# Patient Record
Sex: Female | Born: 1955 | Race: White | Hispanic: No | Smoking: Former smoker
Health system: Southern US, Community
[De-identification: ages and names within clinical notes are randomized; demographics above are authoritative.]

## PROBLEM LIST (undated history)

## (undated) DIAGNOSIS — I714 Abdominal aortic aneurysm, without rupture, unspecified: Secondary | ICD-10-CM

## (undated) DIAGNOSIS — I200 Unstable angina: Secondary | ICD-10-CM

## (undated) DIAGNOSIS — I5189 Other ill-defined heart diseases: Secondary | ICD-10-CM

## (undated) DIAGNOSIS — R112 Nausea with vomiting, unspecified: Secondary | ICD-10-CM

## (undated) DIAGNOSIS — I2510 Atherosclerotic heart disease of native coronary artery without angina pectoris: Secondary | ICD-10-CM

## (undated) DIAGNOSIS — R590 Localized enlarged lymph nodes: Secondary | ICD-10-CM

## (undated) DIAGNOSIS — G43909 Migraine, unspecified, not intractable, without status migrainosus: Secondary | ICD-10-CM

## (undated) DIAGNOSIS — M51369 Other intervertebral disc degeneration, lumbar region without mention of lumbar back pain or lower extremity pain: Secondary | ICD-10-CM

## (undated) DIAGNOSIS — T8859XA Other complications of anesthesia, initial encounter: Secondary | ICD-10-CM

## (undated) DIAGNOSIS — J449 Chronic obstructive pulmonary disease, unspecified: Secondary | ICD-10-CM

## (undated) DIAGNOSIS — Z955 Presence of coronary angioplasty implant and graft: Secondary | ICD-10-CM

## (undated) DIAGNOSIS — Z7982 Long term (current) use of aspirin: Secondary | ICD-10-CM

## (undated) DIAGNOSIS — M5126 Other intervertebral disc displacement, lumbar region: Secondary | ICD-10-CM

## (undated) DIAGNOSIS — C3490 Malignant neoplasm of unspecified part of unspecified bronchus or lung: Secondary | ICD-10-CM

## (undated) DIAGNOSIS — I719 Aortic aneurysm of unspecified site, without rupture: Secondary | ICD-10-CM

## (undated) DIAGNOSIS — I6523 Occlusion and stenosis of bilateral carotid arteries: Secondary | ICD-10-CM

## (undated) DIAGNOSIS — K5792 Diverticulitis of intestine, part unspecified, without perforation or abscess without bleeding: Secondary | ICD-10-CM

## (undated) DIAGNOSIS — Z87442 Personal history of urinary calculi: Secondary | ICD-10-CM

## (undated) DIAGNOSIS — F988 Other specified behavioral and emotional disorders with onset usually occurring in childhood and adolescence: Secondary | ICD-10-CM

## (undated) DIAGNOSIS — D472 Monoclonal gammopathy: Secondary | ICD-10-CM

## (undated) DIAGNOSIS — I471 Supraventricular tachycardia, unspecified: Secondary | ICD-10-CM

## (undated) DIAGNOSIS — Z9889 Other specified postprocedural states: Secondary | ICD-10-CM

## (undated) DIAGNOSIS — I700 Atherosclerosis of aorta: Secondary | ICD-10-CM

## (undated) DIAGNOSIS — I10 Essential (primary) hypertension: Secondary | ICD-10-CM

## (undated) DIAGNOSIS — M810 Age-related osteoporosis without current pathological fracture: Secondary | ICD-10-CM

## (undated) DIAGNOSIS — R918 Other nonspecific abnormal finding of lung field: Secondary | ICD-10-CM

## (undated) DIAGNOSIS — F32A Depression, unspecified: Secondary | ICD-10-CM

## (undated) DIAGNOSIS — I519 Heart disease, unspecified: Secondary | ICD-10-CM

## (undated) DIAGNOSIS — E785 Hyperlipidemia, unspecified: Secondary | ICD-10-CM

## (undated) DIAGNOSIS — R55 Syncope and collapse: Secondary | ICD-10-CM

## (undated) DIAGNOSIS — F419 Anxiety disorder, unspecified: Secondary | ICD-10-CM

## (undated) DIAGNOSIS — C801 Malignant (primary) neoplasm, unspecified: Secondary | ICD-10-CM

## (undated) DIAGNOSIS — K739 Chronic hepatitis, unspecified: Secondary | ICD-10-CM

## (undated) DIAGNOSIS — Z7901 Long term (current) use of anticoagulants: Secondary | ICD-10-CM

## (undated) DIAGNOSIS — I739 Peripheral vascular disease, unspecified: Secondary | ICD-10-CM

## (undated) DIAGNOSIS — I219 Acute myocardial infarction, unspecified: Secondary | ICD-10-CM

## (undated) DIAGNOSIS — Z87891 Personal history of nicotine dependence: Secondary | ICD-10-CM

## (undated) DIAGNOSIS — I7401 Saddle embolus of abdominal aorta: Secondary | ICD-10-CM

## (undated) DIAGNOSIS — R0600 Dyspnea, unspecified: Secondary | ICD-10-CM

## (undated) DIAGNOSIS — M5136 Other intervertebral disc degeneration, lumbar region: Secondary | ICD-10-CM

## (undated) DIAGNOSIS — G894 Chronic pain syndrome: Secondary | ICD-10-CM

## (undated) DIAGNOSIS — G4700 Insomnia, unspecified: Secondary | ICD-10-CM

## (undated) DIAGNOSIS — Z972 Presence of dental prosthetic device (complete) (partial): Secondary | ICD-10-CM

## (undated) DIAGNOSIS — F319 Bipolar disorder, unspecified: Secondary | ICD-10-CM

## (undated) DIAGNOSIS — F329 Major depressive disorder, single episode, unspecified: Secondary | ICD-10-CM

## (undated) DIAGNOSIS — M47816 Spondylosis without myelopathy or radiculopathy, lumbar region: Secondary | ICD-10-CM

## (undated) HISTORY — DX: Major depressive disorder, single episode, unspecified: F32.9

## (undated) HISTORY — PX: COLONOSCOPY: SHX174

## (undated) HISTORY — DX: Anxiety disorder, unspecified: F41.9

## (undated) HISTORY — PX: OTHER SURGICAL HISTORY: SHX169

## (undated) HISTORY — PX: CORONARY ANGIOPLASTY WITH STENT PLACEMENT: SHX49

## (undated) HISTORY — DX: Diverticulitis of intestine, part unspecified, without perforation or abscess without bleeding: K57.92

## (undated) HISTORY — DX: Heart disease, unspecified: I51.9

## (undated) HISTORY — PX: ABDOMINAL HYSTERECTOMY: SHX81

## (undated) HISTORY — DX: Depression, unspecified: F32.A

---

## 2006-09-21 DIAGNOSIS — I2119 ST elevation (STEMI) myocardial infarction involving other coronary artery of inferior wall: Secondary | ICD-10-CM

## 2006-09-21 HISTORY — DX: ST elevation (STEMI) myocardial infarction involving other coronary artery of inferior wall: I21.19

## 2006-09-21 HISTORY — PX: CORONARY ANGIOPLASTY WITH STENT PLACEMENT: SHX49

## 2008-06-17 HISTORY — PX: LEFT HEART CATH AND CORONARY ANGIOGRAPHY: CATH118249

## 2011-08-09 DIAGNOSIS — E785 Hyperlipidemia, unspecified: Secondary | ICD-10-CM | POA: Insufficient documentation

## 2011-08-09 DIAGNOSIS — E782 Mixed hyperlipidemia: Secondary | ICD-10-CM | POA: Insufficient documentation

## 2011-08-11 DIAGNOSIS — Z598 Other problems related to housing and economic circumstances: Secondary | ICD-10-CM | POA: Insufficient documentation

## 2011-08-11 DIAGNOSIS — Z560 Unemployment, unspecified: Secondary | ICD-10-CM | POA: Insufficient documentation

## 2011-08-11 DIAGNOSIS — N200 Calculus of kidney: Secondary | ICD-10-CM | POA: Insufficient documentation

## 2011-08-11 DIAGNOSIS — Z5989 Other problems related to housing and economic circumstances: Secondary | ICD-10-CM | POA: Insufficient documentation

## 2011-09-14 ENCOUNTER — Ambulatory Visit: Payer: Self-pay

## 2011-11-15 DIAGNOSIS — I10 Essential (primary) hypertension: Secondary | ICD-10-CM | POA: Insufficient documentation

## 2012-01-03 DIAGNOSIS — R079 Chest pain, unspecified: Secondary | ICD-10-CM | POA: Insufficient documentation

## 2012-01-03 DIAGNOSIS — R42 Dizziness and giddiness: Secondary | ICD-10-CM | POA: Insufficient documentation

## 2012-01-16 ENCOUNTER — Ambulatory Visit: Payer: Self-pay | Admitting: Internal Medicine

## 2012-01-30 ENCOUNTER — Ambulatory Visit: Payer: Self-pay | Admitting: Urology

## 2012-03-04 DIAGNOSIS — K738 Other chronic hepatitis, not elsewhere classified: Secondary | ICD-10-CM | POA: Insufficient documentation

## 2012-05-26 ENCOUNTER — Ambulatory Visit: Payer: Self-pay | Admitting: Internal Medicine

## 2012-05-26 LAB — URINALYSIS, COMPLETE
Bacteria: NEGATIVE
Bilirubin,UR: NEGATIVE
Glucose,UR: NEGATIVE mg/dL (ref 0–75)
Nitrite: NEGATIVE
Ph: 6.5 (ref 4.5–8.0)
Specific Gravity: 1.02 (ref 1.003–1.030)

## 2012-05-26 LAB — WET PREP, GENITAL

## 2012-05-28 LAB — URINE CULTURE

## 2012-07-14 ENCOUNTER — Ambulatory Visit: Payer: Self-pay

## 2013-06-02 ENCOUNTER — Ambulatory Visit: Payer: Self-pay | Admitting: Family Medicine

## 2013-06-02 LAB — URINALYSIS, COMPLETE
Bilirubin,UR: NEGATIVE
Glucose,UR: NEGATIVE mg/dL (ref 0–75)
Ketone: NEGATIVE
Nitrite: NEGATIVE
Ph: 5 (ref 4.5–8.0)
Protein: NEGATIVE
Specific Gravity: 1.01 (ref 1.003–1.030)
WBC UR: >30 /HPF (ref 0–5)

## 2013-06-04 LAB — URINE CULTURE

## 2013-06-23 ENCOUNTER — Ambulatory Visit: Payer: Self-pay | Admitting: Internal Medicine

## 2013-10-03 ENCOUNTER — Ambulatory Visit: Payer: Self-pay | Admitting: Family Medicine

## 2014-07-21 ENCOUNTER — Ambulatory Visit: Payer: Self-pay | Admitting: Internal Medicine

## 2015-02-19 ENCOUNTER — Ambulatory Visit
Admit: 2015-02-19 | Disposition: A | Discharge status: Home / Self Care | Payer: Self-pay | Attending: Internal Medicine | Admitting: Internal Medicine

## 2015-03-24 DIAGNOSIS — R55 Syncope and collapse: Secondary | ICD-10-CM | POA: Insufficient documentation

## 2015-08-08 ENCOUNTER — Other Ambulatory Visit: Payer: Self-pay | Admitting: Internal Medicine

## 2015-08-08 DIAGNOSIS — Z1231 Encounter for screening mammogram for malignant neoplasm of breast: Secondary | ICD-10-CM

## 2015-08-11 ENCOUNTER — Ambulatory Visit: Payer: Self-pay

## 2015-08-11 ENCOUNTER — Other Ambulatory Visit: Payer: Self-pay | Admitting: Internal Medicine

## 2015-08-11 ENCOUNTER — Ambulatory Visit
Admission: RE | Admit: 2015-08-11 | Discharge: 2015-08-11 | Disposition: A | Discharge status: Home / Self Care | Payer: Medicare Other | Source: Ambulatory Visit | Attending: Internal Medicine | Admitting: Internal Medicine

## 2015-08-11 DIAGNOSIS — Z1239 Encounter for other screening for malignant neoplasm of breast: Secondary | ICD-10-CM

## 2015-08-11 DIAGNOSIS — Z1231 Encounter for screening mammogram for malignant neoplasm of breast: Secondary | ICD-10-CM | POA: Diagnosis not present

## 2015-08-12 ENCOUNTER — Other Ambulatory Visit: Payer: Self-pay | Admitting: Internal Medicine

## 2015-08-12 DIAGNOSIS — Z1231 Encounter for screening mammogram for malignant neoplasm of breast: Secondary | ICD-10-CM

## 2015-10-10 DIAGNOSIS — G4700 Insomnia, unspecified: Secondary | ICD-10-CM | POA: Insufficient documentation

## 2015-10-10 DIAGNOSIS — E7800 Pure hypercholesterolemia, unspecified: Secondary | ICD-10-CM | POA: Insufficient documentation

## 2015-10-10 DIAGNOSIS — Z955 Presence of coronary angioplasty implant and graft: Secondary | ICD-10-CM | POA: Insufficient documentation

## 2015-10-10 DIAGNOSIS — J432 Centrilobular emphysema: Secondary | ICD-10-CM | POA: Insufficient documentation

## 2015-10-10 DIAGNOSIS — F319 Bipolar disorder, unspecified: Secondary | ICD-10-CM | POA: Insufficient documentation

## 2015-10-10 DIAGNOSIS — F5101 Primary insomnia: Secondary | ICD-10-CM | POA: Insufficient documentation

## 2015-10-18 DIAGNOSIS — K130 Diseases of lips: Secondary | ICD-10-CM | POA: Insufficient documentation

## 2015-12-06 DIAGNOSIS — F411 Generalized anxiety disorder: Secondary | ICD-10-CM | POA: Insufficient documentation

## 2016-10-14 ENCOUNTER — Emergency Department: Payer: Medicare Other

## 2016-10-14 ENCOUNTER — Emergency Department
Admission: EM | Admit: 2016-10-14 | Discharge: 2016-10-14 | Disposition: A | Discharge status: Home / Self Care | Payer: Medicare Other | Attending: Student in an Organized Health Care Education/Training Program | Admitting: Student in an Organized Health Care Education/Training Program

## 2016-10-14 ENCOUNTER — Encounter: Payer: Self-pay | Admitting: Emergency Medicine

## 2016-10-14 DIAGNOSIS — Z7982 Long term (current) use of aspirin: Secondary | ICD-10-CM | POA: Diagnosis not present

## 2016-10-14 DIAGNOSIS — Z79899 Other long term (current) drug therapy: Secondary | ICD-10-CM | POA: Diagnosis not present

## 2016-10-14 DIAGNOSIS — Z955 Presence of coronary angioplasty implant and graft: Secondary | ICD-10-CM | POA: Diagnosis not present

## 2016-10-14 DIAGNOSIS — F17290 Nicotine dependence, other tobacco product, uncomplicated: Secondary | ICD-10-CM | POA: Insufficient documentation

## 2016-10-14 DIAGNOSIS — R079 Chest pain, unspecified: Secondary | ICD-10-CM | POA: Diagnosis not present

## 2016-10-14 DIAGNOSIS — R0789 Other chest pain: Secondary | ICD-10-CM | POA: Diagnosis present

## 2016-10-14 HISTORY — DX: Acute myocardial infarction, unspecified: I21.9

## 2016-10-14 HISTORY — DX: Hyperlipidemia, unspecified: E78.5

## 2016-10-14 HISTORY — DX: Bipolar disorder, unspecified: F31.9

## 2016-10-14 LAB — CBC WITH DIFFERENTIAL/PLATELET
Basophils Absolute: 0.1 10*3/uL (ref 0–0.1)
Basophils Relative: 1 %
Eosinophils Absolute: 0.2 10*3/uL (ref 0–0.7)
Eosinophils Relative: 2 %
HCT: 41.7 % (ref 35.0–47.0)
Hemoglobin: 14.4 g/dL (ref 12.0–16.0)
Lymphocytes Relative: 17 %
Lymphs Abs: 1.6 10*3/uL (ref 1.0–3.6)
MCH: 30.8 pg (ref 26.0–34.0)
MCHC: 34.6 g/dL (ref 32.0–36.0)
MCV: 89.2 fL (ref 80.0–100.0)
Monocytes Absolute: 0.7 10*3/uL (ref 0.2–0.9)
Monocytes Relative: 7 %
Neutro Abs: 6.9 10*3/uL — ABNORMAL HIGH (ref 1.4–6.5)
Neutrophils Relative %: 73 %
Platelets: 236 10*3/uL (ref 150–440)
RBC: 4.68 MIL/uL (ref 3.80–5.20)
RDW: 12.8 % (ref 11.5–14.5)
WBC: 9.5 10*3/uL (ref 3.6–11.0)

## 2016-10-14 LAB — COMPREHENSIVE METABOLIC PANEL
ALT: 14 U/L (ref 14–54)
AST: 20 U/L (ref 15–41)
Albumin: 3.8 g/dL (ref 3.5–5.0)
Alkaline Phosphatase: 123 U/L (ref 38–126)
Anion gap: 8 (ref 5–15)
BUN: 24 mg/dL — ABNORMAL HIGH (ref 6–20)
CO2: 27 mmol/L (ref 22–32)
Calcium: 8.9 mg/dL (ref 8.9–10.3)
Chloride: 104 mmol/L (ref 101–111)
Creatinine, Ser: 0.94 mg/dL (ref 0.44–1.00)
GFR calc Af Amer: >60 mL/min (ref >60)
GFR calc non Af Amer: >60 mL/min (ref >60)
Glucose, Bld: 98 mg/dL (ref 65–99)
Potassium: 4 mmol/L (ref 3.5–5.1)
Sodium: 139 mmol/L (ref 135–145)
Total Bilirubin: 0.4 mg/dL (ref 0.3–1.2)
Total Protein: 6.8 g/dL (ref 6.5–8.1)

## 2016-10-14 LAB — TROPONIN I
Troponin I: <0.03 ng/mL (ref <0.03)
Troponin I: <0.03 ng/mL (ref <0.03)

## 2016-10-14 LAB — FIBRIN DERIVATIVES D-DIMER (ARMC ONLY): Fibrin derivatives D-dimer (ARMC): 949 — ABNORMAL HIGH (ref 0–499)

## 2016-10-14 MED ORDER — ISOSORBIDE MONONITRATE ER 30 MG PO TB24: 30.0000 mg | ORAL_TABLET | Freq: Every day | ORAL | 0 refills | Status: DC

## 2016-10-14 MED ORDER — FENTANYL CITRATE (PF) 100 MCG/2ML IJ SOLN
50.0000 ug | INTRAMUSCULAR | Status: DC | PRN
  Administered 2016-10-14: 50 ug via INTRAVENOUS
  Filled 2016-10-14: qty 2

## 2016-10-14 MED ORDER — CLOPIDOGREL BISULFATE 75 MG PO TABS: 75.0000 mg | ORAL_TABLET | Freq: Every day | ORAL | 0 refills | Status: AC

## 2016-10-14 MED ORDER — CLOPIDOGREL BISULFATE 75 MG PO TABS
300.0000 mg | ORAL_TABLET | Freq: Every day | ORAL | Status: DC
  Administered 2016-10-14: 300 mg via ORAL
  Filled 2016-10-14: qty 4

## 2016-10-14 MED ORDER — IOPAMIDOL (ISOVUE-370) INJECTION 76%
60.0000 mL | Freq: Once | INTRAVENOUS | Status: AC | PRN
  Administered 2016-10-14: 60 mL via INTRAVENOUS

## 2016-10-14 MED ORDER — ISOSORBIDE DINITRATE 30 MG PO TABS
30.0000 mg | ORAL_TABLET | Freq: Once | ORAL | Status: AC
  Administered 2016-10-14: 30 mg via ORAL
  Filled 2016-10-14: qty 1

## 2016-10-14 NOTE — ED Triage Notes (Signed)
Patient to ER for left sided chest pain that radiates to neck and back. Patient reports history of 100% blockage to RCA with stent placement with same symptoms. 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

## 2016-10-14 NOTE — Discharge Instructions (Signed)
Return to ER for any increase in pain, if the pain changes or becomes worse with physical activity, you have shortness of breath, nausea or vomiting associated with the chest pain. ° °

## 2016-10-14 NOTE — ED Provider Notes (Signed)
[facility] Emergency Department Provider Note    First MD Initiated Contact with Patient 10/14/16 1123     (approximate)  I have reviewed the triage vital signs and the nursing notes.   HISTORY  Chief Complaint Chest Pain    HPI Melissa Montes is a 60 y.o. female with history of MI roughly 10 years ago as well as bipolar disorder presents with acute left-sided chest pain that radiates to her neck and shoulder. Patient awoke with this pain. States that it is aching and pressure in nature. She took one nitroglycerin with some improvement in her symptoms. States is identical to previous time that she was admitted for heart attack. Denies any diaphoresis. No nausea or vomiting. No lower STEMI swelling. States her chest pain at this moment is 2 out of 10 in severity. She took 4 days aspirin prior to arriving.   Past Medical History:  Diagnosis Date  . Bipolar disorder ([place])   . Hyperlipidemia   . MI (myocardial infarction)    Family History  Problem Relation Age of Onset  . Breast cancer Mother 32  . Breast cancer Maternal Aunt     mat aunt and great aunt   Past Surgical History:  Procedure Laterality Date  . CORONARY ANGIOPLASTY WITH STENT PLACEMENT     There are no active problems to display for this patient.     Prior to Admission medications   Medication Sig Start Date End Date Taking? Authorizing Provider  aspirin EC 81 MG tablet Take 81 mg by mouth daily.   Yes Historical Provider, MD  atorvastatin (LIPITOR) 80 MG tablet Take 80 mg by mouth daily.  08/30/16  Yes Historical Provider, MD  carbamazepine (TEGRETOL) 200 MG tablet Take 200 mg by mouth daily.  09/22/16  Yes Historical Provider, MD  ezetimibe (ZETIA) 10 MG tablet  09/24/16  Yes Historical Provider, MD  FLUoxetine (PROZAC) 40 MG capsule Take 40 mg by mouth daily.  11/29/15  Yes Historical Provider, MD  Multiple Vitamin (MULTIVITAMIN) tablet Take 1 tablet by mouth daily.   Yes  Historical Provider, MD  MYRBETRIQ 50 MG TB24 tablet Take 50 mg by mouth daily.  09/14/16  Yes Historical Provider, MD  traZODone (DESYREL) 100 MG tablet Take by mouth at bedtime.  08/28/16  Yes Historical Provider, MD  clopidogrel (PLAVIX) 75 MG tablet Take 1 tablet (75 mg total) by mouth daily. 10/14/16 10/14/17  Merlyn Lot, MD  isosorbide mononitrate (IMDUR) 30 MG 24 hr tablet Take 1 tablet (30 mg total) by mouth daily. 10/14/16 10/14/17  Merlyn Lot, MD  nitroGLYCERIN (NITROSTAT) 0.4 MG SL tablet  10/06/16   Historical Provider, MD    Allergies Keflex [cephalexin]    Social History Social History  Substance Use Topics  . Smoking status: Current Every Day Smoker  . Smokeless tobacco: Current User  . Alcohol use No    Review of Systems Patient denies headaches, rhinorrhea, blurry vision, numbness, shortness of breath, chest pain, edema, cough, abdominal pain, nausea, vomiting, diarrhea, dysuria, fevers, rashes or hallucinations unless otherwise stated above in HPI. ____________________________________________   PHYSICAL EXAM:  VITAL SIGNS: Vitals:   10/14/16 1430 10/14/16 1617  BP: 133/77 118/78  Pulse: (!) 55 82  Resp: (!) 23 18  Temp:      Constitutional: Alert and oriented. Well appearing and in no acute distress. Eyes: Conjunctivae are normal. PERRL. EOMI. Head: Atraumatic. Nose: No congestion/rhinnorhea. Mouth/Throat: Mucous membranes are moist.  Oropharynx non-erythematous. Neck: No stridor.  Painless ROM. No cervical spine tenderness to palpation Hematological/Lymphatic/Immunilogical: No cervical lymphadenopathy. Cardiovascular: Normal rate, regular rhythm. Grossly normal heart sounds.  Good peripheral circulation. Respiratory: Normal respiratory effort.  No retractions. Lungs CTAB. Gastrointestinal: Soft and nontender. No distention. No abdominal bruits. No CVA tenderness. Genitourinary:  Musculoskeletal: No lower extremity tenderness nor edema.  No  joint effusions. Neurologic:  Normal speech and language. No gross focal neurologic deficits are appreciated. No gait instability. Skin:  Skin is warm, dry and intact. No rash noted. Psychiatric: anxious appearing ____________________________________________   LABS (all labs ordered are listed, but only abnormal results are displayed)  Results for orders placed or performed during the hospital encounter of 10/14/16 (from the past 24 hour(s))  CBC with Differential/Platelet     Status: Abnormal   Collection Time: 10/14/16 11:36 AM  Result Value Ref Range   WBC 9.5 3.6 - 11.0 K/uL   RBC 4.68 3.80 - 5.20 MIL/uL   Hemoglobin 14.4 12.0 - 16.0 g/dL   HCT 41.7 35.0 - 47.0 %   MCV 89.2 80.0 - 100.0 fL   MCH 30.8 26.0 - 34.0 pg   MCHC 34.6 32.0 - 36.0 g/dL   RDW 12.8 11.5 - 14.5 %   Platelets 236 150 - 440 K/uL   Neutrophils Relative % 73 %   Neutro Abs 6.9 (H) 1.4 - 6.5 K/uL   Lymphocytes Relative 17 %   Lymphs Abs 1.6 1.0 - 3.6 K/uL   Monocytes Relative 7 %   Monocytes Absolute 0.7 0.2 - 0.9 K/uL   Eosinophils Relative 2 %   Eosinophils Absolute 0.2 0 - 0.7 K/uL   Basophils Relative 1 %   Basophils Absolute 0.1 0 - 0.1 K/uL  Comprehensive metabolic panel     Status: Abnormal   Collection Time: 10/14/16 11:36 AM  Result Value Ref Range   Sodium 139 135 - 145 mmol/L   Potassium 4.0 3.5 - 5.1 mmol/L   Chloride 104 101 - 111 mmol/L   CO2 27 22 - 32 mmol/L   Glucose, Bld 98 65 - 99 mg/dL   BUN 24 (H) 6 - 20 mg/dL   Creatinine, Ser 0.94 0.44 - 1.00 mg/dL   Calcium 8.9 8.9 - 10.3 mg/dL   Total Protein 6.8 6.5 - 8.1 g/dL   Albumin 3.8 3.5 - 5.0 g/dL   AST 20 15 - 41 U/L   ALT 14 14 - 54 U/L   Alkaline Phosphatase 123 38 - 126 U/L   Total Bilirubin 0.4 0.3 - 1.2 mg/dL   GFR calc non Af Amer >60 >60 mL/min   GFR calc Af Amer >60 >60 mL/min   Anion gap 8 5 - 15  Troponin I     Status: None   Collection Time: 10/14/16 11:36 AM  Result Value Ref Range   Troponin I <0.03 <0.03 ng/mL   Fibrin derivatives D-Dimer ([facility] only)     Status: Abnormal   Collection Time: 10/14/16  1:31 PM  Result Value Ref Range   Fibrin derivatives D-dimer ([facility]) 949 (H) 0 - 499  Troponin I     Status: None   Collection Time: 10/14/16  3:46 PM  Result Value Ref Range   Troponin I <0.03 <0.03 ng/mL   ____________________________________________  EKG My review and personal interpretation at Time: 11:22   Indication: chest pain  Rate: 80  Rhythm: sinus Axis: normal Other: non specific st changes, normal intervals ____________________________________________  RADIOLOGY  I personally reviewed all radiographic images ordered to  evaluate for the above acute complaints and reviewed radiology reports and findings.  These findings were personally discussed with the patient.  Please see medical record for radiology report.  ____________________________________________   PROCEDURES  Procedure(s) performed: none Procedures    Critical Care performed: no ____________________________________________   INITIAL IMPRESSION / ASSESSMENT AND PLAN / ED COURSE  Pertinent labs & imaging results that were available during my care of the patient were reviewed by me and considered in my medical decision making (see chart for details).  DDX: ACS, pericarditis, esophagitis, boerhaaves, pe, dissection, pna, bronchitis, costochondritis   Abelina Warnell is a 60 y.o. who presents to the ED with acute chest pain this Am.  Patient is AFVSS in ED. Exam as above. Given current presentation have considered the above differential.  Patient states chest pain similar to previous back pain is already improving upon arrival to the ER. EKG shows no evidence of acute ischemia. Initial troponin is negative. Blood work otherwise reassuring. Chest x-ray shows no evidence of acute abnormality. Patient is low-risk [place] therefore we'll further risk stratify would d-dimer. D-dimer was elevated therefore a CT imaging  ordered due to concern for PE.  Clinical Course as of Oct 14 1708  Nancy Fetter Oct 14, 2016  1534 CT angiographic without any evidence of pulmonary embolism. I discussed the results of nodules that will require follow-up. Patient remains chest pain-free. I spoke with Dr. Carren Rang regarding the patient's presentation. She is currently chest pain-free with negative troponin and EKG Dr. Chancy Milroy is recommended restarting the patient's Plavix, Isosorbide.  Currently awaiting repeat troponin.  [PR]  1556 Patient reassessed and remains chest pain-free. Hemodynamic stable. Patient received Plavix per Dr. Ruben Gottron recommendation. She is able to follow up with him in clinic tomorrow morning.  Have discussed with the patient and available family all diagnostics and treatments performed thus far and all questions were answered to the best of my ability. The patient demonstrates understanding and agreement with plan.   [PR]    Clinical Course User Index [PR] Merlyn Lot, MD   ----------------------------------------- 5:11 PM on 10/14/2016 -----------------------------------------  Repeat troponin negative. Patient stable for outpatient follow-up tomorrow morning.  ____________________________________________   FINAL CLINICAL IMPRESSION(S) / ED DIAGNOSES  Final diagnoses:  Chest pain, unspecified type      NEW MEDICATIONS STARTED DURING THIS VISIT:  New Prescriptions   CLOPIDOGREL (PLAVIX) 75 MG TABLET    Take 1 tablet (75 mg total) by mouth daily.   ISOSORBIDE MONONITRATE (IMDUR) 30 MG 24 HR TABLET    Take 1 tablet (30 mg total) by mouth daily.     Note:  This document was prepared using Dragon voice recognition software and may include unintentional dictation errors.    Merlyn Lot, MD 10/14/16 [phone]

## 2016-10-26 DIAGNOSIS — Z8709 Personal history of other diseases of the respiratory system: Secondary | ICD-10-CM | POA: Insufficient documentation

## 2017-03-19 ENCOUNTER — Other Ambulatory Visit: Payer: Self-pay | Admitting: Internal Medicine

## 2017-03-19 DIAGNOSIS — Z1231 Encounter for screening mammogram for malignant neoplasm of breast: Secondary | ICD-10-CM

## 2017-03-27 ENCOUNTER — Ambulatory Visit
Admission: RE | Admit: 2017-03-27 | Discharge: 2017-03-27 | Disposition: A | Discharge status: Home / Self Care | Payer: Medicare Other | Source: Ambulatory Visit | Attending: Internal Medicine | Admitting: Internal Medicine

## 2017-03-27 DIAGNOSIS — Z1231 Encounter for screening mammogram for malignant neoplasm of breast: Secondary | ICD-10-CM | POA: Insufficient documentation

## 2017-05-30 ENCOUNTER — Other Ambulatory Visit: Payer: Self-pay | Admitting: Internal Medicine

## 2017-05-30 DIAGNOSIS — K8020 Calculus of gallbladder without cholecystitis without obstruction: Secondary | ICD-10-CM

## 2017-05-30 DIAGNOSIS — R1011 Right upper quadrant pain: Secondary | ICD-10-CM

## 2017-06-05 ENCOUNTER — Other Ambulatory Visit: Payer: Self-pay

## 2017-06-07 ENCOUNTER — Ambulatory Visit: Payer: Self-pay

## 2017-06-13 ENCOUNTER — Encounter
Admission: RE | Admit: 2017-06-13 | Discharge: 2017-06-13 | Disposition: A | Discharge status: Home / Self Care | Payer: Medicare Other | Source: Ambulatory Visit | Attending: Internal Medicine | Admitting: Internal Medicine

## 2017-06-13 DIAGNOSIS — R1011 Right upper quadrant pain: Secondary | ICD-10-CM | POA: Insufficient documentation

## 2017-06-13 DIAGNOSIS — K8020 Calculus of gallbladder without cholecystitis without obstruction: Secondary | ICD-10-CM | POA: Diagnosis present

## 2017-06-13 MED ORDER — TECHNETIUM TC 99M MEBROFENIN IV KIT
5.0000 | PACK | Freq: Once | INTRAVENOUS | Status: AC | PRN
  Administered 2017-06-13: 5.12 via INTRAVENOUS

## 2017-06-18 ENCOUNTER — Ambulatory Visit (INDEPENDENT_AMBULATORY_CARE_PROVIDER_SITE_OTHER): Payer: Medicare Other | Admitting: Urology

## 2017-06-18 ENCOUNTER — Encounter: Payer: Self-pay | Admitting: Urology

## 2017-06-18 VITALS — BP 92/57 | HR 76 | Ht 62.0 in | Wt 101.0 lb

## 2017-06-18 DIAGNOSIS — M8580 Other specified disorders of bone density and structure, unspecified site: Secondary | ICD-10-CM | POA: Insufficient documentation

## 2017-06-18 DIAGNOSIS — I7409 Other arterial embolism and thrombosis of abdominal aorta: Secondary | ICD-10-CM | POA: Insufficient documentation

## 2017-06-18 DIAGNOSIS — I700 Atherosclerosis of aorta: Secondary | ICD-10-CM | POA: Insufficient documentation

## 2017-06-18 DIAGNOSIS — R3129 Other microscopic hematuria: Secondary | ICD-10-CM | POA: Diagnosis not present

## 2017-06-18 DIAGNOSIS — Z87891 Personal history of nicotine dependence: Secondary | ICD-10-CM | POA: Insufficient documentation

## 2017-06-18 DIAGNOSIS — N200 Calculus of kidney: Secondary | ICD-10-CM

## 2017-06-18 DIAGNOSIS — I471 Supraventricular tachycardia: Secondary | ICD-10-CM | POA: Insufficient documentation

## 2017-06-18 DIAGNOSIS — R350 Frequency of micturition: Secondary | ICD-10-CM | POA: Diagnosis not present

## 2017-06-18 DIAGNOSIS — F988 Other specified behavioral and emotional disorders with onset usually occurring in childhood and adolescence: Secondary | ICD-10-CM | POA: Insufficient documentation

## 2017-06-18 DIAGNOSIS — I739 Peripheral vascular disease, unspecified: Secondary | ICD-10-CM | POA: Insufficient documentation

## 2017-06-18 DIAGNOSIS — F17200 Nicotine dependence, unspecified, uncomplicated: Secondary | ICD-10-CM

## 2017-06-18 DIAGNOSIS — I2510 Atherosclerotic heart disease of native coronary artery without angina pectoris: Secondary | ICD-10-CM | POA: Insufficient documentation

## 2017-06-18 DIAGNOSIS — J449 Chronic obstructive pulmonary disease, unspecified: Secondary | ICD-10-CM | POA: Insufficient documentation

## 2017-06-18 DIAGNOSIS — B002 Herpesviral gingivostomatitis and pharyngotonsillitis: Secondary | ICD-10-CM | POA: Insufficient documentation

## 2017-06-18 LAB — URINALYSIS, COMPLETE
Bilirubin, UA: NEGATIVE
Glucose, UA: NEGATIVE
Ketones, UA: NEGATIVE
Leukocytes, UA: NEGATIVE
Nitrite, UA: NEGATIVE
Protein, UA: NEGATIVE
Specific Gravity, UA: 1.015 (ref 1.005–1.030)
Urobilinogen, Ur: 0.2 mg/dL (ref 0.2–1.0)
pH, UA: 6 (ref 5.0–7.5)

## 2017-06-18 LAB — MICROSCOPIC EXAMINATION

## 2017-06-18 NOTE — Progress Notes (Signed)
06/18/2017 2:47 PM   Melissa Montes March 05, 1956 [id number]  Referring provider: Perrin Maltese, MD [place], [place] [address]  Chief Complaint  Patient presents with  . Nephrolithiasis    New Patient    HPI: 61 year old female who presents today for further evaluation of an incidental 4 mm renal stone as well as possible microscopic hematuria.  She is been having issues with abdominal pain, diffuse along with nausea and vomiting over the past month. As part of her workup, she underwent a CT abdomen pelvis with contrast. This revealed an incidental 4 mm left lower pole nonobstructing calculus.  She denies any flank pain or gross hematuria.    She has a personal history of kidney stones, ~ 15 years ago which passed spontaneously.  No previous surgical intervention for stones.    Per her PCP note, she also had 2+ blood on her most recent data. It does not appear that her microscopic exam was done of her urine. She was started on Macrobid 100 mg twice daily for 7 days for unclear reasons that she was asymptomatic without evidence of a UTI. She just completed this course.  +smoker, 30 years between 6 cigs (currently) to 1 ppd  She does have urinary frequency.  She denies any significant urgency. She does drink at least 4 cups of coffee in the morning which exacerbates her symptoms.  Previously history of Mybetriq 50 mg which did not help much with her symptoms.  No incontinence.     PMH: Past Medical History:  Diagnosis Date  . Anxiety   . Bipolar disorder ([place])   . Depression   . Heart disease   . Hyperlipidemia   . MI (myocardial infarction) [facility])     Surgical History: Past Surgical History:  Procedure Laterality Date  . ABDOMINAL HYSTERECTOMY    . CORONARY ANGIOPLASTY WITH STENT PLACEMENT      Home Medications:  Allergies as of 06/18/2017      Reactions   Keflex [cephalexin] Hives      Medication List       Accurate as of 06/18/17  2:47 PM.  Always use your most recent med list.          aspirin EC 81 MG tablet Take 81 mg by mouth daily.   atorvastatin 80 MG tablet Commonly known as:  LIPITOR Take 80 mg by mouth daily.   busPIRone 7.5 MG tablet Commonly known as:  BUSPAR TAKE 1 TABLET BY MOUTH TWICE DAILY   carbamazepine 200 MG tablet Commonly known as:  TEGRETOL Take 200 mg by mouth daily.   clopidogrel 75 MG tablet Commonly known as:  PLAVIX Take 1 tablet (75 mg total) by mouth daily.   ezetimibe 10 MG tablet Commonly known as:  ZETIA   FLUoxetine 40 MG capsule Commonly known as:  PROZAC Take 40 mg by mouth daily.   metoprolol succinate 25 MG 24 hr tablet Commonly known as:  TOPROL-XL   multivitamin tablet Take 1 tablet by mouth daily.   MYRBETRIQ 50 MG Tb24 tablet Generic drug:  mirabegron ER Take 50 mg by mouth daily.   nitroGLYCERIN 0.4 MG SL tablet Commonly known as:  NITROSTAT   ondansetron 4 MG tablet Commonly known as:  ZOFRAN   pantoprazole 40 MG tablet Commonly known as:  PROTONIX   traZODone 100 MG tablet Commonly known as:  DESYREL       Allergies:  Allergies  Allergen Reactions  . Keflex [Cephalexin] Hives  Family History: Family History  Problem Relation Age of Onset  . Breast cancer Mother 36  . Breast cancer Maternal Aunt        mat aunt and great aunt    Social History:  reports that she has been smoking Cigarettes.  She has been smoking about 0.50 packs per day. She has never used smokeless tobacco. She reports that she does not drink alcohol or use drugs.  ROS: UROLOGY Frequent Urination?: Yes Hard to postpone urination?: No Burning/pain with urination?: No Get up at night to urinate?: No Leakage of urine?: No Urine stream starts and stops?: No Trouble starting stream?: No Do you have to strain to urinate?: No Blood in urine?: Yes Urinary tract infection?: No Sexually transmitted disease?: No Injury to kidneys or bladder?: No Painful intercourse?:  No Weak stream?: No Currently pregnant?: No Vaginal bleeding?: No Last menstrual period?: n  Gastrointestinal Nausea?: Yes Vomiting?: Yes Indigestion/heartburn?: No Diarrhea?: No Constipation?: Yes  Constitutional Fever: No Night sweats?: No Weight loss?: No Fatigue?: Yes  Skin Skin rash/lesions?: No Itching?: No  Eyes Blurred vision?: No Double vision?: No  Ears/Nose/Throat Sore throat?: No Sinus problems?: No  Hematologic/Lymphatic Swollen glands?: No Easy bruising?: No  Cardiovascular Leg swelling?: No Chest pain?: No  Respiratory Cough?: No Shortness of breath?: No  Endocrine Excessive thirst?: No  Musculoskeletal Back pain?: Yes Joint pain?: No  Neurological Headaches?: Yes Dizziness?: No  Psychologic Depression?: Yes Anxiety?: Yes  Physical Exam: BP (!) 92/57   Pulse 76   Ht 5\' 2"  (1.575 m)   Wt 101 lb (45.8 kg)   BMI 18.47 kg/m   Constitutional:  Alert and oriented, No acute distress. HEENT: [place] AT, moist mucus membranes.  Trachea midline, no masses. Cardiovascular: No clubbing, cyanosis, or edema. Respiratory: Normal respiratory effort, no increased work of breathing. GI: Abdomen is soft, nontender, nondistended, no abdominal masses GU: No CVA tenderness.  Skin: No rashes, bruises or suspicious lesions. Neurologic: Grossly intact, no focal deficits, moving all 4 extremities. Psychiatric: Normal mood and affect.  Laboratory Data: Lab Results  Component Value Date   WBC 9.5 10/14/2016   HGB 14.4 10/14/2016   HCT 41.7 10/14/2016   MCV 89.2 10/14/2016   PLT 236 10/14/2016    Lab Results  Component Value Date   CREATININE 0.94 10/14/2016    Urinalysis UA reviewed, this shows 2+ blood on the dip but no evidence of microscopic blood on microscopy. Otherwise negative. See Epic for details.  Pertinent Imaging: Disc brought with the patient today which was personally reviewed. This was a CT scan abdomen and pelvis with  contrast performed on 05/27/2017. This showed a left lower pole renal calculus which was nonobstructing. The kidneys were otherwise normal. No other GU findings. Other incidental findings include cholelithiasis, emphysema, and a AAA.  Assessment & Plan:    1. Nephrolithiasis Asymptomatic nonobstructing 4 mm left lower pole stone Would not recommend any intervention at this time Recommend follow-up with KUB in 6 months to assess for any growth We discussed general stone prevention techniques including drinking plenty water with goal of producing 2.5 L urine daily, increased citric acid intake, avoidance of high oxalate containing foods, and decreased salt intake.  Information about dietary recommendations given today.  - Urinalysis, Complete  2. Microscopic hematuria Previous moderate blood not performed with microscopy at PCP UA today with 2+ blood without any evidence of red blood cells based on the above, this point in time, no further workup is recommended Given  her smoking history, we'll repeat UA in 6 months to reassess   3. Urinary frequency Baseline urinary frequency, dietary modification discussed including cutting back on coffee Previously on Mybetriq 50 mode grams of minimal effect Not interested in any further pharmacotherapy at this time  4. Smoker Discussed risk factor for bladder cancer in association between smoking and malignancy Advised to cut back or stop   Return in about 6 months (around 12/19/2017) for KUB, UA.  Hollice Espy, MD  [facility] [address] [place], [place] [address] [phone]

## 2017-06-18 NOTE — Patient Instructions (Signed)
Dietary Guidelines to Help Prevent Kidney Stones Kidney stones are deposits of minerals and salts that form inside your kidneys. Your risk of developing kidney stones may be greater depending on your diet, your lifestyle, the medicines you take, and whether you have certain medical conditions. Most people can reduce their chances of developing kidney stones by following the instructions below. Depending on your overall health and the type of kidney stones you tend to develop, your dietitian may give you more specific instructions. What are tips for following this plan? Reading food labels  Choose foods with "no salt added" or "low-salt" labels. Limit your sodium intake to less than 1500 mg per day.  Choose foods with calcium for each meal and snack. Try to eat about 300 mg of calcium at each meal. Foods that contain 200-500 mg of calcium per serving include: ? 8 oz (237 ml) of milk, fortified nondairy milk, and fortified fruit juice. ? 8 oz (237 ml) of kefir, yogurt, and soy yogurt. ? 4 oz (118 ml) of tofu. ? 1 oz of cheese. ? 1 cup (300 g) of dried figs. ? 1 cup (91 g) of cooked broccoli. ? 1-3 oz can of sardines or mackerel.  Most people need 1000 to 1500 mg of calcium each day. Talk to your dietitian about how much calcium is recommended for you. Shopping  Buy plenty of fresh fruits and vegetables. Most people do not need to avoid fruits and vegetables, even if they contain nutrients that may contribute to kidney stones.  When shopping for convenience foods, choose: ? Whole pieces of fruit. ? Premade salads with dressing on the side. ? Low-fat fruit and yogurt smoothies.  Avoid buying frozen meals or prepared deli foods.  Look for foods with live cultures, such as yogurt and kefir. Cooking  Do not add salt to food when cooking. Place a salt shaker on the table and allow each person to add his or her own salt to taste.  Use vegetable protein, such as beans, textured vegetable  protein (TVP), or tofu instead of meat in pasta, casseroles, and soups. Meal planning  Eat less salt, if told by your dietitian. To do this: ? Avoid eating processed or premade food. ? Avoid eating fast food.  Eat less animal protein, including cheese, meat, poultry, or fish, if told by your dietitian. To do this: ? Limit the number of times you have meat, poultry, fish, or cheese each week. Eat a diet free of meat at least 2 days a week. ? Eat only one serving each day of meat, poultry, fish, or seafood. ? When you prepare animal protein, cut pieces into small portion sizes. For most meat and fish, one serving is about the size of one deck of cards.  Eat at least 5 servings of fresh fruits and vegetables each day. To do this: ? Keep fruits and vegetables on hand for snacks. ? Eat 1 piece of fruit or a handful of berries with breakfast. ? Have a salad and fruit at lunch. ? Have two kinds of vegetables at dinner.  Limit foods that are high in a substance called oxalate. These include: ? Spinach. ? Rhubarb. ? Beets. ? Potato chips and french fries. ? Nuts.  If you regularly take a diuretic medicine, make sure to eat at least 1-2 fruits or vegetables high in potassium each day. These include: ? Avocado. ? Banana. ? Orange, prune, carrot, or tomato juice. ? Baked potato. ? Cabbage. ? Beans and split peas.   Beans and split peas. General instructions   Drink enough fluid to keep your urine clear or pale yellow. This is the most important thing you can do.  Talk to your health care provider and dietitian about taking daily supplements. Depending on your health and the cause of your kidney stones, you may be advised:  Not to take supplements with vitamin C.  To take a calcium supplement.  To take a daily probiotic supplement.  To take other supplements such as magnesium, fish oil, or vitamin B6.  Take all medicines and supplements as told by your health care provider.  Limit alcohol intake to no  more than 1 drink a day for nonpregnant women and 2 drinks a day for men. One drink equals 12 oz of beer, 5 oz of wine, or 1 oz of hard liquor.  Lose weight if told by your health care provider. Work with your dietitian to find strategies and an eating plan that works best for you. What foods are not recommended? Limit your intake of the following foods, or as told by your dietitian. Talk to your dietitian about specific foods you should avoid based on the type of kidney stones and your overall health. Grains  Breads. Bagels. Rolls. Baked goods. Salted crackers. Cereal. Pasta. Vegetables  Spinach. Rhubarb. Beets. Canned vegetables. Pickles. Olives. Meats and other protein foods  Nuts. Nut butters. Large portions of meat, poultry, or fish. Salted or cured meats. Deli meats. Hot dogs. Sausages. Dairy  Cheese. Beverages  Regular soft drinks. Regular vegetable juice. Seasonings and other foods  Seasoning blends with salt. Salad dressings. Canned soups. Soy sauce. Ketchup. Barbecue sauce. Canned pasta sauce. Casseroles. Pizza. Lasagna. Frozen meals. Potato chips. French fries. Summary  You can reduce your risk of kidney stones by making changes to your diet.  The most important thing you can do is drink enough fluid. You should drink enough fluid to keep your urine clear or pale yellow.  Ask your health care provider or dietitian how much protein from animal sources you should eat each day, and also how much salt and calcium you should have each day. This information is not intended to replace advice given to you by your health care provider. Make sure you discuss any questions you have with your health care provider. Document Released: 03/02/2011 Document Revised: 10/16/2016 Document Reviewed: 10/16/2016 Elsevier Interactive Patient Education  2017 [employer]  

## 2017-11-19 DIAGNOSIS — Z951 Presence of aortocoronary bypass graft: Secondary | ICD-10-CM

## 2017-11-19 DIAGNOSIS — I219 Acute myocardial infarction, unspecified: Secondary | ICD-10-CM

## 2017-11-19 HISTORY — DX: Acute myocardial infarction, unspecified: I21.9

## 2017-11-19 HISTORY — DX: Presence of aortocoronary bypass graft: Z95.1

## 2017-11-19 HISTORY — PX: CORONARY ARTERY BYPASS GRAFT: SHX141

## 2017-12-10 ENCOUNTER — Ambulatory Visit: Payer: Medicare Other | Admitting: Urology

## 2017-12-11 ENCOUNTER — Ambulatory Visit: Payer: Medicare Other | Admitting: Urology

## 2017-12-20 ENCOUNTER — Other Ambulatory Visit: Payer: Self-pay | Admitting: Internal Medicine

## 2017-12-20 DIAGNOSIS — N200 Calculus of kidney: Secondary | ICD-10-CM

## 2017-12-24 ENCOUNTER — Ambulatory Visit (INDEPENDENT_AMBULATORY_CARE_PROVIDER_SITE_OTHER): Payer: Medicare Other | Admitting: Vascular Surgery

## 2017-12-24 ENCOUNTER — Encounter (INDEPENDENT_AMBULATORY_CARE_PROVIDER_SITE_OTHER): Payer: Self-pay | Admitting: Vascular Surgery

## 2017-12-24 VITALS — BP 131/80 | HR 77 | Resp 16 | Ht 62.0 in | Wt 102.0 lb

## 2017-12-24 DIAGNOSIS — I6523 Occlusion and stenosis of bilateral carotid arteries: Secondary | ICD-10-CM

## 2017-12-24 DIAGNOSIS — E7800 Pure hypercholesterolemia, unspecified: Secondary | ICD-10-CM | POA: Diagnosis not present

## 2017-12-24 DIAGNOSIS — Z720 Tobacco use: Secondary | ICD-10-CM

## 2017-12-24 NOTE — Progress Notes (Signed)
Subjective:    Patient ID: Melissa Montes, female    DOB: 04/06/1956, 63 y.o.   MRN: [id number] Chief Complaint  Patient presents with  . New Patient (Initial Visit)    Carotid artery stenosis   Presents as a new patient referred by Dr. Humphrey Rolls for carotid artery stenosis.  The patient endorses experiencing frequent headaches and dizziness which led to an ultrasound of her carotid arteries.  Ultrasound conducted on December 06, 2017 was notable for possible obstruction in the left bulb with increased velocity.  No significant disease in the right common carotid artery, ICA and ECA with antegrade flow in the bilateral vertebrals.  This led to a CTA of the neck which was notable for a focal stenosis at the origin of the left internal carotid artery likely greater than 75%. The patient denies experiencing Amaurosis Fugax, TIA like symptoms or focal motor deficits.  Patient denies fever, nausea vomiting.   Review of Systems  Constitutional: Negative.   HENT: Negative.   Eyes: Negative.   Respiratory: Negative.   Cardiovascular:       Carotid artery stenosis  Gastrointestinal: Negative.   Endocrine: Negative.   Genitourinary: Negative.   Musculoskeletal: Negative.   Skin: Negative.   Allergic/Immunologic: Negative.   Neurological: Negative.   Hematological: Negative.   Psychiatric/Behavioral: Negative.       Objective:   Physical Exam  Constitutional: She is oriented to person, place, and time. She appears well-developed and well-nourished. No distress.  HENT:  Head: Normocephalic and atraumatic.  Eyes: Conjunctivae are normal. Pupils are equal, round, and reactive to light.  Neck: Normal range of motion.  Left sided carotid bruit noted  Cardiovascular: Normal rate, regular rhythm, normal heart sounds and intact distal pulses.  Pulses:      Radial pulses are 2+ on the right side, and 2+ on the left side.  Pulmonary/Chest: Effort normal and breath sounds normal.    Musculoskeletal: Normal range of motion. She exhibits no edema.  Neurological: She is alert and oriented to person, place, and time.  Skin: Skin is warm and dry. She is not diaphoretic.  Psychiatric: She has a normal mood and affect. Her behavior is normal. Judgment and thought content normal.  Vitals reviewed.  BP 131/80 (BP Location: Right Arm, Patient Position: Sitting)   Pulse 77   Resp 16   Ht 5\' 2"  (1.575 m)   Wt 102 lb (46.3 kg)   BMI 18.66 kg/m   Past Medical History:  Diagnosis Date  . Anxiety   . Bipolar disorder ([place])   . Depression   . Heart disease   . Hyperlipidemia   . MI (myocardial infarction) [facility])    Social History   Socioeconomic History  . Marital status: Divorced    Spouse name: Not on file  . Number of children: Not on file  . Years of education: Not on file  . Highest education level: Not on file  Social Needs  . Financial resource strain: Not on file  . Food insecurity - worry: Not on file  . Food insecurity - inability: Not on file  . Transportation needs - medical: Not on file  . Transportation needs - non-medical: Not on file  Occupational History  . Not on file  Tobacco Use  . Smoking status: Current Every Day Smoker    Packs/day: 0.50    Types: Cigarettes  . Smokeless tobacco: Never Used  Substance and Sexual Activity  . Alcohol use: No  . Drug  use: No  . Sexual activity: Not on file  Other Topics Concern  . Not on file  Social History Narrative  . Not on file   Past Surgical History:  Procedure Laterality Date  . ABDOMINAL HYSTERECTOMY    . CORONARY ANGIOPLASTY WITH STENT PLACEMENT     Family History  Problem Relation Age of Onset  . Breast cancer Mother 13  . Breast cancer Maternal Aunt        mat aunt and great aunt   Allergies  Allergen Reactions  . Keflex [Cephalexin] Hives      Assessment & Plan:  Presents as a new patient referred by Dr. Humphrey Rolls for carotid artery stenosis.  The patient endorses experiencing  frequent headaches and dizziness which led to an ultrasound of her carotid arteries.  Ultrasound conducted on December 06, 2017 was notable for possible obstruction in the left bulb with increased velocity.  No significant disease in the right common carotid artery, ICA and ECA with antegrade flow in the bilateral vertebrals.  This led to a CTA of the neck which was notable for a focal stenosis at the origin of the left internal carotid artery likely greater than 75%. The patient denies experiencing Amaurosis Fugax, TIA like symptoms or focal motor deficits.  Patient denies fever, nausea vomiting.  1. Bilateral carotid artery stenosis - New Presents with results of her recent CTA of the neck however I do not have the actual images I will need the images in order to review with Dr. Lucky Cowboy to assess if the patient is a candidate for an open versus stenting procedure The patient understands this and is going to request a copy of her images The patient will bring them to the office ASAP  2. Tobacco abuse - Stable We had a discussion for approximately 10 minutes regarding the absolute need for smoking cessation due to the deleterious nature of tobacco on the vascular system. We discussed the tobacco use would diminish patency of any intervention, and likely significantly worsen progressio of disease. We discussed multiple agents for quitting including replacement therapy or medications to reduce cravings such as Chantix. The patient voices their understanding of the importance of smoking cessation.  3. Pure hypercholesterolemia - Stable Encouraged good control as its slows the progression of atherosclerotic disease  Current Outpatient Medications on File Prior to Visit  Medication Sig Dispense Refill  . aspirin EC 81 MG tablet Take 81 mg by mouth daily.    Marland Kitchen atorvastatin (LIPITOR) 80 MG tablet Take 80 mg by mouth daily.     . carbamazepine (TEGRETOL) 200 MG tablet Take 200 mg by mouth daily.     . diazepam  (VALIUM) 10 MG tablet TK 1 T PO QD PRN  5  . ezetimibe (ZETIA) 10 MG tablet     . FLUoxetine (PROZAC) 40 MG capsule Take 40 mg by mouth daily.     . metoprolol succinate (TOPROL-XL) 25 MG 24 hr tablet     . Multiple Vitamin (MULTIVITAMIN) tablet Take 1 tablet by mouth daily.    . nitroGLYCERIN (NITROSTAT) 0.4 MG SL tablet     . ondansetron (ZOFRAN) 4 MG tablet     . pantoprazole (PROTONIX) 40 MG tablet     . traZODone (DESYREL) 100 MG tablet     . busPIRone (BUSPAR) 7.5 MG tablet TAKE 1 TABLET BY MOUTH TWICE DAILY    . MYRBETRIQ 50 MG TB24 tablet Take 50 mg by mouth daily.      No  current facility-administered medications on file prior to visit.    There are no Patient Instructions on file for this visit. No Follow-up on file.  Caliah Kopke A Eidan Muellner, PA-C

## 2017-12-25 ENCOUNTER — Encounter (INDEPENDENT_AMBULATORY_CARE_PROVIDER_SITE_OTHER): Payer: Self-pay

## 2017-12-27 ENCOUNTER — Ambulatory Visit: Payer: Medicare Other

## 2017-12-30 ENCOUNTER — Encounter (INDEPENDENT_AMBULATORY_CARE_PROVIDER_SITE_OTHER): Payer: Self-pay | Admitting: Vascular Surgery

## 2017-12-31 ENCOUNTER — Telehealth (INDEPENDENT_AMBULATORY_CARE_PROVIDER_SITE_OTHER): Payer: Self-pay | Admitting: Vascular Surgery

## 2017-12-31 NOTE — Telephone Encounter (Signed)
New Message  Pt verbalized wanting to know if MD has read CD of CT results and awaiting a call back.

## 2017-12-31 NOTE — Telephone Encounter (Signed)
I spoke with JD and he recommend a angiogram and if the patient have more questions she can talk to [facility]

## 2018-01-06 ENCOUNTER — Telehealth (INDEPENDENT_AMBULATORY_CARE_PROVIDER_SITE_OTHER): Payer: Self-pay | Admitting: Vascular Surgery

## 2018-01-06 NOTE — Telephone Encounter (Signed)
Spoke with patient about the CTA of the neck image CD she dropped off at our office.  The images were read by Dr. Lucky Cowboy personally who feels that there is around 50% stenosis to the bilateral internal carotid arteries.  This is less than what the radiologist had initially read on the patient's December 12, 2017 CTA.  Dr. Lucky Cowboy recommends a carotid angiogram for further evaluation.  Procedure, risks and benefits were explained to the patient.  All of her questions were answered.  The patient wishes to proceed with the carotid angiogram for further evaluation.

## 2018-01-07 ENCOUNTER — Other Ambulatory Visit (INDEPENDENT_AMBULATORY_CARE_PROVIDER_SITE_OTHER): Payer: Self-pay | Admitting: Vascular Surgery

## 2018-01-07 ENCOUNTER — Encounter (INDEPENDENT_AMBULATORY_CARE_PROVIDER_SITE_OTHER): Payer: Self-pay

## 2018-01-12 MED ORDER — CLINDAMYCIN PHOSPHATE 300 MG/50ML IV SOLN
300.0000 mg | Freq: Once | INTRAVENOUS | Status: AC
  Administered 2018-01-13: 300 mg via INTRAVENOUS

## 2018-01-13 ENCOUNTER — Encounter
Admission: RE | Disposition: A | Discharge status: Home / Self Care | Payer: Self-pay | Source: Ambulatory Visit | Attending: Vascular Surgery

## 2018-01-13 ENCOUNTER — Ambulatory Visit
Admission: RE | Admit: 2018-01-13 | Discharge: 2018-01-13 | Disposition: A | Discharge status: Home / Self Care | Payer: Medicare Other | Source: Ambulatory Visit | Attending: Vascular Surgery | Admitting: Vascular Surgery

## 2018-01-13 ENCOUNTER — Encounter: Payer: Self-pay | Admitting: *Deleted

## 2018-01-13 DIAGNOSIS — Z955 Presence of coronary angioplasty implant and graft: Secondary | ICD-10-CM | POA: Diagnosis not present

## 2018-01-13 DIAGNOSIS — E7800 Pure hypercholesterolemia, unspecified: Secondary | ICD-10-CM | POA: Diagnosis not present

## 2018-01-13 DIAGNOSIS — Z79899 Other long term (current) drug therapy: Secondary | ICD-10-CM | POA: Diagnosis not present

## 2018-01-13 DIAGNOSIS — F419 Anxiety disorder, unspecified: Secondary | ICD-10-CM | POA: Insufficient documentation

## 2018-01-13 DIAGNOSIS — F319 Bipolar disorder, unspecified: Secondary | ICD-10-CM | POA: Insufficient documentation

## 2018-01-13 DIAGNOSIS — I6529 Occlusion and stenosis of unspecified carotid artery: Secondary | ICD-10-CM | POA: Diagnosis present

## 2018-01-13 DIAGNOSIS — Z853 Personal history of malignant neoplasm of breast: Secondary | ICD-10-CM | POA: Diagnosis not present

## 2018-01-13 DIAGNOSIS — I6522 Occlusion and stenosis of left carotid artery: Secondary | ICD-10-CM | POA: Diagnosis not present

## 2018-01-13 DIAGNOSIS — Z90710 Acquired absence of both cervix and uterus: Secondary | ICD-10-CM | POA: Diagnosis not present

## 2018-01-13 DIAGNOSIS — Z7982 Long term (current) use of aspirin: Secondary | ICD-10-CM | POA: Insufficient documentation

## 2018-01-13 DIAGNOSIS — F17210 Nicotine dependence, cigarettes, uncomplicated: Secondary | ICD-10-CM | POA: Diagnosis not present

## 2018-01-13 DIAGNOSIS — I519 Heart disease, unspecified: Secondary | ICD-10-CM | POA: Diagnosis not present

## 2018-01-13 DIAGNOSIS — I6523 Occlusion and stenosis of bilateral carotid arteries: Secondary | ICD-10-CM | POA: Insufficient documentation

## 2018-01-13 DIAGNOSIS — E785 Hyperlipidemia, unspecified: Secondary | ICD-10-CM | POA: Diagnosis not present

## 2018-01-13 DIAGNOSIS — Z881 Allergy status to other antibiotic agents status: Secondary | ICD-10-CM | POA: Insufficient documentation

## 2018-01-13 HISTORY — PX: CAROTID ANGIOGRAPHY: CATH118230

## 2018-01-13 LAB — CREATININE, SERUM
Creatinine, Ser: 0.51 mg/dL (ref 0.44–1.00)
GFR calc Af Amer: >60 mL/min (ref >60)
GFR calc non Af Amer: >60 mL/min (ref >60)

## 2018-01-13 LAB — BUN: BUN: 19 mg/dL (ref 6–20)

## 2018-01-13 SURGERY — CAROTID ANGIOGRAPHY
Anesthesia: Moderate Sedation

## 2018-01-13 MED ORDER — SODIUM CHLORIDE 0.9 % IV SOLN: INTRAVENOUS | Status: DC

## 2018-01-13 MED ORDER — SODIUM CHLORIDE 0.9 % IV SOLN
INTRAVENOUS | Status: DC
  Administered 2018-01-13: 13:00:00 via INTRAVENOUS

## 2018-01-13 MED ORDER — LIDOCAINE-EPINEPHRINE (PF) 1 %-1:200000 IJ SOLN
INTRAMUSCULAR | Status: AC
  Filled 2018-01-13: qty 30

## 2018-01-13 MED ORDER — SODIUM CHLORIDE 0.9% FLUSH: 3.0000 mL | INTRAVENOUS | Status: DC | PRN

## 2018-01-13 MED ORDER — FENTANYL CITRATE (PF) 100 MCG/2ML IJ SOLN
INTRAMUSCULAR | Status: DC | PRN
  Administered 2018-01-13: 25 ug via INTRAVENOUS
  Administered 2018-01-13: 50 ug via INTRAVENOUS
  Administered 2018-01-13: 25 ug via INTRAVENOUS

## 2018-01-13 MED ORDER — SODIUM CHLORIDE 0.9% FLUSH: 3.0000 mL | Freq: Two times a day (BID) | INTRAVENOUS | Status: DC

## 2018-01-13 MED ORDER — HYDRALAZINE HCL 20 MG/ML IJ SOLN: 5.0000 mg | INTRAMUSCULAR | Status: DC | PRN

## 2018-01-13 MED ORDER — FAMOTIDINE 20 MG PO TABS: 40.0000 mg | ORAL_TABLET | ORAL | Status: DC | PRN

## 2018-01-13 MED ORDER — HEPARIN (PORCINE) IN NACL 2-0.9 UNIT/ML-% IJ SOLN
INTRAMUSCULAR | Status: AC
  Filled 2018-01-13: qty 1000

## 2018-01-13 MED ORDER — LABETALOL HCL 5 MG/ML IV SOLN: 10.0000 mg | INTRAVENOUS | Status: DC | PRN

## 2018-01-13 MED ORDER — MIDAZOLAM HCL 5 MG/5ML IJ SOLN
INTRAMUSCULAR | Status: AC
  Filled 2018-01-13: qty 5

## 2018-01-13 MED ORDER — MIDAZOLAM HCL 2 MG/2ML IJ SOLN
INTRAMUSCULAR | Status: DC | PRN
  Administered 2018-01-13: 1 mg via INTRAVENOUS
  Administered 2018-01-13: 2 mg via INTRAVENOUS
  Administered 2018-01-13 (×2): 1 mg via INTRAVENOUS

## 2018-01-13 MED ORDER — CLINDAMYCIN PHOSPHATE 300 MG/50ML IV SOLN
INTRAVENOUS | Status: DC
Start: 2018-01-13 — End: 2018-01-13
  Filled 2018-01-13: qty 50

## 2018-01-13 MED ORDER — HEPARIN SODIUM (PORCINE) 1000 UNIT/ML IJ SOLN
INTRAMUSCULAR | Status: AC
  Filled 2018-01-13: qty 1

## 2018-01-13 MED ORDER — METHYLPREDNISOLONE SODIUM SUCC 125 MG IJ SOLR: 125.0000 mg | INTRAMUSCULAR | Status: DC | PRN

## 2018-01-13 MED ORDER — IOPAMIDOL (ISOVUE-300) INJECTION 61%
INTRAVENOUS | Status: DC | PRN
  Administered 2018-01-13: 65 mL via INTRAVENOUS

## 2018-01-13 MED ORDER — SODIUM CHLORIDE 0.9 % IV SOLN: 250.0000 mL | INTRAVENOUS | Status: DC | PRN

## 2018-01-13 MED ORDER — FENTANYL CITRATE (PF) 100 MCG/2ML IJ SOLN
INTRAMUSCULAR | Status: AC
  Filled 2018-01-13: qty 2

## 2018-01-13 MED ORDER — HEPARIN SODIUM (PORCINE) 1000 UNIT/ML IJ SOLN
INTRAMUSCULAR | Status: DC | PRN
  Administered 2018-01-13: 3000 [IU] via INTRAVENOUS

## 2018-01-13 SURGICAL SUPPLY — 10 items
CATH 5FR JB2 100CM (CATHETERS) ×2 IMPLANT
CATH ANGIO 5F 100CM .035 PIG (CATHETERS) ×2 IMPLANT
CATH G 5FX100 (CATHETERS) ×2 IMPLANT
DEVICE STARCLOSE SE CLOSURE (Vascular Products) ×2 IMPLANT
DEVICE TORQUE (MISCELLANEOUS) ×2 IMPLANT
GUIDEWIRE LT ZIPWIRE 035X260 (WIRE) ×4 IMPLANT
KIT CAROTID MANIFOLD (MISCELLANEOUS) ×4 IMPLANT
PACK ANGIOGRAPHY (CUSTOM PROCEDURE TRAY) ×2 IMPLANT
SHEATH BRITE TIP 5FRX11 (SHEATH) ×2 IMPLANT
TUBING CONTRAST HIGH PRESS 72 (TUBING) ×2 IMPLANT

## 2018-01-13 NOTE — Progress Notes (Signed)
Patient post carotid angiogram via right femoral access per Dr Lucky Cowboy. Awake alert, and oriented. Vitals stable. Denies complaints. Dr Lucky Cowboy out to speak with patient and friend with questions answered. Sinus rhythm per monitor.,

## 2018-01-13 NOTE — Discharge Instructions (Signed)
Angiogram  An angiogram is an X-ray test. It is used to look at your blood vessels. For this test, a dye is put into the blood vessel being checked. The dye shows up on X-rays. It helps your doctor see if there is a blockage or other problem in the blood vessel.  What happens before the procedure?   Follow your doctor's instructions about limiting what you eat or drink.   Ask your doctor if you may drink enough water to take any needed medicines the morning of the test.   Plan to have someone take you home after the test.   If you go home the same day as the test, plan to have someone stay with you for 24 hours.  What happens during the procedure?   An IV tube will be put into one of your veins.   You will be given a medicine that makes you relax (sedative).   Your skin will be washed where the thin tube (catheter) will be put in. Hair may be removed from this area. The tube may be put into:  ? Your upper leg area (groin).  ? The fold of your arm, near your elbow.  ? Your wrist.   You will be given a medicine that numbs the area where the tube will be inserted (local anesthetic).   The tube will be inserted into a blood vessel.   Using a type of X-ray (fluoroscopy) to see, your doctor will move the tube into the blood vessel to check it.   Dye will be put in through the tube. X-rays of your blood vessels will then be taken.  Different doctors and hospitals may do this procedure differently.  What happens after the procedure?   If the test is done through the leg, you will be kept in bed lying flat for several hours. You will be told to not bend or cross your legs.   The area where the tube was inserted will be checked often.   The pulse in your feet or wrist will be checked often.   More tests or X-rays may be done.  This information is not intended to replace advice given to you by your health care provider. Make sure you discuss any questions you have with your health care provider.  Document  Released: 02/01/2009 Document Revised: 04/12/2016 Document Reviewed: 04/08/2013  Elsevier Interactive Patient Education  2017 [employer]vier Inc[employer]

## 2018-01-13 NOTE — Op Note (Signed)
[facility]   OPERATIVE NOTE  DATE: 01/13/2018  PRE-OPERATIVE DIAGNOSIS: 1. Carotid artery stenosis 2.  Disparate findings between ultrasound and CT angiogram with a markedly different interpretation between the radiologist and myself on CT scan  POST-OPERATIVE DIAGNOSIS: Same as above  PROCEDURE: 1.   Ultrasound Guidance for vascular access right femoral artery 2.   Catheter placement into right common carotid artery and into left common carotid artery from right femoral approach 3.   Thoracic aortogram 4.   Cervical and cerebral bilateral carotid angiograms 5.   StarClose closure device right femoral artery  SURGEON: Leotis Pain, MD  ASSISTANT(S): None  ANESTHESIA: Moderate conscious sedation  ESTIMATED BLOOD LOSS: 5 cc  FLUORO TIME: 6.2 minutes  CONTRAST: 65 cc  MODERATE CONSCIOUS SEDATION TIME: Approximately 30 minutes using 5 of Versed and 100 Mcg of Fentanyl  FINDING(S): 1.  Minimal right internal carotid artery stenosis of less than 20%.  Normal intracranial filling on the right and the left carotid arteries.  Mild to moderate left internal carotid artery stenosis with a focal stenosis in the 45-50% range at the bifurcation.  SPECIMEN(S):  None  INDICATIONS:   Patient is a 62 y.o.female who presents with carotid stenosis and vague neurologic symptoms. The patient's ultrasound and CT scan had disparate findings with the radiologist interpretation of the CT scan being a high-grade stenosis and my interpretation being more of a moderate stenosis.  To help determine the best treatment option for the patient, catheter-based angiogram is performed for further evaluation. Risks and benefits are discussed and informed consent was obtained.  DESCRIPTION: After obtaining full informed written consent, the patient was brought back to the operating room and placed supine upon the vascular suite table.  After obtaining adequate anesthesia, the patient was  prepped and draped in the standard fashion.  Moderate conscious sedation was administered during a face to face encounter with the patient throughout the procedure with my supervision of the RN administering medicines and monitoring the patients vital signs and mental status throughout from the start of the procedure until the patient was taken to the recovery room. The right femoral artery was visualized with ultrasound and found to be patent. It was then accessed under direct ultrasound guidance without difficulty with a Seldinger needle. A J-wire and 5 French sheath were placed and a permanent image was recorded. The patient was given 3000 units of intravenous heparin. A pigtail catheter was placed into the ascending aorta and an LAO projection thoracic aortogram was performed. This showed a bovine arch with no proximal stenosis in the right innominate, left common carotid artery, or left subclavian artery.  There appeared to be brisk vertebral filling bilaterally.  I then selected a JB2 catheter and cannulated the innominate artery advancing into the mid right common carotid artery. Cervical and cerebral carotid angiography were then performed on the right side initially. The intracranial flow was found to be brisk and normal but without significant cross-filling right to left. The cervical carotid artery was minimally diseased and have a less than 20% stenosis. I then turned my attention back to the thoracic aorta and removed the catheter from the right side. I selectively cannulated the left common carotid artery without difficulty with a Bentson Hanafee catheter and advanced into the mid left common carotid artery. Selective imaging was then performed of the cervical and cerebral carotid artery on the left. Intracranial filling was brisk and normal with no intracranial filling defects but no left to right  cross filling. The cervical carotid artery was found to have a short segment focal stenosis in the 45-50%  range by NASCET criteria.  There was a complete 360 loop in the distal internal carotid artery prior to entering the skull. Multiple views were taken in the cervical carotid artery bilaterally. At this point, we had imaging to plan our treatment and we elected to terminate the procedure. The diagnostic catheter was removed. Oblique arteriogram was performed of the right femoral artery and StarClose closure device was deployed in usual fashion with excellent hemostatic result. The patient tolerated the procedure well and was taken to the recovery room in stable condition.  COMPLICATIONS: None  CONDITION: Stable   Leotis Pain 01/13/2018 3:10 PM   This note was created with Dragon Medical transcription system. Any errors in dictation are purely unintentional.

## 2018-01-13 NOTE — H&P (Signed)
[facility] History & Physical Update  The patient was interviewed and re-examined.  The patient's previous History and Physical has been reviewed and is unchanged.  The CT angio read is of high grade stenosis but my review looks like this is moderate.  Recommend Catheter based angiogram for further evaluation. We plan to proceed with the scheduled procedure.  Leotis Pain, MD  01/13/2018, 11:37 AM

## 2018-01-14 ENCOUNTER — Encounter: Payer: Self-pay | Admitting: Vascular Surgery

## 2018-01-21 ENCOUNTER — Ambulatory Visit: Payer: Medicare Other

## 2018-01-27 ENCOUNTER — Ambulatory Visit: Payer: Medicare Other

## 2018-01-31 ENCOUNTER — Ambulatory Visit (INDEPENDENT_AMBULATORY_CARE_PROVIDER_SITE_OTHER): Payer: Medicare Other | Admitting: Vascular Surgery

## 2018-01-31 ENCOUNTER — Encounter (INDEPENDENT_AMBULATORY_CARE_PROVIDER_SITE_OTHER): Payer: Self-pay | Admitting: Vascular Surgery

## 2018-01-31 VITALS — BP 109/76 | HR 91 | Resp 16 | Wt 104.0 lb

## 2018-01-31 DIAGNOSIS — I714 Abdominal aortic aneurysm, without rupture, unspecified: Secondary | ICD-10-CM | POA: Insufficient documentation

## 2018-01-31 DIAGNOSIS — I6523 Occlusion and stenosis of bilateral carotid arteries: Secondary | ICD-10-CM

## 2018-01-31 DIAGNOSIS — E785 Hyperlipidemia, unspecified: Secondary | ICD-10-CM

## 2018-01-31 NOTE — Assessment & Plan Note (Signed)
She has undergone a CT scan of the abdomen and pelvis which I have reviewed recently.  The official report was of a 3.2 cm infrarenal abdominal aortic aneurysm in the AP diameter.  By my interpretation, this appears to be a few mm larger, particularly in the transverse diameter, but still reasonably small.  There was moderate associated atherosclerotic disease particularly in the iliac vessels and some mural thrombus. We had a long discussion today about aneurysms and their natural history.  This represents a reasonably small aneurysm with minimal risk of rupture.  At this time, it is below the threshold for prophylactic repair.  We will recheck this when she comes back for her carotid duplex later in the year.  We will follow this with duplex unless it approaches a size for repair.

## 2018-01-31 NOTE — Patient Instructions (Signed)
Abdominal Aortic Aneurysm Blood pumps away from the heart through tubes (blood vessels) called arteries. Aneurysms are weak or damaged places in the wall of an artery. It bulges out like a balloon. An abdominal aortic aneurysm happens in the main artery of the body (aorta). It can burst or tear, causing bleeding inside the body. This is an emergency. It needs treatment right away. What are the causes? The exact cause is unknown. Things that could cause this problem include:  Fat and other substances building up in the lining of a tube.  Swelling of the walls of a blood vessel.  Certain tissue diseases.  Belly (abdominal) trauma.  An infection in the main artery of the body.  What increases the risk? There are things that make it more likely for you to have an aneurysm. These include:  Being over the age of 62 years old.  Having high blood pressure (hypertension).  Being a female.  Being white.  Being very overweight (obese).  Having a family history of aneurysm.  Using tobacco products.  What are the signs or symptoms? Symptoms depend on the size of the aneurysm and how fast it grows. There may not be symptoms. If symptoms occur, they can include:  Pain (belly, side, lower back, or groin).  Feeling full after eating a small amount of food.  Feeling sick to your stomach (nauseous), throwing up (vomiting), or both.  Feeling a lump in your belly that feels like it is beating (pulsating).  Feeling like you will pass out (faint).  How is this treated?  Medicine to control blood pressure and pain.  Imaging tests to see if the aneurysm gets bigger.  Surgery. How is this prevented? To lessen your chance of getting this condition:  Stop smoking. Stop chewing tobacco.  Limit or avoid alcohol.  Keep your blood pressure, blood sugar, and cholesterol within normal limits.  Eat less salt.  Eat foods low in saturated fats and cholesterol. These are found in animal and  whole dairy products.  Eat more fiber. Fiber is found in whole grains, vegetables, and fruits.  Keep a healthy weight.  Stay active and exercise often.  This information is not intended to replace advice given to you by your health care provider. Make sure you discuss any questions you have with your health care provider. Document Released: 03/02/2013 Document Revised: 04/12/2016 Document Reviewed: 12/05/2012 Elsevier Interactive Patient Education  2017 Elsevier Inc.  

## 2018-01-31 NOTE — Assessment & Plan Note (Signed)
Checked with angiogram recently and found to be subcritical.  To be checked in about 5-6 months.

## 2018-01-31 NOTE — Progress Notes (Signed)
MRN : [id number]  Melissa Montes is a 62 y.o. (Jun 09, 1956) female who presents with chief complaint of  Chief Complaint  Patient presents with  . Follow-up    ref Humphrey Rolls for AAA  .  History of Present Illness: Patient returns today on referral from her PCP for a new issue.  We evaluated her carotid disease earlier this year and it was subcritical and medical management was recommended.  She has undergone a CT scan of the abdomen and pelvis which I have reviewed recently.  The official report was of a 3.2 cm infrarenal abdominal aortic aneurysm in the AP diameter.  By my interpretation, this appears to be a few mm larger, particularly in the transverse diameter, but still reasonably small.  There was moderate associated atherosclerotic disease particularly in the iliac vessels and some mural thrombus.  Current Outpatient Medications  Medication Sig Dispense Refill  . acetaminophen (TYLENOL) 325 MG tablet Take 325-650 mg by mouth every 6 (six) hours as needed (for pain.).    Marland Kitchen aspirin EC 81 MG tablet Take 162 mg by mouth every evening.     Marland Kitchen atorvastatin (LIPITOR) 80 MG tablet Take 80 mg by mouth every evening.     . carbamazepine (TEGRETOL) 200 MG tablet Take 200 mg by mouth daily.     . diazepam (VALIUM) 10 MG tablet Take 10 mg by mouth daily as needed for anxiety.    Marland Kitchen ezetimibe (ZETIA) 10 MG tablet Take 10 mg by mouth daily.     Marland Kitchen FLUoxetine (PROZAC) 40 MG capsule Take 40 mg by mouth daily.     . metoprolol succinate (TOPROL-XL) 25 MG 24 hr tablet Take 25 mg by mouth daily.     . Multiple Vitamin (MULTIVITAMIN) tablet Take 1 tablet by mouth daily. Centrum Silver for Women    . nitroGLYCERIN (NITROSTAT) 0.4 MG SL tablet Place 0.4 mg under the tongue every 5 (five) minutes x 3 doses as needed for chest pain.     Marland Kitchen ondansetron (ZOFRAN) 4 MG tablet Take 4 mg by mouth every 8 (eight) hours as needed (for nausea/vomiting).     . pantoprazole (PROTONIX) 40 MG tablet Take 40 mg by mouth  daily.     . Probiotic Product (RESTORA PO) Take by mouth daily.    . traZODone (DESYREL) 100 MG tablet Take 100 mg by mouth at bedtime.      No current facility-administered medications for this visit.     Past Medical History:  Diagnosis Date  . Anxiety   . Bipolar disorder ([place])   . Depression   . Heart disease   . Hyperlipidemia   . MI (myocardial infarction) [facility])     Past Surgical History:  Procedure Laterality Date  . ABDOMINAL HYSTERECTOMY    . CAROTID ANGIOGRAPHY N/A 01/13/2018   Procedure: CAROTID ANGIOGRAPHY;  Surgeon: Algernon Huxley, MD;  Location: [facility];  Service: Cardiovascular;  Laterality: N/A;  . CORONARY ANGIOPLASTY WITH STENT PLACEMENT      Social History Social History   Tobacco Use  . Smoking status: Current Every Day Smoker    Packs/day: 0.50    Types: Cigarettes  . Smokeless tobacco: Never Used  Substance Use Topics  . Alcohol use: No  . Drug use: No     Family History Family History  Problem Relation Age of Onset  . Breast cancer Mother 91  . Breast cancer Maternal Aunt        mat aunt  and great aunt  no bleeding disorders or clotting disorders  Allergies  Allergen Reactions  . Keflex [Cephalexin] Hives     REVIEW OF SYSTEMS (Negative unless checked)  Constitutional: '[]' Weight loss  '[]' Fever  '[]' Chills Cardiac: '[]' Chest pain   '[]' Chest pressure   '[x]' Palpitations   '[]' Shortness of breath when laying flat   '[]' Shortness of breath at rest   '[x]' Shortness of breath with exertion. Vascular:  '[]' Pain in legs with walking   '[]' Pain in legs at rest   '[]' Pain in legs when laying flat   '[]' Claudication   '[]' Pain in feet when walking  '[]' Pain in feet at rest  '[]' Pain in feet when laying flat   '[]' History of DVT   '[]' Phlebitis   '[]' Swelling in legs   '[]' Varicose veins   '[]' Non-healing ulcers Pulmonary:   '[]' Uses home oxygen   '[]' Productive cough   '[]' Hemoptysis   '[]' Wheeze  '[]' COPD   '[]' Asthma Neurologic:  '[x]' Dizziness  '[]' Blackouts   '[]' Seizures   '[]' History of  stroke   '[]' History of TIA  '[]' Aphasia   '[]' Temporary blindness   '[]' Dysphagia   '[]' Weakness or numbness in arms   '[]' Weakness or numbness in legs Musculoskeletal:  '[]' Arthritis   '[]' Joint swelling   '[]' Joint pain   '[]' Low back pain Hematologic:  '[]' Easy bruising  '[]' Easy bleeding   '[]' Hypercoagulable state   '[]' Anemic   Gastrointestinal:  '[]' Blood in stool   '[]' Vomiting blood  '[x]' Gastroesophageal reflux/heartburn   '[]' Abdominal pain Genitourinary:  '[]' Chronic kidney disease   '[]' Difficult urination  '[]' Frequent urination  '[]' Burning with urination   '[]' Hematuria Skin:  '[]' Rashes   '[]' Ulcers   '[]' Wounds Psychological:  '[x]' History of anxiety   '[x]'  History of major depression.  Physical Examination  BP 109/76 (BP Location: Right Arm)   Pulse 91   Resp 16   Wt 47.2 kg (104 lb)   BMI 19.02 kg/m  Gen:  WD/WN, NAD Head: [place]/AT, No temporalis wasting. Ear/Nose/Throat: Hearing grossly intact, nares w/o erythema or drainage, trachea midline Eyes: Conjunctiva clear. Sclera non-icteric Neck: Supple.  No JVD.  Pulmonary:  Good air movement, no use of accessory muscles.  Cardiac: Somewhat irregular Vascular:  Vessel Right Left  Radial Palpable Palpable                                    Musculoskeletal: M/S 5/5 throughout.  No deformity or atrophy.  No edema. Neurologic: Sensation grossly intact in extremities.  Symmetrical.  Speech is fluent.  Psychiatric: Judgment intact, Mood & affect appropriate for pt's clinical situation. Dermatologic: Previous angiogram access site is well-healed      Labs Recent Results (from the past 2160 hour(s))  BUN     Status: None   Collection Time: 01/13/18 12:39 PM  Result Value Ref Range   BUN 19 6 - 20 mg/dL    Comment: Performed at [facility], [address] [place], [place] [address]  Creatinine, serum     Status: None   Collection Time: 01/13/18 12:39 PM  Result Value Ref Range   Creatinine, Ser 0.51 0.44 - 1.00 mg/dL   GFR calc non Af Amer >60 >60  mL/min   GFR calc Af Amer >60 >60 mL/min    Comment: (NOTE) The eGFR has been calculated using the CKD EPI equation. This calculation has not been validated in all clinical situations. eGFR's persistently <60 mL/min signify possible Chronic Kidney Disease. Performed at [facility], [address],  [place], [place] [address]     Radiology No results found.    Assessment/Plan  Bilateral carotid artery stenosis Checked with angiogram recently and found to be subcritical.  To be checked in about 5-6 months.  Hyperlipemia lipid control important in reducing the progression of atherosclerotic disease. Continue statin therapy   AAA (abdominal aortic aneurysm) without rupture ([place]) She has undergone a CT scan of the abdomen and pelvis which I have reviewed recently.  The official report was of a 3.2 cm infrarenal abdominal aortic aneurysm in the AP diameter.  By my interpretation, this appears to be a few mm larger, particularly in the transverse diameter, but still reasonably small.  There was moderate associated atherosclerotic disease particularly in the iliac vessels and some mural thrombus. We had a long discussion today about aneurysms and their natural history.  This represents a reasonably small aneurysm with minimal risk of rupture.  At this time, it is below the threshold for prophylactic repair.  We will recheck this when she comes back for her carotid duplex later in the year.  We will follow this with duplex unless it approaches a size for repair.    Leotis Pain, MD  01/31/2018 2:51 PM    This note was created with Dragon medical transcription system.  Any errors from dictation are purely unintentional

## 2018-01-31 NOTE — Assessment & Plan Note (Signed)
lipid control important in reducing the progression of atherosclerotic disease. Continue statin therapy  

## 2018-02-05 ENCOUNTER — Encounter (INDEPENDENT_AMBULATORY_CARE_PROVIDER_SITE_OTHER): Payer: Self-pay

## 2018-05-19 ENCOUNTER — Other Ambulatory Visit: Payer: Self-pay | Admitting: Internal Medicine

## 2018-05-19 DIAGNOSIS — Z1231 Encounter for screening mammogram for malignant neoplasm of breast: Secondary | ICD-10-CM

## 2018-06-24 ENCOUNTER — Ambulatory Visit (INDEPENDENT_AMBULATORY_CARE_PROVIDER_SITE_OTHER): Payer: Medicare Other | Admitting: Vascular Surgery

## 2018-06-24 ENCOUNTER — Encounter (INDEPENDENT_AMBULATORY_CARE_PROVIDER_SITE_OTHER): Payer: Medicare Other

## 2018-06-24 ENCOUNTER — Encounter (INDEPENDENT_AMBULATORY_CARE_PROVIDER_SITE_OTHER): Payer: Self-pay | Admitting: Vascular Surgery

## 2018-06-24 ENCOUNTER — Other Ambulatory Visit (INDEPENDENT_AMBULATORY_CARE_PROVIDER_SITE_OTHER): Payer: Medicare Other

## 2018-06-24 ENCOUNTER — Other Ambulatory Visit (INDEPENDENT_AMBULATORY_CARE_PROVIDER_SITE_OTHER): Payer: Self-pay | Admitting: Vascular Surgery

## 2018-06-24 VITALS — BP 117/73 | HR 66 | Resp 16 | Ht 62.0 in | Wt 100.8 lb

## 2018-06-24 DIAGNOSIS — E785 Hyperlipidemia, unspecified: Secondary | ICD-10-CM

## 2018-06-24 DIAGNOSIS — I679 Cerebrovascular disease, unspecified: Secondary | ICD-10-CM

## 2018-06-24 DIAGNOSIS — I6523 Occlusion and stenosis of bilateral carotid arteries: Secondary | ICD-10-CM | POA: Diagnosis not present

## 2018-06-24 DIAGNOSIS — I714 Abdominal aortic aneurysm, without rupture, unspecified: Secondary | ICD-10-CM

## 2018-06-24 NOTE — Patient Instructions (Signed)
Abdominal Aortic Aneurysm Blood pumps away from the heart through tubes (blood vessels) called arteries. Aneurysms are weak or damaged places in the wall of an artery. It bulges out like a balloon. An abdominal aortic aneurysm happens in the main artery of the body (aorta). It can burst or tear, causing bleeding inside the body. This is an emergency. It needs treatment right away. What are the causes? The exact cause is unknown. Things that could cause this problem include:  Fat and other substances building up in the lining of a tube.  Swelling of the walls of a blood vessel.  Certain tissue diseases.  Belly (abdominal) trauma.  An infection in the main artery of the body.  What increases the risk? There are things that make it more likely for you to have an aneurysm. These include:  Being over the age of 62 years old.  Having high blood pressure (hypertension).  Being a female.  Being white.  Being very overweight (obese).  Having a family history of aneurysm.  Using tobacco products.  What are the signs or symptoms? Symptoms depend on the size of the aneurysm and how fast it grows. There may not be symptoms. If symptoms occur, they can include:  Pain (belly, side, lower back, or groin).  Feeling full after eating a small amount of food.  Feeling sick to your stomach (nauseous), throwing up (vomiting), or both.  Feeling a lump in your belly that feels like it is beating (pulsating).  Feeling like you will pass out (faint).  How is this treated?  Medicine to control blood pressure and pain.  Imaging tests to see if the aneurysm gets bigger.  Surgery. How is this prevented? To lessen your chance of getting this condition:  Stop smoking. Stop chewing tobacco.  Limit or avoid alcohol.  Keep your blood pressure, blood sugar, and cholesterol within normal limits.  Eat less salt.  Eat foods low in saturated fats and cholesterol. These are found in animal and  whole dairy products.  Eat more fiber. Fiber is found in whole grains, vegetables, and fruits.  Keep a healthy weight.  Stay active and exercise often.  This information is not intended to replace advice given to you by your health care provider. Make sure you discuss any questions you have with your health care provider. Document Released: 03/02/2013 Document Revised: 04/12/2016 Document Reviewed: 12/05/2012 Elsevier Interactive Patient Education  2017 Elsevier Inc.  

## 2018-06-24 NOTE — Progress Notes (Signed)
MRN : [id number]  Melissa Montes is a 62 y.o. (29-Sep-1956) female who presents with chief complaint of  Chief Complaint  Patient presents with  . Carotid    53month follow up  .  History of Present Illness: Patient returns today in follow up of multiple vascular issues.  She has been having a fair bit of lightheadedness and low blood pressure.  She says she is also had some chest pain.  I have asked that she follow this up with her cardiologist to discuss further.  Her blood pressure today on rising was a little low, but on recheck I got 122/88.  She has not had any focal neurologic symptoms.  She has mild claudication symptoms but these are not lifestyle limiting.  Her noninvasive studies today show no significant right carotid artery stenosis and stenosis just into the 40 to 59% range in the left carotid artery.  Her aortic duplex shows a stable abdominal aortic aneurysm measuring 3.4 cm in maximal diameter.  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  Current Outpatient Medications  Medication Sig Dispense Refill  . acetaminophen (TYLENOL) 325 MG tablet Take 325-650 mg by mouth every 6 (six) hours as needed (for pain.).    Marland Kitchen apixaban (ELIQUIS) 2.5 MG TABS tablet 2 (two) times daily    . aspirin EC 81 MG tablet Take 162 mg by mouth every evening.     Marland Kitchen atorvastatin (LIPITOR) 80 MG tablet Take 80 mg by mouth every evening.     . Calcium Carbonate-Vitamin D (CALTRATE 600+D PO) Take by mouth daily.    . carbamazepine (TEGRETOL) 200 MG tablet Take 200 mg by mouth daily.     . diazepam (VALIUM) 10 MG tablet Take 10 mg by mouth daily as needed for anxiety.    Marland Kitchen ezetimibe (ZETIA) 10 MG tablet Take 10 mg by mouth daily.     Marland Kitchen FLUoxetine (PROZAC) 40 MG capsule Take 40 mg by mouth daily.     . metoprolol succinate (TOPROL-XL) 25 MG 24 hr tablet Take 25 mg by mouth daily.     . Multiple Vitamin (MULTIVITAMIN) tablet Take 1 tablet by  mouth daily. Centrum Silver for Women    . nitroGLYCERIN (NITROSTAT) 0.4 MG SL tablet Place 0.4 mg under the tongue every 5 (five) minutes x 3 doses as needed for chest pain.     Marland Kitchen ondansetron (ZOFRAN) 4 MG tablet Take 4 mg by mouth every 8 (eight) hours as needed (for nausea/vomiting).     . pantoprazole (PROTONIX) 40 MG tablet Take 40 mg by mouth daily.     . Probiotic Product (RESTORA PO) Take by mouth daily.    . traZODone (DESYREL) 100 MG tablet Take 100 mg by mouth at bedtime.      No current facility-administered medications for this visit.     Past Medical History:  Diagnosis Date  . Anxiety   . Bipolar disorder ([place])   . Depression   . Heart disease   . Hyperlipidemia   . MI (myocardial infarction) [facility])     Past Surgical History:  Procedure Laterality Date  . ABDOMINAL HYSTERECTOMY    . CAROTID ANGIOGRAPHY N/A 01/13/2018   Procedure: CAROTID ANGIOGRAPHY;  Surgeon: Algernon Huxley, MD;  Location: [facility];  Service: Cardiovascular;  Laterality: N/A;  . CORONARY ANGIOPLASTY WITH STENT PLACEMENT      Social History Social History   Tobacco Use  .  Smoking status: Current Every Day Smoker    Packs/day: 0.50    Types: Cigarettes  . Smokeless tobacco: Never Used  Substance Use Topics  . Alcohol use: No  . Drug use: No      Family History Family History  Problem Relation Age of Onset  . Breast cancer Mother 18  . Breast cancer Maternal Aunt        mat aunt and great aunt     Allergies  Allergen Reactions  . Keflex [Cephalexin] Hives   REVIEW OF SYSTEMS (Negative unless checked)  Constitutional: [] Weight loss  [] Fever  [] Chills Cardiac: [] Chest pain   [] Chest pressure   [x] Palpitations   [] Shortness of breath when laying flat   [] Shortness of breath at rest   [x] Shortness of breath with exertion. Vascular:  [] Pain in legs with walking   [] Pain in legs at rest   [] Pain in legs when laying flat   [x] Claudication   [] Pain in feet when walking  [] Pain  in feet at rest  [] Pain in feet when laying flat   [] History of DVT   [] Phlebitis   [] Swelling in legs   [] Varicose veins   [] Non-healing ulcers Pulmonary:   [] Uses home oxygen   [] Productive cough   [] Hemoptysis   [] Wheeze  [] COPD   [] Asthma Neurologic:  [x] Dizziness  [] Blackouts   [] Seizures   [] History of stroke   [] History of TIA  [] Aphasia   [] Temporary blindness   [] Dysphagia   [] Weakness or numbness in arms   [] Weakness or numbness in legs Musculoskeletal:  [] Arthritis   [] Joint swelling   [] Joint pain   [] Low back pain Hematologic:  [] Easy bruising  [] Easy bleeding   [] Hypercoagulable state   [] Anemic   Gastrointestinal:  [] Blood in stool   [] Vomiting blood  [x] Gastroesophageal reflux/heartburn   [] Abdominal pain Genitourinary:  [] Chronic kidney disease   [] Difficult urination  [] Frequent urination  [] Burning with urination   [] Hematuria Skin:  [] Rashes   [] Ulcers   [] Wounds Psychological:  [x] History of anxiety   [x]  History of major depression.    Physical Examination  BP 117/73 (BP Location: Left Arm)   Pulse 66   Resp 16   Ht 5\' 2"  (1.575 m)   Wt 100 lb 12.8 oz (45.7 kg)   BMI 18.44 kg/m  Gen:  WD/WN, NAD Head: [place]/AT, No temporalis wasting. Ear/Nose/Throat: Hearing grossly intact, nares w/o erythema or drainage Eyes: Conjunctiva clear. Sclera non-icteric Neck: Supple.  Trachea midline Pulmonary:  Good air movement, no use of accessory muscles.  Cardiac: irregular Vascular:  Vessel Right Left  Radial Palpable Palpable                          PT 1+ Palpable 1+ Palpable  DP 1+ Palpable 1+ Palpable   Gastrointestinal: soft, non-tender/non-distended. Musculoskeletal: M/S 5/5 throughout.  No deformity or atrophy. No edema. Neurologic: Sensation grossly intact in extremities.  Symmetrical.  Speech is fluent.  Psychiatric: Judgment intact, Mood & affect appropriate for pt's clinical situation. Dermatologic: No rashes or ulcers noted.  No cellulitis or open  wounds.       Labs No results found for this or any previous visit (from the past 2160 hour(s)).  Radiology No results found.  Assessment/Plan Bilateral carotid artery stenosis Checked with angiogram recently and found to be subcritical.    Follow-up duplex today shows no significant stenosis on the right and 40 to 59% stenosis on the left.  Recheck in 1  year.  Hyperlipemia lipid control important in reducing the progression of atherosclerotic disease. Continue statin therapy   AAA (abdominal aortic aneurysm) without rupture ([facility]) 3.4 cm in maximal diameter.  Some associated iliac artery stenosis but her claudication symptoms are mild.  We will continue to recheck on an annual basis.     Leotis Pain, MD  06/24/2018 10:43 AM    This note was created with Dragon medical transcription system.  Any errors from dictation are purely unintentional

## 2018-06-24 NOTE — Assessment & Plan Note (Signed)
3.4 cm in maximal diameter.  Some associated iliac artery stenosis but her claudication symptoms are mild.  We will continue to recheck on an annual basis.

## 2018-07-04 ENCOUNTER — Other Ambulatory Visit: Payer: Self-pay | Admitting: Cardiovascular Disease

## 2018-07-04 DIAGNOSIS — I200 Unstable angina: Secondary | ICD-10-CM | POA: Insufficient documentation

## 2018-07-07 ENCOUNTER — Encounter: Payer: Self-pay | Admitting: *Deleted

## 2018-07-07 ENCOUNTER — Ambulatory Visit
Admission: RE | Admit: 2018-07-07 | Discharge: 2018-07-07 | Disposition: A | Discharge status: Home / Self Care | Payer: Medicare Other | Source: Ambulatory Visit | Attending: Cardiovascular Disease | Admitting: Cardiovascular Disease

## 2018-07-07 ENCOUNTER — Encounter
Admission: RE | Disposition: A | Discharge status: Home / Self Care | Payer: Self-pay | Source: Ambulatory Visit | Attending: Cardiovascular Disease

## 2018-07-07 DIAGNOSIS — Z955 Presence of coronary angioplasty implant and graft: Secondary | ICD-10-CM | POA: Insufficient documentation

## 2018-07-07 DIAGNOSIS — F17210 Nicotine dependence, cigarettes, uncomplicated: Secondary | ICD-10-CM | POA: Insufficient documentation

## 2018-07-07 DIAGNOSIS — I200 Unstable angina: Secondary | ICD-10-CM

## 2018-07-07 DIAGNOSIS — Z90711 Acquired absence of uterus with remaining cervical stump: Secondary | ICD-10-CM | POA: Insufficient documentation

## 2018-07-07 DIAGNOSIS — Z7982 Long term (current) use of aspirin: Secondary | ICD-10-CM | POA: Diagnosis not present

## 2018-07-07 DIAGNOSIS — I25110 Atherosclerotic heart disease of native coronary artery with unstable angina pectoris: Secondary | ICD-10-CM | POA: Diagnosis not present

## 2018-07-07 DIAGNOSIS — I10 Essential (primary) hypertension: Secondary | ICD-10-CM | POA: Insufficient documentation

## 2018-07-07 DIAGNOSIS — I714 Abdominal aortic aneurysm, without rupture: Secondary | ICD-10-CM | POA: Diagnosis not present

## 2018-07-07 DIAGNOSIS — Z79899 Other long term (current) drug therapy: Secondary | ICD-10-CM | POA: Diagnosis not present

## 2018-07-07 DIAGNOSIS — R0602 Shortness of breath: Secondary | ICD-10-CM | POA: Diagnosis not present

## 2018-07-07 DIAGNOSIS — Z9889 Other specified postprocedural states: Secondary | ICD-10-CM | POA: Insufficient documentation

## 2018-07-07 DIAGNOSIS — J449 Chronic obstructive pulmonary disease, unspecified: Secondary | ICD-10-CM | POA: Insufficient documentation

## 2018-07-07 DIAGNOSIS — Z8249 Family history of ischemic heart disease and other diseases of the circulatory system: Secondary | ICD-10-CM | POA: Insufficient documentation

## 2018-07-07 DIAGNOSIS — Z881 Allergy status to other antibiotic agents status: Secondary | ICD-10-CM | POA: Insufficient documentation

## 2018-07-07 DIAGNOSIS — E785 Hyperlipidemia, unspecified: Secondary | ICD-10-CM | POA: Insufficient documentation

## 2018-07-07 DIAGNOSIS — I252 Old myocardial infarction: Secondary | ICD-10-CM | POA: Insufficient documentation

## 2018-07-07 HISTORY — PX: LEFT HEART CATH AND CORONARY ANGIOGRAPHY: CATH118249

## 2018-07-07 HISTORY — PX: CORONARY ARTERY BYPASS GRAFT: SHX141

## 2018-07-07 HISTORY — PX: LEFT HEART CATH: CATH118248

## 2018-07-07 LAB — BASIC METABOLIC PANEL
Anion gap: 8 (ref 5–15)
BUN: 15 mg/dL (ref 8–23)
CO2: 26 mmol/L (ref 22–32)
Calcium: 9 mg/dL (ref 8.9–10.3)
Chloride: 106 mmol/L (ref 98–111)
Creatinine, Ser: 0.74 mg/dL (ref 0.44–1.00)
GFR calc Af Amer: >60 mL/min (ref >60)
GFR calc non Af Amer: >60 mL/min (ref >60)
Glucose, Bld: 108 mg/dL — ABNORMAL HIGH (ref 70–99)
Potassium: 4.3 mmol/L (ref 3.5–5.1)
Sodium: 140 mmol/L (ref 135–145)

## 2018-07-07 LAB — CBC
HCT: 39.4 % (ref 35.0–47.0)
Hemoglobin: 14 g/dL (ref 12.0–16.0)
MCH: 31.8 pg (ref 26.0–34.0)
MCHC: 35.5 g/dL (ref 32.0–36.0)
MCV: 89.6 fL (ref 80.0–100.0)
Platelets: 261 10*3/uL (ref 150–440)
RBC: 4.39 MIL/uL (ref 3.80–5.20)
RDW: 12.7 % (ref 11.5–14.5)
WBC: 10.1 10*3/uL (ref 3.6–11.0)

## 2018-07-07 SURGERY — LEFT HEART CATH
Anesthesia: Moderate Sedation | Laterality: Right

## 2018-07-07 MED ORDER — SODIUM CHLORIDE 0.9 % WEIGHT BASED INFUSION: 1.0000 mL/kg/h | INTRAVENOUS | Status: DC

## 2018-07-07 MED ORDER — MIDAZOLAM HCL 2 MG/2ML IJ SOLN
INTRAMUSCULAR | Status: DC | PRN
  Administered 2018-07-07: 1 mg via INTRAVENOUS

## 2018-07-07 MED ORDER — SODIUM CHLORIDE 0.9% FLUSH: 3.0000 mL | Freq: Two times a day (BID) | INTRAVENOUS | Status: DC

## 2018-07-07 MED ORDER — SODIUM CHLORIDE 0.9% FLUSH: 3.0000 mL | INTRAVENOUS | Status: DC | PRN

## 2018-07-07 MED ORDER — ASPIRIN 81 MG PO CHEW
81.0000 mg | CHEWABLE_TABLET | ORAL | Status: AC
  Administered 2018-07-07: 81 mg via ORAL

## 2018-07-07 MED ORDER — ONDANSETRON HCL 4 MG/2ML IJ SOLN: 4.0000 mg | Freq: Four times a day (QID) | INTRAMUSCULAR | Status: DC | PRN

## 2018-07-07 MED ORDER — FENTANYL CITRATE (PF) 100 MCG/2ML IJ SOLN
INTRAMUSCULAR | Status: DC | PRN
  Administered 2018-07-07 (×2): 25 ug via INTRAVENOUS

## 2018-07-07 MED ORDER — FENTANYL CITRATE (PF) 100 MCG/2ML IJ SOLN
INTRAMUSCULAR | Status: AC
  Filled 2018-07-07: qty 2

## 2018-07-07 MED ORDER — MIDAZOLAM HCL 2 MG/2ML IJ SOLN
INTRAMUSCULAR | Status: AC
  Filled 2018-07-07: qty 2

## 2018-07-07 MED ORDER — ASPIRIN 81 MG PO CHEW
CHEWABLE_TABLET | ORAL | Status: AC
  Filled 2018-07-07: qty 1

## 2018-07-07 MED ORDER — SODIUM CHLORIDE 0.9 % IV SOLN: 250.0000 mL | INTRAVENOUS | Status: DC | PRN

## 2018-07-07 MED ORDER — SODIUM CHLORIDE 0.9 % WEIGHT BASED INFUSION
3.0000 mL/kg/h | INTRAVENOUS | Status: AC
  Administered 2018-07-07: 3 mL/kg/h via INTRAVENOUS

## 2018-07-07 MED ORDER — ACETAMINOPHEN 325 MG PO TABS: 650.0000 mg | ORAL_TABLET | ORAL | Status: DC | PRN

## 2018-07-07 MED ORDER — LIDOCAINE HCL (PF) 1 % IJ SOLN
INTRAMUSCULAR | Status: AC
Start: 2018-07-07 — End: ?
  Filled 2018-07-07: qty 30

## 2018-07-07 MED ORDER — LIDOCAINE HCL (PF) 1 % IJ SOLN
INTRAMUSCULAR | Status: DC | PRN
  Administered 2018-07-07: 15 mL

## 2018-07-07 SURGICAL SUPPLY — 8 items
CATH INFINITI 5FR ANG PIGTAIL (CATHETERS) ×2 IMPLANT
CATH INFINITI 5FR JL4 (CATHETERS) ×2 IMPLANT
CATH INFINITI JR4 5F (CATHETERS) ×2 IMPLANT
KIT MANI 3VAL PERCEP (MISCELLANEOUS) ×2 IMPLANT
NEEDLE PERC 18GX7CM (NEEDLE) ×2 IMPLANT
PACK CARDIAC CATH (CUSTOM PROCEDURE TRAY) ×2 IMPLANT
SHEATH AVANTI 5FR X 11CM (SHEATH) ×2 IMPLANT
WIRE GUIDERIGHT .035X150 (WIRE) ×2 IMPLANT

## 2018-07-07 NOTE — Progress Notes (Signed)
5 FR short sheath DC'd intact by B. White, Therapist, sports. Manual pressure held x 10 min. Hemostasis achieved at 13:28 PM. Pt. Tolerated well. Dr. Humphrey Rolls came by during manual hold and informed pt. To increase Isordil p.o. To BID and take one Tylenol ES po. QD. Pt. Verbalized undrstanding. Pt. To visit MD office tomorrow 8/20 at 1 PM to review need for CABG.

## 2018-07-08 ENCOUNTER — Encounter: Payer: Self-pay | Admitting: Cardiovascular Disease

## 2018-07-09 ENCOUNTER — Inpatient Hospital Stay: Payer: Medicare Other | Admitting: Internal Medicine

## 2018-07-16 DIAGNOSIS — Z951 Presence of aortocoronary bypass graft: Secondary | ICD-10-CM

## 2018-07-16 HISTORY — DX: Presence of aortocoronary bypass graft: Z95.1

## 2018-07-17 DIAGNOSIS — Z951 Presence of aortocoronary bypass graft: Secondary | ICD-10-CM | POA: Insufficient documentation

## 2018-07-25 MED ORDER — GENERIC EXTERNAL MEDICATION
37.50 | Status: DC
Start: 2018-07-24 — End: 2018-07-25

## 2018-07-25 MED ORDER — DIAZEPAM 2 MG PO TABS
2.00 | ORAL_TABLET | ORAL | Status: DC
Start: ? — End: 2018-07-25

## 2018-07-25 MED ORDER — GUAIFENESIN ER 600 MG PO TB12
600.00 | ORAL_TABLET | ORAL | Status: DC
Start: 2018-07-24 — End: 2018-07-25

## 2018-07-25 MED ORDER — MAGNESIUM OXIDE 400 MG PO TABS
800.00 | ORAL_TABLET | ORAL | Status: DC
Start: 2018-07-24 — End: 2018-07-25

## 2018-07-25 MED ORDER — GENERIC EXTERNAL MEDICATION
1.00 | Status: DC
Start: ? — End: 2018-07-25

## 2018-07-25 MED ORDER — NYSTATIN 100000 UNIT/ML MT SUSP
5.00 | OROMUCOSAL | Status: DC
Start: 2018-07-24 — End: 2018-07-25

## 2018-07-25 MED ORDER — ASPIRIN 81 MG PO CHEW
81.00 | CHEWABLE_TABLET | ORAL | Status: DC
Start: 2018-07-25 — End: 2018-07-25

## 2018-07-25 MED ORDER — MELATONIN 3 MG PO TABS
6.00 | ORAL_TABLET | ORAL | Status: DC
Start: 2018-07-24 — End: 2018-07-25

## 2018-07-25 MED ORDER — GABAPENTIN 300 MG PO CAPS
300.00 | ORAL_CAPSULE | ORAL | Status: DC
Start: 2018-07-24 — End: 2018-07-25

## 2018-07-25 MED ORDER — DEXTROSE 50 % IV SOLN
12.50 | INTRAVENOUS | Status: DC
Start: ? — End: 2018-07-25

## 2018-07-25 MED ORDER — ATORVASTATIN CALCIUM 80 MG PO TABS
80.00 | ORAL_TABLET | ORAL | Status: DC
Start: 2018-07-24 — End: 2018-07-25

## 2018-07-25 MED ORDER — LIDOCAINE 5 % EX PTCH
2.00 | MEDICATED_PATCH | CUTANEOUS | Status: DC
Start: 2018-07-24 — End: 2018-07-25

## 2018-07-25 MED ORDER — CARBAMAZEPINE 200 MG PO TABS
200.00 | ORAL_TABLET | ORAL | Status: DC
Start: 2018-07-28 — End: 2018-07-25

## 2018-07-25 MED ORDER — POLYETHYLENE GLYCOL 3350 17 G PO PACK
17.00 | PACK | ORAL | Status: DC
Start: 2018-07-24 — End: 2018-07-25

## 2018-07-25 MED ORDER — ALBUTEROL SULFATE (5 MG/ML) 0.5% IN NEBU
2.50 | INHALATION_SOLUTION | RESPIRATORY_TRACT | Status: DC
Start: ? — End: 2018-07-25

## 2018-07-25 MED ORDER — RIVAROXABAN 20 MG PO TABS
20.00 | ORAL_TABLET | ORAL | Status: DC
Start: 2018-07-24 — End: 2018-07-25

## 2018-07-25 MED ORDER — MULTI-VITAMINS PO TABS
1.00 | ORAL_TABLET | ORAL | Status: DC
Start: 2018-07-25 — End: 2018-07-25

## 2018-07-25 MED ORDER — IPRATROPIUM-ALBUTEROL 0.5-2.5 (3) MG/3ML IN SOLN
3.00 | RESPIRATORY_TRACT | Status: DC
Start: 2018-07-24 — End: 2018-07-25

## 2018-07-25 MED ORDER — EZETIMIBE 10 MG PO TABS
10.00 | ORAL_TABLET | ORAL | Status: DC
Start: 2018-07-25 — End: 2018-07-25

## 2018-07-25 MED ORDER — FUROSEMIDE 40 MG PO TABS
40.00 | ORAL_TABLET | ORAL | Status: DC
Start: 2018-07-24 — End: 2018-07-25

## 2018-07-25 MED ORDER — ACETAMINOPHEN 325 MG PO TABS
650.00 | ORAL_TABLET | ORAL | Status: DC
Start: 2018-07-24 — End: 2018-07-25

## 2018-07-25 MED ORDER — SENNOSIDES-DOCUSATE SODIUM 8.6-50 MG PO TABS
2.00 | ORAL_TABLET | ORAL | Status: DC
Start: 2018-07-24 — End: 2018-07-25

## 2018-07-25 MED ORDER — FLUOXETINE HCL 20 MG PO CAPS
20.00 | ORAL_CAPSULE | ORAL | Status: DC
Start: 2018-07-25 — End: 2018-07-25

## 2018-07-25 MED ORDER — POTASSIUM CHLORIDE ER 20 MEQ PO TBCR
20.00 | EXTENDED_RELEASE_TABLET | ORAL | Status: DC
Start: 2018-07-25 — End: 2018-07-25

## 2018-07-25 MED ORDER — TRAZODONE HCL 100 MG PO TABS
50.00 | ORAL_TABLET | ORAL | Status: DC
Start: 2018-07-24 — End: 2018-07-25

## 2018-07-25 MED ORDER — SALINE NASAL SPRAY 0.65 % NA SOLN
2.00 | NASAL | Status: DC
Start: ? — End: 2018-07-25

## 2018-07-25 MED ORDER — RANITIDINE HCL 150 MG PO TABS
150.00 | ORAL_TABLET | ORAL | Status: DC
Start: 2018-07-25 — End: 2018-07-25

## 2018-07-25 MED ORDER — AMIODARONE HCL 200 MG PO TABS
200.00 | ORAL_TABLET | ORAL | Status: DC
Start: ? — End: 2018-07-25

## 2018-09-11 ENCOUNTER — Ambulatory Visit: Payer: Medicare Other

## 2018-10-01 ENCOUNTER — Inpatient Hospital Stay: Payer: Medicare Other | Attending: Internal Medicine | Admitting: Internal Medicine

## 2018-10-01 ENCOUNTER — Other Ambulatory Visit: Payer: Self-pay

## 2018-10-01 ENCOUNTER — Ambulatory Visit
Admission: RE | Admit: 2018-10-01 | Discharge: 2018-10-01 | Disposition: A | Discharge status: Home / Self Care | Payer: Medicare Other | Source: Ambulatory Visit | Attending: Internal Medicine | Admitting: Internal Medicine

## 2018-10-01 ENCOUNTER — Encounter: Payer: Self-pay | Admitting: Internal Medicine

## 2018-10-01 DIAGNOSIS — Z87891 Personal history of nicotine dependence: Secondary | ICD-10-CM | POA: Insufficient documentation

## 2018-10-01 DIAGNOSIS — J8410 Pulmonary fibrosis, unspecified: Secondary | ICD-10-CM | POA: Insufficient documentation

## 2018-10-01 DIAGNOSIS — F329 Major depressive disorder, single episode, unspecified: Secondary | ICD-10-CM | POA: Insufficient documentation

## 2018-10-01 DIAGNOSIS — Z79899 Other long term (current) drug therapy: Secondary | ICD-10-CM | POA: Diagnosis not present

## 2018-10-01 DIAGNOSIS — R0602 Shortness of breath: Secondary | ICD-10-CM | POA: Diagnosis not present

## 2018-10-01 DIAGNOSIS — D472 Monoclonal gammopathy: Secondary | ICD-10-CM

## 2018-10-01 DIAGNOSIS — I700 Atherosclerosis of aorta: Secondary | ICD-10-CM | POA: Diagnosis not present

## 2018-10-01 DIAGNOSIS — M810 Age-related osteoporosis without current pathological fracture: Secondary | ICD-10-CM | POA: Diagnosis not present

## 2018-10-01 DIAGNOSIS — I739 Peripheral vascular disease, unspecified: Secondary | ICD-10-CM | POA: Insufficient documentation

## 2018-10-01 DIAGNOSIS — I252 Old myocardial infarction: Secondary | ICD-10-CM | POA: Diagnosis not present

## 2018-10-01 DIAGNOSIS — M2550 Pain in unspecified joint: Secondary | ICD-10-CM | POA: Diagnosis not present

## 2018-10-01 DIAGNOSIS — E785 Hyperlipidemia, unspecified: Secondary | ICD-10-CM | POA: Diagnosis not present

## 2018-10-01 DIAGNOSIS — F319 Bipolar disorder, unspecified: Secondary | ICD-10-CM | POA: Diagnosis not present

## 2018-10-01 DIAGNOSIS — R748 Abnormal levels of other serum enzymes: Secondary | ICD-10-CM | POA: Insufficient documentation

## 2018-10-01 DIAGNOSIS — Z7982 Long term (current) use of aspirin: Secondary | ICD-10-CM | POA: Diagnosis not present

## 2018-10-01 NOTE — Progress Notes (Signed)
[facility] NOTE  Patient Care Team: Perrin Maltese, MD as PCP - General (Internal Medicine)  CHIEF COMPLAINTS/PURPOSE OF CONSULTATION: MGUS  # July 2019 MGUS- IgGK [endocrinology; Ms.Blackman]  # Osteoporosis-  # Smoker quit- nov 2019.    # Hx of PVD [Dr.dew]   No history exists.     HISTORY OF PRESENTING ILLNESS:  Melissa Montes 62 y.o.  female long-standing history of smoking recently quit-referred to [place] for further evaluation of abnormal M protein.  Patient has chronic back pain/joint pain not any worse.  Otherwise denies any unusual fatigue or tingling or numbness.  Patient is chronic mild shortness of breath.  Patient never diagnosed with anemia or kidney problems.  Review of Systems  Constitutional: Negative for chills, diaphoresis, fever, malaise/fatigue and weight loss.  HENT: Negative for nosebleeds and sore throat.   Eyes: Negative for double vision.  Respiratory: Negative for cough, hemoptysis, sputum production, shortness of breath and wheezing.   Cardiovascular: Negative for chest pain, palpitations, orthopnea and leg swelling.  Gastrointestinal: Negative for abdominal pain, blood in stool, constipation, diarrhea, heartburn, melena, nausea and vomiting.  Genitourinary: Negative for dysuria, frequency and urgency.  Musculoskeletal: Negative for back pain and joint pain.  Skin: Negative.  Negative for itching and rash.  Neurological: Negative for dizziness, tingling, focal weakness, weakness and headaches.  Endo/Heme/Allergies: Does not bruise/bleed easily.  Psychiatric/Behavioral: Negative for depression. The patient is not nervous/anxious and does not have insomnia.      MEDICAL HISTORY:  Past Medical History:  Diagnosis Date  . Anxiety   . Bipolar disorder ([place])   . Depression   . Heart disease   . Hyperlipidemia   . MI (myocardial infarction) ([place])     SURGICAL HISTORY: Past Surgical History:  Procedure Laterality Date   . ABDOMINAL HYSTERECTOMY    . CAROTID ANGIOGRAPHY N/A 01/13/2018   Procedure: CAROTID ANGIOGRAPHY;  Surgeon: Algernon Huxley, MD;  Location: [facility];  Service: Cardiovascular;  Laterality: N/A;  . CORONARY ANGIOPLASTY WITH STENT PLACEMENT    . LEFT HEART CATH Right 07/07/2018   Procedure: Left Heart Cath and Coronary Angiography;  Surgeon: Dionisio David, MD;  Location: [facility];  Service: Cardiovascular;  Laterality: Right;    SOCIAL HISTORY: Social History   Socioeconomic History  . Marital status: Divorced    Spouse name: Not on file  . Number of children: Not on file  . Years of education: Not on file  . Highest education level: Not on file  Occupational History  . Not on file  Social Needs  . Financial resource strain: Not on file  . Food insecurity:    Worry: Not on file    Inability: Not on file  . Transportation needs:    Medical: Not on file    Non-medical: Not on file  Tobacco Use  . Smoking status: Current Every Day Smoker    Packs/day: 0.50    Types: Cigarettes  . Smokeless tobacco: Never Used  Substance and Sexual Activity  . Alcohol use: No  . Drug use: No  . Sexual activity: Not on file  Lifestyle  . Physical activity:    Days per week: Not on file    Minutes per session: Not on file  . Stress: Not on file  Relationships  . Social connections:    Talks on phone: Not on file    Gets together: Not on file    Attends religious service: Not  on file    Active member of club or organization: Not on file    Attends meetings of clubs or organizations: Not on file    Relationship status: Not on file  . Intimate partner violence:    Fear of current or ex partner: Not on file    Emotionally abused: Not on file    Physically abused: Not on file    Forced sexual activity: Not on file  Other Topics Concern  . Not on file  Social History Narrative  . Not on file    FAMILY HISTORY: Family History  Problem Relation Age of Onset  .  Breast cancer Mother 79  . Breast cancer Maternal Aunt        mat aunt and great aunt    ALLERGIES:  is allergic to keflex [cephalexin].  MEDICATIONS:  Current Outpatient Medications  Medication Sig Dispense Refill  . acetaminophen (TYLENOL) 325 MG tablet Take 325-650 mg by mouth every 6 (six) hours as needed (for pain.).    Marland Kitchen aspirin EC 81 MG tablet Take 162 mg by mouth every evening.     Marland Kitchen atorvastatin (LIPITOR) 80 MG tablet Take 80 mg by mouth every evening.     . baclofen (LIORESAL) 10 MG tablet Take 10 mg by mouth daily as needed for muscle spasms.    . Calcium Carbonate-Vitamin D (CALTRATE 600+D PO) Take 600 mg by mouth 2 (two) times daily.     . diazepam (VALIUM) 10 MG tablet Take 10 mg by mouth daily as needed for anxiety.    Marland Kitchen ezetimibe (ZETIA) 10 MG tablet Take 10 mg by mouth daily.     Marland Kitchen gabapentin (NEURONTIN) 100 MG capsule Take 1 capsule by mouth daily.  2  . metoprolol succinate (TOPROL-XL) 25 MG 24 hr tablet Take 25 mg by mouth daily.     . Multiple Vitamin (MULTIVITAMIN) tablet Take 1 tablet by mouth daily. Centrum Silver for Women    . nitroGLYCERIN (NITROSTAT) 0.4 MG SL tablet Place 0.4 mg under the tongue every 5 (five) minutes x 3 doses as needed for chest pain.     Marland Kitchen ondansetron (ZOFRAN) 4 MG tablet Take 1 tablet by mouth 4 (four) times daily.    . traZODone (DESYREL) 100 MG tablet Take 100 mg by mouth at bedtime.      No current facility-administered medications for this visit.       Marland Kitchen  PHYSICAL EXAMINATION: ECOG PERFORMANCE STATUS: 1 - Symptomatic but completely ambulatory  Vitals:   10/01/18 1504 10/01/18 1513  BP:  94/62  Pulse:  61  Resp: 12   Temp:  (!) 97.2 F (36.2 C)   Filed Weights   10/01/18 1504  Weight: 96 lb (43.5 kg)    Physical Exam  Constitutional: She is oriented to person, place, and time and well-developed, well-nourished, and in no distress.  HENT:  Head: Normocephalic and atraumatic.  Mouth/Throat: Oropharynx is clear and  moist. No oropharyngeal exudate.  Eyes: Pupils are equal, round, and reactive to light.  Neck: Normal range of motion. Neck supple.  Cardiovascular: Normal rate and regular rhythm.  Pulmonary/Chest: No respiratory distress. She has no wheezes.  Abdominal: Soft. Bowel sounds are normal. She exhibits no distension and no mass. There is no tenderness. There is no rebound and no guarding.  Musculoskeletal: Normal range of motion. She exhibits no edema or tenderness.  Neurological: She is alert and oriented to person, place, and time.  Skin: Skin is warm.  Psychiatric:  Affect normal.     LABORATORY DATA:  I have reviewed the data as listed Lab Results  Component Value Date   WBC 10.1 07/07/2018   HGB 14.0 07/07/2018   HCT 39.4 07/07/2018   MCV 89.6 07/07/2018   PLT 261 07/07/2018   Recent Labs    01/13/18 1239 07/07/18 1051  NA  --  140  K  --  4.3  CL  --  106  CO2  --  26  GLUCOSE  --  108*  BUN 19 15  CREATININE 0.51 0.74  CALCIUM  --  9.0  GFRNONAA >60 >60  GFRAA >60 >60    RADIOGRAPHIC STUDIES: I have personally reviewed the radiological images as listed and agreed with the findings in the report. Dg Bone Survey Met  Result Date: 10/02/2018 CLINICAL DATA:  Monoclonal gammopathy of unknown significance. EXAM: METASTATIC BONE SURVEY COMPARISON:  Chest radiography 10/14/2016 FINDINGS: One-view chest: Interval median sternotomy and CABG. Heart size is normal. Aortic atherosclerosis. Chronic emphysema. Pulmonary scarring in the lower lungs. Chronic granuloma in the right mid lung. No bone abnormality seen in the thorax. Lateral cervical spine: Degenerative spondylosis C6-7. No lytic lesions. Lateral skull one view: No lytic lesions AP cervical spine: No lytic lesion Left shoulder one view: Well-marginated lucent region in the greater tuberosity region is more likely benign than related to myeloma/MGUS. This cannot be stated with certainty however. Right shoulder: No lytic  lesion Left humerus: No lytic lesion distal to the shoulder region. Right humerus: No lytic lesion. Left forearm: No lytic lesion. Right forearm: No lytic lesion. Thoracic spine two views: No lytic lesion. No fracture. Ordinary mild degenerative changes. Lumbar spine two views: No lytic lesion. No fracture. Ordinary mild degenerative changes. AP pelvis: No lytic lesion Right femur: No lytic lesion. Left femur: No lytic lesion. Right tib-fib: No lytic lesion. Left tib-fib: No lytic lesion. IMPRESSION: I think the radiographs are negative for evidence of lytic bone lesions. There is a questionable lucency in the greater tuberosity region of the left humerus which could either be a pseudo lesion or benign lucency. As an isolated finding, it would seem unlikely to relate to marrow disease. Electronically Signed   By: Nelson Chimes M.D.   On: 10/02/2018 09:09    ASSESSMENT & PLAN:   MGUS (monoclonal gammopathy of unknown significance) Immunofixation shows IgG monoclonal protein with kappa light chain Specificity.  Not quantified.  # MGUS-long discussion with the patient regarding natural history of MGUS; small risk of progression to multiple myeloma. Patient is less likely at this time patient has any active myeloma-although given renal insufficiency/anemia [see discussion below]  # Smoker/lung cancer screening- quit 2 weeks; congratulated the patient on quitting smoking.  Discussed regarding lung cancer screening.  Patient interested.  We will make a referral.  Thank you for allowing me to participate in the care of your pleasant patient. Please do not hesitate to contact me with questions or concerns in the interim.  # DISPOSITION: # X-rays today- will call- [phone]/cell # Follow up in 6 months/MD; labs- cbc/cmp/MM panel; k/l light chains- 1 week prior  Addendum: Skeletal survey-negative for any concerns for myeloma however left humerus-increased lucency likely benign.  Recommend repeat x-rays in  6 months. I left VM for pt to call us back. Will send my chart message.    Cc; Dr.Neelam Khan/[place].   All questions were answered. The patient knows to call the clinic with any problems, questions or concerns.  Cammie Sickle, MD 10/08/2018 10:05 AM

## 2018-10-01 NOTE — Assessment & Plan Note (Addendum)
Immunofixation shows IgG monoclonal protein with kappa light chain Specificity.  Not quantified.  # MGUS-long discussion with the patient regarding natural history of MGUS; small risk of progression to multiple myeloma. Patient is less likely at this time patient has any active myeloma-although given renal insufficiency/anemia [see discussion below]  # Smoker/lung cancer screening- quit 2 weeks; congratulated the patient on quitting smoking.  Discussed regarding lung cancer screening.  Patient interested.  We will make a referral.  Thank you for allowing me to participate in the care of your pleasant patient. Please do not hesitate to contact me with questions or concerns in the interim.  # DISPOSITION: # X-rays today- will call- [phone]/cell # Follow up in 6 months/MD; labs- cbc/cmp/MM panel; k/l light chains- 1 week prior  Addendum: Skeletal survey-negative for any concerns for myeloma however left humerus-increased lucency likely benign.  Recommend repeat x-rays in 6 months. I left VM for pt to call us back. Will send my chart message.    Cc; Dr.Neelam Khan/[place].

## 2018-10-01 NOTE — Progress Notes (Signed)
Patient here for initial visit. °

## 2018-10-06 ENCOUNTER — Telehealth: Payer: Self-pay | Admitting: *Deleted

## 2018-10-06 NOTE — Telephone Encounter (Signed)
Patient called asking for results of xrays last week CLINICAL DATA:  Monoclonal gammopathy of unknown significance.  EXAM: METASTATIC BONE SURVEY  COMPARISON:  Chest radiography 10/14/2016  FINDINGS: One-view chest: Interval median sternotomy and CABG. Heart size is normal. Aortic atherosclerosis. Chronic emphysema. Pulmonary scarring in the lower lungs. Chronic granuloma in the right mid lung. No bone abnormality seen in the thorax.  Lateral cervical spine: Degenerative spondylosis C6-7. No lytic lesions.  Lateral skull one view: No lytic lesions  AP cervical spine: No lytic lesion  Left shoulder one view: Well-marginated lucent region in the greater tuberosity region is more likely benign than related to myeloma/MGUS. This cannot be stated with certainty however.  Right shoulder: No lytic lesion  Left humerus: No lytic lesion distal to the shoulder region.  Right humerus: No lytic lesion.  Left forearm: No lytic lesion.  Right forearm: No lytic lesion.  Thoracic spine two views: No lytic lesion. No fracture. Ordinary mild degenerative changes.  Lumbar spine two views: No lytic lesion. No fracture. Ordinary mild degenerative changes.  AP pelvis: No lytic lesion  Right femur: No lytic lesion.  Left femur: No lytic lesion.  Right tib-fib: No lytic lesion.  Left tib-fib: No lytic lesion.  IMPRESSION: I think the radiographs are negative for evidence of lytic bone lesions. There is a questionable lucency in the greater tuberosity region of the left humerus which could either be a pseudo lesion or benign lucency. As an isolated finding, it would seem unlikely to relate to marrow disease.   Electronically Signed   By: Nelson Chimes M.D.   On: 10/02/2018 09:09

## 2018-10-08 ENCOUNTER — Telehealth: Payer: Self-pay | Admitting: Internal Medicine

## 2018-10-08 DIAGNOSIS — D472 Monoclonal gammopathy: Secondary | ICD-10-CM

## 2018-10-08 NOTE — Telephone Encounter (Signed)
My chart message sent. Left VM.

## 2018-10-09 ENCOUNTER — Telehealth: Payer: Self-pay | Admitting: *Deleted

## 2018-10-09 NOTE — Telephone Encounter (Signed)
Received referral for low dose lung cancer screening CT scan. Message left at phone number listed in EMR for patient to call me back to facilitate scheduling scan.  

## 2018-10-14 ENCOUNTER — Telehealth: Payer: Self-pay | Admitting: *Deleted

## 2018-10-14 DIAGNOSIS — Z87891 Personal history of nicotine dependence: Secondary | ICD-10-CM

## 2018-10-14 DIAGNOSIS — Z122 Encounter for screening for malignant neoplasm of respiratory organs: Secondary | ICD-10-CM

## 2018-10-14 NOTE — Telephone Encounter (Signed)
Received referral for initial lung cancer screening scan. Contacted patient and obtained smoking history,(former, quit 09/19/18, 41 pack year) as well as answering questions related to screening process. Patient denies signs of lung cancer such as weight loss or hemoptysis. Patient denies comorbidity that would prevent curative treatment if lung cancer were found. Patient is scheduled for shared decision making visit and CT scan on 11/04/18 at 115pm.

## 2018-11-04 ENCOUNTER — Encounter: Payer: Self-pay | Admitting: Nurse Practitioner

## 2018-11-04 ENCOUNTER — Ambulatory Visit
Admission: RE | Admit: 2018-11-04 | Discharge: 2018-11-04 | Disposition: A | Discharge status: Home / Self Care | Payer: Medicare Other | Source: Ambulatory Visit | Attending: Oncology | Admitting: Oncology

## 2018-11-04 ENCOUNTER — Inpatient Hospital Stay: Payer: Medicare Other | Attending: Oncology | Admitting: Nurse Practitioner

## 2018-11-04 DIAGNOSIS — Z87891 Personal history of nicotine dependence: Secondary | ICD-10-CM

## 2018-11-04 DIAGNOSIS — Z122 Encounter for screening for malignant neoplasm of respiratory organs: Secondary | ICD-10-CM

## 2018-11-04 NOTE — Progress Notes (Signed)
In accordance with CMS guidelines, patient has met eligibility criteria including age, absence of signs or symptoms of lung cancer.  Social History   Tobacco Use  . Smoking status: Former Smoker    Packs/day: 1.00    Years: 41.00    Pack years: 41.00    Types: Cigarettes    Last attempt to quit: 09/19/2018    Years since quitting: 0.1  . Smokeless tobacco: Never Used  Substance Use Topics  . Alcohol use: No  . Drug use: No      A shared decision-making session was conducted prior to the performance of CT scan. This includes one or more decision aids, includes benefits and harms of screening, follow-up diagnostic testing, over-diagnosis, false positive rate, and total radiation exposure.   Counseling on the importance of adherence to annual lung cancer LDCT screening, impact of co-morbidities, and ability or willingness to undergo diagnosis and treatment is imperative for compliance of the program.   Counseling on the importance of continued smoking cessation for former smokers; the importance of smoking cessation for current smokers, and information about tobacco cessation interventions have been given to patient including [place] and 1800 quit [place] programs.   Written order for lung cancer screening with LDCT has been given to the patient and any and all questions have been answered to the best of my abilities.    Yearly follow up will be coordinated by Burgess Estelle, Thoracic Navigator.  Beckey Rutter, DNP, AGNP-C [place] [facility] [phone] (work cell) [phone] (office) 11/04/18 4:05 PM

## 2018-11-05 ENCOUNTER — Telehealth: Payer: Self-pay | Admitting: *Deleted

## 2018-11-05 DIAGNOSIS — M810 Age-related osteoporosis without current pathological fracture: Secondary | ICD-10-CM | POA: Insufficient documentation

## 2018-11-05 NOTE — Telephone Encounter (Signed)
Notified patient of LDCT lung cancer screening program results with recommendation for 3 month follow up imaging. Also notified of incidental findings noted below and is encouraged to discuss further with PCP. Patient verbalizes understanding.   IMPRESSION: Lung-RADS 4A, suspicious. Part solid peripheral right lower lobe pulmonary nodule is increased in size and density since 2017 chest CT, suspicious for primary bronchogenic adenocarcinoma. Thoracic surgical consultation suggested. Follow up low-dose chest CT without contrast in 3 months (please use the following order, "CT CHEST LCS NODULE FOLLOW-UP W/O CM") is recommended. Alternatively, PET may be considered when there is a solid component 8 mm or larger.  Aortic Atherosclerosis (ICD10-I70.0) and Emphysema (ICD10-J43.9).

## 2018-11-11 NOTE — Telephone Encounter (Signed)
Thanks, Raquel Sarna! GB

## 2018-11-20 ENCOUNTER — Encounter: Payer: Medicare Other | Attending: Cardiovascular Disease | Admitting: *Deleted

## 2018-11-20 VITALS — Ht 63.0 in | Wt 101.9 lb

## 2018-11-20 DIAGNOSIS — Z951 Presence of aortocoronary bypass graft: Secondary | ICD-10-CM | POA: Insufficient documentation

## 2018-11-20 NOTE — Patient Instructions (Signed)
Patient Instructions  Patient Details  Name: Melissa Montes MRN: [id number] Date of Birth: Feb 22, 1956 Referring Provider:  Dionisio David, MD  Below are your personal goals for exercise, nutrition, and risk factors. Our goal is to help you stay on track towards obtaining and maintaining these goals. We will be discussing your progress on these goals with you throughout the program.  Initial Exercise Prescription: Initial Exercise Prescription - 11/20/18 1400      Date of Initial Exercise RX and Referring Provider   Date  11/20/18    Referring Provider  Humphrey Rolls      Treadmill   MPH  2.4    Grade  2    Minutes  15    METs  3.5      Recumbant Bike   Level  1    RPM  60    Minutes  15    METs  3.5      NuStep   Level  2    SPM  80    Minutes  15    METs  3.5      Prescription Details   Frequency (times per week)  3    Duration  Progress to 45 minutes of aerobic exercise without signs/symptoms of physical distress      Intensity   THRR 40-80% of Max Heartrate  120-145    Ratings of Perceived Exertion  11-13    Perceived Dyspnea  0-4      Resistance Training   Training Prescription  Yes    Weight  2 lb    Reps  10-15       Exercise Goals: Frequency: Be able to perform aerobic exercise two to three times per week in program working toward 2-5 days per week of home exercise.  Intensity: Work with a perceived exertion of 11 (fairly light) - 15 (hard) while following your exercise prescription.  We will make changes to your prescription with you as you progress through the program.   Duration: Be able to do 30 to 45 minutes of continuous aerobic exercise in addition to a 5 minute warm-up and a 5 minute cool-down routine.   Nutrition Goals: Your personal nutrition goals will be established when you do your nutrition analysis with the dietician.  The following are general nutrition guidelines to follow: Cholesterol < 200mg /day Sodium < 1500mg /day Fiber: Women over  50 yrs - 21 grams per day  Personal Goals: Personal Goals and Risk Factors at Admission - 11/20/18 1505      Core Components/Risk Factors/Patient Goals on Admission    Weight Management  Yes;Weight Gain    Intervention  Weight Management: Develop a combined nutrition and exercise program designed to reach desired caloric intake, while maintaining appropriate intake of nutrient and fiber, sodium and fats, and appropriate energy expenditure required for the weight goal.;Weight Management: Provide education and appropriate resources to help participant work on and attain dietary goals.    Admit Weight  101 lb 14.4 oz (46.2 kg)    Goal Weight: Short Term  104 lb (47.2 kg)    Goal Weight: Long Term  115 lb (52.2 kg)    Expected Outcomes  Short Term: Continue to assess and modify interventions until short term weight is achieved;Long Term: Adherence to nutrition and physical activity/exercise program aimed toward attainment of established weight goal;Understanding recommendations for meals to include 15-35% energy as protein, 25-35% energy from fat, 35-60% energy from carbohydrates, less than 200mg  of dietary cholesterol, 20-35 gm  of total fiber daily;Understanding of distribution of calorie intake throughout the day with the consumption of 4-5 meals/snacks;Weight Gain: Understanding of general recommendations for a high calorie, high protein meal plan that promotes weight gain by distributing calorie intake throughout the day with the consumption for 4-5 meals, snacks, and/or supplements    Tobacco Cessation  Yes    Intervention  Assist the participant in steps to quit. Provide individualized education and counseling about committing to Tobacco Cessation, relapse prevention, and pharmacological support that can be provided by physician.;Advice worker, assist with locating and accessing local/national Quit Smoking programs, and support quit date choice.    Expected Outcomes  Short Term: Will  demonstrate readiness to quit, by selecting a quit date.;Short Term: Will quit all tobacco product use, adhering to prevention of relapse plan.;Long Term: Complete abstinence from all tobacco products for at least 12 months from quit date.    Lipids  Yes    Intervention  Provide education and support for participant on nutrition & aerobic/resistive exercise along with prescribed medications to achieve LDL 70mg , HDL >40mg .    Expected Outcomes  Short Term: Participant states understanding of desired cholesterol values and is compliant with medications prescribed. Participant is following exercise prescription and nutrition guidelines.;Long Term: Cholesterol controlled with medications as prescribed, with individualized exercise RX and with personalized nutrition plan. Value goals: LDL < 70mg , HDL > 40 mg.       Tobacco Use Initial Evaluation: Social History   Tobacco Use  Smoking Status Former Smoker  . Packs/day: 1.00  . Years: 41.00  . Pack years: 41.00  . Types: Cigarettes  . Last attempt to quit: 09/19/2018  . Years since quitting: 0.1  Smokeless Tobacco Never Used    Exercise Goals and Review: Exercise Goals    Row Name 11/20/18 1423             Exercise Goals   Increase Physical Activity  Yes       Intervention  Provide advice, education, support and counseling about physical activity/exercise needs.;Develop an individualized exercise prescription for aerobic and resistive training based on initial evaluation findings, risk stratification, comorbidities and participant's personal goals.       Expected Outcomes  Short Term: Attend rehab on a regular basis to increase amount of physical activity.;Long Term: Add in home exercise to make exercise part of routine and to increase amount of physical activity.;Long Term: Exercising regularly at least 3-5 days a week.       Increase Strength and Stamina  Yes       Intervention  Provide advice, education, support and counseling about  physical activity/exercise needs.;Develop an individualized exercise prescription for aerobic and resistive training based on initial evaluation findings, risk stratification, comorbidities and participant's personal goals.       Expected Outcomes  Short Term: Increase workloads from initial exercise prescription for resistance, speed, and METs.;Short Term: Perform resistance training exercises routinely during rehab and add in resistance training at home;Long Term: Improve cardiorespiratory fitness, muscular endurance and strength as measured by increased METs and functional capacity (6MWT)       Able to understand and use rate of perceived exertion (RPE) scale  Yes       Intervention  Provide education and explanation on how to use RPE scale       Expected Outcomes  Short Term: Able to use RPE daily in rehab to express subjective intensity level;Long Term:  Able to use RPE to guide intensity level when exercising  independently       Able to understand and use Dyspnea scale  Yes       Intervention  Provide education and explanation on how to use Dyspnea scale       Expected Outcomes  Short Term: Able to use Dyspnea scale daily in rehab to express subjective sense of shortness of breath during exertion;Long Term: Able to use Dyspnea scale to guide intensity level when exercising independently       Knowledge and understanding of Target Heart Rate Range (THRR)  Yes       Intervention  Provide education and explanation of THRR including how the numbers were predicted and where they are located for reference       Expected Outcomes  Short Term: Able to state/look up THRR;Short Term: Able to use daily as guideline for intensity in rehab;Long Term: Able to use THRR to govern intensity when exercising independently       Able to check pulse independently  Yes       Intervention  Provide education and demonstration on how to check pulse in carotid and radial arteries.;Review the importance of being able to  check your own pulse for safety during independent exercise       Expected Outcomes  Short Term: Able to explain why pulse checking is important during independent exercise;Long Term: Able to check pulse independently and accurately       Understanding of Exercise Prescription  Yes       Intervention  Provide education, explanation, and written materials on patient's individual exercise prescription       Expected Outcomes  Short Term: Able to explain program exercise prescription;Long Term: Able to explain home exercise prescription to exercise independently          Copy of goals given to participant.

## 2018-11-20 NOTE — Progress Notes (Signed)
Daily Session Note  Patient Details  Name: Melissa Montes MRN: [id number] Date of Birth: Feb 03, 1956 Referring Provider:     Cardiac Rehab from 11/20/2018 in [facility]  Referring Provider  Humphrey Rolls      Encounter Date: 11/20/2018  Check In: Session Check In - 11/20/18 1456      Check-In   Supervising physician immediately available to respond to emergencies  See telemetry face sheet for immediately available ER MD    Location  [facility]-Cardiac & Pulmonary Rehab    Staff Present  Nada Maclachlan, BA, ACSM CEP, Exercise Physiologist;Meredith Sherryll Burger, RN BSN    Tobacco Cessation  No Change   quit 09/20/18; slipped up first week of January- had 2 cigs   Warm-up and Cool-down  Not performed (comment)   med review   Resistance Training Performed  Yes    VAD Patient?  No    PAD/SET Patient?  No      Pain Assessment   Currently in Pain?  No/denies        Exercise Prescription Changes - 11/20/18 1400      Response to Exercise   Blood Pressure (Admit)  106/64    Blood Pressure (Exercise)  120/68    Blood Pressure (Exit)  108/66    Heart Rate (Admit)  110 bpm    Heart Rate (Exercise)  126 bpm    Heart Rate (Exit)  100 bpm    Oxygen Saturation (Admit)  93 %    Oxygen Saturation (Exit)  97 %    Rating of Perceived Exertion (Exercise)  11    Perceived Dyspnea (Exercise)  1       Social History   Tobacco Use  Smoking Status Former Smoker  . Packs/day: 1.00  . Years: 41.00  . Pack years: 41.00  . Types: Cigarettes  . Last attempt to quit: 09/19/2018  . Years since quitting: 0.1  Smokeless Tobacco Never Used    Goals Met:  Proper associated with RPD/PD & O2 Sat Exercise tolerated well Personal goals reviewed No report of cardiac concerns or symptoms Strength training completed today  Goals Unmet:  Not Applicable  Comments:Med Review completed   Dr. Emily Filbert is Medical Director for [facility].

## 2018-11-20 NOTE — Progress Notes (Signed)
Cardiac Individual Treatment Plan  Patient Details  Name: Melissa Montes MRN: [id number] Date of Birth: 1956-06-22 Referring Provider:     Cardiac Rehab from 11/20/2018 in [facility] Cardiac and Pulmonary Rehab  Referring Provider  Humphrey Rolls      Initial Encounter Date:    Cardiac Rehab from 11/20/2018 in [facility] Cardiac and Pulmonary Rehab  Date  11/20/18      Visit Diagnosis: S/P CABG x 2  Patient's Home Medications on Admission:  Current Outpatient Medications:  .  acetaminophen (TYLENOL) 325 MG tablet, Take 325-650 mg by mouth every 6 (six) hours as needed (for pain.)., Disp: , Rfl:  .  aspirin EC 81 MG tablet, Take 162 mg by mouth every evening. , Disp: , Rfl:  .  atorvastatin (LIPITOR) 80 MG tablet, Take 80 mg by mouth every evening. , Disp: , Rfl:  .  baclofen (LIORESAL) 10 MG tablet, Take 10 mg by mouth daily as needed for muscle spasms., Disp: , Rfl:  .  Calcium Carbonate-Vitamin D (CALTRATE 600+D PO), Take 600 mg by mouth 2 (two) times daily. , Disp: , Rfl:  .  diazepam (VALIUM) 10 MG tablet, Take 10 mg by mouth daily as needed for anxiety., Disp: , Rfl:  .  ezetimibe (ZETIA) 10 MG tablet, Take 10 mg by mouth daily. , Disp: , Rfl:  .  metoprolol succinate (TOPROL-XL) 25 MG 24 hr tablet, Take 25 mg by mouth daily. , Disp: , Rfl:  .  Multiple Vitamin (MULTIVITAMIN) tablet, Take 1 tablet by mouth daily. Centrum Silver for Women, Disp: , Rfl:  .  nitroGLYCERIN (NITROSTAT) 0.4 MG SL tablet, Place 0.4 mg under the tongue every 5 (five) minutes x 3 doses as needed for chest pain. , Disp: , Rfl:  .  ondansetron (ZOFRAN) 4 MG tablet, Take 1 tablet by mouth 4 (four) times daily., Disp: , Rfl:  .  traZODone (DESYREL) 100 MG tablet, Take 100 mg by mouth at bedtime. , Disp: , Rfl:  .  gabapentin (NEURONTIN) 100 MG capsule, Take 1 capsule by mouth daily., Disp: , Rfl: 2 .  gabapentin (NEURONTIN) 100 MG capsule, Take by mouth., Disp: , Rfl:   Past Medical History: Past Medical History:  Diagnosis  Date  . Anxiety   . Bipolar disorder ([place])   . Depression   . Heart disease   . Hyperlipidemia   . MI (myocardial infarction) ([place])     Tobacco Use: Social History   Tobacco Use  Smoking Status Former Smoker  . Packs/day: 1.00  . Years: 41.00  . Pack years: 41.00  . Types: Cigarettes  . Last attempt to quit: 09/19/2018  . Years since quitting: 0.1  Smokeless Tobacco Never Used    Labs: Recent Review Flowsheet Data    There is no flowsheet data to display.       Exercise Target Goals: Exercise Program Goal: Individual exercise prescription set using results from initial 6 min walk test and THRR while considering  patient's activity barriers and safety.   Exercise Prescription Goal: Initial exercise prescription builds to 30-45 minutes a day of aerobic activity, 2-3 days per week.  Home exercise guidelines will be given to patient during program as part of exercise prescription that the participant will acknowledge.  Activity Barriers & Risk Stratification: Activity Barriers & Cardiac Risk Stratification - 11/20/18 1512      Activity Barriers & Cardiac Risk Stratification   Activity Barriers  Back Problems;Shortness of Breath    Cardiac Risk Stratification  High       6 Minute Walk: 6 Minute Walk    Row Name 11/20/18 1424         6 Minute Walk   Phase  Initial     Distance  1250 feet     Walk Time  6 minutes     # of Rest Breaks  0     MPH  2.4     METS  3.8     RPE  11     Perceived Dyspnea   1     VO2 Peak  13.32     Symptoms  No     Resting HR  95 bpm     Resting BP  106/64     Resting Oxygen Saturation   93 %     Exercise Oxygen Saturation  during 6 min walk  97 %     Max Ex. HR  126 bpm     Max Ex. BP  120/68     2 Minute Post BP  108/66        Oxygen Initial Assessment:   Oxygen Re-Evaluation:   Oxygen Discharge (Final Oxygen Re-Evaluation):   Initial Exercise Prescription: Initial Exercise Prescription - 11/20/18 1400      Date  of Initial Exercise RX and Referring Provider   Date  11/20/18    Referring Provider  Humphrey Rolls      Treadmill   MPH  2.4    Grade  2    Minutes  15    METs  3.5      Recumbant Bike   Level  1    RPM  60    Minutes  15    METs  3.5      NuStep   Level  2    SPM  80    Minutes  15    METs  3.5      Prescription Details   Frequency (times per week)  3    Duration  Progress to 45 minutes of aerobic exercise without signs/symptoms of physical distress      Intensity   THRR 40-80% of Max Heartrate  120-145    Ratings of Perceived Exertion  11-13    Perceived Dyspnea  0-4      Resistance Training   Training Prescription  Yes    Weight  2 lb    Reps  10-15       Perform Capillary Blood Glucose checks as needed.  Exercise Prescription Changes: Exercise Prescription Changes    Row Name 11/20/18 1400             Response to Exercise   Blood Pressure (Admit)  106/64       Blood Pressure (Exercise)  120/68       Blood Pressure (Exit)  108/66       Heart Rate (Admit)  110 bpm       Heart Rate (Exercise)  126 bpm       Heart Rate (Exit)  100 bpm       Oxygen Saturation (Admit)  93 %       Oxygen Saturation (Exit)  97 %       Rating of Perceived Exertion (Exercise)  11       Perceived Dyspnea (Exercise)  1          Exercise Comments:   Exercise Goals and Review: Exercise Goals    Row Name 11/20/18 1423  Exercise Goals   Increase Physical Activity  Yes       Intervention  Provide advice, education, support and counseling about physical activity/exercise needs.;Develop an individualized exercise prescription for aerobic and resistive training based on initial evaluation findings, risk stratification, comorbidities and participant's personal goals.       Expected Outcomes  Short Term: Attend rehab on a regular basis to increase amount of physical activity.;Long Term: Add in home exercise to make exercise part of routine and to increase amount of  physical activity.;Long Term: Exercising regularly at least 3-5 days a week.       Increase Strength and Stamina  Yes       Intervention  Provide advice, education, support and counseling about physical activity/exercise needs.;Develop an individualized exercise prescription for aerobic and resistive training based on initial evaluation findings, risk stratification, comorbidities and participant's personal goals.       Expected Outcomes  Short Term: Increase workloads from initial exercise prescription for resistance, speed, and METs.;Short Term: Perform resistance training exercises routinely during rehab and add in resistance training at home;Long Term: Improve cardiorespiratory fitness, muscular endurance and strength as measured by increased METs and functional capacity (6MWT)       Able to understand and use rate of perceived exertion (RPE) scale  Yes       Intervention  Provide education and explanation on how to use RPE scale       Expected Outcomes  Short Term: Able to use RPE daily in rehab to express subjective intensity level;Long Term:  Able to use RPE to guide intensity level when exercising independently       Able to understand and use Dyspnea scale  Yes       Intervention  Provide education and explanation on how to use Dyspnea scale       Expected Outcomes  Short Term: Able to use Dyspnea scale daily in rehab to express subjective sense of shortness of breath during exertion;Long Term: Able to use Dyspnea scale to guide intensity level when exercising independently       Knowledge and understanding of Target Heart Rate Range (THRR)  Yes       Intervention  Provide education and explanation of THRR including how the numbers were predicted and where they are located for reference       Expected Outcomes  Short Term: Able to state/look up THRR;Short Term: Able to use daily as guideline for intensity in rehab;Long Term: Able to use THRR to govern intensity when exercising independently        Able to check pulse independently  Yes       Intervention  Provide education and demonstration on how to check pulse in carotid and radial arteries.;Review the importance of being able to check your own pulse for safety during independent exercise       Expected Outcomes  Short Term: Able to explain why pulse checking is important during independent exercise;Long Term: Able to check pulse independently and accurately       Understanding of Exercise Prescription  Yes       Intervention  Provide education, explanation, and written materials on patient's individual exercise prescription       Expected Outcomes  Short Term: Able to explain program exercise prescription;Long Term: Able to explain home exercise prescription to exercise independently          Exercise Goals Re-Evaluation :   Discharge Exercise Prescription (Final Exercise Prescription Changes): Exercise Prescription Changes - 11/20/18  1400      Response to Exercise   Blood Pressure (Admit)  106/64    Blood Pressure (Exercise)  120/68    Blood Pressure (Exit)  108/66    Heart Rate (Admit)  110 bpm    Heart Rate (Exercise)  126 bpm    Heart Rate (Exit)  100 bpm    Oxygen Saturation (Admit)  93 %    Oxygen Saturation (Exit)  97 %    Rating of Perceived Exertion (Exercise)  11    Perceived Dyspnea (Exercise)  1       Nutrition:  Target Goals: Understanding of nutrition guidelines, daily intake of sodium <1524m, cholesterol <2049m calories 30% from fat and 7% or less from saturated fats, daily to have 5 or more servings of fruits and vegetables.  Biometrics: Pre Biometrics - 11/20/18 1423      Pre Biometrics   Height  '5\' 3"'  (1.6 m)    Weight  101 lb 14.4 oz (46.2 kg)    Waist Circumference  25.5 inches    Hip Circumference  34.5 inches    Waist to Hip Ratio  0.74 %    BMI (Calculated)  18.06    Single Leg Stand  4.01 seconds        Nutrition Therapy Plan and Nutrition Goals: Nutrition Therapy & Goals -  11/20/18 1510      Intervention Plan   Intervention  Prescribe, educate and counsel regarding individualized specific dietary modifications aiming towards targeted core components such as weight, hypertension, lipid management, diabetes, heart failure and other comorbidities.;Nutrition handout(s) given to patient.    Expected Outcomes  Short Term Goal: Understand basic principles of dietary content, such as calories, fat, sodium, cholesterol and nutrients.;Short Term Goal: A plan has been developed with personal nutrition goals set during dietitian appointment.;Long Term Goal: Adherence to prescribed nutrition plan.       Nutrition Assessments: Nutrition Assessments - 11/20/18 1511      MEDFICTS Scores   Pre Score  64       Nutrition Goals Re-Evaluation:   Nutrition Goals Discharge (Final Nutrition Goals Re-Evaluation):   Psychosocial: Target Goals: Acknowledge presence or absence of significant depression and/or stress, maximize coping skills, provide positive support system. Participant is able to verbalize types and ability to use techniques and skills needed for reducing stress and depression.   Initial Review & Psychosocial Screening: Initial Psych Review & Screening - 11/20/18 1507      Initial Review   Current issues with  Current Depression;History of Depression;Current Anxiety/Panic;Current Stress Concerns    Source of Stress Concerns  Financial;Chronic Illness    Comments  TaRecieas been seeing a therapist for a while but is looking forward to learning more about mental health in our program. She lives alone, on disability. She reports having a support system, but a lot of them blame her smoking on why she needed surgery. 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 She states she was very anxious about coming to orientation but hopes to be able to attend class regularly now that she has met the staff.       Family Dynamics    Good Support System?  Yes   friends, family     Barriers   Psychosocial barriers to participate in program  There are no identifiable barriers or psychosocial needs.;The patient should benefit from training in stress management and relaxation.  Screening Interventions   Interventions  Encouraged to exercise;Program counselor consult;Provide feedback about the scores to participant;To provide support and resources with identified psychosocial needs    Expected Outcomes  Short Term goal: Utilizing psychosocial counselor, staff and physician to assist with identification of specific Stressors or current issues interfering with healing process. Setting desired goal for each stressor or current issue identified.;Long Term Goal: Stressors or current issues are controlled or eliminated.;Short Term goal: Identification and review with participant of any Quality of Life or Depression concerns found by scoring the questionnaire.;Long Term goal: The participant improves quality of Life and PHQ9 Scores as seen by post scores and/or verbalization of changes       Quality of Life Scores:  Quality of Life - 11/20/18 1219      Quality of Life   Select  Quality of Life      Quality of Life Scores   Health/Function Pre  13 %    Socioeconomic Pre  22.67 %    Psych/Spiritual Pre  12.79 %    Family Pre  14.33 %    GLOBAL Pre  15.09 %      Scores of 19 and below usually indicate a poorer quality of life in these areas.  A difference of  2-3 points is a clinically meaningful difference.  A difference of 2-3 points in the total score of the Quality of Life Index has been associated with significant improvement in overall quality of life, self-image, physical symptoms, and general health in studies assessing change in quality of life.  PHQ-9: Recent Review Flowsheet Data    Depression screen [facility] 2/9 11/20/2018   Decreased Interest 1   Down, Depressed, Hopeless 3   PHQ - 2 Score 4   Altered sleeping 1    Tired, decreased energy 3   Change in appetite 2   Feeling bad or failure about yourself  2   Trouble concentrating 1   Moving slowly or fidgety/restless 0   Suicidal thoughts 0   PHQ-9 Score 13   Difficult doing work/chores Somewhat difficult     Interpretation of Total Score  Total Score Depression Severity:  1-4 = Minimal depression, 5-9 = Mild depression, 10-14 = Moderate depression, 15-19 = Moderately severe depression, 20-27 = Severe depression   Psychosocial Evaluation and Intervention:   Psychosocial Re-Evaluation:   Psychosocial Discharge (Final Psychosocial Re-Evaluation):   Vocational Rehabilitation: Provide vocational rehab assistance to qualifying candidates.   Vocational Rehab Evaluation & Intervention: Vocational Rehab - 11/20/18 1507      Initial Vocational Rehab Evaluation & Intervention   Assessment shows need for Vocational Rehabilitation  No       Education: Education Goals: Education classes will be provided on a variety of topics geared toward better understanding of heart health and risk factor modification. Participant will state understanding/return demonstration of topics presented as noted by education test scores.  Learning Barriers/Preferences: Learning Barriers/Preferences - 11/20/18 1506      Learning Barriers/Preferences   Learning Barriers  None    Learning Preferences  Individual Instruction       Education Topics:  AED/CPR: - Group verbal and written instruction with the use of models to demonstrate the basic use of the AED with the basic ABC's of resuscitation.   General Nutrition Guidelines/Fats and Fiber: -Group instruction provided by verbal, written material, models and posters to present the general guidelines for heart healthy nutrition. Gives an explanation and review of dietary fats and fiber.   Controlling Sodium/Reading  Food Labels: -Group verbal and written material supporting the discussion of sodium use in  heart healthy nutrition. Review and explanation with models, verbal and written materials for utilization of the food label.   Exercise Physiology & General Exercise Guidelines: - Group verbal and written instruction with models to review the exercise physiology of the cardiovascular system and associated critical values. Provides general exercise guidelines with specific guidelines to those with heart or lung disease.    Aerobic Exercise & Resistance Training: - Gives group verbal and written instruction on the various components of exercise. Focuses on aerobic and resistive training programs and the benefits of this training and how to safely progress through these programs..   Flexibility, Balance, Mind/Body Relaxation: Provides group verbal/written instruction on the benefits of flexibility and balance training, including mind/body exercise modes such as yoga, pilates and tai chi.  Demonstration and skill practice provided.   Stress and Anxiety: - Provides group verbal and written instruction about the health risks of elevated stress and causes of high stress.  Discuss the correlation between heart/lung disease and anxiety and treatment options. Review healthy ways to manage with stress and anxiety.   Depression: - Provides group verbal and written instruction on the correlation between heart/lung disease and depressed mood, treatment options, and the stigmas associated with seeking treatment.   Anatomy & Physiology of the Heart: - Group verbal and written instruction and models provide basic cardiac anatomy and physiology, with the coronary electrical and arterial systems. Review of Valvular disease and Heart Failure   Cardiac Procedures: - Group verbal and written instruction to review commonly prescribed medications for heart disease. Reviews the medication, class of the drug, and side effects. Includes the steps to properly store meds and maintain the prescription regimen. (beta  blockers and nitrates)   Cardiac Medications I: - Group verbal and written instruction to review commonly prescribed medications for heart disease. Reviews the medication, class of the drug, and side effects. Includes the steps to properly store meds and maintain the prescription regimen.   Cardiac Medications II: -Group verbal and written instruction to review commonly prescribed medications for heart disease. Reviews the medication, class of the drug, and side effects. (all other drug classes)    Go Sex-Intimacy & Heart Disease, Get SMART - Goal Setting: - Group verbal and written instruction through game format to discuss heart disease and the return to sexual intimacy. Provides group verbal and written material to discuss and apply goal setting through the application of the S.M.A.R.T. Method.   Other Matters of the Heart: - Provides group verbal, written materials and models to describe Stable Angina and Peripheral Artery. Includes description of the disease process and treatment options available to the cardiac patient.   Exercise & Equipment Safety: - Individual verbal instruction and demonstration of equipment use and safety with use of the equipment.   Cardiac Rehab from 11/20/2018 in [facility] Cardiac and Pulmonary Rehab  Date  11/20/18  Educator  [facility]  Instruction Review Code  1- Verbalizes Understanding      Infection Prevention: - Provides verbal and written material to individual with discussion of infection control including proper hand washing and proper equipment cleaning during exercise session.   Cardiac Rehab from 11/20/2018 in [facility] Cardiac and Pulmonary Rehab  Date  11/20/18  Educator  [facility]  Instruction Review Code  1- Verbalizes Understanding      Falls Prevention: - Provides verbal and written material to individual with discussion of falls prevention and safety.  Cardiac Rehab from 11/20/2018 in [facility] Cardiac and Pulmonary Rehab  Date  11/20/18  Educator  [facility]   Instruction Review Code  1- Verbalizes Understanding      Diabetes: - Individual verbal and written instruction to review signs/symptoms of diabetes, desired ranges of glucose level fasting, after meals and with exercise. Acknowledge that pre and post exercise glucose checks will be done for 3 sessions at entry of program.   Know Your Numbers and Risk Factors: -Group verbal and written instruction about important numbers in your health.  Discussion of what are risk factors and how they play a role in the disease process.  Review of Cholesterol, Blood Pressure, Diabetes, and BMI and the role they play in your overall health.   Sleep Hygiene: -Provides group verbal and written instruction about how sleep can affect your health.  Define sleep hygiene, discuss sleep cycles and impact of sleep habits. Review good sleep hygiene tips.    Other: -Provides group and verbal instruction on various topics (see comments)   Knowledge Questionnaire Score: Knowledge Questionnaire Score - 11/20/18 1221      Knowledge Questionnaire Score   Pre Score  25/26   correct answers reviewed with [place]      Core Components/Risk Factors/Patient Goals at Admission: Personal Goals and Risk Factors at Admission - 11/20/18 1505      Core Components/Risk Factors/Patient Goals on Admission    Weight Management  Yes;Weight Gain    Intervention  Weight Management: Develop a combined nutrition and exercise program designed to reach desired caloric intake, while maintaining appropriate intake of nutrient and fiber, sodium and fats, and appropriate energy expenditure required for the weight goal.;Weight Management: Provide education and appropriate resources to help participant work on and attain dietary goals.    Admit Weight  101 lb 14.4 oz (46.2 kg)    Goal Weight: Short Term  104 lb (47.2 kg)    Goal Weight: Long Term  115 lb (52.2 kg)    Expected Outcomes  Short Term: Continue to assess and modify interventions  until short term weight is achieved;Long Term: Adherence to nutrition and physical activity/exercise program aimed toward attainment of established weight goal;Understanding recommendations for meals to include 15-35% energy as protein, 25-35% energy from fat, 35-60% energy from carbohydrates, less than 268m of dietary cholesterol, 20-35 gm of total fiber daily;Understanding of distribution of calorie intake throughout the day with the consumption of 4-5 meals/snacks;Weight Gain: Understanding of general recommendations for a high calorie, high protein meal plan that promotes weight gain by distributing calorie intake throughout the day with the consumption for 4-5 meals, snacks, and/or supplements    Tobacco Cessation  Yes    Intervention  Assist the participant in steps to quit. Provide individualized education and counseling about committing to Tobacco Cessation, relapse prevention, and pharmacological support that can be provided by physician.;OAdvice worker assist with locating and accessing local/national Quit Smoking programs, and support quit date choice.    Expected Outcomes  Short Term: Will demonstrate readiness to quit, by selecting a quit date.;Short Term: Will quit all tobacco product use, adhering to prevention of relapse plan.;Long Term: Complete abstinence from all tobacco products for at least 12 months from quit date.    Lipids  Yes    Intervention  Provide education and support for participant on nutrition & aerobic/resistive exercise along with prescribed medications to achieve LDL <728m HDL >4066m   Expected Outcomes  Short Term: Participant states understanding of desired cholesterol values and  is compliant with medications prescribed. Participant is following exercise prescription and nutrition guidelines.;Long Term: Cholesterol controlled with medications as prescribed, with individualized exercise RX and with personalized nutrition plan. Value goals: LDL < 47m, HDL  > 40 mg.       Core Components/Risk Factors/Patient Goals Review:    Core Components/Risk Factors/Patient Goals at Discharge (Final Review):    ITP Comments: ITP Comments    Row Name 11/20/18 1458           ITP Comments  Med Review completed. [facility] IT[facility] created. [facility]          Comments: Initial ITP

## 2018-11-21 DIAGNOSIS — Z9861 Coronary angioplasty status: Secondary | ICD-10-CM | POA: Insufficient documentation

## 2018-11-26 ENCOUNTER — Encounter: Payer: Medicare Other | Admitting: *Deleted

## 2018-11-26 DIAGNOSIS — Z951 Presence of aortocoronary bypass graft: Secondary | ICD-10-CM | POA: Diagnosis not present

## 2018-11-26 NOTE — Progress Notes (Signed)
Daily Session Note  Patient Details  Name: Melissa Montes MRN: [id number] Date of Birth: 1956-05-11 Referring Provider:     Cardiac Rehab from 11/20/2018 in [facility] Cardiac and Pulmonary Rehab  Referring Provider  Humphrey Rolls      Encounter Date: 11/26/2018  Check In: Session Check In - 11/26/18 1615      Check-In   Supervising physician immediately available to respond to emergencies  See telemetry face sheet for immediately available ER MD    Location  [facility]-Cardiac & Pulmonary Rehab    Staff Present  Nada Maclachlan, BA, ACSM CEP, Exercise Physiologist;Carroll Enterkin, RN, BSN;Alylah Blakney Sherryll Burger, RN BSN    Medication changes reported      No    Fall or balance concerns reported     No    Tobacco Cessation  No Change    Warm-up and Cool-down  Performed as group-led Higher education careers adviser Performed  Yes    VAD Patient?  No    PAD/SET Patient?  No      Pain Assessment   Currently in Pain?  No/denies          Social History   Tobacco Use  Smoking Status Former Smoker  . Packs/day: 1.00  . Years: 41.00  . Pack years: 41.00  . Types: Cigarettes  . Last attempt to quit: 09/19/2018  . Years since quitting: 0.1  Smokeless Tobacco Never Used    Goals Met:  Independence with exercise equipment Exercise tolerated well No report of cardiac concerns or symptoms Strength training completed today  Goals Unmet:  Not Applicable  Comments: First full day of exercise!  Patient was oriented to gym and equipment including functions, settings, policies, and procedures.  Patient's individual exercise prescription and treatment plan were reviewed.  All starting workloads were established based on the results of the 6 minute walk test done at initial orientation visit.  The plan for exercise progression was also introduced and progression will be customized based on patient's performance and goals.    Dr. Emily Filbert is Medical Director for [place] and  [facility].

## 2018-11-27 DIAGNOSIS — Z951 Presence of aortocoronary bypass graft: Secondary | ICD-10-CM | POA: Diagnosis not present

## 2018-11-27 NOTE — Progress Notes (Signed)
Daily Session Note  Patient Details  Name: Suzann Lazaro MRN: [id number] Date of Birth: 06/02/1956 Referring Provider:     Cardiac Rehab from 11/20/2018 in [facility] Cardiac and Pulmonary Rehab  Referring Provider  Humphrey Rolls      Encounter Date: 11/27/2018  Check In: Session Check In - 11/27/18 1618      Check-In   Supervising physician immediately available to respond to emergencies  See telemetry face sheet for immediately available ER MD    Location  [facility]-Cardiac & Pulmonary Rehab    Staff Present  Renita Papa, RN Moises Blood, BS, ACSM CEP, Exercise Physiologist;Asami Lambright Tessie Fass RCP,RRT,BSRT    Medication changes reported      Yes    Comments  now takes Duloxetine 60 mg    Fall or balance concerns reported     No    Tobacco Cessation  No Change    Warm-up and Cool-down  Performed as group-led instruction    Resistance Training Performed  Yes    VAD Patient?  No    PAD/SET Patient?  No      Pain Assessment   Currently in Pain?  No/denies          Social History   Tobacco Use  Smoking Status Former Smoker  . Packs/day: 1.00  . Years: 41.00  . Pack years: 41.00  . Types: Cigarettes  . Last attempt to quit: 09/19/2018  . Years since quitting: 0.1  Smokeless Tobacco Never Used    Goals Met:  Independence with exercise equipment Exercise tolerated well No report of cardiac concerns or symptoms Strength training completed today  Goals Unmet:  Not Applicable  Comments: Pt able to follow exercise prescription today without complaint.  Will continue to monitor for progression.    Dr. Emily Filbert is Medical Director for [place] and [facility].

## 2018-12-01 ENCOUNTER — Encounter: Payer: Medicare Other | Admitting: *Deleted

## 2018-12-01 DIAGNOSIS — Z951 Presence of aortocoronary bypass graft: Secondary | ICD-10-CM

## 2018-12-01 NOTE — Progress Notes (Signed)
Daily Session Note  Patient Details  Name: Tymesha Ditmore MRN: [id number] Date of Birth: [birth year] Referring Provider:     Cardiac Rehab from 11/20/2018 in [facility] Cardiac and Pulmonary Rehab  Referring Provider  Humphrey Rolls      Encounter Date: 12/01/2018  Check In: Session Check In - 12/01/18 1540      Check-In   Supervising physician immediately available to respond to emergencies  See telemetry face sheet for immediately available ER MD    Location  [facility]Cardiac [facility] Pulmonary Re[facility]    Staff Present  Gerlene Burdock, RN, Moises Blood, BS, ACSM CEP, Exercise Physiologist    Medication changes reported      No    Fall or balance concerns reported     No    Tobacco Cessation  No Change    Warm-up and Cool-down  Performed as group-led instruction    Resistance Training Performed  Yes    VAD Patient?  No          Social History   Tobacco Use  Smoking Status Former Smoker  . Packs/day: 1.00  . Years: 41.00  . Pack years: 41.00  . Types: Cigarettes  . Last attempt to quit: 09/19/2018  . Years since quitting: 0.2  Smokeless Tobacco Never Used    Goals Met:  Independence with exercise equipment Exercise tolerated well Personal goals reviewed No report of cardiac concerns or symptoms Strength training completed today  Goals Unmet:  Not Applicable  Comments: Pt able to follow exercise prescription today without complaint.  Will continue to monitor for progression.    Dr. Emily Filbert is Medical Director for [place] and [facility].

## 2018-12-01 NOTE — Progress Notes (Signed)
Cardiac Individual Treatment Plan  Patient Details  Name: Melissa Montes MRN: [id number] Date of Birth: 01/31/1956 Referring Provider:     Cardiac Rehab from 11/20/2018 in [facility] Cardiac and Pulmonary Rehab  Referring Provider  Humphrey Rolls      Initial Encounter Date:    Cardiac Rehab from 11/20/2018 in [facility] Cardiac and Pulmonary Rehab  Date  11/20/18      Visit Diagnosis: No diagnosis found.  Patient's Home Medications on Admission:  Current Outpatient Medications:  .  acetaminophen (TYLENOL) 325 MG tablet, Take 325-650 mg by mouth every 6 (six) hours as needed (for pain.)., Disp: , Rfl:  .  aspirin EC 81 MG tablet, Take 162 mg by mouth every evening. , Disp: , Rfl:  .  atorvastatin (LIPITOR) 80 MG tablet, Take 80 mg by mouth every evening. , Disp: , Rfl:  .  baclofen (LIORESAL) 10 MG tablet, Take 10 mg by mouth daily as needed for muscle spasms., Disp: , Rfl:  .  Calcium Carbonate-Vitamin D (CALTRATE 600+D PO), Take 600 mg by mouth 2 (two) times daily. , Disp: , Rfl:  .  diazepam (VALIUM) 10 MG tablet, Take 10 mg by mouth daily as needed for anxiety., Disp: , Rfl:  .  ezetimibe (ZETIA) 10 MG tablet, Take 10 mg by mouth daily. , Disp: , Rfl:  .  gabapentin (NEURONTIN) 100 MG capsule, Take 1 capsule by mouth daily., Disp: , Rfl: 2 .  gabapentin (NEURONTIN) 100 MG capsule, Take by mouth., Disp: , Rfl:  .  metoprolol succinate (TOPROL-XL) 25 MG 24 hr tablet, Take 25 mg by mouth daily. , Disp: , Rfl:  .  Multiple Vitamin (MULTIVITAMIN) tablet, Take 1 tablet by mouth daily. Centrum Silver for Women, Disp: , Rfl:  .  nitroGLYCERIN (NITROSTAT) 0.4 MG SL tablet, Place 0.4 mg under the tongue every 5 (five) minutes x 3 doses as needed for chest pain. , Disp: , Rfl:  .  ondansetron (ZOFRAN) 4 MG tablet, Take 1 tablet by mouth 4 (four) times daily., Disp: , Rfl:  .  traZODone (DESYREL) 100 MG tablet, Take 100 mg by mouth at bedtime. , Disp: , Rfl:   Past Medical History: Past Medical History:   Diagnosis Date  . Anxiety   . Bipolar disorder ([place])   . Depression   . Heart disease   . Hyperlipidemia   . MI (myocardial infarction) ([place])     Tobacco Use: Social History   Tobacco Use  Smoking Status Former Smoker  . Packs/day: 1.00  . Years: 41.00  . Pack years: 41.00  . Types: Cigarettes  . Last attempt to quit: 09/19/2018  . Years since quitting: 0.2  Smokeless Tobacco Never Used    Labs: Recent Review Flowsheet Data    There is no flowsheet data to display.       Exercise Target Goals: Exercise Program Goal: Individual exercise prescription set using results from initial 6 min walk test and THRR while considering  patient's activity barriers and safety.   Exercise Prescription Goal: Initial exercise prescription builds to 30-45 minutes a day of aerobic activity, 2-3 days per week.  Home exercise guidelines will be given to patient during program as part of exercise prescription that the participant will acknowledge.  Activity Barriers & Risk Stratification: Activity Barriers & Cardiac Risk Stratification - 11/20/18 1512      Activity Barriers & Cardiac Risk Stratification   Activity Barriers  Back Problems;Shortness of Breath    Cardiac Risk Stratification  High       6 Minute Walk: 6 Minute Walk    Row Name 11/20/18 1424         6 Minute Walk   Phase  Initial     Distance  1250 feet     Walk Time  6 minutes     # of Rest Breaks  0     MPH  2.4     METS  3.8     RPE  11     Perceived Dyspnea   1     VO2 Peak  13.32     Symptoms  No     Resting HR  95 bpm     Resting BP  106/64     Resting Oxygen Saturation   93 %     Exercise Oxygen Saturation  during 6 min walk  97 %     Max Ex. HR  126 bpm     Max Ex. BP  120/68     2 Minute Post BP  108/66        Oxygen Initial Assessment:   Oxygen Re-Evaluation:   Oxygen Discharge (Final Oxygen Re-Evaluation):   Initial Exercise Prescription: Initial Exercise Prescription - 11/20/18 1400       Date of Initial Exercise RX and Referring Provider   Date  11/20/18    Referring Provider  Humphrey Rolls      Treadmill   MPH  2.4    Grade  2    Minutes  15    METs  3.5      Recumbant Bike   Level  1    RPM  60    Minutes  15    METs  3.5      NuStep   Level  2    SPM  80    Minutes  15    METs  3.5      Prescription Details   Frequency (times per week)  3    Duration  Progress to 45 minutes of aerobic exercise without signs/symptoms of physical distress      Intensity   THRR 40-80% of Max Heartrate  120-145    Ratings of Perceived Exertion  11-13    Perceived Dyspnea  0-4      Resistance Training   Training Prescription  Yes    Weight  2 lb    Reps  10-15       Perform Capillary Blood Glucose checks as needed.  Exercise Prescription Changes: Exercise Prescription Changes    Row Name 11/20/18 1400 11/27/18 0800           Response to Exercise   Blood Pressure (Admit)  106/64  82/60 maybe typo?      Blood Pressure (Exercise)  120/68  130/68      Blood Pressure (Exit)  108/66  118/64      Heart Rate (Admit)  110 bpm  82 bpm      Heart Rate (Exercise)  126 bpm  118 bpm      Heart Rate (Exit)  100 bpm  81 bpm      Oxygen Saturation (Admit)  93 %  -      Oxygen Saturation (Exit)  97 %  -      Rating of Perceived Exertion (Exercise)  11  12      Perceived Dyspnea (Exercise)  1  -      Symptoms  -  leg pain on TM pain  reduced with lower incline / pt has PVD      Comments  -  1st day      Duration  -  Progress to 45 minutes of aerobic exercise without signs/symptoms of physical distress      Intensity  -  THRR unchanged        Progression   Progression  -  Continue to progress workloads to maintain intensity without signs/symptoms of physical distress.      Average METs  -  2        Resistance Training   Training Prescription  -  Yes      Weight  -  2 lb      Reps  -  10-15        Treadmill   MPH  -  1.4      Grade  -  2      Minutes  -  15      METs   -  2.46        NuStep   Level  -  1      SPM  -  80      Minutes  -  15      METs  -  1.6         Exercise Comments: Exercise Comments    Row Name 11/26/18 1616           Exercise Comments  First full day of exercise!  Patient was oriented to gym and equipment including functions, settings, policies, and procedures.  Patient's individual exercise prescription and treatment plan were reviewed.  All starting workloads were established based on the results of the 6 minute walk test done at initial orientation visit.  The plan for exercise progression was also introduced and progression will be customized based on patient's performance and goals.          Exercise Goals and Review: Exercise Goals    Row Name 11/20/18 1423             Exercise Goals   Increase Physical Activity  Yes       Intervention  Provide advice, education, support and counseling about physical activity/exercise needs.;Develop an individualized exercise prescription for aerobic and resistive training based on initial evaluation findings, risk stratification, comorbidities and participant's personal goals.       Expected Outcomes  Short Term: Attend rehab on a regular basis to increase amount of physical activity.;Long Term: Add in home exercise to make exercise part of routine and to increase amount of physical activity.;Long Term: Exercising regularly at least 3-5 days a week.       Increase Strength and Stamina  Yes       Intervention  Provide advice, education, support and counseling about physical activity/exercise needs.;Develop an individualized exercise prescription for aerobic and resistive training based on initial evaluation findings, risk stratification, comorbidities and participant's personal goals.       Expected Outcomes  Short Term: Increase workloads from initial exercise prescription for resistance, speed, and METs.;Short Term: Perform resistance training exercises routinely during rehab and add  in resistance training at home;Long Term: Improve cardiorespiratory fitness, muscular endurance and strength as measured by increased METs and functional capacity (6MWT)       Able to understand and use rate of perceived exertion (RPE) scale  Yes       Intervention  Provide education and explanation on how to use RPE scale  Expected Outcomes  Short Term: Able to use RPE daily in rehab to express subjective intensity level;Long Term:  Able to use RPE to guide intensity level when exercising independently       Able to understand and use Dyspnea scale  Yes       Intervention  Provide education and explanation on how to use Dyspnea scale       Expected Outcomes  Short Term: Able to use Dyspnea scale daily in rehab to express subjective sense of shortness of breath during exertion;Long Term: Able to use Dyspnea scale to guide intensity level when exercising independently       Knowledge and understanding of Target Heart Rate Range (THRR)  Yes       Intervention  Provide education and explanation of THRR including how the numbers were predicted and where they are located for reference       Expected Outcomes  Short Term: Able to state/look up THRR;Short Term: Able to use daily as guideline for intensity in rehab;Long Term: Able to use THRR to govern intensity when exercising independently       Able to check pulse independently  Yes       Intervention  Provide education and demonstration on how to check pulse in carotid and radial arteries.;Review the importance of being able to check your own pulse for safety during independent exercise       Expected Outcomes  Short Term: Able to explain why pulse checking is important during independent exercise;Long Term: Able to check pulse independently and accurately       Understanding of Exercise Prescription  Yes       Intervention  Provide education, explanation, and written materials on patient's individual exercise prescription       Expected Outcomes   Short Term: Able to explain program exercise prescription;Long Term: Able to explain home exercise prescription to exercise independently          Exercise Goals Re-Evaluation : Exercise Goals Re-Evaluation    Row Name 11/26/18 1616             Exercise Goal Re-Evaluation   Comments  Reviewed RPE scale, THR and program prescription with pt today.  Pt voiced understanding and was given a copy of goals to take home.        Expected Outcomes  Short: Use RPE daily to regulate intensity. Long: Follow program prescription in [facility]          Discharge Exercise Prescription (Final Exercise Prescription Changes): Exercise Prescription Changes - 11/27/18 0800      Response to Exercise   Blood Pressure (Admit)  82/60   maybe typo?   Blood Pressure (Exercise)  130/68    Blood Pressure (Exit)  118/64    Heart Rate (Admit)  82 bpm    Heart Rate (Exercise)  118 bpm    Heart Rate (Exit)  81 bpm    Rating of Perceived Exertion (Exercise)  12    Symptoms  leg pain on TM   pain reduced with lower incline / pt has PVD   Comments  1st day    Duration  Progress to 45 minutes of aerobic exercise without signs/symptoms of physical distress    Intensity  THRR unchanged      Progression   Progression  Continue to progress workloads to maintain intensity without signs/symptoms of physical distress.    Average METs  2      Resistance Training   Training Prescription  Yes  Weight  2 lb    Reps  10-15      Treadmill   MPH  1.4    Grade  2    Minutes  15    METs  2.46      NuStep   Level  1    SPM  80    Minutes  15    METs  1.6       Nutrition:  Target Goals: Understanding of nutrition guidelines, daily intake of sodium <1575m, cholesterol <2024m calories 30% from fat and 7% or less from saturated fats, daily to have 5 or more servings of fruits and vegetables.  Biometrics: Pre Biometrics - 11/20/18 1423      Pre Biometrics   Height  5' 3" (1.6 m)    Weight  101 lb 14.4 oz (46.2  kg)    Waist Circumference  25.5 inches    Hip Circumference  34.5 inches    Waist to Hip Ratio  0.74 %    BMI (Calculated)  18.06    Single Leg Stand  4.01 seconds        Nutrition Therapy Plan and Nutrition Goals: Nutrition Therapy & Goals - 11/20/18 1510      Intervention Plan   Intervention  Prescribe, educate and counsel regarding individualized specific dietary modifications aiming towards targeted core components such as weight, hypertension, lipid management, diabetes, heart failure and other comorbidities.;Nutrition handout(s) given to patient.    Expected Outcomes  Short Term Goal: Understand basic principles of dietary content, such as calories, fat, sodium, cholesterol and nutrients.;Short Term Goal: A plan has been developed with personal nutrition goals set during dietitian appointment.;Long Term Goal: Adherence to prescribed nutrition plan.       Nutrition Assessments: Nutrition Assessments - 11/20/18 1511      MEDFICTS Scores   Pre Score  64       Nutrition Goals Re-Evaluation: Nutrition Goals Re-Evaluation    [place] 12/01/18 1546             Goals   Nutrition Goal  Eat healthier including a vegetable with each meal       Expected Outcome  Better HEart healthy nutrition that she can afford          Nutrition Goals Discharge (Final Nutrition Goals Re-Evaluation): Nutrition Goals Re-Evaluation - 12/01/18 1546      Goals   Nutrition Goal  Eat healthier including a vegetable with each meal    Expected Outcome  Better HEart healthy nutrition that she can afford       Psychosocial: Target Goals: Acknowledge presence or absence of significant depression and/or stress, maximize coping skills, provide positive support system. Participant is able to verbalize types and ability to use techniques and skills needed for reducing stress and depression.   Initial Review & Psychosocial Screening: Initial Psych Review & Screening - 11/20/18 1507      Initial  Review   Current issues with  Current Depression;History of Depression;Current Anxiety/Panic;Current Stress Concerns    Source of Stress Concerns  Financial;Chronic Illness    Comments  TaWadieas been seeing a therapist for a while but is looking forward to learning more about mental health in our program. She lives alone, on disability. She reports having a support system, but a lot of them blame her smoking on why she needed surgery. 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 She states she was very anxious  about coming to orientation but hopes to be able to attend class regularly now that she has met the staff.       Family Dynamics   Good Support System?  Yes   friends, family     Barriers   Psychosocial barriers to participate in program  There are no identifiable barriers or psychosocial needs.;The patient should benefit from training in stress management and relaxation.      Screening Interventions   Interventions  Encouraged to exercise;Program counselor consult;Provide feedback about the scores to participant;To provide support and resources with identified psychosocial needs    Expected Outcomes  Short Term goal: Utilizing psychosocial counselor, staff and physician to assist with identification of specific Stressors or current issues interfering with healing process. Setting desired goal for each stressor or current issue identified.;Long Term Goal: Stressors or current issues are controlled or eliminated.;Short Term goal: Identification and review with participant of any Quality of Life or Depression concerns found by scoring the questionnaire.;Long Term goal: The participant improves quality of Life and PHQ9 Scores as seen by post scores and/or verbalization of changes       Quality of Life Scores:  Quality of Life - 11/20/18 1219      Quality of Life   Select  Quality of Life      Quality of Life Scores   Health/Function Pre   13 %    Socioeconomic Pre  22.67 %    Psych/Spiritual Pre  12.79 %    Family Pre  14.33 %    GLOBAL Pre  15.09 %      Scores of 19 and below usually indicate a poorer quality of life in these areas.  A difference of  2-3 points is a clinically meaningful difference.  A difference of 2-3 points in the total score of the Quality of Life Index has been associated with significant improvement in overall quality of life, self-image, physical symptoms, and general health in studies assessing change in quality of life.  PHQ-9: Recent Review Flowsheet Data    Depression screen [facility] 2/9 11/20/2018   Decreased Interest 1   Down, Depressed, Hopeless 3   PHQ - 2 Score 4   Altered sleeping 1   Tired, decreased energy 3   Change in appetite 2   Feeling bad or failure about yourself  2   Trouble concentrating 1   Moving slowly or fidgety/restless 0   Suicidal thoughts 0   PHQ-9 Score 13   Difficult doing work/chores Somewhat difficult     Interpretation of Total Score  Total Score Depression Severity:  1-4 = Minimal depression, 5-9 = Mild depression, 10-14 = Moderate depression, 15-19 = Moderately severe depression, 20-27 = Severe depression   Psychosocial Evaluation and Intervention:   Psychosocial Re-Evaluation: Psychosocial Re-Evaluation    [place] Name 12/01/18 1542             Psychosocial Re-Evaluation   Current issues with  Current Depression;Current Stress Concerns       Comments  Leticia said she started on a new antidepressant last week. Kersten was given printed info about the Women Heart Support group and may try to attend tomorrow. Salwa said she has been going to a Clinical cytogeneticist but not sure they have been helping her lately.. I suggested that she talk to our counselor for any suggestions about other counselors. Adalind says has lives alone and has anxiety about even when her heart rate goes up.  Expected Outcomes  Stress techniques like pursed lip breathing. Her new antidepressant  to help her more.        Comments  Merri said she lives on $850/month disability and gets only $60 food stamps. I suggested the [facility] which serves $4 lunches.          Initial Review   Source of Stress Concerns  Poor Coping Skills;Financial          Psychosocial Discharge (Final Psychosocial Re-Evaluation): Psychosocial Re-Evaluation - 12/01/18 1542      Psychosocial Re-Evaluation   Current issues with  Current Depression;Current Stress Concerns    Comments  Lucylle said she started on a new antidepressant last week. Aiysha was given printed info about the Women Heart Support group and may try to attend tomorrow. Xoie said she has been going to a Clinical cytogeneticist but not sure they have been helping her lately.. I suggested that she talk to our counselor for any suggestions about other counselors. Caleen says has lives alone and has anxiety about even when her heart rate goes up.    Expected Outcomes  Stress techniques like pursed lip breathing. Her new antidepressant to help her more.     Comments  Anett said she lives on $850/month disability and gets only $60 food stamps. I suggested the [facility] which serves $4 lunches.       Initial Review   Source of Stress Concerns  Poor Coping Skills;Financial       Vocational Rehabilitation: Provide vocational rehab assistance to qualifying candidates.   Vocational Rehab Evaluation & Intervention: Vocational Rehab - 11/20/18 1507      Initial Vocational Rehab Evaluation & Intervention   Assessment shows need for Vocational Rehabilitation  No       Education: Education Goals: Education classes will be provided on a variety of topics geared toward better understanding of heart health and risk factor modification. Participant will state understanding/return demonstration of topics presented as noted by education test scores.  Learning Barriers/Preferences: Learning Barriers/Preferences - 11/20/18 1506      Learning  Barriers/Preferences   Learning Barriers  None    Learning Preferences  Individual Instruction       Education Topics:  AED/CPR: - Group verbal and written instruction with the use of models to demonstrate the basic use of the AED with the basic ABC's of resuscitation.   General Nutrition Guidelines/Fats and Fiber: -Group instruction provided by verbal, written material, models and posters to present the general guidelines for heart healthy nutrition. Gives an explanation and review of dietary fats and fiber.   Cardiac Rehab from 12/01/2018 in [facility] Cardiac and Pulmonary Rehab  Date  12/01/18  Educator  Lattie Haw RD  Instruction Review Code  1- Verbalizes Understanding      Controlling Sodium/Reading Food Labels: -Group verbal and written material supporting the discussion of sodium use in heart healthy nutrition. Review and explanation with models, verbal and written materials for utilization of the food label.   Exercise Physiology & General Exercise Guidelines: - Group verbal and written instruction with models to review the exercise physiology of the cardiovascular system and associated critical values. Provides general exercise guidelines with specific guidelines to those with heart or lung disease.    Aerobic Exercise & Resistance Training: - Gives group verbal and written instruction on the various components of exercise. Focuses on aerobic and resistive training programs and the benefits of this training and how to safely progress through these programs.Marland Kitchen  Flexibility, Balance, Mind/Body Relaxation: Provides group verbal/written instruction on the benefits of flexibility and balance training, including mind/body exercise modes such as yoga, pilates and tai chi.  Demonstration and skill practice provided.   Stress and Anxiety: - Provides group verbal and written instruction about the health risks of elevated stress and causes of high stress.  Discuss the correlation between  heart/lung disease and anxiety and treatment options. Review healthy ways to manage with stress and anxiety.   Depression: - Provides group verbal and written instruction on the correlation between heart/lung disease and depressed mood, treatment options, and the stigmas associated with seeking treatment.   Cardiac Rehab from 12/01/2018 in [facility] Cardiac and Pulmonary Rehab  Date  11/26/18  Educator  [facility]  Instruction Review Code  1- Verbalizes Understanding      Anatomy & Physiology of the Heart: - Group verbal and written instruction and models provide basic cardiac anatomy and physiology, with the coronary electrical and arterial systems. Review of Valvular disease and Heart Failure   Cardiac Procedures: - Group verbal and written instruction to review commonly prescribed medications for heart disease. Reviews the medication, class of the drug, and side effects. Includes the steps to properly store meds and maintain the prescription regimen. (beta blockers and nitrates)   Cardiac Medications I: - Group verbal and written instruction to review commonly prescribed medications for heart disease. Reviews the medication, class of the drug, and side effects. Includes the steps to properly store meds and maintain the prescription regimen.   Cardiac Medications II: -Group verbal and written instruction to review commonly prescribed medications for heart disease. Reviews the medication, class of the drug, and side effects. (all other drug classes)    Go Sex-Intimacy & Heart Disease, Get SMART - Goal Setting: - Group verbal and written instruction through game format to discuss heart disease and the return to sexual intimacy. Provides group verbal and written material to discuss and apply goal setting through the application of the S.M.A.R.T. Method.   Other Matters of the Heart: - Provides group verbal, written materials and models to describe Stable Angina and Peripheral Artery. Includes  description of the disease process and treatment options available to the cardiac patient.   Exercise & Equipment Safety: - Individual verbal instruction and demonstration of equipment use and safety with use of the equipment.   Cardiac Rehab from 12/01/2018 in [facility] Cardiac and Pulmonary Rehab  Date  11/20/18  Educator  [facility]  Instruction Review Code  1- Verbalizes Understanding      Infection Prevention: - Provides verbal and written material to individual with discussion of infection control including proper hand washing and proper equipment cleaning during exercise session.   Cardiac Rehab from 12/01/2018 in [facility] Cardiac and Pulmonary Rehab  Date  11/20/18  Educator  [facility]  Instruction Review Code  1- Verbalizes Understanding      Falls Prevention: - Provides verbal and written material to individual with discussion of falls prevention and safety.   Cardiac Rehab from 12/01/2018 in [place] [facility]  Date  11/20/18  Educator  [facility]  Instruction Review Code  1- Verbalizes Understanding      Diabetes: - Individual verbal and written instruction to review signs/symptoms of diabetes, desired ranges of glucose level fasting, after meals and with exercise. Acknowledge that pre and post exercise glucose checks will be done for 3 sessions at entry of program.   Know Your Numbers and Risk Factors: -Group verbal and written instruction about important numbers  in your health.  Discussion of what are risk factors and how they play a role in the disease process.  Review of Cholesterol, Blood Pressure, Diabetes, and BMI and the role they play in your overall health.   Sleep Hygiene: -Provides group verbal and written instruction about how sleep can affect your health.  Define sleep hygiene, discuss sleep cycles and impact of sleep habits. Review good sleep hygiene tips.    Other: -Provides group and verbal instruction on various topics (see comments)   Knowledge Questionnaire  Score: Knowledge Questionnaire Score - 11/20/18 1221      Knowledge Questionnaire Score   Pre Score  25/26   correct answers reviewed with Elizabeht      Core Components/Risk Factors/Patient Goals at Admission: Personal Goals and Risk Factors at Admission - 11/20/18 1505      Core Components/Risk Factors/Patient Goals on Admission    Weight Management  Yes;Weight Gain    Intervention  Weight Management: Develop a combined nutrition and exercise program designed to reach desired caloric intake, while maintaining appropriate intake of nutrient and fiber, sodium and fats, and appropriate energy expenditure required for the weight goal.;Weight Management: Provide education and appropriate resources to help participant work on and attain dietary goals.    Admit Weight  101 lb 14.4 oz (46.2 kg)    Goal Weight: Short Term  104 lb (47.2 kg)    Goal Weight: Long Term  115 lb (52.2 kg)    Expected Outcomes  Short Term: Continue to assess and modify interventions until short term weight is achieved;Long Term: Adherence to nutrition and physical activity/exercise program aimed toward attainment of established weight goal;Understanding recommendations for meals to include 15-35% energy as protein, 25-35% energy from fat, 35-60% energy from carbohydrates, less than 226m of dietary cholesterol, 20-35 gm of total fiber daily;Understanding of distribution of calorie intake throughout the day with the consumption of 4-5 meals/snacks;Weight Gain: Understanding of general recommendations for a high calorie, high protein meal plan that promotes weight gain by distributing calorie intake throughout the day with the consumption for 4-5 meals, snacks, and/or supplements    Tobacco Cessation  Yes    Intervention  Assist the participant in steps to quit. Provide individualized education and counseling about committing to Tobacco Cessation, relapse prevention, and pharmacological support that can be provided by  physician.;OAdvice worker assist with locating and accessing local/national Quit Smoking programs, and support quit date choice.    Expected Outcomes  Short Term: Will demonstrate readiness to quit, by selecting a quit date.;Short Term: Will quit all tobacco product use, adhering to prevention of relapse plan.;Long Term: Complete abstinence from all tobacco products for at least 12 months from quit date.    Lipids  Yes    Intervention  Provide education and support for participant on nutrition & aerobic/resistive exercise along with prescribed medications to achieve LDL <730m HDL >4085m   Expected Outcomes  Short Term: Participant states understanding of desired cholesterol values and is compliant with medications prescribed. Participant is following exercise prescription and nutrition guidelines.;Long Term: Cholesterol controlled with medications as prescribed, with individualized exercise RX and with personalized nutrition plan. Value goals: LDL < 20m59mDL > 40 mg.       Core Components/Risk Factors/Patient Goals Review:    Core Components/Risk Factors/Patient Goals at Discharge (Final Review):    ITP Comments: ITP Comments    Row Name 11/20/18 1458           ITP Comments  Med  Review completed. Initial ITP created. Diagnosis can be found in [facility] 8/28          Comments:

## 2018-12-01 NOTE — Progress Notes (Signed)
Daily Session Note  Patient Details  Name: Melissa Montes MRN: [id number] Date of Birth: [birth year] Referring Provider:     Cardiac Rehab from 11/20/2018 in [facility]  Referring Provider  Humphrey Rolls      Encounter Date: 12/01/2018  Check In: Session Check In - 12/01/18 1540      Check-In   Supervising physician immediately available to respond to emergencies  See telemetry face sheet for immediately available ER MD    Location  [facility]-Cardiac & Pulmonary Rehab    Staff Present  Gerlene Burdock, RN, Moises Blood, BS, ACSM CEP, Exercise Physiologist    Medication changes reported      No    Fall or balance concerns reported     No    Tobacco Cessation  No Change    Warm-up and Cool-down  Performed as group-led instruction    Resistance Training Performed  Yes    VAD Patient?  No          Social History   Tobacco Use  Smoking Status Former Smoker  . Packs/day: 1.00  . Years: 41.00  . Pack years: 41.00  . Types: Cigarettes  . Last attempt to quit: 09/19/2018  . Years since quitting: 0.2  Smokeless Tobacco Never Used    Goals Met:  Proper associated with RPD/PD & O2 Sat Exercise tolerated well No report of cardiac concerns or symptoms Strength training completed today  Goals Unmet:  Not Applicable  Comments:     Dr. Emily Filbert is Medical Director for [place] and [facility].

## 2018-12-03 ENCOUNTER — Telehealth: Payer: Self-pay | Admitting: *Deleted

## 2018-12-03 NOTE — Telephone Encounter (Signed)
Adison called and said "today is not a good day so please tell Lucianne Lei" I can't meet with her today but hope to be back tomorrow.

## 2018-12-04 DIAGNOSIS — Z951 Presence of aortocoronary bypass graft: Secondary | ICD-10-CM

## 2018-12-04 NOTE — Progress Notes (Signed)
Daily Session Note  Patient Details  Name: Melissa Montes MRN: [id number] Date of Birth: 06/14/1956 Referring Provider:     Cardiac Rehab from 11/20/2018 in [facility] Re[facility]  Referring Provider  Khan      Encounter Date: 12/04/2018  Check In: Session Check In - 12/04/18 1637      Check-In   Supervising physician immediately available to respond to emergencies  See telemetry face sheet for immediately available ER MD    Location  [facility]    Staff Present  Meredith Craven, RN BSN;Joseph Hood RCP,RRT,BSRT;Kelly Hayes, BS, ACSM CEP, Exercise Physiologist    Medication changes reported      No    Fall or balance concerns reported     No    Warm-up and Cool-down  Performed as group-led instruction    Resistance Training Performed  Yes    VAD Patient?  No    PAD/SET Patient?  No      Pain Assessment   Currently in Pain?  No/denies          Social History   Tobacco Use  Smoking Status Former Smoker  . Packs/day: 1.00  . Years: 41.00  . Pack years: 41.00  . Types: Cigarettes  . Last attempt to quit: 09/19/2018  . Years since quitting: 0.2  Smokeless Tobacco Never Used    Goals Met:  Independence with exercise equipment Exercise tolerated well No report of cardiac concerns or symptoms Strength training completed today  Goals Unmet:  Not Applicable  Comments: Pt able to follow exercise prescription today without complaint.  Will continue to monitor for progression.    Dr. Mark Miller is Medical Director for [facility] and [facility]. 

## 2018-12-08 DIAGNOSIS — Z951 Presence of aortocoronary bypass graft: Secondary | ICD-10-CM

## 2018-12-08 NOTE — Progress Notes (Signed)
Daily Session Note  Patient Details  Name: Melissa Montes MRN: [id number] Date of Birth: [birth year] Referring Provider:     Cardiac Rehab from 11/20/2018 in [facility] and Pul[facility] Re[facility]  Referring Provider  Dorthula Perfect Date: 12/08/2018  Check In: Session Check In - 12/08/18 1736      Check-In   Supervising physician immediately available to respond to emergencies  See telemetry face sheet for immediately available ER MD    Location  [facility]    Staff Present  Gerlene Burdock, RN, Moises Blood, BS, ACSM CEP, Exercise Physiologist;Culley Hedeen Oletta Darter, [place], ACSM CEP, Exercise Physiologist    Medication changes reported      No    Fall or balance concerns reported     No    Warm-up and Cool-down  Performed as group-led instruction    Resistance Training Performed  Yes    VAD Patient?  No    PAD/SET Patient?  No      Pain Assessment   Currently in Pain?  No/denies    Multiple Pain Sites  No          Social History   Tobacco Use  Smoking Status Former Smoker  . Packs/day: 1.00  . Years: 41.00  . Pack years: 41.00  . Types: Cigarettes  . Last attempt to quit: 09/19/2018  . Years since quitting: 0.2  Smokeless Tobacco Never Used    Goals Met:  Independence with exercise equipment Exercise tolerated well No report of cardiac concerns or symptoms Strength training completed today  Goals Unmet:  Not Applicable  Comments: Pt able to follow exercise prescription today without complaint.  Will continue to monitor for progression.    Dr. Emily Filbert is Medical Director for [place] and [facility].

## 2018-12-10 ENCOUNTER — Encounter: Payer: Medicare Other | Admitting: *Deleted

## 2018-12-10 DIAGNOSIS — Z951 Presence of aortocoronary bypass graft: Secondary | ICD-10-CM | POA: Diagnosis not present

## 2018-12-10 NOTE — Progress Notes (Signed)
Daily Session Note  Patient Details  Name: Melissa Montes MRN: [id number] Date of Birth: 10/07/1956 Referring Provider:     Cardiac Rehab from 11/20/2018 in [facility] Card[facility] and Pulmonary Rehab  Referring Provider  Humphrey Rolls      Encounter Date: 12/10/2018  Check In: Session Check In - 12/10/18 1726      Check-In   Supervising physician immediately available to respond to emergencies  See telemetry face sheet for immediately available ER MD    Location  [facility]    Staff Present  Renita Papa, RN BSN;Carroll Enterkin, RN, Vickki Hearing, BA, ACSM CEP, Exercise Physiologist    Medication changes reported      No    Fall or balance concerns reported     No    Tobacco Cessation  No Change    Warm-up and Cool-down  Performed as group-led instruction    Resistance Training Performed  Yes    VAD Patient?  No    PAD/SET Patient?  No      Pain Assessment   Currently in Pain?  No/denies          Social History   Tobacco Use  Smoking Status Former Smoker  . Packs/day: 1.00  . Years: 41.00  . Pack years: 41.00  . Types: Cigarettes  . Last attempt to quit: 09/19/2018  . Years since quitting: 0.2  Smokeless Tobacco Never Used    Goals Met:  Independence with exercise equipment Exercise tolerated well No report of cardiac concerns or symptoms Strength training completed today  Goals Unmet:  Not Applicable  Comments: Pt able to follow exercise prescription today without complaint.  Will continue to monitor for progression.    Dr. Emily Filbert is Medical Director for [facility].

## 2018-12-11 DIAGNOSIS — Z951 Presence of aortocoronary bypass graft: Secondary | ICD-10-CM

## 2018-12-11 NOTE — Progress Notes (Signed)
Daily Session Note  Patient Details  Name: Melissa Montes MRN: [id number] Date of Birth: 06/21/1956 Referring Provider:     Cardiac Rehab from 11/20/2018 in [facility] Cardiac and Pulmonary Rehab  Referring Provider  Humphrey Rolls      Encounter Date: 12/11/2018  Check In: Session Check In - 12/11/18 1612      Check-In   Supervising physician immediately available to respond to emergencies  See telemetry face sheet for immediately available ER MD    Location  [facility]    Staff Present  Earlean Shawl, BS, ACSM CEP, Exercise Physiologist;Meredith Sherryll Burger, RN BSN;Anzleigh Slaven Tessie Fass RCP,RRT,BSRT    Medication changes reported      No    Fall or balance concerns reported     No    Warm-up and Cool-down  Performed as group-led instruction    Resistance Training Performed  Yes    VAD Patient?  No    PAD/SET Patient?  No      Pain Assessment   Currently in Pain?  No/denies          Social History   Tobacco Use  Smoking Status Former Smoker  . Packs/day: 1.00  . Years: 41.00  . Pack years: 41.00  . Types: Cigarettes  . Last attempt to quit: 09/19/2018  . Years since quitting: 0.2  Smokeless Tobacco Never Used    Goals Met:  Independence with exercise equipment Exercise tolerated well No report of cardiac concerns or symptoms Strength training completed today  Goals Unmet:  Not Applicable  Comments: Pt able to follow exercise prescription today without complaint.  Will continue to monitor for progression.    Dr. Emily Filbert is Medical Director for [place] and [facility].

## 2018-12-16 ENCOUNTER — Telehealth: Payer: Self-pay

## 2018-12-16 NOTE — Telephone Encounter (Signed)
Melissa Montes saw her PCP today - she was prescribed Celexa and prednisone for her hip pain.  Her Dr advised not exercising tomorrow.  I suggested she schedule a PT screen when she returns

## 2018-12-17 ENCOUNTER — Encounter: Payer: Self-pay | Admitting: *Deleted

## 2018-12-17 DIAGNOSIS — Z951 Presence of aortocoronary bypass graft: Secondary | ICD-10-CM

## 2018-12-17 NOTE — Progress Notes (Signed)
Cardiac Individual Treatment Plan  Patient Details  Name: Melissa Montes MRN: [id number] Date of Birth: 02-16-1956 Referring Provider:     Cardiac Rehab from 11/20/2018 in [facility] Cardiac and Pulmonary Rehab  Referring Provider  Humphrey Rolls      Initial Encounter Date:    Cardiac Rehab from 11/20/2018 in [facility] Cardiac and Pulmonary Rehab  Date  11/20/18      Visit Diagnosis: S/P CABG x 2  Patient's Home Medications on Admission:  Current Outpatient Medications:  .  acetaminophen (TYLENOL) 325 MG tablet, Take 325-650 mg by mouth every 6 (six) hours as needed (for pain.)., Disp: , Rfl:  .  aspirin EC 81 MG tablet, Take 162 mg by mouth every evening. , Disp: , Rfl:  .  atorvastatin (LIPITOR) 80 MG tablet, Take 80 mg by mouth every evening. , Disp: , Rfl:  .  baclofen (LIORESAL) 10 MG tablet, Take 10 mg by mouth daily as needed for muscle spasms., Disp: , Rfl:  .  Calcium Carbonate-Vitamin D (CALTRATE 600+D PO), Take 600 mg by mouth 2 (two) times daily. , Disp: , Rfl:  .  diazepam (VALIUM) 10 MG tablet, Take 10 mg by mouth daily as needed for anxiety., Disp: , Rfl:  .  ezetimibe (ZETIA) 10 MG tablet, Take 10 mg by mouth daily. , Disp: , Rfl:  .  gabapentin (NEURONTIN) 100 MG capsule, Take 1 capsule by mouth daily., Disp: , Rfl: 2 .  gabapentin (NEURONTIN) 100 MG capsule, Take by mouth., Disp: , Rfl:  .  metoprolol succinate (TOPROL-XL) 25 MG 24 hr tablet, Take 25 mg by mouth daily. , Disp: , Rfl:  .  Multiple Vitamin (MULTIVITAMIN) tablet, Take 1 tablet by mouth daily. Centrum Silver for Women, Disp: , Rfl:  .  nitroGLYCERIN (NITROSTAT) 0.4 MG SL tablet, Place 0.4 mg under the tongue every 5 (five) minutes x 3 doses as needed for chest pain. , Disp: , Rfl:  .  ondansetron (ZOFRAN) 4 MG tablet, Take 1 tablet by mouth 4 (four) times daily., Disp: , Rfl:  .  traZODone (DESYREL) 100 MG tablet, Take 100 mg by mouth at bedtime. , Disp: , Rfl:   Past Medical History: Past Medical History:  Diagnosis  Date  . Anxiety   . Bipolar disorder ([place])   . Depression   . Heart disease   . Hyperlipidemia   . MI (myocardial infarction) ([place])     Tobacco Use: Social History   Tobacco Use  Smoking Status Former Smoker  . Packs/day: 1.00  . Years: 41.00  . Pack years: 41.00  . Types: Cigarettes  . Last attempt to quit: 09/19/2018  . Years since quitting: 0.2  Smokeless Tobacco Never Used    Labs: Recent Review Flowsheet Data    There is no flowsheet data to display.       Exercise Target Goals: Exercise Program Goal: Individual exercise prescription set using results from initial 6 min walk test and THRR while considering  patient's activity barriers and safety.   Exercise Prescription Goal: Initial exercise prescription builds to 30-45 minutes a day of aerobic activity, 2-3 days per week.  Home exercise guidelines will be given to patient during program as part of exercise prescription that the participant will acknowledge.  Activity Barriers & Risk Stratification: Activity Barriers & Cardiac Risk Stratification - 11/20/18 1512      Activity Barriers & Cardiac Risk Stratification   Activity Barriers  Back Problems;Shortness of Breath    Cardiac Risk Stratification  High       6 Minute Walk: 6 Minute Walk    Row Name 11/20/18 1424         6 Minute Walk   Phase  Initial     Distance  1250 feet     Walk Time  6 minutes     # of Rest Breaks  0     MPH  2.4     METS  3.8     RPE  11     Perceived Dyspnea   1     VO2 Peak  13.32     Symptoms  No     Resting HR  95 bpm     Resting BP  106/64     Resting Oxygen Saturation   93 %     Exercise Oxygen Saturation  during 6 min walk  97 %     Max Ex. HR  126 bpm     Max Ex. BP  120/68     2 Minute Post BP  108/66        Oxygen Initial Assessment:   Oxygen Re-Evaluation:   Oxygen Discharge (Final Oxygen Re-Evaluation):   Initial Exercise Prescription: Initial Exercise Prescription - 11/20/18 1400      Date  of Initial Exercise RX and Referring Provider   Date  11/20/18    Referring Provider  Humphrey Rolls      Treadmill   MPH  2.4    Grade  2    Minutes  15    METs  3.5      Recumbant Bike   Level  1    RPM  60    Minutes  15    METs  3.5      NuStep   Level  2    SPM  80    Minutes  15    METs  3.5      Prescription Details   Frequency (times per week)  3    Duration  Progress to 45 minutes of aerobic exercise without signs/symptoms of physical distress      Intensity   THRR 40-80% of Max Heartrate  120-145    Ratings of Perceived Exertion  11-13    Perceived Dyspnea  0-4      Resistance Training   Training Prescription  Yes    Weight  2 lb    Reps  10-15       Perform Capillary Blood Glucose checks as needed.  Exercise Prescription Changes: Exercise Prescription Changes    Row Name 11/20/18 1400 11/27/18 0800 12/11/18 1000         Response to Exercise   Blood Pressure (Admit)  106/64  82/60 maybe typo?  102/60     Blood Pressure (Exercise)  120/68  130/68  122/62     Blood Pressure (Exit)  108/66  118/64  92/60     Heart Rate (Admit)  110 bpm  82 bpm  86 bpm     Heart Rate (Exercise)  126 bpm  118 bpm  116 bpm     Heart Rate (Exit)  100 bpm  81 bpm  84 bpm     Oxygen Saturation (Admit)  93 %  -  -     Oxygen Saturation (Exit)  97 %  -  -     Rating of Perceived Exertion (Exercise)  _0 Perceived Dyspnea (Exercise)  1  -  -  Symptoms  -  leg pain on TM pain reduced with lower incline / pt has PVD  -     Comments  -  1st day  -     Duration  -  Progress to 45 minutes of aerobic exercise without signs/symptoms of physical distress  Continue with 45 min of aerobic exercise without signs/symptoms of physical distress.     Intensity  -  THRR unchanged  THRR unchanged       Progression   Progression  -  Continue to progress workloads to maintain intensity without signs/symptoms of physical distress.  Continue to progress workloads to maintain intensity  without signs/symptoms of physical distress.     Average METs  -  2  2.62       Resistance Training   Training Prescription  -  Yes  Yes     Weight  -  2 lb  3 lb     Reps  -  10-15  10-15       Interval Training   Interval Training  -  -  No       Treadmill   MPH  -  1.4  1.5     Grade  -  2  1     Minutes  -  15  15     METs  -  2.46  2.36       Recumbant Bike   Level  -  -  1     RPM  -  -  60     Minutes  -  -  15     METs  -  -  3.5       NuStep   Level  -  1  1     SPM  -  80  80     Minutes  -  15  15     METs  -  1.6  2        Exercise Comments: Exercise Comments    Row Name 11/26/18 1616           Exercise Comments  First full day of exercise!  Patient was oriented to gym and equipment including functions, settings, policies, and procedures.  Patient's individual exercise prescription and treatment plan were reviewed.  All starting workloads were established based on the results of the 6 minute walk test done at initial orientation visit.  The plan for exercise progression was also introduced and progression will be customized based on patient's performance and goals.          Exercise Goals and Review: Exercise Goals    Row Name 11/20/18 1423             Exercise Goals   Increase Physical Activity  Yes       Intervention  Provide advice, education, support and counseling about physical activity/exercise needs.;Develop an individualized exercise prescription for aerobic and resistive training based on initial evaluation findings, risk stratification, comorbidities and participant's personal goals.       Expected Outcomes  Short Term: Attend rehab on a regular basis to increase amount of physical activity.;Long Term: Add in home exercise to make exercise part of routine and to increase amount of physical activity.;Long Term: Exercising regularly at least 3-5 days a week.       Increase Strength and Stamina  Yes       Intervention  Provide advice,  education, support and counseling about physical activity/exercise needs.;Develop  an individualized exercise prescription for aerobic and resistive training based on initial evaluation findings, risk stratification, comorbidities and participant's personal goals.       Expected Outcomes  Short Term: Increase workloads from initial exercise prescription for resistance, speed, and METs.;Short Term: Perform resistance training exercises routinely during rehab and add in resistance training at home;Long Term: Improve cardiorespiratory fitness, muscular endurance and strength as measured by increased METs and functional capacity (6MWT)       Able to understand and use rate of perceived exertion (RPE) scale  Yes       Intervention  Provide education and explanation on how to use RPE scale       Expected Outcomes  Short Term: Able to use RPE daily in rehab to express subjective intensity level;Long Term:  Able to use RPE to guide intensity level when exercising independently       Able to understand and use Dyspnea scale  Yes       Intervention  Provide education and explanation on how to use Dyspnea scale       Expected Outcomes  Short Term: Able to use Dyspnea scale daily in rehab to express subjective sense of shortness of breath during exertion;Long Term: Able to use Dyspnea scale to guide intensity level when exercising independently       Knowledge and understanding of Target Heart Rate Range (THRR)  Yes       Intervention  Provide education and explanation of THRR including how the numbers were predicted and where they are located for reference       Expected Outcomes  Short Term: Able to state/look up THRR;Short Term: Able to use daily as guideline for intensity in rehab;Long Term: Able to use THRR to govern intensity when exercising independently       Able to check pulse independently  Yes       Intervention  Provide education and demonstration on how to check pulse in carotid and radial  arteries.;Review the importance of being able to check your own pulse for safety during independent exercise       Expected Outcomes  Short Term: Able to explain why pulse checking is important during independent exercise;Long Term: Able to check pulse independently and accurately       Understanding of Exercise Prescription  Yes       Intervention  Provide education, explanation, and written materials on patient's individual exercise prescription       Expected Outcomes  Short Term: Able to explain program exercise prescription;Long Term: Able to explain home exercise prescription to exercise independently          Exercise Goals Re-Evaluation : Exercise Goals Re-Evaluation    Row Name 11/26/18 1616 12/11/18 1038           Exercise Goal Re-Evaluation   Exercise Goals Review  -  Increase Physical Activity;Increase Strength and Stamina;Able to understand and use rate of perceived exertion (RPE) scale;Knowledge and understanding of Target Heart Rate Range (THRR);Able to check pulse independently;Understanding of Exercise Prescription      Comments  Reviewed RPE scale, THR and program prescription with pt today.  Pt voiced understanding and was given a copy of goals to take home.   Vaudie states she is enjoying the program.  She has increased to 3 lb for strength work.  Staff will monitor progress.      Expected Outcomes  Short: Use RPE daily to regulate intensity. Long: Follow program prescription in [facility]  Short -  continue to attend consistently Long - increase overall MET level         Discharge Exercise Prescription (Final Exercise Prescription Changes): Exercise Prescription Changes - 12/11/18 1000      Response to Exercise   Blood Pressure (Admit)  102/60    Blood Pressure (Exercise)  122/62    Blood Pressure (Exit)  92/60    Heart Rate (Admit)  86 bpm    Heart Rate (Exercise)  116 bpm    Heart Rate (Exit)  84 bpm    Rating of Perceived Exertion (Exercise)  13    Duration  Continue  with 45 min of aerobic exercise without signs/symptoms of physical distress.    Intensity  THRR unchanged      Progression   Progression  Continue to progress workloads to maintain intensity without signs/symptoms of physical distress.    Average METs  2.62      Resistance Training   Training Prescription  Yes    Weight  3 lb    Reps  10-15      Interval Training   Interval Training  No      Treadmill   MPH  1.5    Grade  1    Minutes  15    METs  2.36      Recumbant Bike   Level  1    RPM  60    Minutes  15    METs  3.5      NuStep   Level  1    SPM  80    Minutes  15    METs  2       Nutrition:  Target Goals: Understanding of nutrition guidelines, daily intake of sodium <1571m, cholesterol <2013m calories 30% from fat and 7% or less from saturated fats, daily to have 5 or more servings of fruits and vegetables.  Biometrics: Pre Biometrics - 11/20/18 1423      Pre Biometrics   Height  _0  (1.6 m)    Weight  101 lb 14.4 oz (46.2 kg)    Waist Circumference  25.5 inches    Hip Circumference  34.5 inches    Waist to Hip Ratio  0.74 %    BMI (Calculated)  18.06    Single Leg Stand  4.01 seconds        Nutrition Therapy Plan and Nutrition Goals: Nutrition Therapy & Goals - 12/01/18 1647      Nutrition Therapy   Diet  TLC    Drug/Food Interactions  Statins/Certain Fruits    Protein (specify units)  6-7oz    Fiber  20 grams    Whole Grain Foods  3 servings   does not always choose whole grains   Saturated Fats  12 max. grams    Fruits and Vegetables  4 servings/day   8 ideal. she does not eat vegetables and eats fruits infrequently   Sodium  1500 grams      Personal Nutrition Goals   Nutrition Goal  Increase daily kcal intake by ~250kcal/day. Great job for starting to drink Boost high protein drinks! Other ways to add calories include making your homemade milkshakes, drinking milk, eating sources of fats like peanut butter, adding in an extra small  meal or snack each day (eats 1 main meal/day currently)    Personal Goal #2  Consider drinking water more often, starting with one 8oz glass/day    Personal Goal #3  Add fruits into your diet more  regularly. If choosing canned, look for varieties in their own 100% juice or light syrup    Comments  She is regaining wt she lost while hospitalized with goal wt of 115#. Lowest wt post CABG 97#. She purchased Boost high protein drinks two days ago in order to start adding in more calories/ protein daily. She lives on limited income and is alloted $60/month for groceries. Her diet consists mainly of bread, potatoes, pasta, chicken, rice, ham, beans occasionally, and peanut butter. She reports to also be a picky eater and does not care for vegetables. She only likes certain kinds of fruit. Snacks: muffins, rolls, fritos occasionally. Eats one meal/day around 2pm and spends the rest of the day "snacking." Beverages: several glasses of whole milk per day, coffee, Dr. Malachi Bonds, homemade milkshakes. Does not typically drink water. She does not eat eggs or seafood.      Intervention Plan   Intervention  Prescribe, educate and counsel regarding individualized specific dietary modifications aiming towards targeted core components such as weight, hypertension, lipid management, diabetes, heart failure and other comorbidities.    Expected Outcomes  Short Term Goal: Understand basic principles of dietary content, such as calories, fat, sodium, cholesterol and nutrients.;Short Term Goal: A plan has been developed with personal nutrition goals set during dietitian appointment.;Long Term Goal: Adherence to prescribed nutrition plan.       Nutrition Assessments: Nutrition Assessments - 11/20/18 1511      MEDFICTS Scores   Pre Score  64       Nutrition Goals Re-Evaluation: Nutrition Goals Re-Evaluation    Row Name 12/01/18 1546 12/01/18 1658           Goals   Nutrition Goal  Eat healthier including a vegetable  with each meal  Increase daily kcal intake by ~250kcal/day. Great job for starting to drink Boost high protein drinks! Other ways to add calories include making your homemade milkshakes, drinking milk, eating sources of fats like peanut butter, adding in an extra small meal or snack each day (eats 1 main meal/day currently)      Comment  -  She desires wt gain after losing wt in the hospital. BMI: 18. She would like to gain 10-15# from CBW to IBW 115#      Expected Outcome  Better HEart healthy nutrition that she can afford  Consistently eat 250kcal extra daily either via a nutritional shake like Boost or extra whole food sources        Personal Goal #2 Re-Evaluation   Personal Goal #2  -  Consider drinking water more often, starting with one 8oz glass per day        Personal Goal #3 Re-Evaluation   Personal Goal #3  -  Add fruits into your diet more regularly. If choosing canned, look for varieties in their own 100% juice or light syrup         Nutrition Goals Discharge (Final Nutrition Goals Re-Evaluation): Nutrition Goals Re-Evaluation - 12/01/18 1658      Goals   Nutrition Goal  Increase daily kcal intake by ~250kcal/day. Great job for starting to drink Boost high protein drinks! Other ways to add calories include making your homemade milkshakes, drinking milk, eating sources of fats like peanut butter, adding in an extra small meal or snack each day (eats 1 main meal/day currently)    Comment  She desires wt gain after losing wt in the hospital. BMI: 18. She would like to gain 10-15# from CBW to IBW 115#  Expected Outcome  Consistently eat 250kcal extra daily either via a nutritional shake like Boost or extra whole food sources      Personal Goal #2 Re-Evaluation   Personal Goal #2  Consider drinking water more often, starting with one 8oz glass per day      Personal Goal #3 Re-Evaluation   Personal Goal #3  Add fruits into your diet more regularly. If choosing canned, look for  varieties in their own 100% juice or light syrup       Psychosocial: Target Goals: Acknowledge presence or absence of significant depression and/or stress, maximize coping skills, provide positive support system. Participant is able to verbalize types and ability to use techniques and skills needed for reducing stress and depression.   Initial Review & Psychosocial Screening: Initial Psych Review & Screening - 11/20/18 1507      Initial Review   Current issues with  Current Depression;History of Depression;Current Anxiety/Panic;Current Stress Concerns    Source of Stress Concerns  Financial;Chronic Illness    Comments  Desarae has been seeing a therapist for a while but is looking forward to learning more about mental health in our program. She lives alone, on disability. She reports having a support system, but a lot of them blame her smoking on why she needed surgery. 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 She states she was very anxious about coming to orientation but hopes to be able to attend class regularly now that she has met the staff.       Family Dynamics   Good Support System?  Yes   friends, family     Barriers   Psychosocial barriers to participate in program  There are no identifiable barriers or psychosocial needs.;The patient should benefit from training in stress management and relaxation.      Screening Interventions   Interventions  Encouraged to exercise;Program counselor consult;Provide feedback about the scores to participant;To provide support and resources with identified psychosocial needs    Expected Outcomes  Short Term goal: Utilizing psychosocial counselor, staff and physician to assist with identification of specific Stressors or current issues interfering with healing process. Setting desired goal for each stressor or current issue identified.;Long Term Goal: Stressors or current issues are controlled or  eliminated.;Short Term goal: Identification and review with participant of any Quality of Life or Depression concerns found by scoring the questionnaire.;Long Term goal: The participant improves quality of Life and PHQ9 Scores as seen by post scores and/or verbalization of changes       Quality of Life Scores:  Quality of Life - 11/20/18 1219      Quality of Life   Select  Quality of Life      Quality of Life Scores   Health/Function Pre  13 %    Socioeconomic Pre  22.67 %    Psych/Spiritual Pre  12.79 %    Family Pre  14.33 %    GLOBAL Pre  15.09 %      Scores of 19 and below usually indicate a poorer quality of life in these areas.  A difference of  2-3 points is a clinically meaningful difference.  A difference of 2-3 points in the total score of the Quality of Life Index has been associated with significant improvement in overall quality of life, self-image, physical symptoms, and general health in studies assessing change in quality of life.  PHQ-9: Recent Review Flowsheet Data  Depression screen PHQ 2/9 11/20/2018   Decreased Interest 1   Down, Depressed, Hopeless 3   PHQ - 2 Score 4   Altered sleeping 1   Tired, decreased energy 3   Change in appetite 2   Feeling bad or failure about yourself  2   Trouble concentrating 1   Moving slowly or fidgety/restless 0   Suicidal thoughts 0   PHQ-9 Score 13   Difficult doing work/chores Somewhat difficult     Interpretation of Total Score  Total Score Depression Severity:  1-4 = Minimal depression, 5-9 = Mild depression, 10-14 = Moderate depression, 15-19 = Moderately severe depression, 20-27 = Severe depression   Psychosocial Evaluation and Intervention: Psychosocial Evaluation - 12/08/18 1727      Psychosocial Evaluation & Interventions   Interventions  Stress management education;Encouraged to exercise with the program and follow exercise prescription    Comments  Counselor met with Ms. Robert Bellow Clarene [place]) today for initial  psychosocial evaluation.  She is a 64 year old who had a CABGx2 on 07/16/18.  She has a limited support system with a friend; her sister in [place] and a hiece.  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  Jazari sleeps well with the use of medications nightly and she has a good appetite but limited finances to afford good foods.  She has a significant history of depression and anxiety with a bipolar diagnosis 7-8 years ago resulting in her being on disability.  She has just begun a new medication several weeks ago for her mood and can't tell if it is helpful yet or not.  Saarah has multiple stressors with her finances; her health conditions and her limited support system.  She has goals to increase her stamina and strength.   Counselor was able to obtain [place] a gas card that can be renewed every 3rd visit she attends this program to help free up some of her finances for food, etc.   She will be followed by staff and counselor.     Expected Outcomes  Short:  Jasleen will come consistently to this program to increase her stamina and strength and to cope with the stress in her life better.  Long:  Eulah will develop a positive self-care program for her health and mental health.     Continue Psychosocial Services   Follow up required by counselor       Psychosocial Re-Evaluation: Psychosocial Re-Evaluation    [place] Name 12/01/18 1542             Psychosocial Re-Evaluation   Current issues with  Current Depression;Current Stress Concerns       Comments  Lakie said she started on a new antidepressant last week. Carianne was given printed info about the Women [facility] Support group and may try to attend tomorrow. Rayanne said she has been going to a Clinical cytogeneticist but not sure they have been helping her lately.. I suggested that she talk to our counselor for any suggestions about other counselors. Eleonore says has lives alone and has anxiety about  even when her heart rate goes up.       Expected Outcomes  Stress techniques like pursed lip breathing. Her new antidepressant to help her more.        Comments  Daffney said she lives on $850/month disability and gets only $60 food stamps. I suggested the [facility] which serves $4 lunches.  Initial Review   Source of Stress Concerns  Poor Coping Skills;Financial          Psychosocial Discharge (Final Psychosocial Re-Evaluation): Psychosocial Re-Evaluation - 12/01/18 1542      Psychosocial Re-Evaluation   Current issues with  Current Depression;Current Stress Concerns    Comments  Dustin said she started on a new antidepressant last week. Takhia was given printed info about the Women [facility] Support group and may try to attend tomorrow. Akasia said she has been going to a Clinical cytogeneticist but not sure they have been helping her lately.. I suggested that she talk to our counselor for any suggestions about other counselors. Calena says has lives alone and has anxiety about even when her heart rate goes up.    Expected Outcomes  Stress techniques like pursed lip breathing. Her new antidepressant to help her more.     Comments  Cuca said she lives on $850/month disability and gets only $60 food stamps. I suggested the [facility] which serves $4 lunches.       Initial Review   Source of Stress Concerns  Poor Coping Skills;Financial       Vocational Rehabilitation: Provide vocational rehab assistance to qualifying candidates.   Vocational Rehab Evaluation & Intervention: Vocational Rehab - 11/20/18 1507      Initial Vocational Rehab Evaluation & Intervention   Assessment shows need for Vocational Rehabilitation  No       Education: Education Goals: Education classes will be provided on a variety of topics geared toward better understanding of heart health and risk factor modification. Participant will state understanding/return demonstration of topics presented as  noted by education test scores.  Learning Barriers/Preferences: Learning Barriers/Preferences - 11/20/18 1506      Learning Barriers/Preferences   Learning Barriers  None    Learning Preferences  Individual Instruction       Education Topics:  AED/CPR: - Group verbal and written instruction with the use of models to demonstrate the basic use of the AED with the basic ABC's of resuscitation.   General Nutrition Guidelines/Fats and Fiber: -Group instruction provided by verbal, written material, models and posters to present the general guidelines for heart healthy nutrition. Gives an explanation and review of dietary fats and fiber.   Cardiac Rehab from 12/10/2018 in [facility] Cardiac and Pulmonary Rehab  Date  12/01/18  Educator  Lattie Haw RD  Instruction Review Code  1- Verbalizes Understanding      Controlling Sodium/Reading Food Labels: -Group verbal and written material supporting the discussion of sodium use in heart healthy nutrition. Review and explanation with models, verbal and written materials for utilization of the food label.   Exercise Physiology & General Exercise Guidelines: - Group verbal and written instruction with models to review the exercise physiology of the cardiovascular system and associated critical values. Provides general exercise guidelines with specific guidelines to those with heart or lung disease.    Cardiac Rehab from 12/10/2018 in [facility] Cardiac and Pulmonary Rehab  Date  12/08/18  Educator  [facility]  Instruction Review Code  1- Verbalizes Understanding      Aerobic Exercise & Resistance Training: - Gives group verbal and written instruction on the various components of exercise. Focuses on aerobic and resistive training programs and the benefits of this training and how to safely progress through these programs..   Cardiac Rehab from 12/10/2018 in [facility] Cardiac and Pulmonary Rehab  Date  12/10/18  Educator  AS  Instruction Review Code  1- [employer]  Understanding      Flexibility, Balance, Mind/Body Relaxation: Provides group verbal/written instruction on the benefits of flexibility and balance training, including mind/body exercise modes such as yoga, pilates and tai chi.  Demonstration and skill practice provided.   Stress and Anxiety: - Provides group verbal and written instruction about the health risks of elevated stress and causes of high stress.  Discuss the correlation between heart/lung disease and anxiety and treatment options. Review healthy ways to manage with stress and anxiety.   Depression: - Provides group verbal and written instruction on the correlation between heart/lung disease and depressed mood, treatment options, and the stigmas associated with seeking treatment.   Cardiac Rehab from 12/10/2018 in [facility] Cardiac and Pulmonary Rehab  Date  11/26/18  Educator  [facility]  Instruction Review Code  1- Verbalizes Understanding      Anatomy & Physiology of the Heart: - Group verbal and written instruction and models provide basic cardiac anatomy and physiology, with the coronary electrical and arterial systems. Review of Valvular disease and Heart Failure   Cardiac Procedures: - Group verbal and written instruction to review commonly prescribed medications for heart disease. Reviews the medication, class of the drug, and side effects. Includes the steps to properly store meds and maintain the prescription regimen. (beta blockers and nitrates)   Cardiac Medications I: - Group verbal and written instruction to review commonly prescribed medications for heart disease. Reviews the medication, class of the drug, and side effects. Includes the steps to properly store meds and maintain the prescription regimen.   Cardiac Medications II: -Group verbal and written instruction to review commonly prescribed medications for heart disease. Reviews the medication, class of the drug, and side effects. (all other drug classes)    Go  Sex-Intimacy & Heart Disease, Get SMART - Goal Setting: - Group verbal and written instruction through game format to discuss heart disease and the return to sexual intimacy. Provides group verbal and written material to discuss and apply goal setting through the application of the S.M.A.R.T. Method.   Other Matters of the Heart: - Provides group verbal, written materials and models to describe Stable Angina and Peripheral Artery. Includes description of the disease process and treatment options available to the cardiac patient.   Exercise & Equipment Safety: - Individual verbal instruction and demonstration of equipment use and safety with use of the equipment.   Cardiac Rehab from 12/10/2018 in [facility] Cardiac and Pulmonary Rehab  Date  11/20/18  Educator  [facility]  Instruction Review Code  1- Verbalizes Understanding      Infection Prevention: - Provides verbal and written material to individual with discussion of infection control including proper hand washing and proper equipment cleaning during exercise session.   Cardiac Rehab from 12/10/2018 in [facility] Cardiac and Pulmonary Rehab  Date  11/20/18  Educator  [facility]  Instruction Review Code  1- Verbalizes Understanding      Falls Prevention: - Provides verbal and written material to individual with discussion of falls prevention and safety.   Cardiac Rehab from 12/10/2018 in [facility] Cardiac and Pulmonary Rehab  Date  11/20/18  Educator  [name]  Instruction Review Code  1- Verbalizes Understanding      Diabetes: - Individual verbal and written instruction to review signs/symptoms of diabetes, desired ranges of glucose level fasting, after meals and with exercise. Acknowledge that pre and post exercise glucose checks will be done for 3 sessions at entry of program.   Know Your Numbers and Risk Factors: -Group verbal and  written instruction about important numbers in your health.  Discussion of what are risk factors and how they play a role in the  disease process.  Review of Cholesterol, Blood Pressure, Diabetes, and BMI and the role they play in your overall health.   Sleep Hygiene: -Provides group verbal and written instruction about how sleep can affect your health.  Define sleep hygiene, discuss sleep cycles and impact of sleep habits. Review good sleep hygiene tips.    Other: -Provides group and verbal instruction on various topics (see comments)   Knowledge Questionnaire Score: Knowledge Questionnaire Score - 11/20/18 1221      Knowledge Questionnaire Score   Pre Score  25/26   correct answers reviewed with Kinslie      Core Components/Risk Factors/Patient Goals at Admission: Personal Goals and Risk Factors at Admission - 11/20/18 1505      Core Components/Risk Factors/Patient Goals on Admission    Weight Management  Yes;Weight Gain    Intervention  Weight Management: Develop a combined nutrition and exercise program designed to reach desired caloric intake, while maintaining appropriate intake of nutrient and fiber, sodium and fats, and appropriate energy expenditure required for the weight goal.;Weight Management: Provide education and appropriate resources to help participant work on and attain dietary goals.    Admit Weight  101 lb 14.4 oz (46.2 kg)    Goal Weight: Short Term  104 lb (47.2 kg)    Goal Weight: Long Term  115 lb (52.2 kg)    Expected Outcomes  Short Term: Continue to assess and modify interventions until short term weight is achieved;Long Term: Adherence to nutrition and physical activity/exercise program aimed toward attainment of established weight goal;Understanding recommendations for meals to include 15-35% energy as protein, 25-35% energy from fat, 35-60% energy from carbohydrates, less than 282m of dietary cholesterol, 20-35 gm of total fiber daily;Understanding of distribution of calorie intake throughout the day with the consumption of 4-5 meals/snacks;Weight Gain: Understanding of general  recommendations for a high calorie, high protein meal plan that promotes weight gain by distributing calorie intake throughout the day with the consumption for 4-5 meals, snacks, and/or supplements    Tobacco Cessation  Yes    Intervention  Assist the participant in steps to quit. Provide individualized education and counseling about committing to Tobacco Cessation, relapse prevention, and pharmacological support that can be provided by physician.;OAdvice worker assist with locating and accessing local/national Quit Smoking programs, and support quit date choice.    Expected Outcomes  Short Term: Will demonstrate readiness to quit, by selecting a quit date.;Short Term: Will quit all tobacco product use, adhering to prevention of relapse plan.;Long Term: Complete abstinence from all tobacco products for at least 12 months from quit date.    Lipids  Yes    Intervention  Provide education and support for participant on nutrition & aerobic/resistive exercise along with prescribed medications to achieve LDL <741m HDL >4037m   Expected Outcomes  Short Term: Participant states understanding of desired cholesterol values and is compliant with medications prescribed. Participant is following exercise prescription and nutrition guidelines.;Long Term: Cholesterol controlled with medications as prescribed, with individualized exercise RX and with personalized nutrition plan. Value goals: LDL < 61m39mDL > 40 mg.       Core Components/Risk Factors/Patient Goals Review:  Goals and Risk Factor Review    Row Name 12/01/18 1546 12/01/18 1747           Core Components/Risk Factors/Patient Goals Review   Personal Goals  Review  Weight Management/Obesity;Tobacco Cessation;Lipids  Tobacco Cessation      Review  Shilo said she is trying to gain weight and knows she wants to eat healthier. Kyah went to the heart doctor today and she reports he was happy with her initial progress.   Ilyssa reports that she  quit smoking the day of her heart attack but "still struggles but I know I need to stay quit and not smoke".      Expected Outcomes  Heart healthy living including gaining weight so her BMI is more and she has more reserve.  Cont to not smoke cigarettes.         Core Components/Risk Factors/Patient Goals at Discharge (Final Review):  Goals and Risk Factor Review - 12/01/18 1747      Core Components/Risk Factors/Patient Goals Review   Personal Goals Review  Tobacco Cessation    Review  Athira reports that she quit smoking the day of her heart attack but "still struggles but I know I need to stay quit and not smoke".    Expected Outcomes  Cont to not smoke cigarettes.       ITP Comments: ITP Comments    Row Name 11/20/18 1458 12/17/18 1420         ITP Comments  Med Review completed. Initial ITP created. Diagnosis can be found in [facility] 8/28  30 day review completed. ITP sent to Dr. Emily Filbert, Medical Director of Cardiac Rehab. Continue with ITP unless changes are made by physician.         Comments: 30 day review

## 2018-12-18 DIAGNOSIS — Z951 Presence of aortocoronary bypass graft: Secondary | ICD-10-CM

## 2018-12-18 NOTE — Progress Notes (Signed)
Daily Session Note  Patient Details  Name: Melissa Montes MRN: [id number] Date of Birth: Mar 24, 1956 Referring Provider:     Cardiac Rehab from 11/20/2018 in [facility] Cardiac and Pulmonary Rehab  Referring Provider  Dorthula Perfect Date: 12/18/2018  Check In:      Social History   Tobacco Use  Smoking Status Former Smoker  . Packs/day: 1.00  . Years: 41.00  . Pack years: 41.00  . Types: Cigarettes  . Last attempt to quit: 09/19/2018  . Years since quitting: 0.2  Smokeless Tobacco Never Used    Goals Met:  Independence with exercise equipment Exercise tolerated well No report of cardiac concerns or symptoms Strength training completed today  Goals Unmet:  Not Applicable  Comments: Pt able to follow exercise prescription today without complaint.  Will continue to monitor for progression.    Dr. Emily Filbert is Medical Director for [place] and [facility].

## 2018-12-22 ENCOUNTER — Encounter: Payer: Medicare HMO | Attending: Cardiovascular Disease

## 2018-12-22 DIAGNOSIS — Z951 Presence of aortocoronary bypass graft: Secondary | ICD-10-CM | POA: Diagnosis not present

## 2018-12-22 NOTE — Progress Notes (Signed)
Daily Session Note  Patient Details  Name: Natsumi Whitsitt MRN: [id number] Date of Birth: 06/23/1956 Referring Provider:     Cardiac Rehab from 11/20/2018 in [facility] Cardiac and Pulmonary Rehab  Referring Provider  Dorthula Perfect Date: 12/22/2018  Check In: Session Check In - 12/22/18 1725      Check-In   Supervising physician immediately available to respond to emergencies  See telemetry face sheet for immediately available ER MD    Location  [facility]-Cardiac & Pulmonary Rehab    Staff Present  Gerlene Burdock, RN, Moises Blood, BS, ACSM CEP, Exercise Physiologist;Peaches Vanoverbeke Oletta Darter, [place], ACSM CEP, Exercise Physiologist    Medication changes reported      No    Fall or balance concerns reported     No    Warm-up and Cool-down  Performed as group-led instruction    Resistance Training Performed  Yes    VAD Patient?  No    PAD/SET Patient?  No      Pain Assessment   Currently in Pain?  No/denies    Multiple Pain Sites  No          Social History   Tobacco Use  Smoking Status Former Smoker  . Packs/day: 1.00  . Years: 41.00  . Pack years: 41.00  . Types: Cigarettes  . Last attempt to quit: 09/19/2018  . Years since quitting: 0.2  Smokeless Tobacco Never Used    Goals Met:  Independence with exercise equipment Exercise tolerated well No report of cardiac concerns or symptoms Strength training completed today  Goals Unmet:  Not Applicable  Comments: Pt able to follow exercise prescription today without complaint.  Will continue to monitor for progression.    Dr. Emily Filbert is Medical Director for [place] and [facility].

## 2018-12-24 ENCOUNTER — Telehealth: Payer: Self-pay

## 2018-12-24 NOTE — Telephone Encounter (Signed)
I returned a message from [place] saying she has hip pain and wants to discontinue HT.  I called back and LM for her to call - recommend she get a PT screen to see if that will help.

## 2018-12-26 ENCOUNTER — Telehealth: Payer: Self-pay

## 2018-12-26 NOTE — Telephone Encounter (Signed)
Spoke with Melissa Montes today - she feels she needs to see a specialist about her hip.  She will follow up with HT staff on Monday about when she has an appointment.  We will decide from there about how to continue with HT.

## 2019-01-05 DIAGNOSIS — I10 Essential (primary) hypertension: Secondary | ICD-10-CM | POA: Diagnosis not present

## 2019-01-05 DIAGNOSIS — L309 Dermatitis, unspecified: Secondary | ICD-10-CM | POA: Diagnosis not present

## 2019-01-05 DIAGNOSIS — I2510 Atherosclerotic heart disease of native coronary artery without angina pectoris: Secondary | ICD-10-CM | POA: Diagnosis not present

## 2019-01-08 ENCOUNTER — Telehealth: Payer: Self-pay | Admitting: *Deleted

## 2019-01-08 ENCOUNTER — Encounter: Payer: Self-pay | Admitting: *Deleted

## 2019-01-08 NOTE — Telephone Encounter (Signed)
I called Melissa Montes and she said she will calll [place] back after she sees her doctor for her bilateral hip pain that is keeping her up at night. Melissa Montes said she liked Cardiac Rehab and knows it would help her so she will call us back after she sees her specialist doctor on Monday.

## 2019-01-08 NOTE — Progress Notes (Unsigned)
Cardiac Individual Treatment Plan  Patient Details  Name: Melissa Montes MRN: [id number] Date of Birth: 01/25/1956 Referring Provider:     Cardiac Rehab from 11/20/2018 in [facility] Cardiac and Pulmonary Rehab  Referring Provider  Humphrey Rolls      Initial Encounter Date:    Cardiac Rehab from 11/20/2018 in [facility] Cardiac and Pulmonary Rehab  Date  11/20/18      Visit Diagnosis: No diagnosis found.  Patient's Home Medications on Admission:  Current Outpatient Medications:  .  acetaminophen (TYLENOL) 325 MG tablet, Take 325-650 mg by mouth every 6 (six) hours as needed (for pain.)., Disp: , Rfl:  .  aspirin EC 81 MG tablet, Take 162 mg by mouth every evening. , Disp: , Rfl:  .  atorvastatin (LIPITOR) 80 MG tablet, Take 80 mg by mouth every evening. , Disp: , Rfl:  .  baclofen (LIORESAL) 10 MG tablet, Take 10 mg by mouth daily as needed for muscle spasms., Disp: , Rfl:  .  Calcium Carbonate-Vitamin D (CALTRATE 600+D PO), Take 600 mg by mouth 2 (two) times daily. , Disp: , Rfl:  .  diazepam (VALIUM) 10 MG tablet, Take 10 mg by mouth daily as needed for anxiety., Disp: , Rfl:  .  ezetimibe (ZETIA) 10 MG tablet, Take 10 mg by mouth daily. , Disp: , Rfl:  .  gabapentin (NEURONTIN) 100 MG capsule, Take 1 capsule by mouth daily., Disp: , Rfl: 2 .  gabapentin (NEURONTIN) 100 MG capsule, Take by mouth., Disp: , Rfl:  .  metoprolol succinate (TOPROL-XL) 25 MG 24 hr tablet, Take 25 mg by mouth daily. , Disp: , Rfl:  .  Multiple Vitamin (MULTIVITAMIN) tablet, Take 1 tablet by mouth daily. Centrum Silver for Women, Disp: , Rfl:  .  nitroGLYCERIN (NITROSTAT) 0.4 MG SL tablet, Place 0.4 mg under the tongue every 5 (five) minutes x 3 doses as needed for chest pain. , Disp: , Rfl:  .  ondansetron (ZOFRAN) 4 MG tablet, Take 1 tablet by mouth 4 (four) times daily., Disp: , Rfl:  .  traZODone (DESYREL) 100 MG tablet, Take 100 mg by mouth at bedtime. , Disp: , Rfl:   Past Medical History: Past Medical History:   Diagnosis Date  . Anxiety   . Bipolar disorder ([place])   . Depression   . Heart disease   . Hyperlipidemia   . MI (myocardial infarction) ([place])     Tobacco Use: Social History   Tobacco Use  Smoking Status Former Smoker  . Packs/day: 1.00  . Years: 41.00  . Pack years: 41.00  . Types: Cigarettes  . Last attempt to quit: 09/19/2018  . Years since quitting: 0.3  Smokeless Tobacco Never Used    Labs: Recent Review Flowsheet Data    There is no flowsheet data to display.       Exercise Target Goals: Exercise Program Goal: Individual exercise prescription set using results from initial 6 min walk test and THRR while considering  patient's activity barriers and safety.   Exercise Prescription Goal: Initial exercise prescription builds to 30-45 minutes a day of aerobic activity, 2-3 days per week.  Home exercise guidelines will be given to patient during program as part of exercise prescription that the participant will acknowledge.  Activity Barriers & Risk Stratification: Activity Barriers & Cardiac Risk Stratification - 11/20/18 1512      Activity Barriers & Cardiac Risk Stratification   Activity Barriers  Back Problems;Shortness of Breath    Cardiac Risk Stratification  High       6 Minute Walk: 6 Minute Walk    Row Name 11/20/18 1424         6 Minute Walk   Phase  Initial     Distance  1250 feet     Walk Time  6 minutes     # of Rest Breaks  0     MPH  2.4     METS  3.8     RPE  11     Perceived Dyspnea   1     VO2 Peak  13.32     Symptoms  No     Resting HR  95 bpm     Resting BP  106/64     Resting Oxygen Saturation   93 %     Exercise Oxygen Saturation  during 6 min walk  97 %     Max Ex. HR  126 bpm     Max Ex. BP  120/68     2 Minute Post BP  108/66        Oxygen Initial Assessment:   Oxygen Re-Evaluation:   Oxygen Discharge (Final Oxygen Re-Evaluation):   Initial Exercise Prescription: Initial Exercise Prescription - 11/20/18 1400       Date of Initial Exercise RX and Referring Provider   Date  11/20/18    Referring Provider  Humphrey Rolls      Treadmill   MPH  2.4    Grade  2    Minutes  15    METs  3.5      Recumbant Bike   Level  1    RPM  60    Minutes  15    METs  3.5      NuStep   Level  2    SPM  80    Minutes  15    METs  3.5      Prescription Details   Frequency (times per week)  3    Duration  Progress to 45 minutes of aerobic exercise without signs/symptoms of physical distress      Intensity   THRR 40-80% of Max Heartrate  120-145    Ratings of Perceived Exertion  11-13    Perceived Dyspnea  0-4      Resistance Training   Training Prescription  Yes    Weight  2 lb    Reps  10-15       Perform Capillary Blood Glucose checks as needed.  Exercise Prescription Changes: Exercise Prescription Changes    Row Name 11/20/18 1400 11/27/18 0800 12/11/18 1000 12/25/18 1100       Response to Exercise   Blood Pressure (Admit)  106/64  82/60 maybe typo?  102/60  110/70    Blood Pressure (Exercise)  120/68  130/68  122/62  122/62    Blood Pressure (Exit)  108/66  118/64  92/60  96/56    Heart Rate (Admit)  110 bpm  82 bpm  86 bpm  73 bpm    Heart Rate (Exercise)  126 bpm  118 bpm  116 bpm  112 bpm    Heart Rate (Exit)  100 bpm  81 bpm  84 bpm  97 bpm    Oxygen Saturation (Admit)  93 %  -  -  -    Oxygen Saturation (Exit)  97 %  -  -  -    Rating of Perceived Exertion (Exercise)  _0 16  Perceived Dyspnea (Exercise)  1  -  -  -    Symptoms  -  leg pain on TM pain reduced with lower incline / pt has PVD  -  -    Comments  -  1st day  -  -    Duration  -  Progress to 45 minutes of aerobic exercise without signs/symptoms of physical distress  Continue with 45 min of aerobic exercise without signs/symptoms of physical distress.  Continue with 45 min of aerobic exercise without signs/symptoms of physical distress.    Intensity  -  THRR unchanged  THRR unchanged  THRR unchanged       Progression   Progression  -  Continue to progress workloads to maintain intensity without signs/symptoms of physical distress.  Continue to progress workloads to maintain intensity without signs/symptoms of physical distress.  Continue to progress workloads to maintain intensity without signs/symptoms of physical distress.    Average METs  -  2  2.62  2.9      Resistance Training   Training Prescription  -  Yes  Yes  Yes    Weight  -  2 lb  3 lb  3 lb    Reps  -  10-15  10-15  10-15      Interval Training   Interval Training  -  -  No  No      Treadmill   MPH  -  1.4  1.5  -    Grade  -  2  1  -    Minutes  -  15  15  -    METs  -  2.46  2.36  -      Recumbant Bike   Level  -  -  1  1    RPM  -  -  60  60    Minutes  -  -  15  15    METs  -  -  3.5  3.6      NuStep   Level  -  _0 SPM  -  80  80  80    Minutes  -  _1 METs  -  1.6  2  2.2       Exercise Comments: Exercise Comments    Row Name 11/26/18 1616           Exercise Comments  First full day of exercise!  Patient was oriented to gym and equipment including functions, settings, policies, and procedures.  Patient's individual exercise prescription and treatment plan were reviewed.  All starting workloads were established based on the results of the 6 minute walk test done at initial orientation visit.  The plan for exercise progression was also introduced and progression will be customized based on patient's performance and goals.          Exercise Goals and Review: Exercise Goals    Row Name 11/20/18 1423             Exercise Goals   Increase Physical Activity  Yes       Intervention  Provide advice, education, support and counseling about physical activity/exercise needs.;Develop an individualized exercise prescription for aerobic and resistive training based on initial evaluation findings, risk stratification, comorbidities and participant's personal goals.       Expected Outcomes   Short Term: Attend rehab on a regular basis to increase amount  of physical activity.;Long Term: Add in home exercise to make exercise part of routine and to increase amount of physical activity.;Long Term: Exercising regularly at least 3-5 days a week.       Increase Strength and Stamina  Yes       Intervention  Provide advice, education, support and counseling about physical activity/exercise needs.;Develop an individualized exercise prescription for aerobic and resistive training based on initial evaluation findings, risk stratification, comorbidities and participant's personal goals.       Expected Outcomes  Short Term: Increase workloads from initial exercise prescription for resistance, speed, and METs.;Short Term: Perform resistance training exercises routinely during rehab and add in resistance training at home;Long Term: Improve cardiorespiratory fitness, muscular endurance and strength as measured by increased METs and functional capacity (6MWT)       Able to understand and use rate of perceived exertion (RPE) scale  Yes       Intervention  Provide education and explanation on how to use RPE scale       Expected Outcomes  Short Term: Able to use RPE daily in rehab to express subjective intensity level;Long Term:  Able to use RPE to guide intensity level when exercising independently       Able to understand and use Dyspnea scale  Yes       Intervention  Provide education and explanation on how to use Dyspnea scale       Expected Outcomes  Short Term: Able to use Dyspnea scale daily in rehab to express subjective sense of shortness of breath during exertion;Long Term: Able to use Dyspnea scale to guide intensity level when exercising independently       Knowledge and understanding of Target Heart Rate Range (THRR)  Yes       Intervention  Provide education and explanation of THRR including how the numbers were predicted and where they are located for reference       Expected Outcomes  Short Term:  Able to state/look up THRR;Short Term: Able to use daily as guideline for intensity in rehab;Long Term: Able to use THRR to govern intensity when exercising independently       Able to check pulse independently  Yes       Intervention  Provide education and demonstration on how to check pulse in carotid and radial arteries.;Review the importance of being able to check your own pulse for safety during independent exercise       Expected Outcomes  Short Term: Able to explain why pulse checking is important during independent exercise;Long Term: Able to check pulse independently and accurately       Understanding of Exercise Prescription  Yes       Intervention  Provide education, explanation, and written materials on patient's individual exercise prescription       Expected Outcomes  Short Term: Able to explain program exercise prescription;Long Term: Able to explain home exercise prescription to exercise independently          Exercise Goals Re-Evaluation : Exercise Goals Re-Evaluation    Row Name 11/26/18 1616 12/11/18 1038 12/25/18 1121         Exercise Goal Re-Evaluation   Exercise Goals Review  -  Increase Physical Activity;Increase Strength and Stamina;Able to understand and use rate of perceived exertion (RPE) scale;Knowledge and understanding of Target Heart Rate Range (THRR);Able to check pulse independently;Understanding of Exercise Prescription  Increase Physical Activity;Increase Strength and Stamina;Able to understand and use rate of perceived exertion (RPE) scale;Knowledge and understanding  of Target Heart Rate Range (THRR);Able to check pulse independently     Comments  Reviewed RPE scale, THR and program prescription with pt today.  Pt voiced understanding and was given a copy of goals to take home.   Dacie states she is enjoying the program.  She has increased to 3 lb for strength work.  Staff will monitor progress.  Kisha has been having hip pain.  Staff recommended PT screen.  Will  follow up with [place].     Expected Outcomes  Short: Use RPE daily to regulate intensity. Long: Follow program prescription in THR  Short - continue to attend consistently Long - increase overall MET level  Short - get PT screen Long - exercise without hip pain        Discharge Exercise Prescription (Final Exercise Prescription Changes): Exercise Prescription Changes - 12/25/18 1100      Response to Exercise   Blood Pressure (Admit)  110/70    Blood Pressure (Exercise)  122/62    Blood Pressure (Exit)  96/56    Heart Rate (Admit)  73 bpm    Heart Rate (Exercise)  112 bpm    Heart Rate (Exit)  97 bpm    Rating of Perceived Exertion (Exercise)  16    Duration  Continue with 45 min of aerobic exercise without signs/symptoms of physical distress.    Intensity  THRR unchanged      Progression   Progression  Continue to progress workloads to maintain intensity without signs/symptoms of physical distress.    Average METs  2.9      Resistance Training   Training Prescription  Yes    Weight  3 lb    Reps  10-15      Interval Training   Interval Training  No      Recumbant Bike   Level  1    RPM  60    Minutes  15    METs  3.6      NuStep   Level  1    SPM  80    Minutes  15    METs  2.2       Nutrition:  Target Goals: Understanding of nutrition guidelines, daily intake of sodium <1573m, cholesterol <2055m calories 30% from fat and 7% or less from saturated fats, daily to have 5 or more servings of fruits and vegetables.  Biometrics: Pre Biometrics - 11/20/18 1423      Pre Biometrics   Height  _0  (1.6 m)    Weight  101 lb 14.4 oz (46.2 kg)    Waist Circumference  25.5 inches    Hip Circumference  34.5 inches    Waist to Hip Ratio  0.74 %    BMI (Calculated)  18.06    Single Leg Stand  4.01 seconds        Nutrition Therapy Plan and Nutrition Goals: Nutrition Therapy & Goals - 12/01/18 1647      Nutrition Therapy   Diet  TLC    Drug/Food Interactions   Statins/Certain Fruits    Protein (specify units)  6-7oz    Fiber  20 grams    Whole Grain Foods  3 servings   does not always choose whole grains   Saturated Fats  12 max. grams    Fruits and Vegetables  4 servings/day   8 ideal. she does not eat vegetables and eats fruits infrequently   Sodium  1500 grams      Personal  Nutrition Goals   Nutrition Goal  Increase daily kcal intake by ~250kcal/day. Great job for starting to drink Boost high protein drinks! Other ways to add calories include making your homemade milkshakes, drinking milk, eating sources of fats like peanut butter, adding in an extra small meal or snack each day (eats 1 main meal/day currently)    Personal Goal #2  Consider drinking water more often, starting with one 8oz glass/day    Personal Goal #3  Add fruits into your diet more regularly. If choosing canned, look for varieties in their own 100% juice or light syrup    Comments  She is regaining wt she lost while hospitalized with goal wt of 115#. Lowest wt post CABG 97#. She purchased Boost high protein drinks two days ago in order to start adding in more calories/ protein daily. She lives on limited income and is alloted $60/month for groceries. Her diet consists mainly of bread, potatoes, pasta, chicken, rice, ham, beans occasionally, and peanut butter. She reports to also be a picky eater and does not care for vegetables. She only likes certain kinds of fruit. Snacks: muffins, rolls, fritos occasionally. Eats one meal/day around 2pm and spends the rest of the day "snacking." Beverages: several glasses of whole milk per day, coffee, Dr. Malachi Bonds, homemade milkshakes. Does not typically drink water. She does not eat eggs or seafood.      Intervention Plan   Intervention  Prescribe, educate and counsel regarding individualized specific dietary modifications aiming towards targeted core components such as weight, hypertension, lipid management, diabetes, heart failure and other  comorbidities.    Expected Outcomes  Short Term Goal: Understand basic principles of dietary content, such as calories, fat, sodium, cholesterol and nutrients.;Short Term Goal: A plan has been developed with personal nutrition goals set during dietitian appointment.;Long Term Goal: Adherence to prescribed nutrition plan.       Nutrition Assessments: Nutrition Assessments - 11/20/18 1511      MEDFICTS Scores   Pre Score  64       Nutrition Goals Re-Evaluation: Nutrition Goals Re-Evaluation    Row Name 12/01/18 1546 12/01/18 1658           Goals   Nutrition Goal  Eat healthier including a vegetable with each meal  Increase daily kcal intake by ~250kcal/day. Great job for starting to drink Boost high protein drinks! Other ways to add calories include making your homemade milkshakes, drinking milk, eating sources of fats like peanut butter, adding in an extra small meal or snack each day (eats 1 main meal/day currently)      Comment  -  She desires wt gain after losing wt in the hospital. BMI: 18. She would like to gain 10-15# from CBW to IBW 115#      Expected Outcome  Better HEart healthy nutrition that she can afford  Consistently eat 250kcal extra daily either via a nutritional shake like Boost or extra whole food sources        Personal Goal #2 Re-Evaluation   Personal Goal #2  -  Consider drinking water more often, starting with one 8oz glass per day        Personal Goal #3 Re-Evaluation   Personal Goal #3  -  Add fruits into your diet more regularly. If choosing canned, look for varieties in their own 100% juice or light syrup         Nutrition Goals Discharge (Final Nutrition Goals Re-Evaluation): Nutrition Goals Re-Evaluation - 12/01/18 1658  Goals   Nutrition Goal  Increase daily kcal intake by ~250kcal/day. Great job for starting to drink Boost high protein drinks! Other ways to add calories include making your homemade milkshakes, drinking milk, eating sources of fats  like peanut butter, adding in an extra small meal or snack each day (eats 1 main meal/day currently)    Comment  She desires wt gain after losing wt in the hospital. BMI: 18. She would like to gain 10-15# from CBW to IBW 115#    Expected Outcome  Consistently eat 250kcal extra daily either via a nutritional shake like Boost or extra whole food sources      Personal Goal #2 Re-Evaluation   Personal Goal #2  Consider drinking water more often, starting with one 8oz glass per day      Personal Goal #3 Re-Evaluation   Personal Goal #3  Add fruits into your diet more regularly. If choosing canned, look for varieties in their own 100% juice or light syrup       Psychosocial: Target Goals: Acknowledge presence or absence of significant depression and/or stress, maximize coping skills, provide positive support system. Participant is able to verbalize types and ability to use techniques and skills needed for reducing stress and depression.   Initial Review & Psychosocial Screening: Initial Psych Review & Screening - 11/20/18 1507      Initial Review   Current issues with  Current Depression;History of Depression;Current Anxiety/Panic;Current Stress Concerns    Source of Stress Concerns  Financial;Chronic Illness    Comments  Reginald has been seeing a therapist for a while but is looking forward to learning more about mental health in our program. She lives alone, on disability. She reports having a support system, but a lot of them blame her smoking on why she needed surgery. 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 She states she was very anxious about coming to orientation but hopes to be able to attend class regularly now that she has met the staff.       Family Dynamics   Good Support System?  Yes   friends, family     Barriers   Psychosocial barriers to participate in program  There are no identifiable barriers or psychosocial needs.;The  patient should benefit from training in stress management and relaxation.      Screening Interventions   Interventions  Encouraged to exercise;Program counselor consult;Provide feedback about the scores to participant;To provide support and resources with identified psychosocial needs    Expected Outcomes  Short Term goal: Utilizing psychosocial counselor, staff and physician to assist with identification of specific Stressors or current issues interfering with healing process. Setting desired goal for each stressor or current issue identified.;Long Term Goal: Stressors or current issues are controlled or eliminated.;Short Term goal: Identification and review with participant of any Quality of Life or Depression concerns found by scoring the questionnaire.;Long Term goal: The participant improves quality of Life and PHQ9 Scores as seen by post scores and/or verbalization of changes       Quality of Life Scores:  Quality of Life - 11/20/18 1219      Quality of Life   Select  Quality of Life      Quality of Life Scores   Health/Function Pre  13 %    Socioeconomic Pre  22.67 %    Psych/Spiritual Pre  12.79 %    Family Pre  14.33 %  GLOBAL Pre  15.09 %      Scores of 19 and below usually indicate a poorer quality of life in these areas.  A difference of  2-3 points is a clinically meaningful difference.  A difference of 2-3 points in the total score of the Quality of Life Index has been associated with significant improvement in overall quality of life, self-image, physical symptoms, and general health in studies assessing change in quality of life.  PHQ-9: Recent Review Flowsheet Data    Depression screen [facility] 2/9 11/20/2018   Decreased Interest 1   Down, Depressed, Hopeless 3   PHQ - 2 Score 4   Altered sleeping 1   Tired, decreased energy 3   Change in appetite 2   Feeling bad or failure about yourself  2   Trouble concentrating 1   Moving slowly or fidgety/restless 0   Suicidal  thoughts 0   PHQ-9 Score 13   Difficult doing work/chores Somewhat difficult     Interpretation of Total Score  Total Score Depression Severity:  1-4 = Minimal depression, 5-9 = Mild depression, 10-14 = Moderate depression, 15-19 = Moderately severe depression, 20-27 = Severe depression   Psychosocial Evaluation and Intervention: Psychosocial Evaluation - 12/08/18 1727      Psychosocial Evaluation & Interventions   Interventions  Stress management education;Encouraged to exercise with the program and follow exercise prescription    Comments  Counselor met with Ms. Robert Bellow Clarene [place]) today for initial psychosocial evaluation.  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  She has a limited support system with a friend; her sister in [place] and a hiece.  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  Ashyra sleeps well with the use of medications nightly and she has a good appetite but limited finances to afford good foods.  She has a significant history of depression and anxiety with a bipolar diagnosis 7-8 years ago resulting in her being on disability.  She has just begun a new medication several weeks ago for her mood and can't tell if it is helpful yet or not.  Margo has multiple stressors with her finances; her health conditions and her limited support system.  She has goals to increase her stamina and strength.   Counselor was able to obtain [place] a gas card that can be renewed every 3rd visit she attends this program to help free up some of her finances for food, etc.   She will be followed by staff and counselor.     Expected Outcomes  Short:  Prosperity will come consistently to this program to increase her stamina and strength and to cope with the stress in her life better.  Long:  Akiba will develop a positive self-care program for her health and mental health.     Continue Psychosocial Services   Follow up required by  counselor       Psychosocial Re-Evaluation: Psychosocial Re-Evaluation    [place] Name 12/01/18 1542             Psychosocial Re-Evaluation   Current issues with  Current Depression;Current Stress Concerns       Comments  Jesika said she started on a new antidepressant last week. Alegria was given printed info about the Women Heart Support group and may try to attend tomorrow. Emberly said she has been going to a Clinical cytogeneticist but not sure they have been helping  her lately.. I suggested that she talk to our counselor for any suggestions about other counselors. Holiday says has lives alone and has anxiety about even when her heart rate goes up.       Expected Outcomes  Stress techniques like pursed lip breathing. Her new antidepressant to help her more.        Comments  Brihanna said she lives on $850/month disability and gets only $60 food stamps. I suggested the [employer] [facility].          Initial Review   Source of Stress Concerns  Poor Coping Skills;Financial          Psychosocial Discharge (Final Psychosocial Re-Evaluation): Psychosocial Re-Evaluation - 12/01/18 1542      Psychosocial Re-Evaluation   Current issues with  Current Depression;Current Stress Concerns    Comments  Mylinh said she started on a new antidepressant last week. Stefani was given printed info about the [facility] group and may try to attend tomorrow. Josefine said she has been going to a Clinical cytogeneticist but not sure they have been helping her lately.. I suggested that she talk to our counselor for any suggestions about other counselors. Despina says has lives alone and has anxiety about even when her heart rate goes up.    Expected Outcomes  Stress techniques like pursed lip breathing. Her new antidepressant to help her more.     Comments  Jahyra said she lives on $850/month disability and gets only $60 food stamps. I suggested the [facility] which serves $4 lunches.       Initial Review    Source of Stress Concerns  Poor Coping Skills;Financial       Vocational Rehabilitation: Provide vocational rehab assistance to qualifying candidates.   Vocational Rehab Evaluation & Intervention: Vocational Rehab - 11/20/18 1507      Initial Vocational Rehab Evaluation & Intervention   Assessment shows need for Vocational Rehabilitation  No       Education: Education Goals: Education classes will be provided on a variety of topics geared toward better understanding of heart health and risk factor modification. Participant will state understanding/return demonstration of topics presented as noted by education test scores.  Learning Barriers/Preferences: Learning Barriers/Preferences - 11/20/18 1506      Learning Barriers/Preferences   Learning Barriers  None    Learning Preferences  Individual Instruction       Education Topics:  AED/CPR: - Group verbal and written instruction with the use of models to demonstrate the basic use of the AED with the basic ABC's of resuscitation.   General Nutrition Guidelines/Fats and Fiber: -Group instruction provided by verbal, written material, models and posters to present the general guidelines for heart healthy nutrition. Gives an explanation and review of dietary fats and fiber.   Cardiac Rehab from 12/22/2018 in [facility] Cardiac and Pulmonary Rehab  Date  12/01/18  Educator  Lattie Haw RD  Instruction Review Code  1- Verbalizes Understanding      Controlling Sodium/Reading Food Labels: -Group verbal and written material supporting the discussion of sodium use in heart healthy nutrition. Review and explanation with models, verbal and written materials for utilization of the food label.   Exercise Physiology & General Exercise Guidelines: - Group verbal and written instruction with models to review the exercise physiology of the cardiovascular system and associated critical values. Provides general exercise guidelines with specific  guidelines to those with heart or lung disease.    Cardiac Rehab  from 12/22/2018 in [facility] Cardiac and Pulmonary Rehab  Date  12/08/18  Educator  [facility]  Instruction Review Code  1- Verbalizes Understanding      Aerobic Exercise & Resistance Training: - Gives group verbal and written instruction on the various components of exercise. Focuses on aerobic and resistive training programs and the benefits of this training and how to safely progress through these programs..   Cardiac Rehab from 12/22/2018 in [facility] Cardiac and Pulmonary Rehab  Date  12/10/18  Educator  AS  Instruction Review Code  1- Verbalizes Understanding      Flexibility, Balance, Mind/Body Relaxation: Provides group verbal/written instruction on the benefits of flexibility and balance training, including mind/body exercise modes such as yoga, pilates and tai chi.  Demonstration and skill practice provided.   Stress and Anxiety: - Provides group verbal and written instruction about the health risks of elevated stress and causes of high stress.  Discuss the correlation between heart/lung disease and anxiety and treatment options. Review healthy ways to manage with stress and anxiety.   Depression: - Provides group verbal and written instruction on the correlation between heart/lung disease and depressed mood, treatment options, and the stigmas associated with seeking treatment.   Cardiac Rehab from 12/22/2018 in [facility] Cardiac and Pulmonary Rehab  Date  11/26/18  Educator  [facility]  Instruction Review Code  1- Verbalizes Understanding      Anatomy & Physiology of the Heart: - Group verbal and written instruction and models provide basic cardiac anatomy and physiology, with the coronary electrical and arterial systems. Review of Valvular disease and Heart Failure   Cardiac Rehab from 12/22/2018 in [facility] Cardiac and Pulmonary Rehab  Date  12/22/18  Educator  CE  Instruction Review Code  1- Verbalizes Understanding      Cardiac  Procedures: - Group verbal and written instruction to review commonly prescribed medications for heart disease. Reviews the medication, class of the drug, and side effects. Includes the steps to properly store meds and maintain the prescription regimen. (beta blockers and nitrates)   Cardiac Medications I: - Group verbal and written instruction to review commonly prescribed medications for heart disease. Reviews the medication, class of the drug, and side effects. Includes the steps to properly store meds and maintain the prescription regimen.   Cardiac Medications II: -Group verbal and written instruction to review commonly prescribed medications for heart disease. Reviews the medication, class of the drug, and side effects. (all other drug classes)    Go Sex-Intimacy & Heart Disease, Get SMART - Goal Setting: - Group verbal and written instruction through game format to discuss heart disease and the return to sexual intimacy. Provides group verbal and written material to discuss and apply goal setting through the application of the S.M.A.R.T. Method.   Other Matters of the Heart: - Provides group verbal, written materials and models to describe Stable Angina and Peripheral Artery. Includes description of the disease process and treatment options available to the cardiac patient.   Exercise & Equipment Safety: - Individual verbal instruction and demonstration of equipment use and safety with use of the equipment.   Cardiac Rehab from 12/22/2018 in [facility] Cardiac and Pulmonary Rehab  Date  11/20/18  Educator  [facility]  Instruction Review Code  1- Verbalizes Understanding      Infection Prevention: - Provides verbal and written material to individual with discussion of infection control including proper hand washing and proper equipment cleaning during exercise session.   Cardiac Rehab from 12/22/2018 in [facility] Cardiac  and Pulmonary Rehab  Date  11/20/18  Educator  [facility]  Instruction Review Code  1-  Verbalizes Understanding      Falls Prevention: - Provides verbal and written material to individual with discussion of falls prevention and safety.   Cardiac Rehab from 12/22/2018 in [facility] Cardiac and Pulmonary Rehab  Date  11/20/18  Educator  [facility]  Instruction Review Code  1- Verbalizes Understanding      Diabetes: - Individual verbal and written instruction to review signs/symptoms of diabetes, desired ranges of glucose level fasting, after meals and with exercise. Acknowledge that pre and post exercise glucose checks will be done for 3 sessions at entry of program.   Know Your Numbers and Risk Factors: -Group verbal and written instruction about important numbers in your health.  Discussion of what are risk factors and how they play a role in the disease process.  Review of Cholesterol, Blood Pressure, Diabetes, and BMI and the role they play in your overall health.   Sleep Hygiene: -Provides group verbal and written instruction about how sleep can affect your health.  Define sleep hygiene, discuss sleep cycles and impact of sleep habits. Review good sleep hygiene tips.    Other: -Provides group and verbal instruction on various topics (see comments)   Knowledge Questionnaire Score: Knowledge Questionnaire Score - 11/20/18 1221      Knowledge Questionnaire Score   Pre Score  25/26   correct answers reviewed with Marykatherine      Core Components/Risk Factors/Patient Goals at Admission: Personal Goals and Risk Factors at Admission - 11/20/18 1505      Core Components/Risk Factors/Patient Goals on Admission    Weight Management  Yes;Weight Gain    Intervention  Weight Management: Develop a combined nutrition and exercise program designed to reach desired caloric intake, while maintaining appropriate intake of nutrient and fiber, sodium and fats, and appropriate energy expenditure required for the weight goal.;Weight Management: Provide education and appropriate resources to help  participant work on and attain dietary goals.    Admit Weight  101 lb 14.4 oz (46.2 kg)    Goal Weight: Short Term  104 lb (47.2 kg)    Goal Weight: Long Term  115 lb (52.2 kg)    Expected Outcomes  Short Term: Continue to assess and modify interventions until short term weight is achieved;Long Term: Adherence to nutrition and physical activity/exercise program aimed toward attainment of established weight goal;Understanding recommendations for meals to include 15-35% energy as protein, 25-35% energy from fat, 35-60% energy from carbohydrates, less than 255m of dietary cholesterol, 20-35 gm of total fiber daily;Understanding of distribution of calorie intake throughout the day with the consumption of 4-5 meals/snacks;Weight Gain: Understanding of general recommendations for a high calorie, high protein meal plan that promotes weight gain by distributing calorie intake throughout the day with the consumption for 4-5 meals, snacks, and/or supplements    Tobacco Cessation  Yes    Intervention  Assist the participant in steps to quit. Provide individualized education and counseling about committing to Tobacco Cessation, relapse prevention, and pharmacological support that can be provided by physician.;OAdvice worker assist with locating and accessing local/national Quit Smoking programs, and support quit date choice.    Expected Outcomes  Short Term: Will demonstrate readiness to quit, by selecting a quit date.;Short Term: Will quit all tobacco product use, adhering to prevention of relapse plan.;Long Term: Complete abstinence from all tobacco products for at least 12 months from quit date.    Lipids  Yes    Intervention  Provide education and support for participant on nutrition & aerobic/resistive exercise along with prescribed medications to achieve LDL <71m, HDL >429m    Expected Outcomes  Short Term: Participant states understanding of desired cholesterol values and is compliant with  medications prescribed. Participant is following exercise prescription and nutrition guidelines.;Long Term: Cholesterol controlled with medications as prescribed, with individualized exercise RX and with personalized nutrition plan. Value goals: LDL < 7039mHDL > 40 mg.       Core Components/Risk Factors/Patient Goals Review:  Goals and Risk Factor Review    Row Name 12/01/18 1546 12/01/18 1747           Core Components/Risk Factors/Patient Goals Review   Personal Goals Review  Weight Management/Obesity;Tobacco Cessation;Lipids  Tobacco Cessation      Review  TarMakenizeid she is trying to gain weight and knows she wants to eat healthier. TarKiannahnt to the heart doctor today and she reports he was happy with her initial progress.   TarCharlesiaports that she quit smoking the day of her heart attack but "still struggles but I know I need to stay quit and not smoke".      Expected Outcomes  Heart healthy living including gaining weight so her BMI is more and she has more reserve.  Cont to not smoke cigarettes.         Core Components/Risk Factors/Patient Goals at Discharge (Final Review):  Goals and Risk Factor Review - 12/01/18 1747      Core Components/Risk Factors/Patient Goals Review   Personal Goals Review  Tobacco Cessation    Review  TarAddysynports that she quit smoking the day of her heart attack but "still struggles but I know I need to stay quit and not smoke".    Expected Outcomes  Cont to not smoke cigarettes.       ITP Comments: ITP Comments    Row Name 11/20/18 1458 12/17/18 1420         ITP Comments  Med Review completed. Initial ITP created. [facility]  [facility] day review completed. ITP sent to Dr. MarEmily Filbertedical Director of Cardiac Rehab. Continue with ITP unless changes are made by physician.         Comments: \I Meda Coffeelled TarDeanned she said she will calll us [place] after she sees her doctor for her bilateral hip pain that is keeping her up at night. TarZephaniahaid she liked Cardiac Rehab and knows it would help her so she will call us [place] after she sees her specialist doctor on Monday.

## 2019-01-14 ENCOUNTER — Encounter: Payer: Self-pay | Admitting: *Deleted

## 2019-01-14 DIAGNOSIS — Z951 Presence of aortocoronary bypass graft: Secondary | ICD-10-CM

## 2019-01-14 NOTE — Progress Notes (Signed)
Cardiac Individual Treatment Plan  Patient Details  Name: Melissa Montes MRN: [id number] Date of Birth: November 08, 1956 Referring Provider:     Cardiac Rehab from 11/20/2018 in [facility] Cardiac and Pulmonary Rehab  Referring Provider  Humphrey Rolls      Initial Encounter Date:    Cardiac Rehab from 11/20/2018 in [facility] Cardiac and Pulmonary Rehab  Date  11/20/18      Visit Diagnosis: S/P CABG x 2  Patient's Home Medications on Admission:  Current Outpatient Medications:  .  acetaminophen (TYLENOL) 325 MG tablet, Take 325-650 mg by mouth every 6 (six) hours as needed (for pain.)., Disp: , Rfl:  .  aspirin EC 81 MG tablet, Take 162 mg by mouth every evening. , Disp: , Rfl:  .  atorvastatin (LIPITOR) 80 MG tablet, Take 80 mg by mouth every evening. , Disp: , Rfl:  .  baclofen (LIORESAL) 10 MG tablet, Take 10 mg by mouth daily as needed for muscle spasms., Disp: , Rfl:  .  Calcium Carbonate-Vitamin D (CALTRATE 600+D PO), Take 600 mg by mouth 2 (two) times daily. , Disp: , Rfl:  .  diazepam (VALIUM) 10 MG tablet, Take 10 mg by mouth daily as needed for anxiety., Disp: , Rfl:  .  ezetimibe (ZETIA) 10 MG tablet, Take 10 mg by mouth daily. , Disp: , Rfl:  .  gabapentin (NEURONTIN) 100 MG capsule, Take 1 capsule by mouth daily., Disp: , Rfl: 2 .  gabapentin (NEURONTIN) 100 MG capsule, Take by mouth., Disp: , Rfl:  .  metoprolol succinate (TOPROL-XL) 25 MG 24 hr tablet, Take 25 mg by mouth daily. , Disp: , Rfl:  .  Multiple Vitamin (MULTIVITAMIN) tablet, Take 1 tablet by mouth daily. Centrum Silver for Women, Disp: , Rfl:  .  nitroGLYCERIN (NITROSTAT) 0.4 MG SL tablet, Place 0.4 mg under the tongue every 5 (five) minutes x 3 doses as needed for chest pain. , Disp: , Rfl:  .  ondansetron (ZOFRAN) 4 MG tablet, Take 1 tablet by mouth 4 (four) times daily., Disp: , Rfl:  .  traZODone (DESYREL) 100 MG tablet, Take 100 mg by mouth at bedtime. , Disp: , Rfl:   Past Medical History: Past Medical History:  Diagnosis  Date  . Anxiety   . Bipolar disorder ([place])   . Depression   . Heart disease   . Hyperlipidemia   . MI (myocardial infarction) ([place])     Tobacco Use: Social History   Tobacco Use  Smoking Status Former Smoker  . Packs/day: 1.00  . Years: 41.00  . Pack years: 41.00  . Types: Cigarettes  . Last attempt to quit: 09/19/2018  . Years since quitting: 0.3  Smokeless Tobacco Never Used    Labs: Recent Review Flowsheet Data    There is no flowsheet data to display.       Exercise Target Goals: Exercise Program Goal: Individual exercise prescription set using results from initial 6 min walk test and THRR while considering  patient's activity barriers and safety.   Exercise Prescription Goal: Initial exercise prescription builds to 30-45 minutes a day of aerobic activity, 2-3 days per week.  Home exercise guidelines will be given to patient during program as part of exercise prescription that the participant will acknowledge.  Activity Barriers & Risk Stratification: Activity Barriers & Cardiac Risk Stratification - 11/20/18 1512      Activity Barriers & Cardiac Risk Stratification   Activity Barriers  Back Problems;Shortness of Breath    Cardiac Risk Stratification  High       6 Minute Walk: 6 Minute Walk    Row Name 11/20/18 1424         6 Minute Walk   Phase  Initial     Distance  1250 feet     Walk Time  6 minutes     # of Rest Breaks  0     MPH  2.4     METS  3.8     RPE  11     Perceived Dyspnea   1     VO2 Peak  13.32     Symptoms  No     Resting HR  95 bpm     Resting BP  106/64     Resting Oxygen Saturation   93 %     Exercise Oxygen Saturation  during 6 min walk  97 %     Max Ex. HR  126 bpm     Max Ex. BP  120/68     2 Minute Post BP  108/66        Oxygen Initial Assessment:   Oxygen Re-Evaluation:   Oxygen Discharge (Final Oxygen Re-Evaluation):   Initial Exercise Prescription: Initial Exercise Prescription - 11/20/18 1400      Date  of Initial Exercise RX and Referring Provider   Date  11/20/18    Referring Provider  Humphrey Rolls      Treadmill   MPH  2.4    Grade  2    Minutes  15    METs  3.5      Recumbant Bike   Level  1    RPM  60    Minutes  15    METs  3.5      NuStep   Level  2    SPM  80    Minutes  15    METs  3.5      Prescription Details   Frequency (times per week)  3    Duration  Progress to 45 minutes of aerobic exercise without signs/symptoms of physical distress      Intensity   THRR 40-80% of Max Heartrate  120-145    Ratings of Perceived Exertion  11-13    Perceived Dyspnea  0-4      Resistance Training   Training Prescription  Yes    Weight  2 lb    Reps  10-15       Perform Capillary Blood Glucose checks as needed.  Exercise Prescription Changes: Exercise Prescription Changes    Row Name 11/20/18 1400 11/27/18 0800 12/11/18 1000 12/25/18 1100       Response to Exercise   Blood Pressure (Admit)  106/64  82/60 maybe typo?  102/60  110/70    Blood Pressure (Exercise)  120/68  130/68  122/62  122/62    Blood Pressure (Exit)  108/66  118/64  92/60  96/56    Heart Rate (Admit)  110 bpm  82 bpm  86 bpm  73 bpm    Heart Rate (Exercise)  126 bpm  118 bpm  116 bpm  112 bpm    Heart Rate (Exit)  100 bpm  81 bpm  84 bpm  97 bpm    Oxygen Saturation (Admit)  93 %  -  -  -    Oxygen Saturation (Exit)  97 %  -  -  -    Rating of Perceived Exertion (Exercise)  _0 16  Perceived Dyspnea (Exercise)  1  -  -  -    Symptoms  -  leg pain on TM pain reduced with lower incline / pt has PVD  -  -    Comments  -  1st day  -  -    Duration  -  Progress to 45 minutes of aerobic exercise without signs/symptoms of physical distress  Continue with 45 min of aerobic exercise without signs/symptoms of physical distress.  Continue with 45 min of aerobic exercise without signs/symptoms of physical distress.    Intensity  -  THRR unchanged  THRR unchanged  THRR unchanged      Progression    Progression  -  Continue to progress workloads to maintain intensity without signs/symptoms of physical distress.  Continue to progress workloads to maintain intensity without signs/symptoms of physical distress.  Continue to progress workloads to maintain intensity without signs/symptoms of physical distress.    Average METs  -  2  2.62  2.9      Resistance Training   Training Prescription  -  Yes  Yes  Yes    Weight  -  2 lb  3 lb  3 lb    Reps  -  10-15  10-15  10-15      Interval Training   Interval Training  -  -  No  No      Treadmill   MPH  -  1.4  1.5  -    Grade  -  2  1  -    Minutes  -  15  15  -    METs  -  2.46  2.36  -      Recumbant Bike   Level  -  -  1  1    RPM  -  -  60  60    Minutes  -  -  15  15    METs  -  -  3.5  3.6      NuStep   Level  -  _0 SPM  -  80  80  80    Minutes  -  _1 METs  -  1.6  2  2.2       Exercise Comments: Exercise Comments    Row Name 11/26/18 1616           Exercise Comments  First full day of exercise!  Patient was oriented to gym and equipment including functions, settings, policies, and procedures.  Patient's individual exercise prescription and treatment plan were reviewed.  All starting workloads were established based on the results of the 6 minute walk test done at initial orientation visit.  The plan for exercise progression was also introduced and progression will be customized based on patient's performance and goals.          Exercise Goals and Review: Exercise Goals    Row Name 11/20/18 1423             Exercise Goals   Increase Physical Activity  Yes       Intervention  Provide advice, education, support and counseling about physical activity/exercise needs.;Develop an individualized exercise prescription for aerobic and resistive training based on initial evaluation findings, risk stratification, comorbidities and participant's personal goals.       Expected Outcomes  Short Term: Attend  rehab on a regular basis to increase amount  of physical activity.;Long Term: Add in home exercise to make exercise part of routine and to increase amount of physical activity.;Long Term: Exercising regularly at least 3-5 days a week.       Increase Strength and Stamina  Yes       Intervention  Provide advice, education, support and counseling about physical activity/exercise needs.;Develop an individualized exercise prescription for aerobic and resistive training based on initial evaluation findings, risk stratification, comorbidities and participant's personal goals.       Expected Outcomes  Short Term: Increase workloads from initial exercise prescription for resistance, speed, and METs.;Short Term: Perform resistance training exercises routinely during rehab and add in resistance training at home;Long Term: Improve cardiorespiratory fitness, muscular endurance and strength as measured by increased METs and functional capacity (6MWT)       Able to understand and use rate of perceived exertion (RPE) scale  Yes       Intervention  Provide education and explanation on how to use RPE scale       Expected Outcomes  Short Term: Able to use RPE daily in rehab to express subjective intensity level;Long Term:  Able to use RPE to guide intensity level when exercising independently       Able to understand and use Dyspnea scale  Yes       Intervention  Provide education and explanation on how to use Dyspnea scale       Expected Outcomes  Short Term: Able to use Dyspnea scale daily in rehab to express subjective sense of shortness of breath during exertion;Long Term: Able to use Dyspnea scale to guide intensity level when exercising independently       Knowledge and understanding of Target Heart Rate Range (THRR)  Yes       Intervention  Provide education and explanation of THRR including how the numbers were predicted and where they are located for reference       Expected Outcomes  Short Term: Able to state/look  up THRR;Short Term: Able to use daily as guideline for intensity in rehab;Long Term: Able to use THRR to govern intensity when exercising independently       Able to check pulse independently  Yes       Intervention  Provide education and demonstration on how to check pulse in carotid and radial arteries.;Review the importance of being able to check your own pulse for safety during independent exercise       Expected Outcomes  Short Term: Able to explain why pulse checking is important during independent exercise;Long Term: Able to check pulse independently and accurately       Understanding of Exercise Prescription  Yes       Intervention  Provide education, explanation, and written materials on patient's individual exercise prescription       Expected Outcomes  Short Term: Able to explain program exercise prescription;Long Term: Able to explain home exercise prescription to exercise independently          Exercise Goals Re-Evaluation : Exercise Goals Re-Evaluation    Row Name 11/26/18 1616 12/11/18 1038 12/25/18 1121         Exercise Goal Re-Evaluation   Exercise Goals Review  -  Increase Physical Activity;Increase Strength and Stamina;Able to understand and use rate of perceived exertion (RPE) scale;Knowledge and understanding of Target Heart Rate Range (THRR);Able to check pulse independently;Understanding of Exercise Prescription  Increase Physical Activity;Increase Strength and Stamina;Able to understand and use rate of perceived exertion (RPE) scale;Knowledge and understanding  of Target Heart Rate Range (THRR);Able to check pulse independently     Comments  Reviewed RPE scale, THR and program prescription with pt today.  Pt voiced understanding and was given a copy of goals to take home.   Shontay states she is enjoying the program.  She has increased to 3 lb for strength work.  Staff will monitor progress.  Ailsa has been having hip pain.  Staff recommended PT screen.  Will follow up with [place].      Expected Outcomes  Short: Use RPE daily to regulate intensity. Long: Follow program prescription in THR  Short - continue to attend consistently Long - increase overall MET level  Short - get PT screen Long - exercise without hip pain        Discharge Exercise Prescription (Final Exercise Prescription Changes): Exercise Prescription Changes - 12/25/18 1100      Response to Exercise   Blood Pressure (Admit)  110/70    Blood Pressure (Exercise)  122/62    Blood Pressure (Exit)  96/56    Heart Rate (Admit)  73 bpm    Heart Rate (Exercise)  112 bpm    Heart Rate (Exit)  97 bpm    Rating of Perceived Exertion (Exercise)  16    Duration  Continue with 45 min of aerobic exercise without signs/symptoms of physical distress.    Intensity  THRR unchanged      Progression   Progression  Continue to progress workloads to maintain intensity without signs/symptoms of physical distress.    Average METs  2.9      Resistance Training   Training Prescription  Yes    Weight  3 lb    Reps  10-15      Interval Training   Interval Training  No      Recumbant Bike   Level  1    RPM  60    Minutes  15    METs  3.6      NuStep   Level  1    SPM  80    Minutes  15    METs  2.2       Nutrition:  Target Goals: Understanding of nutrition guidelines, daily intake of sodium '1500mg'$ , cholesterol '200mg'$ , calories 30% from fat and 7% or less from saturated fats, daily to have 5 or more servings of fruits and vegetables.  Biometrics: Pre Biometrics - 11/20/18 1423      Pre Biometrics   Height  '5\' 3"'$  (1.6 m)    Weight  101 lb 14.4 oz (46.2 kg)    Waist Circumference  25.5 inches    Hip Circumference  34.5 inches    Waist to Hip Ratio  0.74 %    BMI (Calculated)  18.06    Single Leg Stand  4.01 seconds        Nutrition Therapy Plan and Nutrition Goals: Nutrition Therapy & Goals - 12/01/18 1647      Nutrition Therapy   Diet  TLC    Drug/Food Interactions  Statins/Certain Fruits     Protein (specify units)  6-7oz    Fiber  20 grams    Whole Grain Foods  3 servings   does not always choose whole grains   Saturated Fats  12 max. grams    Fruits and Vegetables  4 servings/day   8 ideal. she does not eat vegetables and eats fruits infrequently   Sodium  1500 grams      Personal  Nutrition Goals   Nutrition Goal  Increase daily kcal intake by ~250kcal/day. Great job for starting to drink Boost high protein drinks! Other ways to add calories include making your homemade milkshakes, drinking milk, eating sources of fats like peanut butter, adding in an extra small meal or snack each day (eats 1 main meal/day currently)    Personal Goal #2  Consider drinking water more often, starting with one 8oz glass/day    Personal Goal #3  Add fruits into your diet more regularly. If choosing canned, look for varieties in their own 100% juice or light syrup    Comments  She is regaining wt she lost while hospitalized with goal wt of 115#. Lowest wt post CABG 97#. She purchased Boost high protein drinks two days ago in order to start adding in more calories/ protein daily. She lives on limited income and is alloted $60/month for groceries. Her diet consists mainly of bread, potatoes, pasta, chicken, rice, ham, beans occasionally, and peanut butter. She reports to also be a picky eater and does not care for vegetables. She only likes certain kinds of fruit. Snacks: muffins, rolls, fritos occasionally. Eats one meal/day around 2pm and spends the rest of the day "snacking." Beverages: several glasses of whole milk per day, coffee, Dr. Malachi Bonds, homemade milkshakes. Does not typically drink water. She does not eat eggs or seafood.      Intervention Plan   Intervention  Prescribe, educate and counsel regarding individualized specific dietary modifications aiming towards targeted core components such as weight, hypertension, lipid management, diabetes, heart failure and other comorbidities.    Expected  Outcomes  Short Term Goal: Understand basic principles of dietary content, such as calories, fat, sodium, cholesterol and nutrients.;Short Term Goal: A plan has been developed with personal nutrition goals set during dietitian appointment.;Long Term Goal: Adherence to prescribed nutrition plan.       Nutrition Assessments: Nutrition Assessments - 11/20/18 1511      MEDFICTS Scores   Pre Score  64       Nutrition Goals Re-Evaluation: Nutrition Goals Re-Evaluation    Row Name 12/01/18 1546 12/01/18 1658           Goals   Nutrition Goal  Eat healthier including a vegetable with each meal  Increase daily kcal intake by ~250kcal/day. Great job for starting to drink Boost high protein drinks! Other ways to add calories include making your homemade milkshakes, drinking milk, eating sources of fats like peanut butter, adding in an extra small meal or snack each day (eats 1 main meal/day currently)      Comment  -  She desires wt gain after losing wt in the hospital. BMI: 18. She would like to gain 10-15# from CBW to IBW 115#      Expected Outcome  Better HEart healthy nutrition that she can afford  Consistently eat 250kcal extra daily either via a nutritional shake like Boost or extra whole food sources        Personal Goal #2 Re-Evaluation   Personal Goal #2  -  Consider drinking water more often, starting with one 8oz glass per day        Personal Goal #3 Re-Evaluation   Personal Goal #3  -  Add fruits into your diet more regularly. If choosing canned, look for varieties in their own 100% juice or light syrup         Nutrition Goals Discharge (Final Nutrition Goals Re-Evaluation): Nutrition Goals Re-Evaluation - 12/01/18 1658  Goals   Nutrition Goal  Increase daily kcal intake by ~250kcal/day. Great job for starting to drink Boost high protein drinks! Other ways to add calories include making your homemade milkshakes, drinking milk, eating sources of fats like peanut butter, adding  in an extra small meal or snack each day (eats 1 main meal/day currently)    Comment  She desires wt gain after losing wt in the hospital. BMI: 18. She would like to gain 10-15# from CBW to IBW 115#    Expected Outcome  Consistently eat 250kcal extra daily either via a nutritional shake like Boost or extra whole food sources      Personal Goal #2 Re-Evaluation   Personal Goal #2  Consider drinking water more often, starting with one 8oz glass per day      Personal Goal #3 Re-Evaluation   Personal Goal #3  Add fruits into your diet more regularly. If choosing canned, look for varieties in their own 100% juice or light syrup       Psychosocial: Target Goals: Acknowledge presence or absence of significant depression and/or stress, maximize coping skills, provide positive support system. Participant is able to verbalize types and ability to use techniques and skills needed for reducing stress and depression.   Initial Review & Psychosocial Screening: Initial Psych Review & Screening - 11/20/18 1507      Initial Review   Current issues with  Current Depression;History of Depression;Current Anxiety/Panic;Current Stress Concerns    Source of Stress Concerns  Financial;Chronic Illness    Comments  Karole has been seeing a therapist for a while but is looking forward to learning more about mental health in our program. She lives alone, on disability. She reports having a support system, but a lot of them blame her smoking on why she needed surgery. 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 She states she was very anxious about coming to orientation but hopes to be able to attend class regularly now that she has met the staff.       Family Dynamics   Good Support System?  Yes   friends, family     Barriers   Psychosocial barriers to participate in program  There are no identifiable barriers or psychosocial needs.;The patient should benefit from  training in stress management and relaxation.      Screening Interventions   Interventions  Encouraged to exercise;Program counselor consult;Provide feedback about the scores to participant;To provide support and resources with identified psychosocial needs    Expected Outcomes  Short Term goal: Utilizing psychosocial counselor, staff and physician to assist with identification of specific Stressors or current issues interfering with healing process. Setting desired goal for each stressor or current issue identified.;Long Term Goal: Stressors or current issues are controlled or eliminated.;Short Term goal: Identification and review with participant of any Quality of Life or Depression concerns found by scoring the questionnaire.;Long Term goal: The participant improves quality of Life and PHQ9 Scores as seen by post scores and/or verbalization of changes       Quality of Life Scores:  Quality of Life - 11/20/18 1219      Quality of Life   Select  Quality of Life      Quality of Life Scores   Health/Function Pre  13 %    Socioeconomic Pre  22.67 %    Psych/Spiritual Pre  12.79 %    Family Pre  14.33 %  GLOBAL Pre  15.09 %      Scores of 19 and below usually indicate a poorer quality of life in these areas.  A difference of  2-3 points is a clinically meaningful difference.  A difference of 2-3 points in the total score of the Quality of Life Index has been associated with significant improvement in overall quality of life, self-image, physical symptoms, and general health in studies assessing change in quality of life.  PHQ-9: Recent Review Flowsheet Data    Depression screen [facility] 2/9 11/20/2018   Decreased Interest 1   Down, Depressed, Hopeless 3   PHQ - 2 Score 4   Altered sleeping 1   Tired, decreased energy 3   Change in appetite 2   Feeling bad or failure about yourself  2   Trouble concentrating 1   Moving slowly or fidgety/restless 0   Suicidal thoughts 0   PHQ-9 Score 13    Difficult doing work/chores Somewhat difficult     Interpretation of Total Score  Total Score Depression Severity:  1-4 = Minimal depression, 5-9 = Mild depression, 10-14 = Moderate depression, 15-19 = Moderately severe depression, 20-27 = Severe depression   Psychosocial Evaluation and Intervention: Psychosocial Evaluation - 12/08/18 1727      Psychosocial Evaluation & Interventions   Interventions  Stress management education;Encouraged to exercise with the program and follow exercise prescription    Comments  Counselor met with Ms. Robert Bellow Clarene [place]) today for initial psychosocial evaluation.  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  She has a limited support system with a friend; her sister in [place] and a hiece.  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  Makaylin sleeps well with the use of medications nightly and she has a good appetite but limited finances to afford good foods.  She has a significant history of depression and anxiety with a bipolar diagnosis 7-8 years ago resulting in her being on disability.  She has just begun a new medication several weeks ago for her mood and can't tell if it is helpful yet or not.  Belma has multiple stressors with her finances; her health conditions and her limited support system.  She has goals to increase her stamina and strength.   Counselor was able to obtain [place] a gas card that can be renewed every 3rd visit she attends this program to help free up some of her finances for food, etc.   She will be followed by staff and counselor.     Expected Outcomes  Short:  Mercedies will come consistently to this program to increase her stamina and strength and to cope with the stress in her life better.  Long:  Flannery will develop a positive self-care program for her health and mental health.     Continue Psychosocial Services   Follow up required by counselor        Psychosocial Re-Evaluation: Psychosocial Re-Evaluation    Moody Name 12/01/18 1542             Psychosocial Re-Evaluation   Current issues with  Current Depression;Current Stress Concerns       Comments  Tazia said she started on a new antidepressant last week. Norleen was given printed info about the Women [facility] Support group and may try to attend tomorrow. Tippi said she has been going to a Clinical cytogeneticist but not sure they have been helping  her lately.. I suggested that she talk to our counselor for any suggestions about other counselors. Sheneika says has lives alone and has anxiety about even when her heart rate goes up.       Expected Outcomes  Stress techniques like pursed lip breathing. Her new antidepressant to help her more.        Comments  Jeananne said she lives on $850/month disability and gets only $60 food stamps. I suggested the [facility] [facility] which serves $4 lunches.          Initial Review   Source of Stress Concerns  Poor Coping Skills;Financial          Psychosocial Discharge (Final Psychosocial Re-Evaluation): Psychosocial Re-Evaluation - 12/01/18 1542      Psychosocial Re-Evaluation   Current issues with  Current Depression;Current Stress Concerns    Comments  Tokiko said she started on a new antidepressant last week. Nygeria was given printed info about the Women [facility] Support group and may try to attend tomorrow. Shekita said she has been going to a Clinical cytogeneticist but not sure they have been helping her lately.. I suggested that she talk to our counselor for any suggestions about other counselors. Ranisha says has lives alone and has anxiety about even when her heart rate goes up.    Expected Outcomes  Stress techniques like pursed lip breathing. Her new antidepressant to help her more.     Comments  Akili said she lives on $850/month disability and gets only $60 food stamps. I suggested the [facility] which serves $4 lunches.       Initial Review   Source of  Stress Concerns  Poor Coping Skills;Financial       Vocational Rehabilitation: Provide vocational rehab assistance to qualifying candidates.   Vocational Rehab Evaluation & Intervention: Vocational Rehab - 11/20/18 1507      Initial Vocational Rehab Evaluation & Intervention   Assessment shows need for Vocational Rehabilitation  No       Education: Education Goals: Education classes will be provided on a variety of topics geared toward better understanding of heart health and risk factor modification. Participant will state understanding/return demonstration of topics presented as noted by education test scores.  Learning Barriers/Preferences: Learning Barriers/Preferences - 11/20/18 1506      Learning Barriers/Preferences   Learning Barriers  None    Learning Preferences  Individual Instruction       Education Topics:  AED/CPR: - Group verbal and written instruction with the use of models to demonstrate the basic use of the AED with the basic ABC's of resuscitation.   General Nutrition Guidelines/Fats and Fiber: -Group instruction provided by verbal, written material, models and posters to present the general guidelines for heart healthy nutrition. Gives an explanation and review of dietary fats and fiber.   Cardiac Rehab from 12/22/2018 in [facility] Cardiac and Pulmonary Rehab  Date  12/01/18  Educator  Lattie Haw RD  Instruction Review Code  1- Verbalizes Understanding      Controlling Sodium/Reading Food Labels: -Group verbal and written material supporting the discussion of sodium use in heart healthy nutrition. Review and explanation with models, verbal and written materials for utilization of the food label.   Exercise Physiology & General Exercise Guidelines: - Group verbal and written instruction with models to review the exercise physiology of the cardiovascular system and associated critical values. Provides general exercise guidelines with specific guidelines to those  with heart or lung disease.    Cardiac Rehab  from 12/22/2018 in [name] Cardiac and Pulmonary Rehab  Date  12/08/18  Educator  [facility]  Instruction Review Code  1- Verbalizes Understanding      Aerobic Exercise & Resistance Training: - Gives group verbal and written instruction on the various components of exercise. Focuses on aerobic and resistive training programs and the benefits of this training and how to safely progress through these programs..   Cardiac Rehab from 12/22/2018 in [facility] Cardiac and Pulmonary Rehab  Date  12/10/18  Educator  AS  Instruction Review Code  1- Verbalizes Understanding      Flexibility, Balance, Mind/Body Relaxation: Provides group verbal/written instruction on the benefits of flexibility and balance training, including mind/body exercise modes such as yoga, pilates and tai chi.  Demonstration and skill practice provided.   Stress and Anxiety: - Provides group verbal and written instruction about the health risks of elevated stress and causes of high stress.  Discuss the correlation between heart/lung disease and anxiety and treatment options. Review healthy ways to manage with stress and anxiety.   Depression: - Provides group verbal and written instruction on the correlation between heart/lung disease and depressed mood, treatment options, and the stigmas associated with seeking treatment.   Cardiac Rehab from 12/22/2018 in [facility] Cardiac and Pulmonary Rehab  Date  11/26/18  Educator  [facility]  Instruction Review Code  1- Verbalizes Understanding      Anatomy & Physiology of the Heart: - Group verbal and written instruction and models provide basic cardiac anatomy and physiology, with the coronary electrical and arterial systems. Review of Valvular disease and Heart Failure   Cardiac Rehab from 12/22/2018 in [facility] Cardiac and Pulmonary Rehab  Date  12/22/18  Educator  CE  Instruction Review Code  1- Verbalizes Understanding      Cardiac Procedures: - Group verbal  and written instruction to review commonly prescribed medications for heart disease. Reviews the medication, class of the drug, and side effects. Includes the steps to properly store meds and maintain the prescription regimen. (beta blockers and nitrates)   Cardiac Medications I: - Group verbal and written instruction to review commonly prescribed medications for heart disease. Reviews the medication, class of the drug, and side effects. Includes the steps to properly store meds and maintain the prescription regimen.   Cardiac Medications II: -Group verbal and written instruction to review commonly prescribed medications for heart disease. Reviews the medication, class of the drug, and side effects. (all other drug classes)    Go Sex-Intimacy & Heart Disease, Get SMART - Goal Setting: - Group verbal and written instruction through game format to discuss heart disease and the return to sexual intimacy. Provides group verbal and written material to discuss and apply goal setting through the application of the S.M.A.R.T. Method.   Other Matters of the Heart: - Provides group verbal, written materials and models to describe Stable Angina and Peripheral Artery. Includes description of the disease process and treatment options available to the cardiac patient.   Exercise & Equipment Safety: - Individual verbal instruction and demonstration of equipment use and safety with use of the equipment.   Cardiac Rehab from 12/22/2018 in [facility] Cardiac and Pulmonary Rehab  Date  11/20/18  Educator  [facility]  Instruction Review Code  1- Verbalizes Understanding      Infection Prevention: - Provides verbal and written material to individual with discussion of infection control including proper hand washing and proper equipment cleaning during exercise session.   Cardiac Rehab from 12/22/2018 in [facility] Cardiac  and Pulmonary Rehab  Date  11/20/18  Educator  [facility]  Instruction Review Code  1- Verbalizes Understanding       Falls Prevention: - Provides verbal and written material to individual with discussion of falls prevention and safety.   Cardiac Rehab from 12/22/2018 in [facility] Cardiac and Pulmonary Rehab  Date  11/20/18  Educator  [facility]  Instruction Review Code  1- Verbalizes Understanding      Diabetes: - Individual verbal and written instruction to review signs/symptoms of diabetes, desired ranges of glucose level fasting, after meals and with exercise. Acknowledge that pre and post exercise glucose checks will be done for 3 sessions at entry of program.   Know Your Numbers and Risk Factors: -Group verbal and written instruction about important numbers in your health.  Discussion of what are risk factors and how they play a role in the disease process.  Review of Cholesterol, Blood Pressure, Diabetes, and BMI and the role they play in your overall health.   Sleep Hygiene: -Provides group verbal and written instruction about how sleep can affect your health.  Define sleep hygiene, discuss sleep cycles and impact of sleep habits. Review good sleep hygiene tips.    Other: -Provides group and verbal instruction on various topics (see comments)   Knowledge Questionnaire Score: Knowledge Questionnaire Score - 11/20/18 1221      Knowledge Questionnaire Score   Pre Score  25/26   correct answers reviewed with Fontaine      Core Components/Risk Factors/Patient Goals at Admission: Personal Goals and Risk Factors at Admission - 11/20/18 1505      Core Components/Risk Factors/Patient Goals on Admission    Weight Management  Yes;Weight Gain    Intervention  Weight Management: Develop a combined nutrition and exercise program designed to reach desired caloric intake, while maintaining appropriate intake of nutrient and fiber, sodium and fats, and appropriate energy expenditure required for the weight goal.;Weight Management: Provide education and appropriate resources to help participant work on and attain  dietary goals.    Admit Weight  101 lb 14.4 oz (46.2 kg)    Goal Weight: Short Term  104 lb (47.2 kg)    Goal Weight: Long Term  115 lb (52.2 kg)    Expected Outcomes  Short Term: Continue to assess and modify interventions until short term weight is achieved;Long Term: Adherence to nutrition and physical activity/exercise program aimed toward attainment of established weight goal;Understanding recommendations for meals to include 15-35% energy as protein, 25-35% energy from fat, 35-60% energy from carbohydrates, less than 283m of dietary cholesterol, 20-35 gm of total fiber daily;Understanding of distribution of calorie intake throughout the day with the consumption of 4-5 meals/snacks;Weight Gain: Understanding of general recommendations for a high calorie, high protein meal plan that promotes weight gain by distributing calorie intake throughout the day with the consumption for 4-5 meals, snacks, and/or supplements    Tobacco Cessation  Yes    Intervention  Assist the participant in steps to quit. Provide individualized education and counseling about committing to Tobacco Cessation, relapse prevention, and pharmacological support that can be provided by physician.;OAdvice worker assist with locating and accessing local/national Quit Smoking programs, and support quit date choice.    Expected Outcomes  Short Term: Will demonstrate readiness to quit, by selecting a quit date.;Short Term: Will quit all tobacco product use, adhering to prevention of relapse plan.;Long Term: Complete abstinence from all tobacco products for at least 12 months from quit date.    Lipids  Yes    Intervention  Provide education and support for participant on nutrition & aerobic/resistive exercise along with prescribed medications to achieve LDL <68m, HDL >440m    Expected Outcomes  Short Term: Participant states understanding of desired cholesterol values and is compliant with medications prescribed.  Participant is following exercise prescription and nutrition guidelines.;Long Term: Cholesterol controlled with medications as prescribed, with individualized exercise RX and with personalized nutrition plan. Value goals: LDL < 7022mHDL > 40 mg.       Core Components/Risk Factors/Patient Goals Review:  Goals and Risk Factor Review    Row Name 12/01/18 1546 12/01/18 1747           Core Components/Risk Factors/Patient Goals Review   Personal Goals Review  Weight Management/Obesity;Tobacco Cessation;Lipids  Tobacco Cessation      Review  TarTrenityid she is trying to gain weight and knows she wants to eat healthier. TarKenedynt to the heart doctor today and she reports he was happy with her initial progress.   TarJeniyahports that she quit smoking the day of her heart attack but "still struggles but I know I need to stay quit and not smoke".      Expected Outcomes  Heart healthy living including gaining weight so her BMI is more and she has more reserve.  Cont to not smoke cigarettes.         Core Components/Risk Factors/Patient Goals at Discharge (Final Review):  Goals and Risk Factor Review - 12/01/18 1747      Core Components/Risk Factors/Patient Goals Review   Personal Goals Review  Tobacco Cessation    Review  TarPashaports that she quit smoking the day of her heart attack but "still struggles but I know I need to stay quit and not smoke".    Expected Outcomes  Cont to not smoke cigarettes.       ITP Comments: ITP Comments    Row Name 11/20/18 1458 12/17/18 1420 01/14/19 0650       ITP Comments  Med Review completed. Initial ITP created. [facility]  30 day review completed. ITP sent to Dr. MarEmily Filbertedical Director of Cardiac Rehab. Continue with ITP unless changes are made by physician.  30 day review. Continue with ITP unless directed changes by Medical Director chart review.        Comments:

## 2019-01-14 NOTE — Progress Notes (Signed)
Cardiac Individual Treatment Plan  Patient Details  Name: Melissa Montes MRN: [id number] Date of Birth: November 08, 1956 Referring Provider:     Cardiac Rehab from 11/20/2018 in [facility] Cardiac and Pulmonary Rehab  Referring Provider  Humphrey Rolls      Initial Encounter Date:    Cardiac Rehab from 11/20/2018 in [facility] Cardiac and Pulmonary Rehab  Date  11/20/18      Visit Diagnosis: S/P CABG x 2  Patient's Home Medications on Admission:  Current Outpatient Medications:  .  acetaminophen (TYLENOL) 325 MG tablet, Take 325-650 mg by mouth every 6 (six) hours as needed (for pain.)., Disp: , Rfl:  .  aspirin EC 81 MG tablet, Take 162 mg by mouth every evening. , Disp: , Rfl:  .  atorvastatin (LIPITOR) 80 MG tablet, Take 80 mg by mouth every evening. , Disp: , Rfl:  .  baclofen (LIORESAL) 10 MG tablet, Take 10 mg by mouth daily as needed for muscle spasms., Disp: , Rfl:  .  Calcium Carbonate-Vitamin D (CALTRATE 600+D PO), Take 600 mg by mouth 2 (two) times daily. , Disp: , Rfl:  .  diazepam (VALIUM) 10 MG tablet, Take 10 mg by mouth daily as needed for anxiety., Disp: , Rfl:  .  ezetimibe (ZETIA) 10 MG tablet, Take 10 mg by mouth daily. , Disp: , Rfl:  .  gabapentin (NEURONTIN) 100 MG capsule, Take 1 capsule by mouth daily., Disp: , Rfl: 2 .  gabapentin (NEURONTIN) 100 MG capsule, Take by mouth., Disp: , Rfl:  .  metoprolol succinate (TOPROL-XL) 25 MG 24 hr tablet, Take 25 mg by mouth daily. , Disp: , Rfl:  .  Multiple Vitamin (MULTIVITAMIN) tablet, Take 1 tablet by mouth daily. Centrum Silver for Women, Disp: , Rfl:  .  nitroGLYCERIN (NITROSTAT) 0.4 MG SL tablet, Place 0.4 mg under the tongue every 5 (five) minutes x 3 doses as needed for chest pain. , Disp: , Rfl:  .  ondansetron (ZOFRAN) 4 MG tablet, Take 1 tablet by mouth 4 (four) times daily., Disp: , Rfl:  .  traZODone (DESYREL) 100 MG tablet, Take 100 mg by mouth at bedtime. , Disp: , Rfl:   Past Medical History: Past Medical History:  Diagnosis  Date  . Anxiety   . Bipolar disorder ([place])   . Depression   . Heart disease   . Hyperlipidemia   . MI (myocardial infarction) ([place])     Tobacco Use: Social History   Tobacco Use  Smoking Status Former Smoker  . Packs/day: 1.00  . Years: 41.00  . Pack years: 41.00  . Types: Cigarettes  . Last attempt to quit: 09/19/2018  . Years since quitting: 0.3  Smokeless Tobacco Never Used    Labs: Recent Review Flowsheet Data    There is no flowsheet data to display.       Exercise Target Goals: Exercise Program Goal: Individual exercise prescription set using results from initial 6 min walk test and THRR while considering  patient's activity barriers and safety.   Exercise Prescription Goal: Initial exercise prescription builds to 30-45 minutes a day of aerobic activity, 2-3 days per week.  Home exercise guidelines will be given to patient during program as part of exercise prescription that the participant will acknowledge.  Activity Barriers & Risk Stratification: Activity Barriers & Cardiac Risk Stratification - 11/20/18 1512      Activity Barriers & Cardiac Risk Stratification   Activity Barriers  Back Problems;Shortness of Breath    Cardiac Risk Stratification  High       6 Minute Walk: 6 Minute Walk    Row Name 11/20/18 1424         6 Minute Walk   Phase  Initial     Distance  1250 feet     Walk Time  6 minutes     # of Rest Breaks  0     MPH  2.4     METS  3.8     RPE  11     Perceived Dyspnea   1     VO2 Peak  13.32     Symptoms  No     Resting HR  95 bpm     Resting BP  106/64     Resting Oxygen Saturation   93 %     Exercise Oxygen Saturation  during 6 min walk  97 %     Max Ex. HR  126 bpm     Max Ex. BP  120/68     2 Minute Post BP  108/66        Oxygen Initial Assessment:   Oxygen Re-Evaluation:   Oxygen Discharge (Final Oxygen Re-Evaluation):   Initial Exercise Prescription: Initial Exercise Prescription - 11/20/18 1400      Date  of Initial Exercise RX and Referring Provider   Date  11/20/18    Referring Provider  Humphrey Rolls      Treadmill   MPH  2.4    Grade  2    Minutes  15    METs  3.5      Recumbant Bike   Level  1    RPM  60    Minutes  15    METs  3.5      NuStep   Level  2    SPM  80    Minutes  15    METs  3.5      Prescription Details   Frequency (times per week)  3    Duration  Progress to 45 minutes of aerobic exercise without signs/symptoms of physical distress      Intensity   THRR 40-80% of Max Heartrate  120-145    Ratings of Perceived Exertion  11-13    Perceived Dyspnea  0-4      Resistance Training   Training Prescription  Yes    Weight  2 lb    Reps  10-15       Perform Capillary Blood Glucose checks as needed.  Exercise Prescription Changes: Exercise Prescription Changes    Row Name 11/20/18 1400 11/27/18 0800 12/11/18 1000 12/25/18 1100       Response to Exercise   Blood Pressure (Admit)  106/64  82/60 maybe typo?  102/60  110/70    Blood Pressure (Exercise)  120/68  130/68  122/62  122/62    Blood Pressure (Exit)  108/66  118/64  92/60  96/56    Heart Rate (Admit)  110 bpm  82 bpm  86 bpm  73 bpm    Heart Rate (Exercise)  126 bpm  118 bpm  116 bpm  112 bpm    Heart Rate (Exit)  100 bpm  81 bpm  84 bpm  97 bpm    Oxygen Saturation (Admit)  93 %  -  -  -    Oxygen Saturation (Exit)  97 %  -  -  -    Rating of Perceived Exertion (Exercise)  _0 16  Perceived Dyspnea (Exercise)  1  -  -  -    Symptoms  -  leg pain on TM pain reduced with lower incline / pt has PVD  -  -    Comments  -  1st day  -  -    Duration  -  Progress to 45 minutes of aerobic exercise without signs/symptoms of physical distress  Continue with 45 min of aerobic exercise without signs/symptoms of physical distress.  Continue with 45 min of aerobic exercise without signs/symptoms of physical distress.    Intensity  -  THRR unchanged  THRR unchanged  THRR unchanged      Progression    Progression  -  Continue to progress workloads to maintain intensity without signs/symptoms of physical distress.  Continue to progress workloads to maintain intensity without signs/symptoms of physical distress.  Continue to progress workloads to maintain intensity without signs/symptoms of physical distress.    Average METs  -  2  2.62  2.9      Resistance Training   Training Prescription  -  Yes  Yes  Yes    Weight  -  2 lb  3 lb  3 lb    Reps  -  10-15  10-15  10-15      Interval Training   Interval Training  -  -  No  No      Treadmill   MPH  -  1.4  1.5  -    Grade  -  2  1  -    Minutes  -  15  15  -    METs  -  2.46  2.36  -      Recumbant Bike   Level  -  -  1  1    RPM  -  -  60  60    Minutes  -  -  15  15    METs  -  -  3.5  3.6      NuStep   Level  -  _0 SPM  -  80  80  80    Minutes  -  _1 METs  -  1.6  2  2.2       Exercise Comments: Exercise Comments    Row Name 11/26/18 1616           Exercise Comments  First full day of exercise!  Patient was oriented to gym and equipment including functions, settings, policies, and procedures.  Patient's individual exercise prescription and treatment plan were reviewed.  All starting workloads were established based on the results of the 6 minute walk test done at initial orientation visit.  The plan for exercise progression was also introduced and progression will be customized based on patient's performance and goals.          Exercise Goals and Review: Exercise Goals    Row Name 11/20/18 1423             Exercise Goals   Increase Physical Activity  Yes       Intervention  Provide advice, education, support and counseling about physical activity/exercise needs.;Develop an individualized exercise prescription for aerobic and resistive training based on initial evaluation findings, risk stratification, comorbidities and participant's personal goals.       Expected Outcomes  Short Term: Attend  rehab on a regular basis to increase amount  of physical activity.;Long Term: Add in home exercise to make exercise part of routine and to increase amount of physical activity.;Long Term: Exercising regularly at least 3-5 days a week.       Increase Strength and Stamina  Yes       Intervention  Provide advice, education, support and counseling about physical activity/exercise needs.;Develop an individualized exercise prescription for aerobic and resistive training based on initial evaluation findings, risk stratification, comorbidities and participant's personal goals.       Expected Outcomes  Short Term: Increase workloads from initial exercise prescription for resistance, speed, and METs.;Short Term: Perform resistance training exercises routinely during rehab and add in resistance training at home;Long Term: Improve cardiorespiratory fitness, muscular endurance and strength as measured by increased METs and functional capacity (6MWT)       Able to understand and use rate of perceived exertion (RPE) scale  Yes       Intervention  Provide education and explanation on how to use RPE scale       Expected Outcomes  Short Term: Able to use RPE daily in rehab to express subjective intensity level;Long Term:  Able to use RPE to guide intensity level when exercising independently       Able to understand and use Dyspnea scale  Yes       Intervention  Provide education and explanation on how to use Dyspnea scale       Expected Outcomes  Short Term: Able to use Dyspnea scale daily in rehab to express subjective sense of shortness of breath during exertion;Long Term: Able to use Dyspnea scale to guide intensity level when exercising independently       Knowledge and understanding of Target Heart Rate Range (THRR)  Yes       Intervention  Provide education and explanation of THRR including how the numbers were predicted and where they are located for reference       Expected Outcomes  Short Term: Able to state/look  up THRR;Short Term: Able to use daily as guideline for intensity in rehab;Long Term: Able to use THRR to govern intensity when exercising independently       Able to check pulse independently  Yes       Intervention  Provide education and demonstration on how to check pulse in carotid and radial arteries.;Review the importance of being able to check your own pulse for safety during independent exercise       Expected Outcomes  Short Term: Able to explain why pulse checking is important during independent exercise;Long Term: Able to check pulse independently and accurately       Understanding of Exercise Prescription  Yes       Intervention  Provide education, explanation, and written materials on patient's individual exercise prescription       Expected Outcomes  Short Term: Able to explain program exercise prescription;Long Term: Able to explain home exercise prescription to exercise independently          Exercise Goals Re-Evaluation : Exercise Goals Re-Evaluation    Row Name 11/26/18 1616 12/11/18 1038 12/25/18 1121         Exercise Goal Re-Evaluation   Exercise Goals Review  -  Increase Physical Activity;Increase Strength and Stamina;Able to understand and use rate of perceived exertion (RPE) scale;Knowledge and understanding of Target Heart Rate Range (THRR);Able to check pulse independently;Understanding of Exercise Prescription  Increase Physical Activity;Increase Strength and Stamina;Able to understand and use rate of perceived exertion (RPE) scale;Knowledge and understanding  of Target Heart Rate Range (THRR);Able to check pulse independently     Comments  Reviewed RPE scale, THR and program prescription with pt today.  Pt voiced understanding and was given a copy of goals to take home.   Makalia states she is enjoying the program.  She has increased to 3 lb for strength work.  Staff will monitor progress.  Nariyah has been having hip pain.  Staff recommended PT screen.  Will follow up with [place].      Expected Outcomes  Short: Use RPE daily to regulate intensity. Long: Follow program prescription in THR  Short - continue to attend consistently Long - increase overall MET level  Short - get PT screen Long - exercise without hip pain        Discharge Exercise Prescription (Final Exercise Prescription Changes): Exercise Prescription Changes - 12/25/18 1100      Response to Exercise   Blood Pressure (Admit)  110/70    Blood Pressure (Exercise)  122/62    Blood Pressure (Exit)  96/56    Heart Rate (Admit)  73 bpm    Heart Rate (Exercise)  112 bpm    Heart Rate (Exit)  97 bpm    Rating of Perceived Exertion (Exercise)  16    Duration  Continue with 45 min of aerobic exercise without signs/symptoms of physical distress.    Intensity  THRR unchanged      Progression   Progression  Continue to progress workloads to maintain intensity without signs/symptoms of physical distress.    Average METs  2.9      Resistance Training   Training Prescription  Yes    Weight  3 lb    Reps  10-15      Interval Training   Interval Training  No      Recumbant Bike   Level  1    RPM  60    Minutes  15    METs  3.6      NuStep   Level  1    SPM  80    Minutes  15    METs  2.2       Nutrition:  Target Goals: Understanding of nutrition guidelines, daily intake of sodium '1500mg'$ , cholesterol '200mg'$ , calories 30% from fat and 7% or less from saturated fats, daily to have 5 or more servings of fruits and vegetables.  Biometrics: Pre Biometrics - 11/20/18 1423      Pre Biometrics   Height  '5\' 3"'$  (1.6 m)    Weight  101 lb 14.4 oz (46.2 kg)    Waist Circumference  25.5 inches    Hip Circumference  34.5 inches    Waist to Hip Ratio  0.74 %    BMI (Calculated)  18.06    Single Leg Stand  4.01 seconds        Nutrition Therapy Plan and Nutrition Goals: Nutrition Therapy & Goals - 12/01/18 1647      Nutrition Therapy   Diet  TLC    Drug/Food Interactions  Statins/Certain Fruits     Protein (specify units)  6-7oz    Fiber  20 grams    Whole Grain Foods  3 servings   does not always choose whole grains   Saturated Fats  12 max. grams    Fruits and Vegetables  4 servings/day   8 ideal. she does not eat vegetables and eats fruits infrequently   Sodium  1500 grams      Personal  Nutrition Goals   Nutrition Goal  Increase daily kcal intake by ~250kcal/day. Great job for starting to drink Boost high protein drinks! Other ways to add calories include making your homemade milkshakes, drinking milk, eating sources of fats like peanut butter, adding in an extra small meal or snack each day (eats 1 main meal/day currently)    Personal Goal #2  Consider drinking water more often, starting with one 8oz glass/day    Personal Goal #3  Add fruits into your diet more regularly. If choosing canned, look for varieties in their own 100% juice or light syrup    Comments  She is regaining wt she lost while hospitalized with goal wt of 115#. Lowest wt post CABG 97#. She purchased Boost high protein drinks two days ago in order to start adding in more calories/ protein daily. She lives on limited income and is alloted $60/month for groceries. Her diet consists mainly of bread, potatoes, pasta, chicken, rice, ham, beans occasionally, and peanut butter. She reports to also be a picky eater and does not care for vegetables. She only likes certain kinds of fruit. Snacks: muffins, rolls, fritos occasionally. Eats one meal/day around 2pm and spends the rest of the day "snacking." Beverages: several glasses of whole milk per day, coffee, Dr. Malachi Bonds, homemade milkshakes. Does not typically drink water. She does not eat eggs or seafood.      Intervention Plan   Intervention  Prescribe, educate and counsel regarding individualized specific dietary modifications aiming towards targeted core components such as weight, hypertension, lipid management, diabetes, heart failure and other comorbidities.    Expected  Outcomes  Short Term Goal: Understand basic principles of dietary content, such as calories, fat, sodium, cholesterol and nutrients.;Short Term Goal: A plan has been developed with personal nutrition goals set during dietitian appointment.;Long Term Goal: Adherence to prescribed nutrition plan.       Nutrition Assessments: Nutrition Assessments - 11/20/18 1511      MEDFICTS Scores   Pre Score  64       Nutrition Goals Re-Evaluation: Nutrition Goals Re-Evaluation    Row Name 12/01/18 1546 12/01/18 1658           Goals   Nutrition Goal  Eat healthier including a vegetable with each meal  Increase daily kcal intake by ~250kcal/day. Great job for starting to drink Boost high protein drinks! Other ways to add calories include making your homemade milkshakes, drinking milk, eating sources of fats like peanut butter, adding in an extra small meal or snack each day (eats 1 main meal/day currently)      Comment  -  She desires wt gain after losing wt in the hospital. BMI: 18. She would like to gain 10-15# from CBW to IBW 115#      Expected Outcome  Better HEart healthy nutrition that she can afford  Consistently eat 250kcal extra daily either via a nutritional shake like Boost or extra whole food sources        Personal Goal #2 Re-Evaluation   Personal Goal #2  -  Consider drinking water more often, starting with one 8oz glass per day        Personal Goal #3 Re-Evaluation   Personal Goal #3  -  Add fruits into your diet more regularly. If choosing canned, look for varieties in their own 100% juice or light syrup         Nutrition Goals Discharge (Final Nutrition Goals Re-Evaluation): Nutrition Goals Re-Evaluation - 12/01/18 1658  Goals   Nutrition Goal  Increase daily kcal intake by ~250kcal/day. Great job for starting to drink Boost high protein drinks! Other ways to add calories include making your homemade milkshakes, drinking milk, eating sources of fats like peanut butter, adding  in an extra small meal or snack each day (eats 1 main meal/day currently)    Comment  She desires wt gain after losing wt in the hospital. BMI: 18. She would like to gain 10-15# from CBW to IBW 115#    Expected Outcome  Consistently eat 250kcal extra daily either via a nutritional shake like Boost or extra whole food sources      Personal Goal #2 Re-Evaluation   Personal Goal #2  Consider drinking water more often, starting with one 8oz glass per day      Personal Goal #3 Re-Evaluation   Personal Goal #3  Add fruits into your diet more regularly. If choosing canned, look for varieties in their own 100% juice or light syrup       Psychosocial: Target Goals: Acknowledge presence or absence of significant depression and/or stress, maximize coping skills, provide positive support system. Participant is able to verbalize types and ability to use techniques and skills needed for reducing stress and depression.   Initial Review & Psychosocial Screening: Initial Psych Review & Screening - 11/20/18 1507      Initial Review   Current issues with  Current Depression;History of Depression;Current Anxiety/Panic;Current Stress Concerns    Source of Stress Concerns  Financial;Chronic Illness    Comments  Karole has been seeing a therapist for a while but is looking forward to learning more about mental health in our program. She lives alone, on disability. She reports having a support system, but a lot of them blame her smoking on why she needed surgery. 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 She states she was very anxious about coming to orientation but hopes to be able to attend class regularly now that she has met the staff.       Family Dynamics   Good Support System?  Yes   friends, family     Barriers   Psychosocial barriers to participate in program  There are no identifiable barriers or psychosocial needs.;The patient should benefit from  training in stress management and relaxation.      Screening Interventions   Interventions  Encouraged to exercise;Program counselor consult;Provide feedback about the scores to participant;To provide support and resources with identified psychosocial needs    Expected Outcomes  Short Term goal: Utilizing psychosocial counselor, staff and physician to assist with identification of specific Stressors or current issues interfering with healing process. Setting desired goal for each stressor or current issue identified.;Long Term Goal: Stressors or current issues are controlled or eliminated.;Short Term goal: Identification and review with participant of any Quality of Life or Depression concerns found by scoring the questionnaire.;Long Term goal: The participant improves quality of Life and PHQ9 Scores as seen by post scores and/or verbalization of changes       Quality of Life Scores:  Quality of Life - 11/20/18 1219      Quality of Life   Select  Quality of Life      Quality of Life Scores   Health/Function Pre  13 %    Socioeconomic Pre  22.67 %    Psych/Spiritual Pre  12.79 %    Family Pre  14.33 %  GLOBAL Pre  15.09 %      Scores of 19 and below usually indicate a poorer quality of life in these areas.  A difference of  2-3 points is a clinically meaningful difference.  A difference of 2-3 points in the total score of the Quality of Life Index has been associated with significant improvement in overall quality of life, self-image, physical symptoms, and general health in studies assessing change in quality of life.  PHQ-9: Recent Review Flowsheet Data    Depression screen [facility] 2/9 11/20/2018   Decreased Interest 1   Down, Depressed, Hopeless 3   PHQ - 2 Score 4   Altered sleeping 1   Tired, decreased energy 3   Change in appetite 2   Feeling bad or failure about yourself  2   Trouble concentrating 1   Moving slowly or fidgety/restless 0   Suicidal thoughts 0   PHQ-9 Score 13    Difficult doing work/chores Somewhat difficult     Interpretation of Total Score  Total Score Depression Severity:  1-4 = Minimal depression, 5-9 = Mild depression, 10-14 = Moderate depression, 15-19 = Moderately severe depression, 20-27 = Severe depression   Psychosocial Evaluation and Intervention: Psychosocial Evaluation - 12/08/18 1727      Psychosocial Evaluation & Interventions   Interventions  Stress management education;Encouraged to exercise with the program and follow exercise prescription    Comments  Counselor met with Ms. Robert Bellow Clarene [place]) today for initial psychosocial evaluation.  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  She has a limited support system with a friend; her sister in [place] and a hiece.  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  Makaylin sleeps well with the use of medications nightly and she has a good appetite but limited finances to afford good foods.  She has a significant history of depression and anxiety with a bipolar diagnosis 7-8 years ago resulting in her being on disability.  She has just begun a new medication several weeks ago for her mood and can't tell if it is helpful yet or not.  Belma has multiple stressors with her finances; her health conditions and her limited support system.  She has goals to increase her stamina and strength.   Counselor was able to obtain [place] a gas card that can be renewed every 3rd visit she attends this program to help free up some of her finances for food, etc.   She will be followed by staff and counselor.     Expected Outcomes  Short:  Mercedies will come consistently to this program to increase her stamina and strength and to cope with the stress in her life better.  Long:  Flannery will develop a positive self-care program for her health and mental health.     Continue Psychosocial Services   Follow up required by counselor        Psychosocial Re-Evaluation: Psychosocial Re-Evaluation    Moody Name 12/01/18 1542             Psychosocial Re-Evaluation   Current issues with  Current Depression;Current Stress Concerns       Comments  Tazia said she started on a new antidepressant last week. Norleen was given printed info about the Women [facility] Support group and may try to attend tomorrow. Tippi said she has been going to a Clinical cytogeneticist but not sure they have been helping  her lately.. I suggested that she talk to our counselor for any suggestions about other counselors. Sheneika says has lives alone and has anxiety about even when her heart rate goes up.       Expected Outcomes  Stress techniques like pursed lip breathing. Her new antidepressant to help her more.        Comments  Jeananne said she lives on $850/month disability and gets only $60 food stamps. I suggested the [facility] [facility] which serves $4 lunches.          Initial Review   Source of Stress Concerns  Poor Coping Skills;Financial          Psychosocial Discharge (Final Psychosocial Re-Evaluation): Psychosocial Re-Evaluation - 12/01/18 1542      Psychosocial Re-Evaluation   Current issues with  Current Depression;Current Stress Concerns    Comments  Tokiko said she started on a new antidepressant last week. Nygeria was given printed info about the Women [facility] Support group and may try to attend tomorrow. Shekita said she has been going to a Clinical cytogeneticist but not sure they have been helping her lately.. I suggested that she talk to our counselor for any suggestions about other counselors. Ranisha says has lives alone and has anxiety about even when her heart rate goes up.    Expected Outcomes  Stress techniques like pursed lip breathing. Her new antidepressant to help her more.     Comments  Akili said she lives on $850/month disability and gets only $60 food stamps. I suggested the [facility] which serves $4 lunches.       Initial Review   Source of  Stress Concerns  Poor Coping Skills;Financial       Vocational Rehabilitation: Provide vocational rehab assistance to qualifying candidates.   Vocational Rehab Evaluation & Intervention: Vocational Rehab - 11/20/18 1507      Initial Vocational Rehab Evaluation & Intervention   Assessment shows need for Vocational Rehabilitation  No       Education: Education Goals: Education classes will be provided on a variety of topics geared toward better understanding of heart health and risk factor modification. Participant will state understanding/return demonstration of topics presented as noted by education test scores.  Learning Barriers/Preferences: Learning Barriers/Preferences - 11/20/18 1506      Learning Barriers/Preferences   Learning Barriers  None    Learning Preferences  Individual Instruction       Education Topics:  AED/CPR: - Group verbal and written instruction with the use of models to demonstrate the basic use of the AED with the basic ABC's of resuscitation.   General Nutrition Guidelines/Fats and Fiber: -Group instruction provided by verbal, written material, models and posters to present the general guidelines for heart healthy nutrition. Gives an explanation and review of dietary fats and fiber.   Cardiac Rehab from 12/22/2018 in [facility] Cardiac and Pulmonary Rehab  Date  12/01/18  Educator  Lattie Haw RD  Instruction Review Code  1- Verbalizes Understanding      Controlling Sodium/Reading Food Labels: -Group verbal and written material supporting the discussion of sodium use in heart healthy nutrition. Review and explanation with models, verbal and written materials for utilization of the food label.   Exercise Physiology & General Exercise Guidelines: - Group verbal and written instruction with models to review the exercise physiology of the cardiovascular system and associated critical values. Provides general exercise guidelines with specific guidelines to those  with heart or lung disease.    Cardiac Rehab  from 12/22/2018 in [name] Cardiac and Pulmonary Rehab  Date  12/08/18  Educator  [facility]  Instruction Review Code  1- Verbalizes Understanding      Aerobic Exercise & Resistance Training: - Gives group verbal and written instruction on the various components of exercise. Focuses on aerobic and resistive training programs and the benefits of this training and how to safely progress through these programs..   Cardiac Rehab from 12/22/2018 in [facility] Cardiac and Pulmonary Rehab  Date  12/10/18  Educator  AS  Instruction Review Code  1- Verbalizes Understanding      Flexibility, Balance, Mind/Body Relaxation: Provides group verbal/written instruction on the benefits of flexibility and balance training, including mind/body exercise modes such as yoga, pilates and tai chi.  Demonstration and skill practice provided.   Stress and Anxiety: - Provides group verbal and written instruction about the health risks of elevated stress and causes of high stress.  Discuss the correlation between heart/lung disease and anxiety and treatment options. Review healthy ways to manage with stress and anxiety.   Depression: - Provides group verbal and written instruction on the correlation between heart/lung disease and depressed mood, treatment options, and the stigmas associated with seeking treatment.   Cardiac Rehab from 12/22/2018 in [facility] Cardiac and Pulmonary Rehab  Date  11/26/18  Educator  [facility]  Instruction Review Code  1- Verbalizes Understanding      Anatomy & Physiology of the Heart: - Group verbal and written instruction and models provide basic cardiac anatomy and physiology, with the coronary electrical and arterial systems. Review of Valvular disease and Heart Failure   Cardiac Rehab from 12/22/2018 in [facility] Cardiac and Pulmonary Rehab  Date  12/22/18  Educator  CE  Instruction Review Code  1- Verbalizes Understanding      Cardiac Procedures: - Group verbal  and written instruction to review commonly prescribed medications for heart disease. Reviews the medication, class of the drug, and side effects. Includes the steps to properly store meds and maintain the prescription regimen. (beta blockers and nitrates)   Cardiac Medications I: - Group verbal and written instruction to review commonly prescribed medications for heart disease. Reviews the medication, class of the drug, and side effects. Includes the steps to properly store meds and maintain the prescription regimen.   Cardiac Medications II: -Group verbal and written instruction to review commonly prescribed medications for heart disease. Reviews the medication, class of the drug, and side effects. (all other drug classes)    Go Sex-Intimacy & Heart Disease, Get SMART - Goal Setting: - Group verbal and written instruction through game format to discuss heart disease and the return to sexual intimacy. Provides group verbal and written material to discuss and apply goal setting through the application of the S.M.A.R.T. Method.   Other Matters of the Heart: - Provides group verbal, written materials and models to describe Stable Angina and Peripheral Artery. Includes description of the disease process and treatment options available to the cardiac patient.   Exercise & Equipment Safety: - Individual verbal instruction and demonstration of equipment use and safety with use of the equipment.   Cardiac Rehab from 12/22/2018 in [facility] Cardiac and Pulmonary Rehab  Date  11/20/18  Educator  [facility]  Instruction Review Code  1- Verbalizes Understanding      Infection Prevention: - Provides verbal and written material to individual with discussion of infection control including proper hand washing and proper equipment cleaning during exercise session.   Cardiac Rehab from 12/22/2018 in [facility] Cardiac  and Pulmonary Rehab  Date  11/20/18  Educator  [facility]  Instruction Review Code  1- Verbalizes Understanding       Falls Prevention: - Provides verbal and written material to individual with discussion of falls prevention and safety.   Cardiac Rehab from 12/22/2018 in [facility] Cardiac and Pulmonary Rehab  Date  11/20/18  Educator  [facility]  Instruction Review Code  1- Verbalizes Understanding      Diabetes: - Individual verbal and written instruction to review signs/symptoms of diabetes, desired ranges of glucose level fasting, after meals and with exercise. Acknowledge that pre and post exercise glucose checks will be done for 3 sessions at entry of program.   Know Your Numbers and Risk Factors: -Group verbal and written instruction about important numbers in your health.  Discussion of what are risk factors and how they play a role in the disease process.  Review of Cholesterol, Blood Pressure, Diabetes, and BMI and the role they play in your overall health.   Sleep Hygiene: -Provides group verbal and written instruction about how sleep can affect your health.  Define sleep hygiene, discuss sleep cycles and impact of sleep habits. Review good sleep hygiene tips.    Other: -Provides group and verbal instruction on various topics (see comments)   Knowledge Questionnaire Score: Knowledge Questionnaire Score - 11/20/18 1221      Knowledge Questionnaire Score   Pre Score  25/26   correct answers reviewed with Blessyn      Core Components/Risk Factors/Patient Goals at Admission: Personal Goals and Risk Factors at Admission - 11/20/18 1505      Core Components/Risk Factors/Patient Goals on Admission    Weight Management  Yes;Weight Gain    Intervention  Weight Management: Develop a combined nutrition and exercise program designed to reach desired caloric intake, while maintaining appropriate intake of nutrient and fiber, sodium and fats, and appropriate energy expenditure required for the weight goal.;Weight Management: Provide education and appropriate resources to help participant work on and attain  dietary goals.    Admit Weight  101 lb 14.4 oz (46.2 kg)    Goal Weight: Short Term  104 lb (47.2 kg)    Goal Weight: Long Term  115 lb (52.2 kg)    Expected Outcomes  Short Term: Continue to assess and modify interventions until short term weight is achieved;Long Term: Adherence to nutrition and physical activity/exercise program aimed toward attainment of established weight goal;Understanding recommendations for meals to include 15-35% energy as protein, 25-35% energy from fat, 35-60% energy from carbohydrates, less than 283m of dietary cholesterol, 20-35 gm of total fiber daily;Understanding of distribution of calorie intake throughout the day with the consumption of 4-5 meals/snacks;Weight Gain: Understanding of general recommendations for a high calorie, high protein meal plan that promotes weight gain by distributing calorie intake throughout the day with the consumption for 4-5 meals, snacks, and/or supplements    Tobacco Cessation  Yes    Intervention  Assist the participant in steps to quit. Provide individualized education and counseling about committing to Tobacco Cessation, relapse prevention, and pharmacological support that can be provided by physician.;OAdvice worker assist with locating and accessing local/national Quit Smoking programs, and support quit date choice.    Expected Outcomes  Short Term: Will demonstrate readiness to quit, by selecting a quit date.;Short Term: Will quit all tobacco product use, adhering to prevention of relapse plan.;Long Term: Complete abstinence from all tobacco products for at least 12 months from quit date.    Lipids  Yes    Intervention  Provide education and support for participant on nutrition & aerobic/resistive exercise along with prescribed medications to achieve LDL <68m, HDL >440m    Expected Outcomes  Short Term: Participant states understanding of desired cholesterol values and is compliant with medications prescribed.  Participant is following exercise prescription and nutrition guidelines.;Long Term: Cholesterol controlled with medications as prescribed, with individualized exercise RX and with personalized nutrition plan. Value goals: LDL < 7022mHDL > 40 mg.       Core Components/Risk Factors/Patient Goals Review:  Goals and Risk Factor Review    Row Name 12/01/18 1546 12/01/18 1747           Core Components/Risk Factors/Patient Goals Review   Personal Goals Review  Weight Management/Obesity;Tobacco Cessation;Lipids  Tobacco Cessation      Review  TarTrenityid she is trying to gain weight and knows she wants to eat healthier. TarKenedynt to the heart doctor today and she reports he was happy with her initial progress.   TarJeniyahports that she quit smoking the day of her heart attack but "still struggles but I know I need to stay quit and not smoke".      Expected Outcomes  Heart healthy living including gaining weight so her BMI is more and she has more reserve.  Cont to not smoke cigarettes.         Core Components/Risk Factors/Patient Goals at Discharge (Final Review):  Goals and Risk Factor Review - 12/01/18 1747      Core Components/Risk Factors/Patient Goals Review   Personal Goals Review  Tobacco Cessation    Review  TarPashaports that she quit smoking the day of her heart attack but "still struggles but I know I need to stay quit and not smoke".    Expected Outcomes  Cont to not smoke cigarettes.       ITP Comments: ITP Comments    Row Name 11/20/18 1458 12/17/18 1420 01/14/19 0650       ITP Comments  Med Review completed. Initial ITP created. [facility]  30 day review completed. ITP sent to Dr. MarEmily Filbertedical Director of Cardiac Rehab. Continue with ITP unless changes are made by physician.  30 day review. Continue with ITP unless directed changes by Medical Director chart review.        Comments:

## 2019-01-14 NOTE — Progress Notes (Signed)
Cardiac Individual Treatment Plan  Patient Details  Name: Melissa Montes MRN: [id number] Date of Birth: November 08, 1956 Referring Provider:     Cardiac Rehab from 11/20/2018 in [facility] Cardiac and Pulmonary Rehab  Referring Provider  Humphrey Rolls      Initial Encounter Date:    Cardiac Rehab from 11/20/2018 in [facility] Cardiac and Pulmonary Rehab  Date  11/20/18      Visit Diagnosis: S/P CABG x 2  Patient's Home Medications on Admission:  Current Outpatient Medications:  .  acetaminophen (TYLENOL) 325 MG tablet, Take 325-650 mg by mouth every 6 (six) hours as needed (for pain.)., Disp: , Rfl:  .  aspirin EC 81 MG tablet, Take 162 mg by mouth every evening. , Disp: , Rfl:  .  atorvastatin (LIPITOR) 80 MG tablet, Take 80 mg by mouth every evening. , Disp: , Rfl:  .  baclofen (LIORESAL) 10 MG tablet, Take 10 mg by mouth daily as needed for muscle spasms., Disp: , Rfl:  .  Calcium Carbonate-Vitamin D (CALTRATE 600+D PO), Take 600 mg by mouth 2 (two) times daily. , Disp: , Rfl:  .  diazepam (VALIUM) 10 MG tablet, Take 10 mg by mouth daily as needed for anxiety., Disp: , Rfl:  .  ezetimibe (ZETIA) 10 MG tablet, Take 10 mg by mouth daily. , Disp: , Rfl:  .  gabapentin (NEURONTIN) 100 MG capsule, Take 1 capsule by mouth daily., Disp: , Rfl: 2 .  gabapentin (NEURONTIN) 100 MG capsule, Take by mouth., Disp: , Rfl:  .  metoprolol succinate (TOPROL-XL) 25 MG 24 hr tablet, Take 25 mg by mouth daily. , Disp: , Rfl:  .  Multiple Vitamin (MULTIVITAMIN) tablet, Take 1 tablet by mouth daily. Centrum Silver for Women, Disp: , Rfl:  .  nitroGLYCERIN (NITROSTAT) 0.4 MG SL tablet, Place 0.4 mg under the tongue every 5 (five) minutes x 3 doses as needed for chest pain. , Disp: , Rfl:  .  ondansetron (ZOFRAN) 4 MG tablet, Take 1 tablet by mouth 4 (four) times daily., Disp: , Rfl:  .  traZODone (DESYREL) 100 MG tablet, Take 100 mg by mouth at bedtime. , Disp: , Rfl:   Past Medical History: Past Medical History:  Diagnosis  Date  . Anxiety   . Bipolar disorder ([place])   . Depression   . Heart disease   . Hyperlipidemia   . MI (myocardial infarction) ([place])     Tobacco Use: Social History   Tobacco Use  Smoking Status Former Smoker  . Packs/day: 1.00  . Years: 41.00  . Pack years: 41.00  . Types: Cigarettes  . Last attempt to quit: 09/19/2018  . Years since quitting: 0.3  Smokeless Tobacco Never Used    Labs: Recent Review Flowsheet Data    There is no flowsheet data to display.       Exercise Target Goals: Exercise Program Goal: Individual exercise prescription set using results from initial 6 min walk test and THRR while considering  patient's activity barriers and safety.   Exercise Prescription Goal: Initial exercise prescription builds to 30-45 minutes a day of aerobic activity, 2-3 days per week.  Home exercise guidelines will be given to patient during program as part of exercise prescription that the participant will acknowledge.  Activity Barriers & Risk Stratification: Activity Barriers & Cardiac Risk Stratification - 11/20/18 1512      Activity Barriers & Cardiac Risk Stratification   Activity Barriers  Back Problems;Shortness of Breath    Cardiac Risk Stratification  High       6 Minute Walk: 6 Minute Walk    Row Name 11/20/18 1424         6 Minute Walk   Phase  Initial     Distance  1250 feet     Walk Time  6 minutes     # of Rest Breaks  0     MPH  2.4     METS  3.8     RPE  11     Perceived Dyspnea   1     VO2 Peak  13.32     Symptoms  No     Resting HR  95 bpm     Resting BP  106/64     Resting Oxygen Saturation   93 %     Exercise Oxygen Saturation  during 6 min walk  97 %     Max Ex. HR  126 bpm     Max Ex. BP  120/68     2 Minute Post BP  108/66        Oxygen Initial Assessment:   Oxygen Re-Evaluation:   Oxygen Discharge (Final Oxygen Re-Evaluation):   Initial Exercise Prescription: Initial Exercise Prescription - 11/20/18 1400      Date  of Initial Exercise RX and Referring Provider   Date  11/20/18    Referring Provider  Humphrey Rolls      Treadmill   MPH  2.4    Grade  2    Minutes  15    METs  3.5      Recumbant Bike   Level  1    RPM  60    Minutes  15    METs  3.5      NuStep   Level  2    SPM  80    Minutes  15    METs  3.5      Prescription Details   Frequency (times per week)  3    Duration  Progress to 45 minutes of aerobic exercise without signs/symptoms of physical distress      Intensity   THRR 40-80% of Max Heartrate  120-145    Ratings of Perceived Exertion  11-13    Perceived Dyspnea  0-4      Resistance Training   Training Prescription  Yes    Weight  2 lb    Reps  10-15       Perform Capillary Blood Glucose checks as needed.  Exercise Prescription Changes: Exercise Prescription Changes    Row Name 11/20/18 1400 11/27/18 0800 12/11/18 1000 12/25/18 1100       Response to Exercise   Blood Pressure (Admit)  106/64  82/60 maybe typo?  102/60  110/70    Blood Pressure (Exercise)  120/68  130/68  122/62  122/62    Blood Pressure (Exit)  108/66  118/64  92/60  96/56    Heart Rate (Admit)  110 bpm  82 bpm  86 bpm  73 bpm    Heart Rate (Exercise)  126 bpm  118 bpm  116 bpm  112 bpm    Heart Rate (Exit)  100 bpm  81 bpm  84 bpm  97 bpm    Oxygen Saturation (Admit)  93 %  -  -  -    Oxygen Saturation (Exit)  97 %  -  -  -    Rating of Perceived Exertion (Exercise)  _0 16  Perceived Dyspnea (Exercise)  1  -  -  -    Symptoms  -  leg pain on TM pain reduced with lower incline / pt has PVD  -  -    Comments  -  1st day  -  -    Duration  -  Progress to 45 minutes of aerobic exercise without signs/symptoms of physical distress  Continue with 45 min of aerobic exercise without signs/symptoms of physical distress.  Continue with 45 min of aerobic exercise without signs/symptoms of physical distress.    Intensity  -  THRR unchanged  THRR unchanged  THRR unchanged      Progression    Progression  -  Continue to progress workloads to maintain intensity without signs/symptoms of physical distress.  Continue to progress workloads to maintain intensity without signs/symptoms of physical distress.  Continue to progress workloads to maintain intensity without signs/symptoms of physical distress.    Average METs  -  2  2.62  2.9      Resistance Training   Training Prescription  -  Yes  Yes  Yes    Weight  -  2 lb  3 lb  3 lb    Reps  -  10-15  10-15  10-15      Interval Training   Interval Training  -  -  No  No      Treadmill   MPH  -  1.4  1.5  -    Grade  -  2  1  -    Minutes  -  15  15  -    METs  -  2.46  2.36  -      Recumbant Bike   Level  -  -  1  1    RPM  -  -  60  60    Minutes  -  -  15  15    METs  -  -  3.5  3.6      NuStep   Level  -  _0 SPM  -  80  80  80    Minutes  -  _1 METs  -  1.6  2  2.2       Exercise Comments: Exercise Comments    Row Name 11/26/18 1616           Exercise Comments  First full day of exercise!  Patient was oriented to gym and equipment including functions, settings, policies, and procedures.  Patient's individual exercise prescription and treatment plan were reviewed.  All starting workloads were established based on the results of the 6 minute walk test done at initial orientation visit.  The plan for exercise progression was also introduced and progression will be customized based on patient's performance and goals.          Exercise Goals and Review: Exercise Goals    Row Name 11/20/18 1423             Exercise Goals   Increase Physical Activity  Yes       Intervention  Provide advice, education, support and counseling about physical activity/exercise needs.;Develop an individualized exercise prescription for aerobic and resistive training based on initial evaluation findings, risk stratification, comorbidities and participant's personal goals.       Expected Outcomes  Short Term: Attend  rehab on a regular basis to increase amount  of physical activity.;Long Term: Add in home exercise to make exercise part of routine and to increase amount of physical activity.;Long Term: Exercising regularly at least 3-5 days a week.       Increase Strength and Stamina  Yes       Intervention  Provide advice, education, support and counseling about physical activity/exercise needs.;Develop an individualized exercise prescription for aerobic and resistive training based on initial evaluation findings, risk stratification, comorbidities and participant's personal goals.       Expected Outcomes  Short Term: Increase workloads from initial exercise prescription for resistance, speed, and METs.;Short Term: Perform resistance training exercises routinely during rehab and add in resistance training at home;Long Term: Improve cardiorespiratory fitness, muscular endurance and strength as measured by increased METs and functional capacity (6MWT)       Able to understand and use rate of perceived exertion (RPE) scale  Yes       Intervention  Provide education and explanation on how to use RPE scale       Expected Outcomes  Short Term: Able to use RPE daily in rehab to express subjective intensity level;Long Term:  Able to use RPE to guide intensity level when exercising independently       Able to understand and use Dyspnea scale  Yes       Intervention  Provide education and explanation on how to use Dyspnea scale       Expected Outcomes  Short Term: Able to use Dyspnea scale daily in rehab to express subjective sense of shortness of breath during exertion;Long Term: Able to use Dyspnea scale to guide intensity level when exercising independently       Knowledge and understanding of Target Heart Rate Range (THRR)  Yes       Intervention  Provide education and explanation of THRR including how the numbers were predicted and where they are located for reference       Expected Outcomes  Short Term: Able to state/look  up THRR;Short Term: Able to use daily as guideline for intensity in rehab;Long Term: Able to use THRR to govern intensity when exercising independently       Able to check pulse independently  Yes       Intervention  Provide education and demonstration on how to check pulse in carotid and radial arteries.;Review the importance of being able to check your own pulse for safety during independent exercise       Expected Outcomes  Short Term: Able to explain why pulse checking is important during independent exercise;Long Term: Able to check pulse independently and accurately       Understanding of Exercise Prescription  Yes       Intervention  Provide education, explanation, and written materials on patient's individual exercise prescription       Expected Outcomes  Short Term: Able to explain program exercise prescription;Long Term: Able to explain home exercise prescription to exercise independently          Exercise Goals Re-Evaluation : Exercise Goals Re-Evaluation    Row Name 11/26/18 1616 12/11/18 1038 12/25/18 1121         Exercise Goal Re-Evaluation   Exercise Goals Review  -  Increase Physical Activity;Increase Strength and Stamina;Able to understand and use rate of perceived exertion (RPE) scale;Knowledge and understanding of Target Heart Rate Range (THRR);Able to check pulse independently;Understanding of Exercise Prescription  Increase Physical Activity;Increase Strength and Stamina;Able to understand and use rate of perceived exertion (RPE) scale;Knowledge and understanding  of Target Heart Rate Range (THRR);Able to check pulse independently     Comments  Reviewed RPE scale, THR and program prescription with pt today.  Pt voiced understanding and was given a copy of goals to take home.   Gwendoline states she is enjoying the program.  She has increased to 3 lb for strength work.  Staff will monitor progress.  Dallana has been having hip pain.  Staff recommended PT screen.  Will follow up with [place].      Expected Outcomes  Short: Use RPE daily to regulate intensity. Long: Follow program prescription in THR  Short - continue to attend consistently Long - increase overall MET level  Short - get PT screen Long - exercise without hip pain        Discharge Exercise Prescription (Final Exercise Prescription Changes): Exercise Prescription Changes - 12/25/18 1100      Response to Exercise   Blood Pressure (Admit)  110/70    Blood Pressure (Exercise)  122/62    Blood Pressure (Exit)  96/56    Heart Rate (Admit)  73 bpm    Heart Rate (Exercise)  112 bpm    Heart Rate (Exit)  97 bpm    Rating of Perceived Exertion (Exercise)  16    Duration  Continue with 45 min of aerobic exercise without signs/symptoms of physical distress.    Intensity  THRR unchanged      Progression   Progression  Continue to progress workloads to maintain intensity without signs/symptoms of physical distress.    Average METs  2.9      Resistance Training   Training Prescription  Yes    Weight  3 lb    Reps  10-15      Interval Training   Interval Training  No      Recumbant Bike   Level  1    RPM  60    Minutes  15    METs  3.6      NuStep   Level  1    SPM  80    Minutes  15    METs  2.2       Nutrition:  Target Goals: Understanding of nutrition guidelines, daily intake of sodium '1500mg'$ , cholesterol '200mg'$ , calories 30% from fat and 7% or less from saturated fats, daily to have 5 or more servings of fruits and vegetables.  Biometrics: Pre Biometrics - 11/20/18 1423      Pre Biometrics   Height  '5\' 3"'$  (1.6 m)    Weight  101 lb 14.4 oz (46.2 kg)    Waist Circumference  25.5 inches    Hip Circumference  34.5 inches    Waist to Hip Ratio  0.74 %    BMI (Calculated)  18.06    Single Leg Stand  4.01 seconds        Nutrition Therapy Plan and Nutrition Goals: Nutrition Therapy & Goals - 12/01/18 1647      Nutrition Therapy   Diet  TLC    Drug/Food Interactions  Statins/Certain Fruits     Protein (specify units)  6-7oz    Fiber  20 grams    Whole Grain Foods  3 servings   does not always choose whole grains   Saturated Fats  12 max. grams    Fruits and Vegetables  4 servings/day   8 ideal. she does not eat vegetables and eats fruits infrequently   Sodium  1500 grams      Personal  Nutrition Goals   Nutrition Goal  Increase daily kcal intake by ~250kcal/day. Great job for starting to drink Boost high protein drinks! Other ways to add calories include making your homemade milkshakes, drinking milk, eating sources of fats like peanut butter, adding in an extra small meal or snack each day (eats 1 main meal/day currently)    Personal Goal #2  Consider drinking water more often, starting with one 8oz glass/day    Personal Goal #3  Add fruits into your diet more regularly. If choosing canned, look for varieties in their own 100% juice or light syrup    Comments  She is regaining wt she lost while hospitalized with goal wt of 115#. Lowest wt post CABG 97#. She purchased Boost high protein drinks two days ago in order to start adding in more calories/ protein daily. She lives on limited income and is alloted $60/month for groceries. Her diet consists mainly of bread, potatoes, pasta, chicken, rice, ham, beans occasionally, and peanut butter. She reports to also be a picky eater and does not care for vegetables. She only likes certain kinds of fruit. Snacks: muffins, rolls, fritos occasionally. Eats one meal/day around 2pm and spends the rest of the day "snacking." Beverages: several glasses of whole milk per day, coffee, Dr. Malachi Bonds, homemade milkshakes. Does not typically drink water. She does not eat eggs or seafood.      Intervention Plan   Intervention  Prescribe, educate and counsel regarding individualized specific dietary modifications aiming towards targeted core components such as weight, hypertension, lipid management, diabetes, heart failure and other comorbidities.    Expected  Outcomes  Short Term Goal: Understand basic principles of dietary content, such as calories, fat, sodium, cholesterol and nutrients.;Short Term Goal: A plan has been developed with personal nutrition goals set during dietitian appointment.;Long Term Goal: Adherence to prescribed nutrition plan.       Nutrition Assessments: Nutrition Assessments - 11/20/18 1511      MEDFICTS Scores   Pre Score  64       Nutrition Goals Re-Evaluation: Nutrition Goals Re-Evaluation    Row Name 12/01/18 1546 12/01/18 1658           Goals   Nutrition Goal  Eat healthier including a vegetable with each meal  Increase daily kcal intake by ~250kcal/day. Great job for starting to drink Boost high protein drinks! Other ways to add calories include making your homemade milkshakes, drinking milk, eating sources of fats like peanut butter, adding in an extra small meal or snack each day (eats 1 main meal/day currently)      Comment  -  She desires wt gain after losing wt in the hospital. BMI: 18. She would like to gain 10-15# from CBW to IBW 115#      Expected Outcome  Better HEart healthy nutrition that she can afford  Consistently eat 250kcal extra daily either via a nutritional shake like Boost or extra whole food sources        Personal Goal #2 Re-Evaluation   Personal Goal #2  -  Consider drinking water more often, starting with one 8oz glass per day        Personal Goal #3 Re-Evaluation   Personal Goal #3  -  Add fruits into your diet more regularly. If choosing canned, look for varieties in their own 100% juice or light syrup         Nutrition Goals Discharge (Final Nutrition Goals Re-Evaluation): Nutrition Goals Re-Evaluation - 12/01/18 1658  Goals   Nutrition Goal  Increase daily kcal intake by ~250kcal/day. Great job for starting to drink Boost high protein drinks! Other ways to add calories include making your homemade milkshakes, drinking milk, eating sources of fats like peanut butter, adding  in an extra small meal or snack each day (eats 1 main meal/day currently)    Comment  She desires wt gain after losing wt in the hospital. BMI: 18. She would like to gain 10-15# from CBW to IBW 115#    Expected Outcome  Consistently eat 250kcal extra daily either via a nutritional shake like Boost or extra whole food sources      Personal Goal #2 Re-Evaluation   Personal Goal #2  Consider drinking water more often, starting with one 8oz glass per day      Personal Goal #3 Re-Evaluation   Personal Goal #3  Add fruits into your diet more regularly. If choosing canned, look for varieties in their own 100% juice or light syrup       Psychosocial: Target Goals: Acknowledge presence or absence of significant depression and/or stress, maximize coping skills, provide positive support system. Participant is able to verbalize types and ability to use techniques and skills needed for reducing stress and depression.   Initial Review & Psychosocial Screening: Initial Psych Review & Screening - 11/20/18 1507      Initial Review   Current issues with  Current Depression;History of Depression;Current Anxiety/Panic;Current Stress Concerns    Source of Stress Concerns  Financial;Chronic Illness    Comments  Karole has been seeing a therapist for a while but is looking forward to learning more about mental health in our program. She lives alone, on disability. She reports having a support system, but a lot of them blame her smoking on why she needed surgery. 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 She states she was very anxious about coming to orientation but hopes to be able to attend class regularly now that she has met the staff.       Family Dynamics   Good Support System?  Yes   friends, family     Barriers   Psychosocial barriers to participate in program  There are no identifiable barriers or psychosocial needs.;The patient should benefit from  training in stress management and relaxation.      Screening Interventions   Interventions  Encouraged to exercise;Program counselor consult;Provide feedback about the scores to participant;To provide support and resources with identified psychosocial needs    Expected Outcomes  Short Term goal: Utilizing psychosocial counselor, staff and physician to assist with identification of specific Stressors or current issues interfering with healing process. Setting desired goal for each stressor or current issue identified.;Long Term Goal: Stressors or current issues are controlled or eliminated.;Short Term goal: Identification and review with participant of any Quality of Life or Depression concerns found by scoring the questionnaire.;Long Term goal: The participant improves quality of Life and PHQ9 Scores as seen by post scores and/or verbalization of changes       Quality of Life Scores:  Quality of Life - 11/20/18 1219      Quality of Life   Select  Quality of Life      Quality of Life Scores   Health/Function Pre  13 %    Socioeconomic Pre  22.67 %    Psych/Spiritual Pre  12.79 %    Family Pre  14.33 %  GLOBAL Pre  15.09 %      Scores of 19 and below usually indicate a poorer quality of life in these areas.  A difference of  2-3 points is a clinically meaningful difference.  A difference of 2-3 points in the total score of the Quality of Life Index has been associated with significant improvement in overall quality of life, self-image, physical symptoms, and general health in studies assessing change in quality of life.  PHQ-9: Recent Review Flowsheet Data    Depression screen [facility] 2/9 11/20/2018   Decreased Interest 1   Down, Depressed, Hopeless 3   PHQ - 2 Score 4   Altered sleeping 1   Tired, decreased energy 3   Change in appetite 2   Feeling bad or failure about yourself  2   Trouble concentrating 1   Moving slowly or fidgety/restless 0   Suicidal thoughts 0   PHQ-9 Score 13    Difficult doing work/chores Somewhat difficult     Interpretation of Total Score  Total Score Depression Severity:  1-4 = Minimal depression, 5-9 = Mild depression, 10-14 = Moderate depression, 15-19 = Moderately severe depression, 20-27 = Severe depression   Psychosocial Evaluation and Intervention: Psychosocial Evaluation - 12/08/18 1727      Psychosocial Evaluation & Interventions   Interventions  Stress management education;Encouraged to exercise with the program and follow exercise prescription    Comments  Counselor met with Ms. Robert Bellow Clarene [place]) today for initial psychosocial evaluation.  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  She has a limited support system with a friend; her sister in [place] and a hiece.  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  Makaylin sleeps well with the use of medications nightly and she has a good appetite but limited finances to afford good foods.  She has a significant history of depression and anxiety with a bipolar diagnosis 7-8 years ago resulting in her being on disability.  She has just begun a new medication several weeks ago for her mood and can't tell if it is helpful yet or not.  Belma has multiple stressors with her finances; her health conditions and her limited support system.  She has goals to increase her stamina and strength.   Counselor was able to obtain [place] a gas card that can be renewed every 3rd visit she attends this program to help free up some of her finances for food, etc.   She will be followed by staff and counselor.     Expected Outcomes  Short:  Mercedies will come consistently to this program to increase her stamina and strength and to cope with the stress in her life better.  Long:  Flannery will develop a positive self-care program for her health and mental health.     Continue Psychosocial Services   Follow up required by counselor        Psychosocial Re-Evaluation: Psychosocial Re-Evaluation    Moody Name 12/01/18 1542             Psychosocial Re-Evaluation   Current issues with  Current Depression;Current Stress Concerns       Comments  Tazia said she started on a new antidepressant last week. Norleen was given printed info about the Women [facility] Support group and may try to attend tomorrow. Tippi said she has been going to a Clinical cytogeneticist but not sure they have been helping  her lately.. I suggested that she talk to our counselor for any suggestions about other counselors. Sheneika says has lives alone and has anxiety about even when her heart rate goes up.       Expected Outcomes  Stress techniques like pursed lip breathing. Her new antidepressant to help her more.        Comments  Jeananne said she lives on $850/month disability and gets only $60 food stamps. I suggested the [facility] [facility] which serves $4 lunches.          Initial Review   Source of Stress Concerns  Poor Coping Skills;Financial          Psychosocial Discharge (Final Psychosocial Re-Evaluation): Psychosocial Re-Evaluation - 12/01/18 1542      Psychosocial Re-Evaluation   Current issues with  Current Depression;Current Stress Concerns    Comments  Tokiko said she started on a new antidepressant last week. Nygeria was given printed info about the Women [facility] Support group and may try to attend tomorrow. Shekita said she has been going to a Clinical cytogeneticist but not sure they have been helping her lately.. I suggested that she talk to our counselor for any suggestions about other counselors. Ranisha says has lives alone and has anxiety about even when her heart rate goes up.    Expected Outcomes  Stress techniques like pursed lip breathing. Her new antidepressant to help her more.     Comments  Akili said she lives on $850/month disability and gets only $60 food stamps. I suggested the [facility] which serves $4 lunches.       Initial Review   Source of  Stress Concerns  Poor Coping Skills;Financial       Vocational Rehabilitation: Provide vocational rehab assistance to qualifying candidates.   Vocational Rehab Evaluation & Intervention: Vocational Rehab - 11/20/18 1507      Initial Vocational Rehab Evaluation & Intervention   Assessment shows need for Vocational Rehabilitation  No       Education: Education Goals: Education classes will be provided on a variety of topics geared toward better understanding of heart health and risk factor modification. Participant will state understanding/return demonstration of topics presented as noted by education test scores.  Learning Barriers/Preferences: Learning Barriers/Preferences - 11/20/18 1506      Learning Barriers/Preferences   Learning Barriers  None    Learning Preferences  Individual Instruction       Education Topics:  AED/CPR: - Group verbal and written instruction with the use of models to demonstrate the basic use of the AED with the basic ABC's of resuscitation.   General Nutrition Guidelines/Fats and Fiber: -Group instruction provided by verbal, written material, models and posters to present the general guidelines for heart healthy nutrition. Gives an explanation and review of dietary fats and fiber.   Cardiac Rehab from 12/22/2018 in [facility] Cardiac and Pulmonary Rehab  Date  12/01/18  Educator  Lattie Haw RD  Instruction Review Code  1- Verbalizes Understanding      Controlling Sodium/Reading Food Labels: -Group verbal and written material supporting the discussion of sodium use in heart healthy nutrition. Review and explanation with models, verbal and written materials for utilization of the food label.   Exercise Physiology & General Exercise Guidelines: - Group verbal and written instruction with models to review the exercise physiology of the cardiovascular system and associated critical values. Provides general exercise guidelines with specific guidelines to those  with heart or lung disease.    Cardiac Rehab  from 12/22/2018 in [name] Cardiac and Pulmonary Rehab  Date  12/08/18  Educator  [facility]  Instruction Review Code  1- Verbalizes Understanding      Aerobic Exercise & Resistance Training: - Gives group verbal and written instruction on the various components of exercise. Focuses on aerobic and resistive training programs and the benefits of this training and how to safely progress through these programs..   Cardiac Rehab from 12/22/2018 in [facility] Cardiac and Pulmonary Rehab  Date  12/10/18  Educator  AS  Instruction Review Code  1- Verbalizes Understanding      Flexibility, Balance, Mind/Body Relaxation: Provides group verbal/written instruction on the benefits of flexibility and balance training, including mind/body exercise modes such as yoga, pilates and tai chi.  Demonstration and skill practice provided.   Stress and Anxiety: - Provides group verbal and written instruction about the health risks of elevated stress and causes of high stress.  Discuss the correlation between heart/lung disease and anxiety and treatment options. Review healthy ways to manage with stress and anxiety.   Depression: - Provides group verbal and written instruction on the correlation between heart/lung disease and depressed mood, treatment options, and the stigmas associated with seeking treatment.   Cardiac Rehab from 12/22/2018 in [facility] Cardiac and Pulmonary Rehab  Date  11/26/18  Educator  [facility]  Instruction Review Code  1- Verbalizes Understanding      Anatomy & Physiology of the Heart: - Group verbal and written instruction and models provide basic cardiac anatomy and physiology, with the coronary electrical and arterial systems. Review of Valvular disease and Heart Failure   Cardiac Rehab from 12/22/2018 in [facility] Cardiac and Pulmonary Rehab  Date  12/22/18  Educator  CE  Instruction Review Code  1- Verbalizes Understanding      Cardiac Procedures: - Group verbal  and written instruction to review commonly prescribed medications for heart disease. Reviews the medication, class of the drug, and side effects. Includes the steps to properly store meds and maintain the prescription regimen. (beta blockers and nitrates)   Cardiac Medications I: - Group verbal and written instruction to review commonly prescribed medications for heart disease. Reviews the medication, class of the drug, and side effects. Includes the steps to properly store meds and maintain the prescription regimen.   Cardiac Medications II: -Group verbal and written instruction to review commonly prescribed medications for heart disease. Reviews the medication, class of the drug, and side effects. (all other drug classes)    Go Sex-Intimacy & Heart Disease, Get SMART - Goal Setting: - Group verbal and written instruction through game format to discuss heart disease and the return to sexual intimacy. Provides group verbal and written material to discuss and apply goal setting through the application of the S.M.A.R.T. Method.   Other Matters of the Heart: - Provides group verbal, written materials and models to describe Stable Angina and Peripheral Artery. Includes description of the disease process and treatment options available to the cardiac patient.   Exercise & Equipment Safety: - Individual verbal instruction and demonstration of equipment use and safety with use of the equipment.   Cardiac Rehab from 12/22/2018 in [facility] Cardiac and Pulmonary Rehab  Date  11/20/18  Educator  [facility]  Instruction Review Code  1- Verbalizes Understanding      Infection Prevention: - Provides verbal and written material to individual with discussion of infection control including proper hand washing and proper equipment cleaning during exercise session.   Cardiac Rehab from 12/22/2018 in [facility] Cardiac  and Pulmonary Rehab  Date  11/20/18  Educator  [facility]  Instruction Review Code  1- Verbalizes Understanding       Falls Prevention: - Provides verbal and written material to individual with discussion of falls prevention and safety.   Cardiac Rehab from 12/22/2018 in [facility] Cardiac and Pulmonary Rehab  Date  11/20/18  Educator  [facility]  Instruction Review Code  1- Verbalizes Understanding      Diabetes: - Individual verbal and written instruction to review signs/symptoms of diabetes, desired ranges of glucose level fasting, after meals and with exercise. Acknowledge that pre and post exercise glucose checks will be done for 3 sessions at entry of program.   Know Your Numbers and Risk Factors: -Group verbal and written instruction about important numbers in your health.  Discussion of what are risk factors and how they play a role in the disease process.  Review of Cholesterol, Blood Pressure, Diabetes, and BMI and the role they play in your overall health.   Sleep Hygiene: -Provides group verbal and written instruction about how sleep can affect your health.  Define sleep hygiene, discuss sleep cycles and impact of sleep habits. Review good sleep hygiene tips.    Other: -Provides group and verbal instruction on various topics (see comments)   Knowledge Questionnaire Score: Knowledge Questionnaire Score - 11/20/18 1221      Knowledge Questionnaire Score   Pre Score  25/26   correct answers reviewed with Georgian      Core Components/Risk Factors/Patient Goals at Admission: Personal Goals and Risk Factors at Admission - 11/20/18 1505      Core Components/Risk Factors/Patient Goals on Admission    Weight Management  Yes;Weight Gain    Intervention  Weight Management: Develop a combined nutrition and exercise program designed to reach desired caloric intake, while maintaining appropriate intake of nutrient and fiber, sodium and fats, and appropriate energy expenditure required for the weight goal.;Weight Management: Provide education and appropriate resources to help participant work on and attain  dietary goals.    Admit Weight  101 lb 14.4 oz (46.2 kg)    Goal Weight: Short Term  104 lb (47.2 kg)    Goal Weight: Long Term  115 lb (52.2 kg)    Expected Outcomes  Short Term: Continue to assess and modify interventions until short term weight is achieved;Long Term: Adherence to nutrition and physical activity/exercise program aimed toward attainment of established weight goal;Understanding recommendations for meals to include 15-35% energy as protein, 25-35% energy from fat, 35-60% energy from carbohydrates, less than 283m of dietary cholesterol, 20-35 gm of total fiber daily;Understanding of distribution of calorie intake throughout the day with the consumption of 4-5 meals/snacks;Weight Gain: Understanding of general recommendations for a high calorie, high protein meal plan that promotes weight gain by distributing calorie intake throughout the day with the consumption for 4-5 meals, snacks, and/or supplements    Tobacco Cessation  Yes    Intervention  Assist the participant in steps to quit. Provide individualized education and counseling about committing to Tobacco Cessation, relapse prevention, and pharmacological support that can be provided by physician.;OAdvice worker assist with locating and accessing local/national Quit Smoking programs, and support quit date choice.    Expected Outcomes  Short Term: Will demonstrate readiness to quit, by selecting a quit date.;Short Term: Will quit all tobacco product use, adhering to prevention of relapse plan.;Long Term: Complete abstinence from all tobacco products for at least 12 months from quit date.    Lipids  Yes    Intervention  Provide education and support for participant on nutrition & aerobic/resistive exercise along with prescribed medications to achieve LDL <68m, HDL >440m    Expected Outcomes  Short Term: Participant states understanding of desired cholesterol values and is compliant with medications prescribed.  Participant is following exercise prescription and nutrition guidelines.;Long Term: Cholesterol controlled with medications as prescribed, with individualized exercise RX and with personalized nutrition plan. Value goals: LDL < 7022mHDL > 40 mg.       Core Components/Risk Factors/Patient Goals Review:  Goals and Risk Factor Review    Row Name 12/01/18 1546 12/01/18 1747           Core Components/Risk Factors/Patient Goals Review   Personal Goals Review  Weight Management/Obesity;Tobacco Cessation;Lipids  Tobacco Cessation      Review  TarTrenityid she is trying to gain weight and knows she wants to eat healthier. TarKenedynt to the heart doctor today and she reports he was happy with her initial progress.   TarJeniyahports that she quit smoking the day of her heart attack but "still struggles but I know I need to stay quit and not smoke".      Expected Outcomes  Heart healthy living including gaining weight so her BMI is more and she has more reserve.  Cont to not smoke cigarettes.         Core Components/Risk Factors/Patient Goals at Discharge (Final Review):  Goals and Risk Factor Review - 12/01/18 1747      Core Components/Risk Factors/Patient Goals Review   Personal Goals Review  Tobacco Cessation    Review  TarPashaports that she quit smoking the day of her heart attack but "still struggles but I know I need to stay quit and not smoke".    Expected Outcomes  Cont to not smoke cigarettes.       ITP Comments: ITP Comments    Row Name 11/20/18 1458 12/17/18 1420 01/14/19 0650       ITP Comments  Med Review completed. Initial ITP created. [facility]  30 day review completed. ITP sent to Dr. MarEmily Filbertedical Director of Cardiac Rehab. Continue with ITP unless changes are made by physician.  30 day review. Continue with ITP unless directed changes by Medical Director chart review.        Comments:

## 2019-01-15 DIAGNOSIS — I2510 Atherosclerotic heart disease of native coronary artery without angina pectoris: Secondary | ICD-10-CM | POA: Diagnosis not present

## 2019-01-15 DIAGNOSIS — I10 Essential (primary) hypertension: Secondary | ICD-10-CM | POA: Diagnosis not present

## 2019-01-19 ENCOUNTER — Other Ambulatory Visit: Payer: Self-pay | Admitting: Family

## 2019-01-19 DIAGNOSIS — Z1231 Encounter for screening mammogram for malignant neoplasm of breast: Secondary | ICD-10-CM

## 2019-01-19 DIAGNOSIS — I10 Essential (primary) hypertension: Secondary | ICD-10-CM | POA: Diagnosis not present

## 2019-01-19 DIAGNOSIS — E782 Mixed hyperlipidemia: Secondary | ICD-10-CM | POA: Diagnosis not present

## 2019-01-19 DIAGNOSIS — I2510 Atherosclerotic heart disease of native coronary artery without angina pectoris: Secondary | ICD-10-CM | POA: Diagnosis not present

## 2019-01-21 ENCOUNTER — Telehealth: Payer: Self-pay

## 2019-01-21 ENCOUNTER — Other Ambulatory Visit: Payer: Self-pay | Admitting: *Deleted

## 2019-01-21 DIAGNOSIS — R918 Other nonspecific abnormal finding of lung field: Secondary | ICD-10-CM

## 2019-01-21 DIAGNOSIS — Z122 Encounter for screening for malignant neoplasm of respiratory organs: Secondary | ICD-10-CM

## 2019-01-21 DIAGNOSIS — Z87891 Personal history of nicotine dependence: Secondary | ICD-10-CM

## 2019-01-21 NOTE — Telephone Encounter (Signed)
LMOM

## 2019-01-22 ENCOUNTER — Encounter: Payer: Self-pay | Admitting: *Deleted

## 2019-01-30 DIAGNOSIS — R0602 Shortness of breath: Secondary | ICD-10-CM | POA: Diagnosis not present

## 2019-01-30 DIAGNOSIS — I10 Essential (primary) hypertension: Secondary | ICD-10-CM | POA: Diagnosis not present

## 2019-01-30 DIAGNOSIS — E782 Mixed hyperlipidemia: Secondary | ICD-10-CM | POA: Diagnosis not present

## 2019-01-30 DIAGNOSIS — I2510 Atherosclerotic heart disease of native coronary artery without angina pectoris: Secondary | ICD-10-CM | POA: Diagnosis not present

## 2019-01-31 DIAGNOSIS — Z0100 Encounter for examination of eyes and vision without abnormal findings: Secondary | ICD-10-CM | POA: Diagnosis not present

## 2019-02-02 ENCOUNTER — Telehealth (INDEPENDENT_AMBULATORY_CARE_PROVIDER_SITE_OTHER): Payer: Self-pay | Admitting: Vascular Surgery

## 2019-02-02 NOTE — Telephone Encounter (Signed)
I'm not certain that in this instance an office visit would result in resolution of her symptoms.  She does have carotid stenosis but at last visit it was only in the 40-50%, which would not cause dizzy spells or foggy headed issues.  However, I did not that she has extensive cardiovascular history, which can cause these symptoms.  I would suggest contacting her cardiologist and PCP first about these issues.    If the patient is still insistent after speaking to her, bring her in for a carotid duplex with Dew.

## 2019-02-02 NOTE — Telephone Encounter (Signed)
Patient inform that she saw her cardiologist on Friday and the doctor advise her to contact our office

## 2019-02-03 NOTE — Telephone Encounter (Signed)
I see she has an appointment 4/3.  We can see if we can move that up , depending on ultrasound schedule

## 2019-02-05 ENCOUNTER — Ambulatory Visit: Admission: RE | Admit: 2019-02-05 | Payer: Medicare HMO | Source: Ambulatory Visit

## 2019-02-05 ENCOUNTER — Telehealth: Payer: Self-pay | Admitting: *Deleted

## 2019-02-05 ENCOUNTER — Other Ambulatory Visit: Payer: Self-pay

## 2019-02-05 ENCOUNTER — Ambulatory Visit
Admission: RE | Admit: 2019-02-05 | Discharge: 2019-02-05 | Disposition: A | Discharge status: Home / Self Care | Payer: Medicare HMO | Source: Ambulatory Visit | Attending: Oncology | Admitting: Oncology

## 2019-02-05 ENCOUNTER — Ambulatory Visit: Payer: Medicare HMO

## 2019-02-05 ENCOUNTER — Encounter: Payer: Self-pay | Admitting: *Deleted

## 2019-02-05 DIAGNOSIS — Z87891 Personal history of nicotine dependence: Secondary | ICD-10-CM | POA: Insufficient documentation

## 2019-02-05 DIAGNOSIS — R918 Other nonspecific abnormal finding of lung field: Secondary | ICD-10-CM | POA: Diagnosis not present

## 2019-02-05 DIAGNOSIS — Z122 Encounter for screening for malignant neoplasm of respiratory organs: Secondary | ICD-10-CM | POA: Diagnosis not present

## 2019-02-05 DIAGNOSIS — Z136 Encounter for screening for cardiovascular disorders: Secondary | ICD-10-CM | POA: Diagnosis not present

## 2019-02-05 NOTE — Telephone Encounter (Signed)
Notified patient of LDCT lung cancer screening program results with recommendation for 12 month follow up imaging. Also notified of incidental findings noted below and is encouraged to discuss further with PCP who will receive a copy of this note and/or the CT report. Patient verbalizes understanding.   IMPRESSION: 1. Lung-RADS 2, benign appearance or behavior. The part solid peripheral right lower lobe nodular opacity seen previously has almost completely resolved in the interval suggesting infectious/inflammatory etiology. Continue annual screening with low-dose chest CT without contrast in 12 months. 2.  Emphysema. (ICD10-J43.9) 3.  Aortic Atherosclerois (ICD10-170.0)

## 2019-02-06 ENCOUNTER — Encounter: Payer: Self-pay | Admitting: *Deleted

## 2019-02-20 ENCOUNTER — Other Ambulatory Visit: Payer: Self-pay

## 2019-02-20 ENCOUNTER — Ambulatory Visit (INDEPENDENT_AMBULATORY_CARE_PROVIDER_SITE_OTHER): Payer: Medicare HMO | Admitting: Vascular Surgery

## 2019-02-20 ENCOUNTER — Ambulatory Visit (INDEPENDENT_AMBULATORY_CARE_PROVIDER_SITE_OTHER): Payer: Medicare HMO

## 2019-02-20 ENCOUNTER — Encounter (INDEPENDENT_AMBULATORY_CARE_PROVIDER_SITE_OTHER): Payer: Self-pay | Admitting: Vascular Surgery

## 2019-02-20 VITALS — BP 120/78 | HR 83 | Resp 16 | Ht 62.0 in | Wt 106.6 lb

## 2019-02-20 DIAGNOSIS — I10 Essential (primary) hypertension: Secondary | ICD-10-CM

## 2019-02-20 DIAGNOSIS — E785 Hyperlipidemia, unspecified: Secondary | ICD-10-CM | POA: Diagnosis not present

## 2019-02-20 DIAGNOSIS — I6523 Occlusion and stenosis of bilateral carotid arteries: Secondary | ICD-10-CM | POA: Diagnosis not present

## 2019-02-20 DIAGNOSIS — I714 Abdominal aortic aneurysm, without rupture, unspecified: Secondary | ICD-10-CM

## 2019-02-20 DIAGNOSIS — F17210 Nicotine dependence, cigarettes, uncomplicated: Secondary | ICD-10-CM

## 2019-02-20 DIAGNOSIS — R69 Illness, unspecified: Secondary | ICD-10-CM | POA: Diagnosis not present

## 2019-02-20 DIAGNOSIS — Z79899 Other long term (current) drug therapy: Secondary | ICD-10-CM

## 2019-02-20 NOTE — Assessment & Plan Note (Signed)
blood pressure control important in reducing the progression of atherosclerotic disease. On appropriate oral medications.  

## 2019-02-20 NOTE — Assessment & Plan Note (Signed)
Duplex today shows velocities in the 1 to 39% range bilaterally.  This would be very unlikely to be the cause of her dizziness and presyncopal episodes.  No changes in her medical regimen from my point of view at this time.  This can be rechecked in 1 year.

## 2019-02-20 NOTE — Progress Notes (Signed)
MRN : [id number]  Melissa Montes is a 63 y.o. (10-21-1956) female who presents with chief complaint of  Chief Complaint  Patient presents with   Follow-up    ultrasound follow up  .  History of Present Illness: Patient returns today in follow-up of her carotid disease earlier than her scheduled visit.  She has been having worsening dizziness and presyncopal episodes.  Since her last visit with me, she has undergone coronary artery bypass grafting for significant coronary disease.  She says she is still having these episodes.  No arm or leg weakness or numbness.  No speech or swallowing difficulty.  No visual symptoms. Duplex today shows velocities in the 1 to 39% range bilaterally.  Current Outpatient Medications  Medication Sig Dispense Refill   acetaminophen (TYLENOL) 325 MG tablet Take 325-650 mg by mouth every 6 (six) hours as needed (for pain.).     aspirin EC 81 MG tablet Take 162 mg by mouth every evening.      atorvastatin (LIPITOR) 80 MG tablet Take 80 mg by mouth every evening.      b complex vitamins tablet Take 1 tablet by mouth daily.     baclofen (LIORESAL) 10 MG tablet Take 10 mg by mouth daily as needed for muscle spasms.     Calcium Carbonate-Vitamin D (CALTRATE 600+D PO) Take 600 mg by mouth 2 (two) times daily.      diazepam (VALIUM) 10 MG tablet Take 10 mg by mouth daily as needed for anxiety.     ezetimibe (ZETIA) 10 MG tablet Take 10 mg by mouth daily.      metoprolol succinate (TOPROL-XL) 25 MG 24 hr tablet Take 25 mg by mouth daily.      Multiple Vitamin (MULTIVITAMIN) tablet Take 1 tablet by mouth daily. Centrum Silver for Women     nitroGLYCERIN (NITROSTAT) 0.4 MG SL tablet Place 0.4 mg under the tongue every 5 (five) minutes x 3 doses as needed for chest pain.      ondansetron (ZOFRAN) 4 MG tablet Take 1 tablet by mouth 4 (four) times daily.     traZODone (DESYREL) 100 MG tablet Take 100 mg by mouth at bedtime.      gabapentin (NEURONTIN)  100 MG capsule Take 1 capsule by mouth daily.  2   gabapentin (NEURONTIN) 100 MG capsule Take by mouth.     No current facility-administered medications for this visit.     Past Medical History:  Diagnosis Date   Anxiety    Bipolar disorder ([place])    Depression    Heart disease    Hyperlipidemia    MI (myocardial infarction) ([place])     Past Surgical History:  Procedure Laterality Date   ABDOMINAL HYSTERECTOMY     CAROTID ANGIOGRAPHY N/A 01/13/2018   Procedure: CAROTID ANGIOGRAPHY;  Surgeon: Algernon Huxley, MD;  Location: [facility];  Service: Cardiovascular;  Laterality: N/A;   CORONARY ANGIOPLASTY WITH STENT PLACEMENT     LEFT HEART CATH Right 07/07/2018   Procedure: Left Heart Cath and Coronary Angiography;  Surgeon: Dionisio David, MD;  Location: [facility];  Service: Cardiovascular;  Laterality: Right;   Social History        Tobacco Use   Smoking status: Current Every Day Smoker    Packs/day: 0.50    Types: Cigarettes   Smokeless tobacco: Never Used  Substance Use Topics   Alcohol use: No   Drug use: No  Family History      Family History  Problem Relation Age of Onset   Breast cancer Mother 34   Breast cancer Maternal Aunt        mat aunt and great aunt         Allergies  Allergen Reactions   Keflex [Cephalexin] Hives   REVIEW OF SYSTEMS(Negative unless checked)  Constitutional: [] ?Weight loss[] ?Fever[] ?Chills Cardiac:[] ?Chest pain[] ?Chest pressure[x] ?Palpitations [] ?Shortness of breath when laying flat [] ?Shortness of breath at rest [x] ?Shortness of breath with exertion. Vascular: [] ?Pain in legs with walking[] ?Pain in legsat rest[] ?Pain in legs when laying flat [x] ?Claudication [] ?Pain in feet when walking [] ?Pain in feet at rest [] ?Pain in feet when laying flat [] ?History of DVT [] ?Phlebitis [] ?Swelling in legs [] ?Varicose veins [] ?Non-healing  ulcers Pulmonary: [] ?Uses home oxygen [] ?Productive cough[] ?Hemoptysis [] ?Wheeze [] ?COPD [] ?Asthma Neurologic: [x] ?Dizziness [] ?Blackouts [] ?Seizures [] ?History of stroke [] ?History of TIA[] ?Aphasia [] ?Temporary blindness[] ?Dysphagia [] ?Weaknessor numbness in arms [] ?Weakness or numbnessin legs Musculoskeletal: [] ?Arthritis [] ?Joint swelling [] ?Joint pain [] ?Low back pain Hematologic:[] ?Easy bruising[] ?Easy bleeding [] ?Hypercoagulable state [] ?Anemic  Gastrointestinal:[] ?Blood in stool[] ?Vomiting blood[x] ?Gastroesophageal reflux/heartburn[] ?Abdominal pain Genitourinary: [] ?Chronic kidney disease [] ?Difficulturination [] ?Frequenturination [] ?Burning with urination[] ?Hematuria Skin: [] ?Rashes [] ?Ulcers [] ?Wounds Psychological: [x] ?History of anxiety[x] ?History of major depression.     Physical Examination  Vitals:   02/20/19 1104  BP: 120/78  Pulse: 83  Resp: 16  Weight: 106 lb 9.6 oz (48.4 kg)  Height: 5\' 2"  (1.575 m)   Body mass index is 19.5 kg/m. Gen:  WD/WN, NAD Head: [place]/AT, No temporalis wasting. Ear/Nose/Throat: Hearing grossly intact, nares w/o erythema or drainage, trachea midline Eyes: Conjunctiva clear. Sclera non-icteric Neck: Supple. Trachea midline Pulmonary:  Good air movement, respirations not labored Cardiac: RRR, No JVD Vascular:  Vessel Right Left  Radial Palpable Palpable               Musculoskeletal: M/S 5/5 throughout.  No deformity or atrophy. No edema. Neurologic: CN 2-12 intact. Sensation grossly intact in extremities.  Symmetrical.  Speech is fluent. Motor exam as listed above. Psychiatric: Judgment intact, Mood & affect appropriate for pt's clinical situation. Dermatologic: No rashes or ulcers noted.  No cellulitis or open wounds.      CBC Lab Results  Component Value Date   WBC 10.1 07/07/2018   HGB 14.0 07/07/2018   HCT 39.4 07/07/2018   MCV 89.6  07/07/2018   PLT 261 07/07/2018    BMET    Component Value Date/Time   NA 140 07/07/2018 1051   K 4.3 07/07/2018 1051   CL 106 07/07/2018 1051   CO2 26 07/07/2018 1051   GLUCOSE 108 (H) 07/07/2018 1051   BUN 15 07/07/2018 1051   CREATININE 0.74 07/07/2018 1051   CALCIUM 9.0 07/07/2018 1051   GFRNONAA >60 07/07/2018 1051   GFRAA >60 07/07/2018 1051   CrCl cannot be calculated (Patient's most recent lab result is older than the maximum 21 days allowed.).  COAG No results found for: INR, PROTIME  Radiology Ct Chest Lcs Nodule F/u W/o Contrast  Result Date: 02/05/2019 CLINICAL DATA:  63 year old female with 41 pack-year history of smoking. Lung cancer screening. EXAM: CT CHEST WITHOUT CONTRAST FOR LUNG CANCER SCREENING NODULE FOLLOW-UP TECHNIQUE: Multidetector CT imaging of the chest was performed following the standard protocol without IV contrast. COMPARISON:  11/04/2018 FINDINGS: Cardiovascular: The heart size is normal. No substantial pericardial effusion. Coronary artery calcification is evident. Atherosclerotic calcification is noted in the wall of the thoracic aorta. Mediastinum/Nodes: No mediastinal lymphadenopathy. No evidence for gross hilar lymphadenopathy although assessment is limited by the lack of intravenous  contrast on today's study. The esophagus has normal imaging features. There is no axillary lymphadenopathy. Lungs/Pleura: Centrilobular and paraseptal emphysema evident. Bullous change noted in the lung apices. The central tracheobronchial airways are patent. The part solid peripheral right lower lobe opacity seen on the previous study has almost completely resolved in the interval. The only residual nodular component on today's study measures about 3 mm. Tiny right middle lobe nodule identified previously is stable. No new suspicious nodule or mass. The no pleural effusion. Upper Abdomen: Stable 7 mm hypodensity in the lateral right liver. Small calcified stones noted in  the gallbladder, incompletely visualized. There is abdominal aortic atherosclerosis without aneurysm. Musculoskeletal: No worrisome lytic or sclerotic osseous abnormality. IMPRESSION: 1. Lung-RADS 2, benign appearance or behavior. The part solid peripheral right lower lobe nodular opacity seen previously has almost completely resolved in the interval suggesting infectious/inflammatory etiology. Continue annual screening with low-dose chest CT without contrast in 12 months. 2.  Emphysema. (ICD10-J43.9) 3.  Aortic Atherosclerois (ICD10-170.0) Electronically Signed   By: Misty Stanley M.D.   On: 02/05/2019 12:55     Assessment/Plan Hyperlipemia lipid control important in reducing the progression of atherosclerotic disease. Continue statin therapy   AAA (abdominal aortic aneurysm) without rupture (HCC) 3.4 cm in maximal diameter at last check.  Some associated iliac artery stenosis but her claudication symptoms are mild.  We will continue to recheck on an annual basis which is scheduled for later this year.  Benign essential hypertension blood pressure control important in reducing the progression of atherosclerotic disease. On appropriate oral medications.   Bilateral carotid artery stenosis Duplex today shows velocities in the 1 to 39% range bilaterally.  This would be very unlikely to be the cause of her dizziness and presyncopal episodes.  No changes in her medical regimen from my point of view at this time.  This can be rechecked in 1 year.    Leotis Pain, MD  02/20/2019 11:27 AM    This note was created with Dragon medical transcription system.  Any errors from dictation are purely unintentional

## 2019-03-30 ENCOUNTER — Other Ambulatory Visit: Payer: Self-pay

## 2019-03-31 ENCOUNTER — Ambulatory Visit
Admission: RE | Admit: 2019-03-31 | Discharge: 2019-03-31 | Disposition: A | Discharge status: Home / Self Care | Payer: Medicare HMO | Source: Ambulatory Visit | Attending: Internal Medicine | Admitting: Internal Medicine

## 2019-03-31 ENCOUNTER — Other Ambulatory Visit: Payer: Self-pay | Admitting: Internal Medicine

## 2019-03-31 ENCOUNTER — Other Ambulatory Visit: Payer: Self-pay

## 2019-03-31 ENCOUNTER — Inpatient Hospital Stay: Payer: Medicare HMO | Attending: Internal Medicine

## 2019-03-31 ENCOUNTER — Inpatient Hospital Stay: Payer: Medicare HMO

## 2019-03-31 DIAGNOSIS — D472 Monoclonal gammopathy: Secondary | ICD-10-CM | POA: Insufficient documentation

## 2019-03-31 DIAGNOSIS — C9000 Multiple myeloma not having achieved remission: Secondary | ICD-10-CM | POA: Diagnosis not present

## 2019-03-31 LAB — COMPREHENSIVE METABOLIC PANEL
ALT: 21 U/L (ref 0–44)
AST: 25 U/L (ref 15–41)
Albumin: 4.2 g/dL (ref 3.5–5.0)
Alkaline Phosphatase: 101 U/L (ref 38–126)
Anion gap: 8 (ref 5–15)
BUN: 22 mg/dL (ref 8–23)
CO2: 28 mmol/L (ref 22–32)
Calcium: 9.5 mg/dL (ref 8.9–10.3)
Chloride: 104 mmol/L (ref 98–111)
Creatinine, Ser: 0.67 mg/dL (ref 0.44–1.00)
GFR calc Af Amer: >60 mL/min (ref >60)
GFR calc non Af Amer: >60 mL/min (ref >60)
Glucose, Bld: 109 mg/dL — ABNORMAL HIGH (ref 70–99)
Potassium: 4.8 mmol/L (ref 3.5–5.1)
Sodium: 140 mmol/L (ref 135–145)
Total Bilirubin: 0.5 mg/dL (ref 0.3–1.2)
Total Protein: 7.3 g/dL (ref 6.5–8.1)

## 2019-03-31 LAB — CBC WITH DIFFERENTIAL/PLATELET
Abs Immature Granulocytes: 0.04 10*3/uL (ref 0.00–0.07)
Basophils Absolute: 0.1 10*3/uL (ref 0.0–0.1)
Basophils Relative: 1 %
Eosinophils Absolute: 0.6 10*3/uL — ABNORMAL HIGH (ref 0.0–0.5)
Eosinophils Relative: 6 %
HCT: 41.2 % (ref 36.0–46.0)
Hemoglobin: 13.9 g/dL (ref 12.0–15.0)
Immature Granulocytes: 0 %
Lymphocytes Relative: 20 %
Lymphs Abs: 2.2 10*3/uL (ref 0.7–4.0)
MCH: 30.3 pg (ref 26.0–34.0)
MCHC: 33.7 g/dL (ref 30.0–36.0)
MCV: 89.8 fL (ref 80.0–100.0)
Monocytes Absolute: 1.1 10*3/uL — ABNORMAL HIGH (ref 0.1–1.0)
Monocytes Relative: 10 %
Neutro Abs: 7 10*3/uL (ref 1.7–7.7)
Neutrophils Relative %: 63 %
Platelets: 234 10*3/uL (ref 150–400)
RBC: 4.59 MIL/uL (ref 3.87–5.11)
RDW: 12.5 % (ref 11.5–15.5)
WBC: 11 10*3/uL — ABNORMAL HIGH (ref 4.0–10.5)
nRBC: 0 % (ref 0.0–0.2)

## 2019-04-01 LAB — MULTIPLE MYELOMA PANEL, SERUM
Albumin SerPl Elph-Mcnc: 3.9 g/dL (ref 2.9–4.4)
Albumin/Glob SerPl: 1.4 (ref 0.7–1.7)
Alpha 1: 0.3 g/dL (ref 0.0–0.4)
Alpha2 Glob SerPl Elph-Mcnc: 0.9 g/dL (ref 0.4–1.0)
B-Globulin SerPl Elph-Mcnc: 0.9 g/dL (ref 0.7–1.3)
Gamma Glob SerPl Elph-Mcnc: 0.7 g/dL (ref 0.4–1.8)
Globulin, Total: 2.8 g/dL (ref 2.2–3.9)
IgA: 170 mg/dL (ref 87–352)
IgG (Immunoglobin G), Serum: 780 mg/dL (ref 586–1602)
IgM (Immunoglobulin M), Srm: 80 mg/dL (ref 26–217)
Total Protein ELP: 6.7 g/dL (ref 6.0–8.5)

## 2019-04-01 LAB — KAPPA/LAMBDA LIGHT CHAINS
Kappa free light chain: 14.9 mg/L (ref 3.3–19.4)
Kappa, lambda light chain ratio: 1.25 (ref 0.26–1.65)
Lambda free light chains: 11.9 mg/L (ref 5.7–26.3)

## 2019-04-03 ENCOUNTER — Telehealth: Payer: Self-pay | Admitting: Internal Medicine

## 2019-04-03 ENCOUNTER — Other Ambulatory Visit: Payer: Medicare HMO

## 2019-04-06 ENCOUNTER — Inpatient Hospital Stay (HOSPITAL_BASED_OUTPATIENT_CLINIC_OR_DEPARTMENT_OTHER): Payer: Medicare HMO | Admitting: Internal Medicine

## 2019-04-06 ENCOUNTER — Other Ambulatory Visit: Payer: Self-pay

## 2019-04-06 ENCOUNTER — Encounter: Payer: Self-pay | Admitting: Internal Medicine

## 2019-04-06 DIAGNOSIS — D472 Monoclonal gammopathy: Secondary | ICD-10-CM | POA: Diagnosis not present

## 2019-04-06 NOTE — Progress Notes (Signed)
I connected with Melissa Montes on 04/06/2019 at  2:00 PM EDT by telephone visit and verified that I am speaking with the correct person using two identifiers.  I discussed the limitations, risks, security and privacy concerns of performing an evaluation and management service by telemedicine and the availability of in-person appointments. I also discussed with the patient that there may be a patient responsible charge related to this service. The patient expressed understanding and agreed to proceed.    Other persons participating in the visit and their role in the encounter: none  Patient's location: home  Provider's location: home    No history exists.     Chief Complaint: MGUS   History of present illness:Melissa Montes [birth year] 63 y.o.  female with history of MGUS is here for follow-up.  Patient denies any shortness of breath or cough.  Denies any bone pain.  Observation/objective:  Assessment and plan: MGUS (monoclonal gammopathy of unknown significance) #[incidental-nov 2019] Immunofixation shows IgG monoclonal protein with kappa light chain; Specificity- Not quantified.  May 2020 repeat myeloma work-up negative for any M protein/kappa lambda light chain ratio normal.  Bone survey negative for any active lesions.  #Had a long discussion with patient regarding the discrepancy of the tests; however very reassuring that in the absence of other organ dysfunction anemia or renal insufficiency-would not recommend a bone marrow biopsy. My concerns for clinical progression to multiple myeloma is extremely low.  We will follow-up again in 12 months.    # Smoker/lung cancer screening-quit smoking; lung cancer screening program.  # DISPOSITION: # Follow up in 12  months/MD; labs- cbc/cmp/MM panel; k/l light chains- 1 week prior- Dr.B     Follow-up instructions:  I discussed the assessment and treatment plan with the patient.  The patient was provided an opportunity to ask questions  and all were answered.  The patient agreed with the plan and demonstrated understanding of instructions.  The patient was advised to call back or seek an in person evaluation if the symptoms worsen or if the condition fails to improve as anticipated.  I provided 12 minutes of non face-to-face telephone visit time during this encounter, and > 50% was spent counseling as documented under my assessment & plan.   Dr. Charlaine Dalton [place] at [facility] 04/10/2019 7:49 AM

## 2019-04-06 NOTE — Assessment & Plan Note (Addendum)
#[  incidental-nov 2019] Immunofixation shows IgG monoclonal protein with kappa light chain; Specificity- Not quantified.  May 2020 repeat myeloma work-up negative for any M protein/kappa lambda light chain ratio normal.  Bone survey negative for any active lesions.  #Had a long discussion with patient regarding the discrepancy of the tests; however very reassuring that in the absence of other organ dysfunction anemia or renal insufficiency-would not recommend a bone marrow biopsy. My concerns for clinical progression to multiple myeloma is extremely low.  We will follow-up again in 12 months.    # Smoker/lung cancer screening-quit smoking; lung cancer screening program.  # DISPOSITION: # Follow up in 12  months/MD; labs- cbc/cmp/MM panel; k/l light chains- 1 week prior- Dr.B

## 2019-04-10 ENCOUNTER — Other Ambulatory Visit: Payer: Self-pay | Admitting: *Deleted

## 2019-04-10 DIAGNOSIS — D472 Monoclonal gammopathy: Secondary | ICD-10-CM

## 2019-04-14 ENCOUNTER — Other Ambulatory Visit: Payer: Self-pay | Admitting: Orthopedic Surgery

## 2019-04-14 DIAGNOSIS — M5441 Lumbago with sciatica, right side: Secondary | ICD-10-CM

## 2019-04-14 DIAGNOSIS — M5442 Lumbago with sciatica, left side: Secondary | ICD-10-CM

## 2019-04-14 DIAGNOSIS — E119 Type 2 diabetes mellitus without complications: Secondary | ICD-10-CM | POA: Diagnosis not present

## 2019-04-14 DIAGNOSIS — M25551 Pain in right hip: Secondary | ICD-10-CM | POA: Diagnosis not present

## 2019-04-14 DIAGNOSIS — M25552 Pain in left hip: Secondary | ICD-10-CM | POA: Diagnosis not present

## 2019-04-14 DIAGNOSIS — M7062 Trochanteric bursitis, left hip: Secondary | ICD-10-CM | POA: Diagnosis not present

## 2019-04-14 DIAGNOSIS — G8929 Other chronic pain: Secondary | ICD-10-CM | POA: Diagnosis not present

## 2019-04-14 DIAGNOSIS — M7061 Trochanteric bursitis, right hip: Secondary | ICD-10-CM | POA: Diagnosis not present

## 2019-04-16 DIAGNOSIS — I2510 Atherosclerotic heart disease of native coronary artery without angina pectoris: Secondary | ICD-10-CM | POA: Diagnosis not present

## 2019-04-16 DIAGNOSIS — I10 Essential (primary) hypertension: Secondary | ICD-10-CM | POA: Diagnosis not present

## 2019-04-29 ENCOUNTER — Other Ambulatory Visit: Payer: Self-pay

## 2019-04-29 ENCOUNTER — Ambulatory Visit
Admission: RE | Admit: 2019-04-29 | Discharge: 2019-04-29 | Disposition: A | Discharge status: Home / Self Care | Payer: Medicare HMO | Source: Ambulatory Visit | Attending: Orthopedic Surgery | Admitting: Orthopedic Surgery

## 2019-04-29 DIAGNOSIS — M5441 Lumbago with sciatica, right side: Secondary | ICD-10-CM | POA: Insufficient documentation

## 2019-04-29 DIAGNOSIS — M48061 Spinal stenosis, lumbar region without neurogenic claudication: Secondary | ICD-10-CM | POA: Diagnosis not present

## 2019-04-29 DIAGNOSIS — M5125 Other intervertebral disc displacement, thoracolumbar region: Secondary | ICD-10-CM | POA: Diagnosis not present

## 2019-04-29 DIAGNOSIS — M5442 Lumbago with sciatica, left side: Secondary | ICD-10-CM | POA: Diagnosis not present

## 2019-04-30 ENCOUNTER — Telehealth (INDEPENDENT_AMBULATORY_CARE_PROVIDER_SITE_OTHER): Payer: Self-pay | Admitting: Nurse Practitioner

## 2019-04-30 NOTE — Telephone Encounter (Signed)
This aneurysm isn't new to the patient.  She had an ultrasound on 06/24/2018 and her aneurysm measured 3.4 cm.  Her appointment in august is to rescan her to check the size of her aneurysm.  Typically we don't repair aneurysms until they reach at least 5 cm or they grow more than 1 cm in the course of a year.  I believe that they best course will be to wait until August so that way we can get an accurate measurement of her aneurysm over the span of a year, also comparing ultrasound to ultrasound.  Therefore, a sooner appointment is not necessary as this time as it will not change the course of treatment, which will be to continue to monitor until it reaches 5 cm in size.

## 2019-04-30 NOTE — Telephone Encounter (Signed)
Patient called stating MRI done yesterday for back pain. Radiologist advised patient her MRI showed abdominal aneurysm at 4cm and that she should call us. Patient has follow up with [place] in August but would like to be seen sooner. Please advise. AS, CMA

## 2019-04-30 NOTE — Telephone Encounter (Signed)
Patient is aware of the below and verbalized understanding. AS, CMA 

## 2019-05-04 DIAGNOSIS — M940 Chondrocostal junction syndrome [Tietze]: Secondary | ICD-10-CM | POA: Diagnosis not present

## 2019-05-04 DIAGNOSIS — I25810 Atherosclerosis of coronary artery bypass graft(s) without angina pectoris: Secondary | ICD-10-CM | POA: Diagnosis not present

## 2019-05-04 DIAGNOSIS — E782 Mixed hyperlipidemia: Secondary | ICD-10-CM | POA: Diagnosis not present

## 2019-05-04 DIAGNOSIS — I2510 Atherosclerotic heart disease of native coronary artery without angina pectoris: Secondary | ICD-10-CM | POA: Diagnosis not present

## 2019-05-04 DIAGNOSIS — R0602 Shortness of breath: Secondary | ICD-10-CM | POA: Diagnosis not present

## 2019-05-04 DIAGNOSIS — I10 Essential (primary) hypertension: Secondary | ICD-10-CM | POA: Diagnosis not present

## 2019-05-06 DIAGNOSIS — M47816 Spondylosis without myelopathy or radiculopathy, lumbar region: Secondary | ICD-10-CM | POA: Diagnosis not present

## 2019-05-06 DIAGNOSIS — M5442 Lumbago with sciatica, left side: Secondary | ICD-10-CM | POA: Diagnosis not present

## 2019-05-06 DIAGNOSIS — M25551 Pain in right hip: Secondary | ICD-10-CM | POA: Diagnosis not present

## 2019-05-06 DIAGNOSIS — M5126 Other intervertebral disc displacement, lumbar region: Secondary | ICD-10-CM | POA: Diagnosis not present

## 2019-05-06 DIAGNOSIS — M5416 Radiculopathy, lumbar region: Secondary | ICD-10-CM | POA: Diagnosis not present

## 2019-05-06 DIAGNOSIS — G8929 Other chronic pain: Secondary | ICD-10-CM | POA: Diagnosis not present

## 2019-05-06 DIAGNOSIS — M5441 Lumbago with sciatica, right side: Secondary | ICD-10-CM | POA: Diagnosis not present

## 2019-05-09 DIAGNOSIS — I6523 Occlusion and stenosis of bilateral carotid arteries: Secondary | ICD-10-CM | POA: Diagnosis not present

## 2019-05-09 DIAGNOSIS — I714 Abdominal aortic aneurysm, without rupture: Secondary | ICD-10-CM | POA: Diagnosis not present

## 2019-05-09 DIAGNOSIS — I252 Old myocardial infarction: Secondary | ICD-10-CM | POA: Diagnosis not present

## 2019-05-09 DIAGNOSIS — M542 Cervicalgia: Secondary | ICD-10-CM | POA: Diagnosis not present

## 2019-05-09 DIAGNOSIS — J438 Other emphysema: Secondary | ICD-10-CM | POA: Diagnosis not present

## 2019-05-09 DIAGNOSIS — R55 Syncope and collapse: Secondary | ICD-10-CM | POA: Diagnosis not present

## 2019-05-09 DIAGNOSIS — J439 Emphysema, unspecified: Secondary | ICD-10-CM | POA: Diagnosis not present

## 2019-05-09 DIAGNOSIS — R0789 Other chest pain: Secondary | ICD-10-CM | POA: Diagnosis not present

## 2019-05-09 DIAGNOSIS — Z20828 Contact with and (suspected) exposure to other viral communicable diseases: Secondary | ICD-10-CM | POA: Diagnosis not present

## 2019-05-09 DIAGNOSIS — I2510 Atherosclerotic heart disease of native coronary artery without angina pectoris: Secondary | ICD-10-CM | POA: Diagnosis not present

## 2019-05-09 DIAGNOSIS — R5383 Other fatigue: Secondary | ICD-10-CM | POA: Diagnosis not present

## 2019-05-09 DIAGNOSIS — R531 Weakness: Secondary | ICD-10-CM | POA: Diagnosis not present

## 2019-05-09 DIAGNOSIS — R079 Chest pain, unspecified: Secondary | ICD-10-CM | POA: Diagnosis not present

## 2019-05-11 ENCOUNTER — Telehealth (INDEPENDENT_AMBULATORY_CARE_PROVIDER_SITE_OTHER): Payer: Self-pay | Admitting: Nurse Practitioner

## 2019-05-11 NOTE — Telephone Encounter (Signed)
Can you please call patient to schedule per Arna Medici. Thank you, AS, CMA

## 2019-05-11 NOTE — Telephone Encounter (Signed)
Please bring her in to see DEW only, with a left arm venous duplex

## 2019-05-11 NOTE — Telephone Encounter (Signed)
Patient called and stated she  went to [facility] Saturday due to weakness and legs giving out. Patient passed out. 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 Recommended [place] of jugular vein. [facility] was really concerned and wanted to admit patient and patient declined. Patient would like to be seen by [facility]. Please advise. AS, CMA

## 2019-05-12 ENCOUNTER — Other Ambulatory Visit (INDEPENDENT_AMBULATORY_CARE_PROVIDER_SITE_OTHER): Payer: Self-pay | Admitting: Nurse Practitioner

## 2019-05-12 ENCOUNTER — Ambulatory Visit: Payer: Medicare HMO

## 2019-05-12 DIAGNOSIS — R9389 Abnormal findings on diagnostic imaging of other specified body structures: Secondary | ICD-10-CM

## 2019-05-15 ENCOUNTER — Other Ambulatory Visit: Payer: Self-pay

## 2019-05-15 ENCOUNTER — Ambulatory Visit (INDEPENDENT_AMBULATORY_CARE_PROVIDER_SITE_OTHER): Payer: Medicare HMO | Admitting: Vascular Surgery

## 2019-05-15 ENCOUNTER — Encounter (INDEPENDENT_AMBULATORY_CARE_PROVIDER_SITE_OTHER): Payer: Self-pay | Admitting: Vascular Surgery

## 2019-05-15 ENCOUNTER — Ambulatory Visit (INDEPENDENT_AMBULATORY_CARE_PROVIDER_SITE_OTHER): Payer: Medicare HMO

## 2019-05-15 VITALS — BP 118/76 | HR 82 | Resp 12 | Ht 62.0 in | Wt 111.0 lb

## 2019-05-15 DIAGNOSIS — E785 Hyperlipidemia, unspecified: Secondary | ICD-10-CM

## 2019-05-15 DIAGNOSIS — E782 Mixed hyperlipidemia: Secondary | ICD-10-CM | POA: Diagnosis not present

## 2019-05-15 DIAGNOSIS — R9389 Abnormal findings on diagnostic imaging of other specified body structures: Secondary | ICD-10-CM

## 2019-05-15 DIAGNOSIS — Z87891 Personal history of nicotine dependence: Secondary | ICD-10-CM | POA: Diagnosis not present

## 2019-05-15 DIAGNOSIS — I714 Abdominal aortic aneurysm, without rupture, unspecified: Secondary | ICD-10-CM

## 2019-05-15 DIAGNOSIS — R55 Syncope and collapse: Secondary | ICD-10-CM | POA: Diagnosis not present

## 2019-05-15 DIAGNOSIS — I6523 Occlusion and stenosis of bilateral carotid arteries: Secondary | ICD-10-CM

## 2019-05-15 DIAGNOSIS — I10 Essential (primary) hypertension: Secondary | ICD-10-CM

## 2019-05-15 DIAGNOSIS — S15302D Unspecified injury of left internal jugular vein, subsequent encounter: Secondary | ICD-10-CM | POA: Diagnosis not present

## 2019-05-15 DIAGNOSIS — I25810 Atherosclerosis of coronary artery bypass graft(s) without angina pectoris: Secondary | ICD-10-CM | POA: Diagnosis not present

## 2019-05-15 DIAGNOSIS — I2510 Atherosclerotic heart disease of native coronary artery without angina pectoris: Secondary | ICD-10-CM | POA: Diagnosis not present

## 2019-05-15 DIAGNOSIS — Z79899 Other long term (current) drug therapy: Secondary | ICD-10-CM | POA: Diagnosis not present

## 2019-05-15 DIAGNOSIS — R42 Dizziness and giddiness: Secondary | ICD-10-CM | POA: Diagnosis not present

## 2019-05-15 DIAGNOSIS — R079 Chest pain, unspecified: Secondary | ICD-10-CM | POA: Diagnosis not present

## 2019-05-15 DIAGNOSIS — R0602 Shortness of breath: Secondary | ICD-10-CM | POA: Diagnosis not present

## 2019-05-15 NOTE — Progress Notes (Signed)
MRN : [id number]  Melissa Montes is a 63 y.o. (16-May-1956) female who presents with chief complaint of  Chief Complaint  Patient presents with  . Hospitalization Follow-up    pt was told she had blockage in jugular vein-seen at [facility]  .  History of Present Illness: Patient returns today in follow up of earlier than her scheduled visit due to an abnormality seen on a CT scan done at an outside institution.  She continues to have some syncopal episodes and lightheadedness.  She is scheduled to see her cardiologist next month after having seen his office earlier this month.  Her carotid disease was checked earlier this year and has been fine.  She has an appointment scheduled for later this summer.  Apparently, on a CT scan done at [facility] couple of weeks ago there was question about a filling defect in the left jugular vein.  This was not a venous phase to CT and I do not have the images for review.  We evaluated her left jugular vein with duplex today and there were no abnormalities seen.  There was normal flow, no evidence of DVT or other abnormalities in the jugular, subclavian, or axillary veins.  Current Outpatient Medications  Medication Sig Dispense Refill  . aspirin EC 81 MG tablet Take 162 mg by mouth every evening.     Marland Kitchen atorvastatin (LIPITOR) 80 MG tablet Take 80 mg by mouth every evening.     . baclofen (LIORESAL) 10 MG tablet Take 10 mg by mouth daily as needed for muscle spasms.    . clopidogrel (PLAVIX) 75 MG tablet Take 75 mg by mouth daily.    . diazepam (VALIUM) 10 MG tablet Take 10 mg by mouth daily as needed for anxiety.    . DULoxetine (CYMBALTA) 60 MG capsule Take 1 capsule by mouth daily.    Marland Kitchen ezetimibe (ZETIA) 10 MG tablet Take 10 mg by mouth daily.     Marland Kitchen gabapentin (NEURONTIN) 100 MG capsule Take 300 mg by mouth 3 (three) times daily.   2  . metoprolol succinate (TOPROL-XL) 25 MG 24 hr tablet Take 50 mg by mouth daily.     . Multiple Vitamin (MULTIVITAMIN) tablet  Take 1 tablet by mouth daily. Centrum Silver for Women    . nitroGLYCERIN (NITROSTAT) 0.4 MG SL tablet Place 0.4 mg under the tongue every 5 (five) minutes x 3 doses as needed for chest pain.     Marland Kitchen ondansetron (ZOFRAN) 4 MG tablet Take 1 tablet by mouth 4 (four) times daily.    . traZODone (DESYREL) 100 MG tablet Take 100 mg by mouth at bedtime.     Marland Kitchen acetaminophen (TYLENOL) 325 MG tablet Take 325-650 mg by mouth every 6 (six) hours as needed (for pain.).    Marland Kitchen b complex vitamins tablet Take 1 tablet by mouth daily.    . Calcium Carbonate-Vitamin D (CALTRATE 600+D PO) Take 600 mg by mouth 2 (two) times daily.     . celecoxib (CELEBREX) 200 MG capsule Take 200 mg by mouth daily.     No current facility-administered medications for this visit.     Past Medical History:  Diagnosis Date  . Anxiety   . Bipolar disorder ([place])   . Depression   . Heart disease   . Hyperlipidemia   . MI (myocardial infarction) [facility])     Past Surgical History:  Procedure Laterality Date  . ABDOMINAL HYSTERECTOMY    . CAROTID ANGIOGRAPHY N/A 01/13/2018  Procedure: CAROTID ANGIOGRAPHY;  Surgeon: Algernon Huxley, MD;  Location: [place] [facility];  Service: Cardiovascular;  Laterality: N/A;  . CORONARY ANGIOPLASTY WITH STENT PLACEMENT    . LEFT HEART CATH Right 07/07/2018   Procedure: Left Heart Cath and Coronary Angiography;  Surgeon: Dionisio David, MD;  Location: [place] [facility];  Service: Cardiovascular;  Laterality: Right;    Social History Social History   Tobacco Use  . Smoking status: Former Smoker    Packs/day: 1.00    Years: 41.00    Pack years: 41.00    Types: Cigarettes    Quit date: 09/19/2018    Years since quitting: 0.6  . Smokeless tobacco: Never Used  Substance Use Topics  . Alcohol use: No  . Drug use: No     Family History Family History  Problem Relation Age of Onset  . Breast cancer Mother 106  . Breast cancer Maternal Aunt        mat aunt and great aunt    Allergies   Allergen Reactions  . Keflex [Cephalexin] Hives   REVIEW OF SYSTEMS(Negative unless checked)  Constitutional: _0 ??Weight loss_1 ??Fever_2 ??Chills Cardiac:_3 ??Chest pain_4 ??Chest pressure_5 ??Palpitations _6 ??Shortness of breath when laying flat _7 ??Shortness of breath at rest _8 ??Shortness of breath with exertion. Vascular: _9 ??Pain in legs with walking_10 ??Pain in legsat rest_11 ??Pain in legs when laying flat _12 ??Claudication _13 ??Pain in feet when walking _14 ??Pain in feet at rest _15 ??Pain in feet when laying flat _16 ??History of DVT _17 ??Phlebitis _18 ??Swelling in legs _19 ??Varicose veins _20 ??Non-healing ulcers Pulmonary: _21 ??Uses home oxygen _22 ??Productive cough_23 ??Hemoptysis _24 ??Wheeze _25 ??COPD _26 ??Asthma Neurologic: _27 ??Dizziness _28 ??Blackouts _29 ??Seizures _30 ??History of stroke _31 ??History of TIA_32 ??Aphasia _33 ??Temporary blindness_34 ??Dysphagia _35 ??Weaknessor numbness in arms _36 ??Weakness or numbnessin legs Musculoskeletal: _37 ??Arthritis _38 ??Joint swelling _39 ??Joint pain _40 ??Low back pain Hematologic:_41 ??Easy bruising_42 ??Easy bleeding _43 ??Hypercoagulable state _44 ??Anemic  Gastrointestinal:_45 ??Blood in stool_46 ??Vomiting blood_47 ??Gastroesophageal reflux/heartburn_48 ??Abdominal pain Genitourinary: _49 ??Chronic kidney disease _50 ??Difficulturination _51 ??Frequenturination _52 ??Burning with urination_53 ??Hematuria Skin: _54 ??Rashes _55 ??Ulcers _56 ??Wounds Psychological: _57 ??History of anxiety_58 ??History of major depression.    Physical Examination  BP 118/76 (BP Location: Left Arm, Patient Position: Sitting, Cuff Size: Small)   Pulse 82   Resp 12   Ht _59  (1.575 m)   Wt 111 lb (50.3 kg)   BMI 20.30 kg/m  Gen:  WD/WN, NAD Head: /AT, No temporalis wasting. Ear/Nose/Throat: Hearing grossly intact, nares w/o erythema or drainage Eyes: Conjunctiva clear. Sclera non-icteric  Neck: Supple.  Trachea midline Pulmonary:  Good air movement, no use of accessory muscles.  Cardiac: RRR, no JVD Vascular:  Vessel Right Left  Radial Palpable Palpable               Musculoskeletal: M/S 5/5 throughout.  No deformity or atrophy. No edema. Neurologic: Sensation grossly intact in extremities.  Symmetrical.  Speech is fluent.  Psychiatric: Judgment intact, Mood & affect appropriate for pt's clinical situation. Dermatologic: No rashes or ulcers noted.  No cellulitis or open wounds.       Labs Recent Results (from the past 2160 hour(s))  Multiple Myeloma Panel (SPEP&IFE w/QIG)     Status: None   Collection Time: 03/31/19  2:23 PM  Result Value Ref Range   IgG (Immunoglobin G), Serum 780 586 - 1,602 mg/dL   IgA 170 87 - 352 mg/dL   IgM (Immunoglobulin M), Srm 80 26 - 217 mg/dL   Total Protein ELP 6.7 6.0 - 8.5 g/dL   Albumin SerPl Elph-Mcnc 3.9 2.9 - 4.4 g/dL   Alpha 1 0.3 0.0 - 0.4 g/dL   Alpha2 Glob SerPl Elph-Mcnc 0.9 0.4 - 1.0 g/dL  B-Globulin SerPl Elph-Mcnc 0.9 0.7 - 1.3 g/dL   Gamma Glob SerPl Elph-Mcnc 0.7 0.4 - 1.8 g/dL   M Protein SerPl Elph-Mcnc Not Observed Not Observed g/dL   Globulin, Total 2.8 2.2 - 3.9 g/dL   Albumin/Glob SerPl 1.4 0.7 - 1.7   IFE 1 Comment     Comment: (NOTE) The immunofixation pattern appears unremarkable. Evidence of monoclonal protein is not apparent.    Please Note Comment     Comment: (NOTE) Protein electrophoresis scan will follow via computer, mail, or courier delivery. Performed At: [facility] [place], [place] [id number] Rush Farmer MD [id number]   CBC with Differential/Platelet     Status: Abnormal   Collection Time: 03/31/19  2:23 PM  Result Value Ref Range   WBC 11.0 (H) 4.0 - 10.5 K/uL   RBC 4.59 3.87 - 5.11 MIL/uL   Hemoglobin 13.9 12.0 - 15.0 g/dL   HCT 41.2 36.0 - 46.0 %   MCV 89.8 80.0 - 100.0 fL   MCH 30.3 26.0 - 34.0 pg   MCHC 33.7 30.0 - 36.0 g/dL   RDW 12.5 11.5  - 15.5 %   Platelets 234 150 - 400 K/uL   nRBC 0.0 0.0 - 0.2 %   Neutrophils Relative % 63 %   Neutro Abs 7.0 1.7 - 7.7 K/uL   Lymphocytes Relative 20 %   Lymphs Abs 2.2 0.7 - 4.0 K/uL   Monocytes Relative 10 %   Monocytes Absolute 1.1 (H) 0.1 - 1.0 K/uL   Eosinophils Relative 6 %   Eosinophils Absolute 0.6 (H) 0.0 - 0.5 K/uL   Basophils Relative 1 %   Basophils Absolute 0.1 0.0 - 0.1 K/uL   Immature Granulocytes 0 %   Abs Immature Granulocytes 0.04 0.00 - 0.07 K/uL    Comment: Performed at [facility], [address] [place], [place] [address]  Comprehensive metabolic panel     Status: Abnormal   Collection Time: 03/31/19  2:23 PM  Result Value Ref Range   Sodium 140 135 - 145 mmol/L   Potassium 4.8 3.5 - 5.1 mmol/L   Chloride 104 98 - 111 mmol/L   CO2 28 22 - 32 mmol/L   Glucose, Bld 109 (H) 70 - 99 mg/dL   BUN 22 8 - 23 mg/dL   Creatinine, Ser 0.67 0.44 - 1.00 mg/dL   Calcium 9.5 8.9 - 10.3 mg/dL   Total Protein 7.3 6.5 - 8.1 g/dL   Albumin 4.2 3.5 - 5.0 g/dL   AST 25 15 - 41 U/L   ALT 21 0 - 44 U/L   Alkaline Phosphatase 101 38 - 126 U/L   Total Bilirubin 0.5 0.3 - 1.2 mg/dL   GFR calc non Af Amer >60 >60 mL/min   GFR calc Af Amer >60 >60 mL/min   Anion gap 8 5 - 15    Comment: Performed at [facility], [address] [place], [place] [address]  Kappa/lambda light chains     Status: None   Collection Time: 03/31/19  2:23 PM  Result Value Ref Range   Kappa free light chain 14.9 3.3 - 19.4 mg/L   Lamda free light chains 11.9 5.7 - 26.3 mg/L   Kappa, lamda light chain ratio 1.25 0.26 - 1.65    Comment: (NOTE) Performed At: [facility] [place], [place] [id number] Rush Farmer MD [id number]     Radiology Mr Lumbar Spine Wo Contrast  Result Date: 04/30/2019 CLINICAL DATA:  Low back and bilateral hip pain.  No known injury. EXAM: MRI LUMBAR SPINE WITHOUT CONTRAST TECHNIQUE: Multiplanar, multisequence MR imaging of the  lumbar spine was performed. No intravenous contrast was administered. COMPARISON:  None. FINDINGS: Segmentation: There are five lumbar type vertebral bodies. The last full intervertebral disc space is labeled L5-S1. Alignment:  Normal Vertebrae:  Normal marrow signal.  No bone lesions or fracture. Conus medullaris and cauda equina: Conus extends to the T12-L1 level. Conus and cauda equina appear normal. Paraspinal and other soft tissues: Age advanced atherosclerotic changes involving the aorta. There is a distal abdominal aortic aneurysm measuring at least 4 cm. This measured 2.3 cm on the prior CT scan from 2013. Disc levels: T12-L1: Mild bulging annulus and shallow central and slightly left paracentral disc protrusion with mild mass effect on the ventral thecal sac. No foraminal stenosis. L1-2: Slight bulging annulus and small annular fissure but no spinal, lateral recess or foraminal stenosis. L2-3: No significant findings. L3-4: Mild facet disease and slight bulging annulus with minimal lateral recess encroachment bilaterally but no spinal or foraminal stenosis. L4-5: Annular bulge and mild to moderate facet disease with mild bilateral lateral recess stenosis. There is also a shallow lateral foraminal and extraforaminal disc protrusion but no direct mass effect on the exiting left L4 nerve root. L5-S1: No significant findings. IMPRESSION: 1. **An incidental finding of potential clinical significance has been found. 4 cm abdominal aortic aneurysm just above the iliac artery bifurcation. This is not completely visualized and given the patient's age and growth since prior CT, I would recommend a dedicated CTA abdomen/pelvis for more complete evaluation. ** 2. Mild bulging annulus and small central and slightly left paracentral disc protrusion at T12-L1. 3. Minimal bilateral lateral recess encroachment at L3-4. 4. Mild to moderate bilateral lateral recess stenosis at L4-5. There is also a shallow left  foraminal/extraforaminal disc protrusion but no direct mass effect on the left L4 nerve root. 5. Lower lumbar facet disease but no pars defects. Electronically Signed   By: Marijo Sanes M.D.   On: 04/30/2019 09:41    Assessment/Plan Hyperlipemia lipid control important in reducing the progression of atherosclerotic disease. Continue statin therapy   AAA (abdominal aortic aneurysm) without rupture (HCC) 3.4 cm in maximal diameter at last check.Some associated iliac artery stenosis but her claudication symptoms are mild. We will continue to recheck on an annual basis which is scheduled for later this year.  Benign essential hypertension blood pressure control important in reducing the progression of atherosclerotic disease. On appropriate oral medications.   Bilateral carotid artery stenosis Duplex at last visit shows velocities in the 1 to 39% range bilaterally.  This would be very unlikely to be the cause of her dizziness and presyncopal episodes.  No changes in her medical regimen from my point of view at this time.  This can be rechecked in 1 year.   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  This was not a venous phase to CT and I do not have the images for review.  We evaluated her left jugular vein with duplex today and there were no abnormalities seen.  There was normal flow, no evidence of DVT or other abnormalities in the jugular, subclavian, or axillary veins. At this point, and a CT scan was not done to evaluate venous disease, this was likely an aberrant  finding.  No abnormality is seen in her jugular, axillary, or subclavian veins today.  No other therapy is necessary.  Keep her follow-up appointment later this year for other vascular issues    Leotis Pain, MD  05/15/2019 10:27 AM    This note was created with Dragon medical  transcription system.  Any errors from dictation are purely unintentional

## 2019-05-15 NOTE — Assessment & Plan Note (Signed)
on a CT scan done at [facility] couple of weeks ago there was question about a filling defect in the left jugular vein.  This was not a venous phase to CT and I do not have the images for review.  We evaluated her left jugular vein with duplex today and there were no abnormalities seen.  There was normal flow, no evidence of DVT or other abnormalities in the jugular, subclavian, or axillary veins. At this point, and a CT scan was not done to evaluate venous disease, this was likely an aberrant finding.  No abnormality is seen in her jugular, axillary, or subclavian veins today.  No other therapy is necessary.  Keep her follow-up appointment later this year for other vascular issues

## 2019-05-21 DIAGNOSIS — R0602 Shortness of breath: Secondary | ICD-10-CM | POA: Diagnosis not present

## 2019-05-21 DIAGNOSIS — R55 Syncope and collapse: Secondary | ICD-10-CM | POA: Diagnosis not present

## 2019-05-21 DIAGNOSIS — E782 Mixed hyperlipidemia: Secondary | ICD-10-CM | POA: Diagnosis not present

## 2019-05-21 DIAGNOSIS — R079 Chest pain, unspecified: Secondary | ICD-10-CM | POA: Diagnosis not present

## 2019-05-21 DIAGNOSIS — I10 Essential (primary) hypertension: Secondary | ICD-10-CM | POA: Diagnosis not present

## 2019-05-21 DIAGNOSIS — I2510 Atherosclerotic heart disease of native coronary artery without angina pectoris: Secondary | ICD-10-CM | POA: Diagnosis not present

## 2019-05-21 DIAGNOSIS — I25810 Atherosclerosis of coronary artery bypass graft(s) without angina pectoris: Secondary | ICD-10-CM | POA: Diagnosis not present

## 2019-05-26 DIAGNOSIS — R079 Chest pain, unspecified: Secondary | ICD-10-CM | POA: Diagnosis not present

## 2019-05-29 ENCOUNTER — Other Ambulatory Visit: Payer: Self-pay

## 2019-05-29 ENCOUNTER — Ambulatory Visit
Admission: RE | Admit: 2019-05-29 | Discharge: 2019-05-29 | Disposition: A | Discharge status: Home / Self Care | Payer: Medicare HMO | Source: Ambulatory Visit | Attending: Family | Admitting: Family

## 2019-05-29 DIAGNOSIS — Z1231 Encounter for screening mammogram for malignant neoplasm of breast: Secondary | ICD-10-CM | POA: Insufficient documentation

## 2019-06-02 ENCOUNTER — Other Ambulatory Visit: Payer: Self-pay | Admitting: Orthopedic Surgery

## 2019-06-02 DIAGNOSIS — G8929 Other chronic pain: Secondary | ICD-10-CM

## 2019-06-02 DIAGNOSIS — M5441 Lumbago with sciatica, right side: Secondary | ICD-10-CM

## 2019-06-02 DIAGNOSIS — M5442 Lumbago with sciatica, left side: Secondary | ICD-10-CM

## 2019-06-04 DIAGNOSIS — I10 Essential (primary) hypertension: Secondary | ICD-10-CM | POA: Diagnosis not present

## 2019-06-04 DIAGNOSIS — R0602 Shortness of breath: Secondary | ICD-10-CM | POA: Diagnosis not present

## 2019-06-04 DIAGNOSIS — R079 Chest pain, unspecified: Secondary | ICD-10-CM | POA: Diagnosis not present

## 2019-06-04 DIAGNOSIS — I2510 Atherosclerotic heart disease of native coronary artery without angina pectoris: Secondary | ICD-10-CM | POA: Diagnosis not present

## 2019-06-04 DIAGNOSIS — I25810 Atherosclerosis of coronary artery bypass graft(s) without angina pectoris: Secondary | ICD-10-CM | POA: Diagnosis not present

## 2019-06-04 DIAGNOSIS — E782 Mixed hyperlipidemia: Secondary | ICD-10-CM | POA: Diagnosis not present

## 2019-06-04 DIAGNOSIS — R55 Syncope and collapse: Secondary | ICD-10-CM | POA: Diagnosis not present

## 2019-06-10 ENCOUNTER — Other Ambulatory Visit: Payer: Self-pay | Admitting: Orthopedic Surgery

## 2019-06-10 DIAGNOSIS — M25552 Pain in left hip: Secondary | ICD-10-CM

## 2019-06-10 DIAGNOSIS — M25551 Pain in right hip: Secondary | ICD-10-CM

## 2019-06-10 DIAGNOSIS — M7061 Trochanteric bursitis, right hip: Secondary | ICD-10-CM

## 2019-06-11 ENCOUNTER — Ambulatory Visit: Payer: Medicare HMO

## 2019-06-11 ENCOUNTER — Ambulatory Visit: Admission: RE | Admit: 2019-06-11 | Payer: Medicare HMO | Source: Ambulatory Visit

## 2019-06-24 ENCOUNTER — Ambulatory Visit
Admission: RE | Admit: 2019-06-24 | Discharge: 2019-06-24 | Disposition: A | Discharge status: Home / Self Care | Payer: Medicare HMO | Source: Ambulatory Visit | Attending: Orthopedic Surgery | Admitting: Orthopedic Surgery

## 2019-06-24 ENCOUNTER — Other Ambulatory Visit: Payer: Self-pay

## 2019-06-24 DIAGNOSIS — M7061 Trochanteric bursitis, right hip: Secondary | ICD-10-CM | POA: Insufficient documentation

## 2019-06-24 DIAGNOSIS — M25552 Pain in left hip: Secondary | ICD-10-CM | POA: Insufficient documentation

## 2019-06-24 DIAGNOSIS — M25551 Pain in right hip: Secondary | ICD-10-CM | POA: Diagnosis not present

## 2019-06-26 ENCOUNTER — Ambulatory Visit (INDEPENDENT_AMBULATORY_CARE_PROVIDER_SITE_OTHER): Payer: Medicare HMO | Admitting: Vascular Surgery

## 2019-06-26 ENCOUNTER — Ambulatory Visit (INDEPENDENT_AMBULATORY_CARE_PROVIDER_SITE_OTHER): Payer: Medicare HMO

## 2019-06-26 ENCOUNTER — Encounter (INDEPENDENT_AMBULATORY_CARE_PROVIDER_SITE_OTHER): Payer: Self-pay | Admitting: Vascular Surgery

## 2019-06-26 ENCOUNTER — Other Ambulatory Visit: Payer: Self-pay

## 2019-06-26 VITALS — BP 107/71 | HR 70 | Resp 10 | Ht 62.0 in | Wt 111.0 lb

## 2019-06-26 DIAGNOSIS — E785 Hyperlipidemia, unspecified: Secondary | ICD-10-CM

## 2019-06-26 DIAGNOSIS — I6523 Occlusion and stenosis of bilateral carotid arteries: Secondary | ICD-10-CM

## 2019-06-26 DIAGNOSIS — I10 Essential (primary) hypertension: Secondary | ICD-10-CM

## 2019-06-26 DIAGNOSIS — I714 Abdominal aortic aneurysm, without rupture, unspecified: Secondary | ICD-10-CM

## 2019-06-26 NOTE — Progress Notes (Signed)
MRN : [id number]  Melissa Montes is a 63 y.o. (1956-06-25) female who presents with chief complaint of  Chief Complaint  Patient presents with  . Follow-up  .  History of Present Illness: Patient returns today in follow up of multiple vascular issues.  The patient reports she still feels low on energy and overall not well even after her coronary bypass.  She still has dizziness and has had some passing out episodes.  No focal arm or leg weakness or numbness, speech or swallowing difficulty.  Her carotid duplex continues to show 1 to 39% ICA stenosis bilaterally without significant progression from previous study.  She also has had a recent CT scan performed at another institution which showed no significant carotid stenosis on either side. She is also seen today for her aneurysm.  Current Outpatient Medications  Medication Sig Dispense Refill  . aspirin EC 81 MG tablet Take 162 mg by mouth every evening.     Marland Kitchen atorvastatin (LIPITOR) 80 MG tablet Take 80 mg by mouth every evening.     . diazepam (VALIUM) 10 MG tablet Take 10 mg by mouth daily as needed for anxiety.    Marland Kitchen ezetimibe (ZETIA) 10 MG tablet Take 10 mg by mouth daily.     . metoprolol succinate (TOPROL-XL) 25 MG 24 hr tablet Take 50 mg by mouth daily.     . nitroGLYCERIN (NITROSTAT) 0.4 MG SL tablet Place 0.4 mg under the tongue every 5 (five) minutes x 3 doses as needed for chest pain.     . traZODone (DESYREL) 100 MG tablet Take 100 mg by mouth at bedtime.     Marland Kitchen acetaminophen (TYLENOL) 325 MG tablet Take 325-650 mg by mouth every 6 (six) hours as needed (for pain.).    Marland Kitchen b complex vitamins tablet Take 1 tablet by mouth daily.    . baclofen (LIORESAL) 10 MG tablet Take 10 mg by mouth daily as needed for muscle spasms.    . Calcium Carbonate-Vitamin D (CALTRATE 600+D PO) Take 600 mg by mouth 2 (two) times daily.     . celecoxib (CELEBREX) 200 MG capsule Take 200 mg by mouth daily.    . clopidogrel (PLAVIX) 75 MG tablet Take  75 mg by mouth daily.    . DULoxetine (CYMBALTA) 60 MG capsule Take 1 capsule by mouth daily.    Marland Kitchen gabapentin (NEURONTIN) 100 MG capsule Take 300 mg by mouth 3 (three) times daily.   2  . Multiple Vitamin (MULTIVITAMIN) tablet Take 1 tablet by mouth daily. Centrum Silver for Women    . ondansetron (ZOFRAN) 4 MG tablet Take 1 tablet by mouth 4 (four) times daily.     No current facility-administered medications for this visit.     Past Medical History:  Diagnosis Date  . Anxiety   . Bipolar disorder ([place])   . Depression   . Heart disease   . Hyperlipidemia   . MI (myocardial infarction) [facility])     Past Surgical History:  Procedure Laterality Date  . ABDOMINAL HYSTERECTOMY    . CAROTID ANGIOGRAPHY N/A 01/13/2018   Procedure: CAROTID ANGIOGRAPHY;  Surgeon: Algernon Huxley, MD;  Location: [place] [facility];  Service: Cardiovascular;  Laterality: N/A;  . CORONARY ANGIOPLASTY WITH STENT PLACEMENT    . LEFT HEART CATH Right 07/07/2018   Procedure: Left Heart Cath and Coronary Angiography;  Surgeon: Dionisio David, MD;  Location: [facility] [facility];  Service: Cardiovascular;  Laterality: Right;  Social History        Tobacco Use  . Smoking status: Former Smoker    Packs/day: 1.00    Years: 41.00    Pack years: 41.00    Types: Cigarettes    Quit date: 09/19/2018    Years since quitting: 0.6  . Smokeless tobacco: Never Used  Substance Use Topics  . Alcohol use: No  . Drug use: No     Family History      Family History  Problem Relation Age of Onset  . Breast cancer Mother 91  . Breast cancer Maternal Aunt        mat aunt and great aunt        Allergies  Allergen Reactions  . Keflex [Cephalexin] Hives   REVIEW OF SYSTEMS(Negative unless checked)  Constitutional: '[]' ???Weight loss'[]' ???Fever'[]' ???Chills Cardiac:'[]' ???Chest pain'[]' ???Chest pressure'[x]' ???Palpitations '[]' ???Shortness of breath when laying flat '[]' ???Shortness of breath at  rest '[x]' ???Shortness of breath with exertion. Vascular: '[]' ???Pain in legs with walking'[]' ???Pain in legsat rest'[]' ???Pain in legs when laying flat '[x]' ???Claudication '[]' ???Pain in feet when walking '[]' ???Pain in feet at rest '[]' ???Pain in feet when laying flat '[]' ???History of DVT '[]' ???Phlebitis '[]' ???Swelling in legs '[]' ???Varicose veins '[]' ???Non-healing ulcers Pulmonary: '[]' ???Uses home oxygen '[]' ???Productive cough'[]' ???Hemoptysis '[]' ???Wheeze '[]' ???COPD '[]' ???Asthma Neurologic: '[x]' ???Dizziness '[]' ???Blackouts '[]' ???Seizures '[]' ???History of stroke '[]' ???History of TIA'[]' ???Aphasia '[]' ???Temporary blindness'[]' ???Dysphagia '[]' ???Weaknessor numbness in arms '[]' ???Weakness or numbnessin legs Musculoskeletal: '[]' ???Arthritis '[]' ???Joint swelling '[]' ???Joint pain '[]' ???Low back pain Hematologic:'[]' ???Easy bruising'[]' ???Easy bleeding '[]' ???Hypercoagulable state '[]' ???Anemic  Gastrointestinal:'[]' ???Blood in stool'[]' ???Vomiting blood'[x]' ???Gastroesophageal reflux/heartburn'[]' ???Abdominal pain Genitourinary: '[]' ???Chronic kidney disease '[]' ???Difficulturination '[]' ???Frequenturination '[]' ???Burning with urination'[]' ???Hematuria Skin: '[]' ???Rashes '[]' ???Ulcers '[]' ???Wounds Psychological: '[x]' ???History of anxiety'[x]' ???History of major depression.    Physical Examination  BP 107/71 (BP Location: Left Arm, Patient Position: Sitting, Cuff Size: Normal)   Pulse 70   Resp 10   Ht '5\' 2"'  (1.575 m)   Wt 111 lb (50.3 kg)   BMI 20.30 kg/m  Gen:  WD/WN, NAD Head: [place]/AT, No temporalis wasting. Ear/Nose/Throat: Hearing grossly intact, nares w/o erythema or drainage Eyes: Conjunctiva clear. Sclera non-icteric Neck: Supple.  Trachea midline. No bruits Pulmonary:  Good air movement, no use of accessory muscles.  Cardiac: RRR, no JVD Vascular:  Vessel Right Left  Radial Palpable Palpable                                   Gastrointestinal:  soft, non-tender/non-distended. No guarding/reflex. Mildly increased aortic impulse  Musculoskeletal: M/S 5/5 throughout.  No deformity or atrophy. No edema. Neurologic: Sensation grossly intact in extremities.  Symmetrical.  Speech is fluent.  Psychiatric: Judgment intact, Mood & affect appropriate for pt's clinical situation. Dermatologic: No rashes or ulcers noted.  No cellulitis or open wounds.       Labs Recent Results (from the past 2160 hour(s))  Multiple Myeloma Panel (SPEP&IFE w/QIG)     Status: None   Collection Time: 03/31/19  2:23 PM  Result Value Ref Range   IgG (Immunoglobin G), Serum 780 586 - 1,602 mg/dL   IgA 170 87 - 352 mg/dL   IgM (Immunoglobulin M), Srm 80 26 - 217 mg/dL   Total Protein ELP 6.7 6.0 - 8.5 g/dL   Albumin SerPl Elph-Mcnc 3.9 2.9 - 4.4 g/dL   Alpha 1 0.3 0.0 - 0.4 g/dL   Alpha2 Glob SerPl Elph-Mcnc 0.9 0.4 - 1.0 g/dL   B-Globulin SerPl Elph-Mcnc 0.9 0.7 - 1.3 g/dL   Gamma Glob SerPl Elph-Mcnc 0.7 0.4 - 1.8 g/dL   M Protein  SerPl Elph-Mcnc Not Observed Not Observed g/dL   Globulin, Total 2.8 2.2 - 3.9 g/dL   Albumin/Glob SerPl 1.4 0.7 - 1.7   IFE 1 Comment     Comment: (NOTE) The immunofixation pattern appears unremarkable. Evidence of monoclonal protein is not apparent.    Please Note Comment     Comment: (NOTE) Protein electrophoresis scan will follow via computer, mail, or courier delivery. Performed At: [facility] [place], [place] [id number] Rush Farmer MD [id number]   CBC with Differential/Platelet     Status: Abnormal   Collection Time: 03/31/19  2:23 PM  Result Value Ref Range   WBC 11.0 (H) 4.0 - 10.5 K/uL   RBC 4.59 3.87 - 5.11 MIL/uL   Hemoglobin 13.9 12.0 - 15.0 g/dL   HCT 41.2 36.0 - 46.0 %   MCV 89.8 80.0 - 100.0 fL   MCH 30.3 26.0 - 34.0 pg   MCHC 33.7 30.0 - 36.0 g/dL   RDW 12.5 11.5 - 15.5 %   Platelets 234 150 - 400 K/uL   nRBC 0.0 0.0 - 0.2 %   Neutrophils Relative % 63 %   Neutro  Abs 7.0 1.7 - 7.7 K/uL   Lymphocytes Relative 20 %   Lymphs Abs 2.2 0.7 - 4.0 K/uL   Monocytes Relative 10 %   Monocytes Absolute 1.1 (H) 0.1 - 1.0 K/uL   Eosinophils Relative 6 %   Eosinophils Absolute 0.6 (H) 0.0 - 0.5 K/uL   Basophils Relative 1 %   Basophils Absolute 0.1 0.0 - 0.1 K/uL   Immature Granulocytes 0 %   Abs Immature Granulocytes 0.04 0.00 - 0.07 K/uL    Comment: Performed at [facility], [address] [place], [place] [address]  Comprehensive metabolic panel     Status: Abnormal   Collection Time: 03/31/19  2:23 PM  Result Value Ref Range   Sodium 140 135 - 145 mmol/L   Potassium 4.8 3.5 - 5.1 mmol/L   Chloride 104 98 - 111 mmol/L   CO2 28 22 - 32 mmol/L   Glucose, Bld 109 (H) 70 - 99 mg/dL   BUN 22 8 - 23 mg/dL   Creatinine, Ser 0.67 0.44 - 1.00 mg/dL   Calcium 9.5 8.9 - 10.3 mg/dL   Total Protein 7.3 6.5 - 8.1 g/dL   Albumin 4.2 3.5 - 5.0 g/dL   AST 25 15 - 41 U/L   ALT 21 0 - 44 U/L   Alkaline Phosphatase 101 38 - 126 U/L   Total Bilirubin 0.5 0.3 - 1.2 mg/dL   GFR calc non Af Amer >60 >60 mL/min   GFR calc Af Amer >60 >60 mL/min   Anion gap 8 5 - 15    Comment: Performed at [facility], [address] [place], [place] [address]  Kappa/lambda light chains     Status: None   Collection Time: 03/31/19  2:23 PM  Result Value Ref Range   Kappa free light chain 14.9 3.3 - 19.4 mg/L   Lamda free light chains 11.9 5.7 - 26.3 mg/L   Kappa, lamda light chain ratio 1.25 0.26 - 1.65    Comment: (NOTE) Performed At: [facility] [place], [place] [id number] Rush Farmer MD RR:[id number]     Radiology Mr Hip Right Wo Contrast  Result Date: 06/24/2019 CLINICAL DATA:  Bilateral hip pain for 1 year, right greater than left. EXAM: MR OF THE RIGHT HIP WITHOUT CONTRAST TECHNIQUE: Multiplanar, multisequence MR  imaging was performed. No intravenous contrast was administered. COMPARISON:  None. FINDINGS: Bones: Normal. Articular  cartilage and labrum Articular cartilage:  Normal. Labrum:  Normal. Joint or bursal effusion Joint effusion:  None Bursae: No bursitis. Muscles and tendons Muscles and tendons:  Normal. Other findings Miscellaneous: No adenopathy or mass lesions. Uterus is been removed. Multiple tiny cysts in both ovaries. IMPRESSION: Essentially normal MRI of the right hip. Electronically Signed   By: Lorriane Shire M.D.   On: 06/24/2019 19:41   Mm 3d Screen Breast Bilateral  Result Date: 05/29/2019 CLINICAL DATA:  Screening. EXAM: DIGITAL SCREENING BILATERAL MAMMOGRAM WITH TOMO AND CAD COMPARISON:  Previous exam(s). ACR Breast Density Category b: There are scattered areas of fibroglandular density. FINDINGS: There are no findings suspicious for malignancy. Images were processed with CAD. IMPRESSION: No mammographic evidence of malignancy. A result letter of this screening mammogram will be mailed directly to the patient. RECOMMENDATION: Screening mammogram in one year. (Code:SM-B-01Y) BI-RADS CATEGORY  1: Negative. Electronically Signed   By: Curlene Dolphin M.D.   On: 05/29/2019 14:30    Assessment/Plan Hyperlipemia lipid control important in reducing the progression of atherosclerotic disease. Continue statin therapy   AAA (abdominal aortic aneurysm) without rupture (HCC) 3.4 cm in maximal diameterat last check.Some associated iliac artery stenosis but her claudication symptoms are mild. We will continue to recheck on an annual basiswhich is scheduled for later this year.  Benign essential hypertension blood pressure control important in reducing the progression of atherosclerotic disease. On appropriate oral medications.  Bilateral carotid artery stenosis Her carotid duplex continues to show 1 to 39% ICA stenosis bilaterally without significant progression from previous study.  This is unlikely to be the cause of her dizziness.  She is on appropriate medical therapy.  We can recheck this in 1 year   AAA (abdominal aortic aneurysm) without rupture ([place]) Her abdominal aortic aneurysm has shown some growth since her last visit now measures just less than 4 cm in maximal diameter.  This represents about 5 mm of growth in a year and I am going to shorten her follow-up interval to 6 months.  If she continues to have significant growth we may consider repair at an earlier size than 5 cm.    Leotis Pain, MD  06/26/2019 12:17 PM    This note was created with Dragon medical transcription system.  Any errors from dictation are purely unintentional

## 2019-06-26 NOTE — Assessment & Plan Note (Signed)
Her carotid duplex continues to show 1 to 39% ICA stenosis bilaterally without significant progression from previous study.  This is unlikely to be the cause of her dizziness.  She is on appropriate medical therapy.  We can recheck this in 1 year

## 2019-06-26 NOTE — Patient Instructions (Signed)
Abdominal Aortic Aneurysm  An aneurysm is a bulge in one of the blood vessels that carry blood away from the heart (artery). It happens when blood pushes up against a weak or damaged place in the wall of an artery. An abdominal aortic aneurysm happens in the main artery of the body (aorta). Some aneurysms may not cause problems. If it grows, it can burst or tear, causing bleeding inside the body. This is an emergency. It needs to be treated right away. What are the causes? The exact cause of this condition is not known. What increases the risk? The following may make you more likely to get this condition:  Being a female who is 60 years of age or older.  Being white (Caucasian).  Using tobacco.  Having a family history of aneurysms.  Having the following conditions: ? Hardening of the arteries (arteriosclerosis). ? Inflammation of the walls of an artery (arteritis). ? Certain genetic conditions. ? Being very overweight (obesity). ? An infection in the wall of the aorta (infectious aortitis). ? High cholesterol. ? High blood pressure (hypertension). What are the signs or symptoms? Symptoms depend on the size of the aneurysm and how fast it is growing. Most grow slowly and do not cause any symptoms. If symptoms do occur, they may include:  Pain in the belly (abdomen), side, or back.  Feeling full after eating only small amounts of food.  Feeling a throbbing lump in the belly. Symptoms that the aneurysm has burst (ruptured) include:  Sudden, very bad pain in the belly, side, or back.  Feeling sick to your stomach (nauseous).  Throwing up (vomiting).  Feeling light-headed or passing out. How is this treated? Treatment for this condition depends on:  The size of the aneurysm.  How fast it is growing.  Your age.  Your risk of having it burst. If your aneurysm is smaller than 2 inches (5 cm), your doctor may manage it by:  Checking it often to see if it is getting bigger.  You may have an imaging test (ultrasound) to check it every 3-6 months, every year, or every few years.  Giving you medicines to: ? Control blood pressure. ? Treat pain. ? Fight infection. If your aneurysm is larger than 2 inches (5 cm), you may need surgery to fix it. Follow these instructions at home: Lifestyle  Do not use any products that have nicotine or tobacco in them. This includes cigarettes, e-cigarettes, and chewing tobacco. If you need help quitting, ask your doctor.  Get regular exercise. Ask your doctor what types of exercise are best for you. Eating and drinking  Eat a heart-healthy diet. This includes eating plenty of: ? Fresh fruits and vegetables. ? Whole grains. ? Low-fat (lean) protein. ? Low-fat dairy products.  Avoid foods that are high in saturated fat and cholesterol. These foods include red meat and some dairy products.  Do not drink alcohol if: ? Your doctor tells you not to drink. ? You are pregnant, may be pregnant, or are planning to become pregnant.  If you drink alcohol: ? Limit how much you use to:  0-1 drink a day for women.  0-2 drinks a day for men. ? Be aware of how much alcohol is in your drink. In the [place] one drink equals any of these:  One typical bottle of beer (12 oz).  One-half glass of wine (5 oz).  One shot of hard liquor (1 oz). General instructions  Take over-the-counter and prescription medicines only as   told by your doctor.  Keep your blood pressure within normal limits. Ask your doctor what your blood pressure should be.  Have your blood sugar (glucose) level and cholesterol levels checked regularly. Keep your blood sugar level and cholesterol levels within normal limits.  Avoid heavy lifting and activities that take a lot of effort. Ask your doctor what activities are safe for you.  Keep all follow-up visits as told by your doctor. This is important. ? Talk to your doctor about regular screenings to see if the  aneurysm is getting bigger. Contact a doctor if you:  Have pain in your belly, side, or back.  Have a throbbing feeling in your belly.  Have a family history of aneurysms. Get help right away if you:  Have sudden, bad pain in your belly, side, or back.  Feel sick to your stomach.  Throw up.  Have trouble pooping (constipation).  Have trouble peeing (urinating).  Feel light-headed.  Have a fast heart rate when you stand.  Have sweaty skin that is cold to the touch (clammy).  Have shortness of breath.  Have a fever. These symptoms may be an emergency. Do not wait to see if the symptoms will go away. Get medical help right away. Call your local emergency services (911 in the [place] Do not drive yourself to the hospital. Summary  An aneurysm is a bulge in one of the blood vessels that carry blood away from the heart (artery). Some aneurysms may not cause problems.  You may need to have yours checked often. If it grows, it can burst or tear. This causes bleeding inside the body. It needs to be treated right away.  Follow instructions from your doctor about healthy lifestyle changes.  Keep all follow-up visits as told by your doctor. This is important. This information is not intended to replace advice given to you by your health care provider. Make sure you discuss any questions you have with your health care provider. Document Released: 03/02/2013 Document Revised: 02/23/2019 Document Reviewed: 06/14/2018 Elsevier Patient Education  2020 Elsevier Inc.  

## 2019-06-26 NOTE — Assessment & Plan Note (Signed)
Her abdominal aortic aneurysm has shown some growth since her last visit now measures just less than 4 cm in maximal diameter.  This represents about 5 mm of growth in a year and I am going to shorten her follow-up interval to 6 months.  If she continues to have significant growth we may consider repair at an earlier size than 5 cm.

## 2019-06-29 DIAGNOSIS — M810 Age-related osteoporosis without current pathological fracture: Secondary | ICD-10-CM | POA: Diagnosis not present

## 2019-06-29 DIAGNOSIS — M5126 Other intervertebral disc displacement, lumbar region: Secondary | ICD-10-CM | POA: Diagnosis not present

## 2019-06-29 DIAGNOSIS — M47816 Spondylosis without myelopathy or radiculopathy, lumbar region: Secondary | ICD-10-CM | POA: Diagnosis not present

## 2019-06-29 DIAGNOSIS — M5441 Lumbago with sciatica, right side: Secondary | ICD-10-CM | POA: Diagnosis not present

## 2019-06-29 DIAGNOSIS — M5442 Lumbago with sciatica, left side: Secondary | ICD-10-CM | POA: Diagnosis not present

## 2019-07-09 DIAGNOSIS — M810 Age-related osteoporosis without current pathological fracture: Secondary | ICD-10-CM | POA: Diagnosis not present

## 2019-07-16 DIAGNOSIS — M47816 Spondylosis without myelopathy or radiculopathy, lumbar region: Secondary | ICD-10-CM | POA: Diagnosis not present

## 2019-08-11 DIAGNOSIS — I10 Essential (primary) hypertension: Secondary | ICD-10-CM | POA: Diagnosis not present

## 2019-08-11 DIAGNOSIS — I2510 Atherosclerotic heart disease of native coronary artery without angina pectoris: Secondary | ICD-10-CM | POA: Diagnosis not present

## 2019-08-11 DIAGNOSIS — Z23 Encounter for immunization: Secondary | ICD-10-CM | POA: Diagnosis not present

## 2019-08-11 DIAGNOSIS — E782 Mixed hyperlipidemia: Secondary | ICD-10-CM | POA: Diagnosis not present

## 2019-08-12 DIAGNOSIS — L989 Disorder of the skin and subcutaneous tissue, unspecified: Secondary | ICD-10-CM | POA: Diagnosis not present

## 2019-08-12 DIAGNOSIS — K130 Diseases of lips: Secondary | ICD-10-CM | POA: Diagnosis not present

## 2019-08-12 DIAGNOSIS — R21 Rash and other nonspecific skin eruption: Secondary | ICD-10-CM | POA: Diagnosis not present

## 2019-08-12 DIAGNOSIS — L249 Irritant contact dermatitis, unspecified cause: Secondary | ICD-10-CM | POA: Diagnosis not present

## 2019-09-09 DIAGNOSIS — M47816 Spondylosis without myelopathy or radiculopathy, lumbar region: Secondary | ICD-10-CM | POA: Diagnosis not present

## 2019-09-09 DIAGNOSIS — M5126 Other intervertebral disc displacement, lumbar region: Secondary | ICD-10-CM | POA: Diagnosis not present

## 2019-09-09 DIAGNOSIS — M7062 Trochanteric bursitis, left hip: Secondary | ICD-10-CM | POA: Diagnosis not present

## 2019-09-09 DIAGNOSIS — M7061 Trochanteric bursitis, right hip: Secondary | ICD-10-CM | POA: Diagnosis not present

## 2019-09-29 DIAGNOSIS — M5136 Other intervertebral disc degeneration, lumbar region: Secondary | ICD-10-CM | POA: Diagnosis not present

## 2019-09-29 DIAGNOSIS — M47816 Spondylosis without myelopathy or radiculopathy, lumbar region: Secondary | ICD-10-CM | POA: Diagnosis not present

## 2019-11-23 DIAGNOSIS — Z20828 Contact with and (suspected) exposure to other viral communicable diseases: Secondary | ICD-10-CM | POA: Diagnosis not present

## 2019-11-23 DIAGNOSIS — E559 Vitamin D deficiency, unspecified: Secondary | ICD-10-CM | POA: Diagnosis not present

## 2019-11-23 DIAGNOSIS — R5383 Other fatigue: Secondary | ICD-10-CM | POA: Diagnosis not present

## 2019-11-23 DIAGNOSIS — I2510 Atherosclerotic heart disease of native coronary artery without angina pectoris: Secondary | ICD-10-CM | POA: Diagnosis not present

## 2019-11-23 DIAGNOSIS — I10 Essential (primary) hypertension: Secondary | ICD-10-CM | POA: Diagnosis not present

## 2019-11-23 DIAGNOSIS — R0602 Shortness of breath: Secondary | ICD-10-CM | POA: Diagnosis not present

## 2019-11-23 DIAGNOSIS — E782 Mixed hyperlipidemia: Secondary | ICD-10-CM | POA: Diagnosis not present

## 2019-11-24 DIAGNOSIS — R002 Palpitations: Secondary | ICD-10-CM | POA: Diagnosis not present

## 2019-11-24 DIAGNOSIS — R5383 Other fatigue: Secondary | ICD-10-CM | POA: Diagnosis not present

## 2019-11-24 DIAGNOSIS — E782 Mixed hyperlipidemia: Secondary | ICD-10-CM | POA: Diagnosis not present

## 2019-11-24 DIAGNOSIS — R079 Chest pain, unspecified: Secondary | ICD-10-CM | POA: Diagnosis not present

## 2019-11-24 DIAGNOSIS — F17210 Nicotine dependence, cigarettes, uncomplicated: Secondary | ICD-10-CM | POA: Diagnosis not present

## 2019-11-24 DIAGNOSIS — R69 Illness, unspecified: Secondary | ICD-10-CM | POA: Diagnosis not present

## 2019-11-24 DIAGNOSIS — I10 Essential (primary) hypertension: Secondary | ICD-10-CM | POA: Diagnosis not present

## 2019-11-24 DIAGNOSIS — I25810 Atherosclerosis of coronary artery bypass graft(s) without angina pectoris: Secondary | ICD-10-CM | POA: Diagnosis not present

## 2019-11-24 DIAGNOSIS — Z20828 Contact with and (suspected) exposure to other viral communicable diseases: Secondary | ICD-10-CM | POA: Diagnosis not present

## 2019-11-24 DIAGNOSIS — R0602 Shortness of breath: Secondary | ICD-10-CM | POA: Diagnosis not present

## 2019-11-24 DIAGNOSIS — I2510 Atherosclerotic heart disease of native coronary artery without angina pectoris: Secondary | ICD-10-CM | POA: Diagnosis not present

## 2019-11-24 DIAGNOSIS — I509 Heart failure, unspecified: Secondary | ICD-10-CM | POA: Diagnosis not present

## 2019-11-24 DIAGNOSIS — E559 Vitamin D deficiency, unspecified: Secondary | ICD-10-CM | POA: Diagnosis not present

## 2019-12-01 DIAGNOSIS — M47816 Spondylosis without myelopathy or radiculopathy, lumbar region: Secondary | ICD-10-CM | POA: Diagnosis not present

## 2019-12-08 DIAGNOSIS — J449 Chronic obstructive pulmonary disease, unspecified: Secondary | ICD-10-CM | POA: Diagnosis not present

## 2019-12-08 DIAGNOSIS — R079 Chest pain, unspecified: Secondary | ICD-10-CM | POA: Diagnosis not present

## 2019-12-08 DIAGNOSIS — I2510 Atherosclerotic heart disease of native coronary artery without angina pectoris: Secondary | ICD-10-CM | POA: Diagnosis not present

## 2019-12-08 DIAGNOSIS — I10 Essential (primary) hypertension: Secondary | ICD-10-CM | POA: Diagnosis not present

## 2019-12-08 DIAGNOSIS — I25810 Atherosclerosis of coronary artery bypass graft(s) without angina pectoris: Secondary | ICD-10-CM | POA: Diagnosis not present

## 2019-12-08 DIAGNOSIS — R0602 Shortness of breath: Secondary | ICD-10-CM | POA: Diagnosis not present

## 2019-12-08 DIAGNOSIS — E782 Mixed hyperlipidemia: Secondary | ICD-10-CM | POA: Diagnosis not present

## 2019-12-17 DIAGNOSIS — I340 Nonrheumatic mitral (valve) insufficiency: Secondary | ICD-10-CM | POA: Diagnosis not present

## 2019-12-17 DIAGNOSIS — E782 Mixed hyperlipidemia: Secondary | ICD-10-CM | POA: Diagnosis not present

## 2019-12-17 DIAGNOSIS — I25810 Atherosclerosis of coronary artery bypass graft(s) without angina pectoris: Secondary | ICD-10-CM | POA: Diagnosis not present

## 2019-12-17 DIAGNOSIS — I10 Essential (primary) hypertension: Secondary | ICD-10-CM | POA: Diagnosis not present

## 2019-12-17 DIAGNOSIS — I2510 Atherosclerotic heart disease of native coronary artery without angina pectoris: Secondary | ICD-10-CM | POA: Diagnosis not present

## 2019-12-17 DIAGNOSIS — R079 Chest pain, unspecified: Secondary | ICD-10-CM | POA: Diagnosis not present

## 2019-12-29 ENCOUNTER — Other Ambulatory Visit: Payer: Self-pay

## 2019-12-29 ENCOUNTER — Encounter (INDEPENDENT_AMBULATORY_CARE_PROVIDER_SITE_OTHER): Payer: Self-pay | Admitting: Vascular Surgery

## 2019-12-29 ENCOUNTER — Ambulatory Visit (INDEPENDENT_AMBULATORY_CARE_PROVIDER_SITE_OTHER): Payer: Medicare HMO

## 2019-12-29 ENCOUNTER — Ambulatory Visit (INDEPENDENT_AMBULATORY_CARE_PROVIDER_SITE_OTHER): Payer: Medicare HMO | Admitting: Vascular Surgery

## 2019-12-29 VITALS — BP 97/65 | HR 86 | Resp 18 | Ht 62.0 in | Wt 116.0 lb

## 2019-12-29 DIAGNOSIS — I10 Essential (primary) hypertension: Secondary | ICD-10-CM

## 2019-12-29 DIAGNOSIS — E7800 Pure hypercholesterolemia, unspecified: Secondary | ICD-10-CM

## 2019-12-29 DIAGNOSIS — I6523 Occlusion and stenosis of bilateral carotid arteries: Secondary | ICD-10-CM | POA: Diagnosis not present

## 2019-12-29 DIAGNOSIS — I714 Abdominal aortic aneurysm, without rupture, unspecified: Secondary | ICD-10-CM

## 2019-12-29 NOTE — Assessment & Plan Note (Signed)
Mild at last check, checking annually

## 2019-12-29 NOTE — Patient Instructions (Signed)
Abdominal Aortic Aneurysm  An aneurysm is a bulge in one of the blood vessels that carry blood away from the heart (artery). It happens when blood pushes up against a weak or damaged place in the wall of an artery. An abdominal aortic aneurysm happens in the main artery of the body (aorta). Some aneurysms may not cause problems. If it grows, it can burst or tear, causing bleeding inside the body. This is an emergency. It needs to be treated right away. What are the causes? The exact cause of this condition is not known. What increases the risk? The following may make you more likely to get this condition:  Being a female who is 60 years of age or older.  Being white (Caucasian).  Using tobacco.  Having a family history of aneurysms.  Having the following conditions: ? Hardening of the arteries (arteriosclerosis). ? Inflammation of the walls of an artery (arteritis). ? Certain genetic conditions. ? Being very overweight (obesity). ? An infection in the wall of the aorta (infectious aortitis). ? High cholesterol. ? High blood pressure (hypertension). What are the signs or symptoms? Symptoms depend on the size of the aneurysm and how fast it is growing. Most grow slowly and do not cause any symptoms. If symptoms do occur, they may include:  Pain in the belly (abdomen), side, or back.  Feeling full after eating only small amounts of food.  Feeling a throbbing lump in the belly. Symptoms that the aneurysm has burst (ruptured) include:  Sudden, very bad pain in the belly, side, or back.  Feeling sick to your stomach (nauseous).  Throwing up (vomiting).  Feeling light-headed or passing out. How is this treated? Treatment for this condition depends on:  The size of the aneurysm.  How fast it is growing.  Your age.  Your risk of having it burst. If your aneurysm is smaller than 2 inches (5 cm), your doctor may manage it by:  Checking it often to see if it is getting bigger.  You may have an imaging test (ultrasound) to check it every 3-6 months, every year, or every few years.  Giving you medicines to: ? Control blood pressure. ? Treat pain. ? Fight infection. If your aneurysm is larger than 2 inches (5 cm), you may need surgery to fix it. Follow these instructions at home: Lifestyle  Do not use any products that have nicotine or tobacco in them. This includes cigarettes, e-cigarettes, and chewing tobacco. If you need help quitting, ask your doctor.  Get regular exercise. Ask your doctor what types of exercise are best for you. Eating and drinking  Eat a heart-healthy diet. This includes eating plenty of: ? Fresh fruits and vegetables. ? Whole grains. ? Low-fat (lean) protein. ? Low-fat dairy products.  Avoid foods that are high in saturated fat and cholesterol. These foods include red meat and some dairy products.  Do not drink alcohol if: ? Your doctor tells you not to drink. ? You are pregnant, may be pregnant, or are planning to become pregnant.  If you drink alcohol: ? Limit how much you use to:  0-1 drink a day for women.  0-2 drinks a day for men. ? Be aware of how much alcohol is in your drink. In the [place] one drink equals any of these:  One typical bottle of beer (12 oz).  One-half glass of wine (5 oz).  One shot of hard liquor (1 oz). General instructions  Take over-the-counter and prescription medicines only as   told by your doctor.  Keep your blood pressure within normal limits. Ask your doctor what your blood pressure should be.  Have your blood sugar (glucose) level and cholesterol levels checked regularly. Keep your blood sugar level and cholesterol levels within normal limits.  Avoid heavy lifting and activities that take a lot of effort. Ask your doctor what activities are safe for you.  Keep all follow-up visits as told by your doctor. This is important. ? Talk to your doctor about regular screenings to see if the  aneurysm is getting bigger. Contact a doctor if you:  Have pain in your belly, side, or back.  Have a throbbing feeling in your belly.  Have a family history of aneurysms. Get help right away if you:  Have sudden, bad pain in your belly, side, or back.  Feel sick to your stomach.  Throw up.  Have trouble pooping (constipation).  Have trouble peeing (urinating).  Feel light-headed.  Have a fast heart rate when you stand.  Have sweaty skin that is cold to the touch (clammy).  Have shortness of breath.  Have a fever. These symptoms may be an emergency. Do not wait to see if the symptoms will go away. Get medical help right away. Call your local emergency services (911 in the [place] Do not drive yourself to the hospital. Summary  An aneurysm is a bulge in one of the blood vessels that carry blood away from the heart (artery). Some aneurysms may not cause problems.  You may need to have yours checked often. If it grows, it can burst or tear. This causes bleeding inside the body. It needs to be treated right away.  Follow instructions from your doctor about healthy lifestyle changes.  Keep all follow-up visits as told by your doctor. This is important. This information is not intended to replace advice given to you by your health care provider. Make sure you discuss any questions you have with your health care provider. Document Revised: 02/23/2019 Document Reviewed: 06/14/2018 Elsevier Patient Education  2020 Elsevier Inc.  

## 2019-12-29 NOTE — Progress Notes (Signed)
MRN : [id number]  Melissa Montes is a 64 y.o. ([birth year]) female who presents with chief complaint of  Chief Complaint  Patient presents with  . Follow-up  .  History of Present Illness: Patient returns today in follow up of abdominal aortic aneurysm today.  She is doing well.  She has had some mild left lower quadrant pain particularly palpation and does have a history of diverticulitis.  No back pain or signs of peripheral embolization.  Her duplex today shows no growth in her abdominal aortic aneurysm measuring 4.0 cm in maximal diameter today.  Current Outpatient Medications  Medication Sig Dispense Refill  . aspirin EC 81 MG tablet Take 162 mg by mouth every evening.     Marland Kitchen atorvastatin (LIPITOR) 80 MG tablet Take 80 mg by mouth every evening.     . Cholecalciferol (VITAMIN D3) 1.25 MG (50000 UT) CAPS Take 1 tablet by mouth once a week.    . diazepam (VALIUM) 10 MG tablet Take 10 mg by mouth daily as needed for anxiety.    Marland Kitchen ezetimibe (ZETIA) 10 MG tablet Take 10 mg by mouth daily.     . metoprolol succinate (TOPROL-XL) 25 MG 24 hr tablet Take 50 mg by mouth daily.     . Multiple Vitamin (MULTIVITAMIN) tablet Take 1 tablet by mouth daily. Centrum Silver for Women    . nitroGLYCERIN (NITROSTAT) 0.4 MG SL tablet Place 0.4 mg under the tongue every 5 (five) minutes x 3 doses as needed for chest pain.     Marland Kitchen ondansetron (ZOFRAN) 4 MG tablet Take 1 tablet by mouth 4 (four) times daily.    . SYMBICORT 80-4.5 MCG/ACT inhaler SMARTSIG:1 Puff(s) By Mouth Every 12 Hours    . traMADol HCl 100 MG TABS Take 1 tablet by mouth 2 (two) times daily as needed.    . traZODone (DESYREL) 100 MG tablet Take 100 mg by mouth at bedtime.     . baclofen (LIORESAL) 10 MG tablet Take 10 mg by mouth daily as needed for muscle spasms.    . celecoxib (CELEBREX) 200 MG capsule Take 200 mg by mouth daily.    Marland Kitchen gabapentin (NEURONTIN) 100 MG capsule Take 300 mg by mouth 3 (three) times daily.   2   No current  facility-administered medications for this visit.    Past Medical History:  Diagnosis Date  . Anxiety   . Bipolar disorder ([place])   . Depression   . Heart disease   . Hyperlipidemia   . MI (myocardial infarction) [facility])     Past Surgical History:  Procedure Laterality Date  . ABDOMINAL HYSTERECTOMY    . CAROTID ANGIOGRAPHY N/A 01/13/2018   Procedure: CAROTID ANGIOGRAPHY;  Surgeon: Algernon Huxley, MD;  Location: [facility];  Service: Cardiovascular;  Laterality: N/A;  . CORONARY ANGIOPLASTY WITH STENT PLACEMENT    . LEFT HEART CATH Right 07/07/2018   Procedure: Left Heart Cath and Coronary Angiography;  Surgeon: Dionisio David, MD;  Location: [place] [facility];  Service: Cardiovascular;  Laterality: Right;     Social History   Tobacco Use  . Smoking status: Former Smoker    Packs/day: 1.00    Years: 41.00    Pack years: 41.00    Types: Cigarettes    Quit date: 09/19/2018    Years since quitting: 1.2  . Smokeless tobacco: Never Used  Substance Use Topics  . Alcohol use: No  . Drug use: No    Family  History  Problem Relation Age of Onset  . Breast cancer Mother 2  . Breast cancer Maternal Aunt        mat aunt and great aunt     Allergies  Allergen Reactions  . Keflex [Cephalexin] Hives    REVIEW OF SYSTEMS(Negative unless checked)  Constitutional: [] ????Weight loss[] ????Fever[] ????Chills Cardiac:[] ????Chest pain[] ????Chest pressure[x] ????Palpitations [] ????Shortness of breath when laying flat [] ????Shortness of breath at rest [x] ????Shortness of breath with exertion. Vascular: [] ????Pain in legs with walking[] ????Pain in legsat rest[] ????Pain in legs when laying flat [x] ????Claudication [] ????Pain in feet when walking [] ????Pain in feet at rest [] ????Pain in feet when laying flat [] ????History of DVT [] ????Phlebitis [] ????Swelling in legs [] ????Varicose veins [] ????Non-healing ulcers Pulmonary: [] ????Uses home  oxygen [] ????Productive cough[] ????Hemoptysis [] ????Wheeze [] ????COPD [] ????Asthma Neurologic: [x] ????Dizziness [] ????Blackouts [] ????Seizures [] ????History of stroke [] ????History of TIA[] ????Aphasia [] ????Temporary blindness[] ????Dysphagia [] ????Weaknessor numbness in arms [] ????Weakness or numbnessin legs Musculoskeletal: [] ????Arthritis [] ????Joint swelling [] ????Joint pain [] ????Low back pain Hematologic:[] ????Easy bruising[] ????Easy bleeding [] ????Hypercoagulable state [] ????Anemic  Gastrointestinal:[] ????Blood in stool[] ????Vomiting blood[x] ????Gastroesophageal reflux/heartburn[] ????Abdominal pain Genitourinary: [] ????Chronic kidney disease [] ????Difficulturination [] ????Frequenturination [] ????Burning with urination[] ????Hematuria Skin: [] ????Rashes [] ????Ulcers [] ????Wounds Psychological: [x] ????History of anxiety[x] ????History of major depression.  Physical Examination  BP 97/65 (BP Location: Right Arm)   Pulse 86   Resp 18   Ht 5\' 2"  (1.575 m)   Wt 116 lb (52.6 kg)   BMI 21.22 kg/m  Gen:  WD/WN, NAD Head: /AT, No temporalis wasting. Ear/Nose/Throat: Hearing grossly intact, nares w/o erythema or drainage Eyes: Conjunctiva clear. Sclera non-icteric Neck: Supple.  Trachea midline Pulmonary:  Good air movement, no use of accessory muscles.  Cardiac: RRR, no JVD Vascular:  Vessel Right Left  Radial Palpable Palpable                                   Gastrointestinal: soft, left lower quadrant is somewhat tender to palpation. Musculoskeletal: M/S 5/5 throughout.  No deformity or atrophy.  No edema. Neurologic: Sensation grossly intact in extremities.  Symmetrical.  Speech is fluent.  Psychiatric: Judgment intact, Mood & affect appropriate for pt's clinical situation. Dermatologic: No rashes or ulcers noted.  No cellulitis or open wounds.       Labs No results found for this or any  previous visit (from the past 2160 hour(s)).  Radiology No results found.  Assessment/Plan  Pure hypercholesterolemia lipid control important in reducing the progression of atherosclerotic disease. Continue statin therapy   Benign essential hypertension blood pressure control important in reducing the progression of atherosclerotic disease. On appropriate oral medications.   Bilateral carotid artery stenosis Mild at last check, checking annually  AAA (abdominal aortic aneurysm) without rupture (HCC) Her duplex today shows no growth in her abdominal aortic aneurysm measuring 4.0 cm in maximal diameter today.  No role for repair at this size.  We will continue to follow this on 71-month intervals.  She will contact our office with any problems in the interim.    Leotis Pain, MD  12/29/2019 10:53 AM    This note was created with Dragon medical transcription system.  Any errors from dictation are purely unintentional

## 2019-12-29 NOTE — Assessment & Plan Note (Signed)
blood pressure control important in reducing the progression of atherosclerotic disease. On appropriate oral medications.  

## 2019-12-29 NOTE — Assessment & Plan Note (Signed)
lipid control important in reducing the progression of atherosclerotic disease. Continue statin therapy  

## 2019-12-29 NOTE — Assessment & Plan Note (Signed)
Her duplex today shows no growth in her abdominal aortic aneurysm measuring 4.0 cm in maximal diameter today.  No role for repair at this size.  We will continue to follow this on 72-month intervals.  She will contact our office with any problems in the interim.

## 2020-01-11 DIAGNOSIS — Z1152 Encounter for screening for COVID-19: Secondary | ICD-10-CM | POA: Diagnosis not present

## 2020-01-11 DIAGNOSIS — Z20822 Contact with and (suspected) exposure to covid-19: Secondary | ICD-10-CM | POA: Diagnosis not present

## 2020-01-11 DIAGNOSIS — R197 Diarrhea, unspecified: Secondary | ICD-10-CM | POA: Diagnosis not present

## 2020-01-11 DIAGNOSIS — R112 Nausea with vomiting, unspecified: Secondary | ICD-10-CM | POA: Diagnosis not present

## 2020-01-13 DIAGNOSIS — B370 Candidal stomatitis: Secondary | ICD-10-CM | POA: Diagnosis not present

## 2020-01-13 DIAGNOSIS — R197 Diarrhea, unspecified: Secondary | ICD-10-CM | POA: Diagnosis not present

## 2020-01-13 DIAGNOSIS — K2970 Gastritis, unspecified, without bleeding: Secondary | ICD-10-CM | POA: Diagnosis not present

## 2020-01-13 DIAGNOSIS — B373 Candidiasis of vulva and vagina: Secondary | ICD-10-CM | POA: Diagnosis not present

## 2020-01-20 DIAGNOSIS — Z0100 Encounter for examination of eyes and vision without abnormal findings: Secondary | ICD-10-CM | POA: Diagnosis not present

## 2020-01-20 DIAGNOSIS — H524 Presbyopia: Secondary | ICD-10-CM | POA: Diagnosis not present

## 2020-01-25 NOTE — Progress Notes (Signed)
Patient: Melissa Montes  Service Category: E/M  Provider: Gillis Santa, MD  DOB: 1956/03/14  DOS: 01/26/2020  Location: Office  MRN: [id number]  Setting: Ambulatory outpatient  Referring Provider: Harvest Dark, FNP  Type: New Patient  Specialty: Interventional Pain Management  PCP: Perrin Maltese, MD  Location: Home  Delivery: TeleHealth     Virtual Encounter - Pain Management PROVIDER NOTE: Information contained herein reflects review and annotations entered in association with encounter. Interpretation of such information and data should be left to medically-trained personnel. Information provided to patient can be located elsewhere in the medical record under "Patient Instructions". Document created using STT-dictation technology, any transcriptional errors that may result from process are unintentional.    Contact & Pharmacy Preferred: [phone] Home: [phone] (home) Mobile: [phone] (mobile) E-mail: [email or web address] [address] [place], [place] [place] [address] Phone: [phone] Fax: [phone]   Pre-screening note:  Our staff contacted Melissa Montes and offered her an "in person", "face-to-face" appointment versus a telephone encounter. She indicated preferring the telephone encounter, at this time.  Primary Reason(s) for Visit: Tele-Encounter for initial evaluation of one or more chronic problems (new to examiner) potentially causing chronic pain, and posing a threat to normal musculoskeletal function. (Level of risk: High) CC: low back pain  I contacted Melissa Montes on 01/26/2020 via telephone.      I clearly identified myself as Gillis Santa, MD. I verified that I was speaking with the correct person using two identifiers (Name: Melissa Montes, and date of birth: 1956/10/13).  This visit was completed via telephone due to the restrictions of the COVID-19 pandemic.  All issues as above were discussed and addressed but no physical exam was performed. If it was felt that the patient should be evaluated in the office, they were directed there. The patient verbally consented to this visit. Patient was unable to complete an audio/visual visit due to Technical difficulties and/or Lack of internet. Due to the catastrophic nature of the COVID-19 pandemic, this visit was done through audio contact only.  Location of the patient: home address (see Epic for details)  Location of the provider: office  Advanced Informed Consent I sought verbal advanced consent from Melissa Montes for virtual visit interactions. I informed Melissa Montes of possible security and privacy concerns, risks, and limitations associated with providing "not-in-person" medical evaluation and management services. I also informed Melissa Montes of the availability of "in-person" appointments. Finally, I informed her that there would be a charge for the virtual visit and that she could be  personally, fully or partially, financially responsible for it. Melissa Montes expressed understanding and agreed to proceed.   HPI  Melissa Montes is a 64 y.o. year old, female patient, contacted today for an initial evaluation of her chronic pain. She has Uninsured; Unemployed; Personal history of tobacco use, presenting hazards to health; SVT (supraventricular tachycardia) ([place]); S/P coronary artery stent placement; Pure hypercholesterolemia; Primary insomnia; Peripheral vascular disease ([place]); Osteopenia; Oral herpes; Nephrolithiasis; Mixed hyperlipidemia; GAD (generalized anxiety disorder); COPD (chronic obstructive pulmonary disease) ([place]); Centrilobular emphysema ([place]); CAD (coronary artery disease); Bipolar 1 disorder ([place]); Thrombosis of abdominal aorta ([place]); Angular cheilitis; ADD (attention deficit disorder); Bilateral carotid artery stenosis; AAA (abdominal aortic aneurysm) without rupture ([place]); Unstable angina  ([place]); Elevated alkaline phosphatase level; MGUS (monoclonal gammopathy of unknown significance); Benign essential hypertension; S/P coronary artery bypass graft  x 2; Osteoporosis, post-menopausal; and Unspecified injury of left internal jugular vein, subsequent encounter on their problem list.   Onset and Duration: Gradual 10 years ago Cause of pain: Unknown Severity: Getting worse, NAS-11 at its worse: 9/10, NAS-11 at its best: 4/10, NAS-11 now: 7/10 and NAS-11 on the average: 7/10 Timing: During activity or exercise, After activity or exercise and After a period of immobility Aggravating Factors: Bending, Climbing, Lifiting, Motion, Prolonged sitting, Prolonged standing, Twisting, Walking and Walking uphill Alleviating Factors: Cold packs, Hot packs and Nerve blocks Associated Problems: Depression, Dizziness, Fatigue, Nausea, Weakness and Pain that wakes patient up Quality of Pain: Aching, Deep, Feeling of weight, Sharp, Shooting, Toothache-like and Uncomfortable Previous Examinations or Tests: MRI scan, X-rays, Orthopedic evaluation, Chiropractic evaluation and Psychiatric evaluation Previous Treatments: Epidural steroid injections, Narcotic medications and Stretching exercises  Patient is a very pleasant 64 year old female with a history of low back pain related to lumbar facet arthropathy and lumbar spondylosis.  Patient is being referred from [place] for diagnostic medial branch nerve block #2 and possible radiofrequency ablation to be done under sedation.  She previously had a bilateral L3, L4, L5 medial branch nerve block performed on 12/01/2019 which provided 100% pain relief for 7 hours.  However the injection proved difficult to tolerate due to procedural discomfort and pain.  She is being referred here to have second diagnostic set and possible RFA to be done under sedation.  Meds   Current Outpatient Medications:  .  aspirin EC 81 MG tablet, Take 162 mg by mouth every  evening. , Disp: , Rfl:  .  atorvastatin (LIPITOR) 80 MG tablet, Take 80 mg by mouth every evening. , Disp: , Rfl:  .  diazepam (VALIUM) 10 MG tablet, Take 10 mg by mouth daily as needed for anxiety., Disp: , Rfl:  .  ezetimibe (ZETIA) 10 MG tablet, Take 10 mg by mouth daily. , Disp: , Rfl:  .  metoprolol succinate (TOPROL-XL) 25 MG 24 hr tablet, Take 50 mg by mouth daily. , Disp: , Rfl:  .  Multiple Vitamin (MULTIVITAMIN) tablet, Take 1 tablet by mouth daily. Centrum Silver for Women, Disp: , Rfl:  .  nitroGLYCERIN (NITROSTAT) 0.4 MG SL tablet, Place 0.4 mg under the tongue every 5 (five) minutes x 3 doses as needed for chest pain. , Disp: , Rfl:  .  promethazine (PHENERGAN) 25 MG tablet, Take 25 mg by mouth every 8 (eight) hours as needed for nausea or vomiting., Disp: , Rfl:  .  sucralfate (CARAFATE) 1 g tablet, Take 1 g by mouth 4 (four) times daily -  with meals and at bedtime., Disp: , Rfl:  .  SYMBICORT 80-4.5 MCG/ACT inhaler, SMARTSIG:1 Puff(s) By Mouth Every 12 Hours, Disp: , Rfl:  .  traMADol (ULTRAM) 50 MG tablet, Take 100 mg by mouth every 12 (twelve) hours as needed., Disp: , Rfl:  .  traZODone (DESYREL) 100 MG tablet, Take 100 mg by mouth at bedtime. , Disp: , Rfl:  .  baclofen (LIORESAL) 10 MG tablet, Take 10 mg by mouth daily as needed for muscle spasms., Disp: , Rfl:  .  celecoxib (CELEBREX) 200 MG capsule, Take 200 mg by mouth daily., Disp: , Rfl:  .  Cholecalciferol (VITAMIN D3) 1.25 MG (50000 UT) CAPS, Take 1 tablet by mouth once a week., Disp: , Rfl:  .  gabapentin (NEURONTIN) 100 MG capsule, Take 300 mg by mouth 3 (three) times daily. , Disp: , Rfl: 2 .  ondansetron (  ZOFRAN) 4 MG tablet, Take 1 tablet by mouth 4 (four) times daily., Disp: , Rfl:  .  traMADol HCl 100 MG TABS, Take 1 tablet by mouth 2 (two) times daily as needed., Disp: , Rfl:   ROS  Cardiovascular: Daily Aspirin intake and Heart surgery bypass 2019 Pulmonary or Respiratory: Difficulty blowing air out  (Emphysema) and Smoking quit smoking in 2020 Neurological: No reported neurological signs or symptoms such as seizures, abnormal skin sensations, urinary and/or fecal incontinence, being born with an abnormal open spine and/or a tethered spinal cord Psychological-Psychiatric: Anxiousness and Depressed Gastrointestinal: Alternating episodes iof diarrhea and constipation (IBS-Irritable bowe syndrome) Diverticulitis Genitourinary: No reported renal or genitourinary signs or symptoms such as difficulty voiding or producing urine, peeing blood, non-functioning kidney, kidney stones, difficulty emptying the bladder, difficulty controlling the flow of urine, or chronic kidney disease Hematological: No reported hematological signs or symptoms such as prolonged bleeding, low or poor functioning platelets, bruising or bleeding easily, hereditary bleeding problems, low energy levels due to low hemoglobin or being anemic Endocrine: No reported endocrine signs or symptoms such as high or low blood sugar, rapid heart rate due to high thyroid levels, obesity or weight gain due to slow thyroid or thyroid disease Rheumatologic: Joint aches and or swelling due to excess weight (Osteoarthritis) Musculoskeletal: Negative for myasthenia gravis, muscular dystrophy, multiple sclerosis or malignant hyperthermia Work History: Disabled since 2010 due to bipolar  Allergies  Melissa Montes is allergic to penicillins and keflex [cephalexin].  Laboratory Chemistry Profile   Renal Lab Results  Component Value Date   BUN 22 03/31/2019   CREATININE 0.67 03/31/2019   GFRAA >60 03/31/2019   GFRNONAA >60 03/31/2019   SPECGRAV 1.015 06/18/2017   PHUR 6.0 06/18/2017   PROTEINUR Negative 06/18/2017    Electrolytes Lab Results  Component Value Date   NA 140 03/31/2019   K 4.8 03/31/2019   CL 104 03/31/2019   CALCIUM 9.5 03/31/2019    Hepatic Lab Results  Component Value Date   AST 25 03/31/2019   ALT 21 03/31/2019    ALBUMIN 4.2 03/31/2019   ALKPHOS 101 03/31/2019    ID No results found for: LYMEIGGIGMAB, HIV, Anon Raices, STAPHAUREUS, MRSAPCR, HCVAB, PREGTESTUR, RMSFIGG, QFVRPH1IGG, QFVRPH2IGG, LYMEIGGIGMAB  Bone No results found for: VD25OH, IB704UG8BVQ, XI5038UE2, CM0349ZP9, 25OHVITD1, 25OHVITD2, 25OHVITD3, TESTOFREE, TESTOSTERONE  Endocrine Lab Results  Component Value Date   GLUCOSE 109 (H) 03/31/2019   GLUCOSEU Negative 06/18/2017    Neuropathy No results found for: VITAMINB12, FOLATE, HGBA1C, HIV  CNS No results found for: COLORCSF, APPEARCSF, RBCCOUNTCSF, WBCCSF, POLYSCSF, LYMPHSCSF, EOSCSF, PROTEINCSF, GLUCCSF, JCVIRUS, CSFOLI, IGGCSF, LABACHR, ACETBL, LABACHR, ACETBL  Inflammation (CRP: Acute  ESR: Chronic) No results found for: CRP, ESRSEDRATE, LATICACIDVEN  Rheumatology No results found for: RF, ANA, LABURIC, URICUR, LYMEIGGIGMAB, LYMEABIGMQN, HLAB27  Coagulation Lab Results  Component Value Date   PLT 234 03/31/2019    Cardiovascular Lab Results  Component Value Date   TROPONINI <0.03 10/14/2016   HGB 13.9 03/31/2019   HCT 41.2 03/31/2019    Screening No results found for: SARSCOV2NAA, COVIDSOURCE, STAPHAUREUS, MRSAPCR, HCVAB, HIV, PREGTESTUR  Cancer No results found for: CEA, CA125, LABCA2  Allergens No results found for: ALMOND, APPLE, ASPARAGUS, AVOCADO, BANANA, BARLEY, BASIL, BAYLEAF, GREENBEAN, LIMABEAN, WHITEBEAN, BEEFIGE, REDBEET, BLUEBERRY, BROCCOLI, CABBAGE, MELON, CARROT, CASEIN, CASHEWNUT, CAULIFLOWER, CELERY    Note: Lab results reviewed.   Imaging Review    Lumbosacral Imaging: Lumbar MR wo contrast:  Results for orders placed during the hospital encounter of 04/29/19  MR LUMBAR SPINE WO CONTRAST   Narrative CLINICAL DATA:  Low back and bilateral hip pain.  No known injury.  EXAM: MRI LUMBAR SPINE WITHOUT CONTRAST  TECHNIQUE: Multiplanar, multisequence MR imaging of the lumbar spine was performed. No intravenous contrast was administered.  COMPARISON:   None.  FINDINGS: Segmentation: There are five lumbar type vertebral bodies. The last full intervertebral disc space is labeled L5-S1.  Alignment:  Normal  Vertebrae:  Normal marrow signal.  No bone lesions or fracture.  Conus medullaris and cauda equina: Conus extends to the T12-L1 level. Conus and cauda equina appear normal.  Paraspinal and other soft tissues: Age advanced atherosclerotic changes involving the aorta. There is a distal abdominal aortic aneurysm measuring at least 4 cm. This measured 2.3 cm on the prior CT scan from 2013.  Disc levels:  T12-L1: Mild bulging annulus and shallow central and slightly left paracentral disc protrusion with mild mass effect on the ventral thecal sac. No foraminal stenosis.  L1-2: Slight bulging annulus and small annular fissure but no spinal, lateral recess or foraminal stenosis.  L2-3: No significant findings.  L3-4: Mild facet disease and slight bulging annulus with minimal lateral recess encroachment bilaterally but no spinal or foraminal stenosis.  L4-5: Annular bulge and mild to moderate facet disease with mild bilateral lateral recess stenosis. There is also a shallow lateral foraminal and extraforaminal disc protrusion but no direct mass effect on the exiting left L4 nerve root.  L5-S1: No significant findings.  IMPRESSION: 1. **An incidental finding of potential clinical significance has been found. 4 cm abdominal aortic aneurysm just above the iliac artery bifurcation. This is not completely visualized and given the patient's age and growth since prior CT, I would recommend a dedicated CTA abdomen/pelvis for more complete evaluation. ** 2. Mild bulging annulus and small central and slightly left paracentral disc protrusion at T12-L1. 3. Minimal bilateral lateral recess encroachment at L3-4. 4. Mild to moderate bilateral lateral recess stenosis at L4-5. There is also a shallow left foraminal/extraforaminal disc  protrusion but no direct mass effect on the left L4 nerve root. 5. Lower lumbar facet disease but no pars defects.   Electronically Signed   By: Marijo Sanes M.D.   On: 04/30/2019 09:41     Results for orders placed during the hospital encounter of 06/24/19  MR HIP RIGHT WO CONTRAST   Narrative CLINICAL DATA:  Bilateral hip pain for 1 year, right greater than left.  EXAM: MR OF THE RIGHT HIP WITHOUT CONTRAST  TECHNIQUE: Multiplanar, multisequence MR imaging was performed. No intravenous contrast was administered.  COMPARISON:  None.  FINDINGS: Bones: Normal.  Articular cartilage and labrum  Articular cartilage:  Normal.  Labrum:  Normal.  Joint or bursal effusion  Joint effusion:  None  Bursae: No bursitis.  Muscles and tendons  Muscles and tendons:  Normal.  Other findings  Miscellaneous: No adenopathy or mass lesions. Uterus is been removed. Multiple tiny cysts in both ovaries.  IMPRESSION: Essentially normal MRI of the right hip.   Electronically Signed   By: Lorriane Shire M.D.   On: 06/24/2019 19:41     Complexity Note: Imaging results reviewed. Results shared with Melissa Montes, using Layman's terms.                         PFSH  Drug: Melissa Montes  reports no history of drug use. Alcohol:  reports no history of alcohol use. Tobacco:  reports that she  quit smoking about 16 months ago. Her smoking use included cigarettes. She has a 41.00 pack-year smoking history. She has never used smokeless tobacco. Medical:  has a past medical history of Anxiety, Bipolar disorder ([place]), Depression, Heart disease, Hyperlipidemia, and MI (myocardial infarction) ([place]). Family: family history includes Breast cancer in her maternal aunt; Breast cancer (age of onset: 38) in her mother.  Past Surgical History:  Procedure Laterality Date  . ABDOMINAL HYSTERECTOMY    . CAROTID ANGIOGRAPHY N/A 01/13/2018   Procedure: CAROTID ANGIOGRAPHY;  Surgeon: Algernon Huxley,  MD;  Location: [facility];  Service: Cardiovascular;  Laterality: N/A;  . CORONARY ANGIOPLASTY WITH STENT PLACEMENT    . LEFT HEART CATH Right 07/07/2018   Procedure: Left Heart Cath and Coronary Angiography;  Surgeon: Dionisio David, MD;  Location: [facility];  Service: Cardiovascular;  Laterality: Right;   Active Ambulatory Problems    Diagnosis Date Noted  . Uninsured 08/11/2011  . Unemployed 08/11/2011  . Personal history of tobacco use, presenting hazards to health 06/18/2017  . SVT (supraventricular tachycardia) ([place]) 06/18/2017  . S/P coronary artery stent placement 10/10/2015  . Pure hypercholesterolemia 10/10/2015  . Primary insomnia 10/10/2015  . Peripheral vascular disease ([place]) 06/18/2017  . Osteopenia 06/18/2017  . Oral herpes 06/18/2017  . Nephrolithiasis 08/11/2011  . Mixed hyperlipidemia 08/09/2011  . GAD (generalized anxiety disorder) 12/06/2015  . COPD (chronic obstructive pulmonary disease) ([place]) 06/18/2017  . Centrilobular emphysema ([place]) 10/10/2015  . CAD (coronary artery disease) 06/18/2017  . Bipolar 1 disorder ([place]) 10/10/2015  . Thrombosis of abdominal aorta ([place]) 06/18/2017  . Angular cheilitis 10/18/2015  . ADD (attention deficit disorder) 06/18/2017  . Bilateral carotid artery stenosis 12/24/2017  . AAA (abdominal aortic aneurysm) without rupture ([place]) 01/31/2018  . Unstable angina ([place]) 07/04/2018  . Elevated alkaline phosphatase level 10/01/2018  . MGUS (monoclonal gammopathy of unknown significance) 10/01/2018  . Benign essential hypertension 11/15/2011  . S/P coronary artery bypass graft x 2 07/17/2018  . Osteoporosis, post-menopausal 11/05/2018  . Unspecified injury of left internal jugular vein, subsequent encounter 05/15/2019   Resolved Ambulatory Problems    Diagnosis Date Noted  . No Resolved Ambulatory Problems   Past Medical History:  Diagnosis Date  . Anxiety   . Bipolar disorder ([place])   . Depression   . Heart  disease   . Hyperlipidemia   . MI (myocardial infarction) [facility])    Assessment  Primary Diagnosis & Pertinent Problem List: The primary encounter diagnosis was Lumbar facet arthropathy. Diagnoses of Lumbar spondylosis, Lumbar degenerative disc disease, and Chronic pain syndrome were also pertinent to this visit.  Visit Diagnosis (New problems to examiner): 1. Lumbar facet arthropathy   2. Lumbar spondylosis   3. Lumbar degenerative disc disease   4. Chronic pain syndrome    Plan of Melissa Montes has a history of greater than 3 months of moderate to severe pain which is resulted in functional impairment.  The patient has tried various conservative therapeutic options such as NSAIDs, Tylenol, muscle relaxants, physical therapy which was inadequately effective.  Patient's pain is predominantly axial with clinical findings suggestive of facet arthropathy.  Lumbar facet medial branch nerve blocks #2 were discussed with the patient.  She previously had a bilateral L3, L4, L5 medial branch nerve block performed on 12/01/2019 which provided 100% pain relief for 7 hours. However the injection proved difficult to tolerate due to procedural discomfort and pain.  She is being  referred here to have second diagnostic set and possible RFA to be done under sedation.  Risks and benefits were reviewed.  Patient would like to proceed with bilateral L3, L4, L5 medial branch nerve block #2 with sedation.   Orders Placed This Encounter  Procedures  . LUMBAR FACET(MEDIAL BRANCH NERVE BLOCK) MBNB    Standing Status:   Future    Standing Expiration Date:   02/26/2020    Scheduling Instructions:     Procedure: Lumbar facet block (AKA.: Lumbosacral medial branch nerve block)     Side: Bilateral     Level:L4-5, & L5-S1 Facets ( L3, L4, L5,  Medial Branch Nerves)     Sedation: WITH     Timeframe: ASAA    Order Specific Question:   Where will this procedure be performed?    Answer:   [facility]    Provider-requested follow-up: Return in about 1 week (around 02/02/2020) for B/L L3, 4, 5 MBNB #2 (#1 done at [facility]), with sedation.  Future Appointments  Date Time Provider [place]  02/23/2020  1:00 PM [facility] VASC 1 [facility] None  02/23/2020  2:00 PM Dew, Erskine Squibb, MD [facility] None  04/05/2020 10:00 AM [facility] LAB [facility] None  04/12/2020  1:45 PM Cammie Sickle, MD [facility] None  07/01/2020  8:30 AM [facility] VASC 2 [facility] None  07/01/2020  9:30 AM [facility] VASC 2 [facility] None  07/01/2020 10:45 AM Dew, Erskine Squibb, MD [facility] None   Total duration of encounter: 45 minutes.  Primary Care Physician: Perrin Maltese, MD Note by: Gillis Santa, MD Date: 01/26/2020; Time: 11:04 AM

## 2020-01-26 ENCOUNTER — Other Ambulatory Visit: Payer: Self-pay

## 2020-01-26 ENCOUNTER — Ambulatory Visit
Payer: Medicare HMO | Attending: Student in an Organized Health Care Education/Training Program | Admitting: Student in an Organized Health Care Education/Training Program

## 2020-01-26 VITALS — Ht 62.0 in | Wt 115.0 lb

## 2020-01-26 DIAGNOSIS — G894 Chronic pain syndrome: Secondary | ICD-10-CM | POA: Diagnosis not present

## 2020-01-26 DIAGNOSIS — M47816 Spondylosis without myelopathy or radiculopathy, lumbar region: Secondary | ICD-10-CM | POA: Diagnosis not present

## 2020-01-26 DIAGNOSIS — M5136 Other intervertebral disc degeneration, lumbar region: Secondary | ICD-10-CM

## 2020-01-26 DIAGNOSIS — M51369 Other intervertebral disc degeneration, lumbar region without mention of lumbar back pain or lower extremity pain: Secondary | ICD-10-CM

## 2020-01-29 ENCOUNTER — Telehealth: Payer: Self-pay | Admitting: *Deleted

## 2020-01-29 NOTE — Telephone Encounter (Signed)
(  01/29/20) Left message for pt to notify them that it is time to schedule annual low dose lung cancer screening CT scan. Instructed patient to call back to verify information prior to the scan being scheduled SR[facility]

## 2020-02-01 ENCOUNTER — Ambulatory Visit
Admission: RE | Admit: 2020-02-01 | Discharge: 2020-02-01 | Disposition: A | Discharge status: Home / Self Care | Payer: Medicare HMO | Source: Ambulatory Visit | Attending: Student in an Organized Health Care Education/Training Program | Admitting: Student in an Organized Health Care Education/Training Program

## 2020-02-01 ENCOUNTER — Other Ambulatory Visit: Payer: Self-pay

## 2020-02-01 ENCOUNTER — Encounter: Payer: Self-pay | Admitting: Student in an Organized Health Care Education/Training Program

## 2020-02-01 ENCOUNTER — Ambulatory Visit (HOSPITAL_BASED_OUTPATIENT_CLINIC_OR_DEPARTMENT_OTHER): Payer: Medicare HMO | Admitting: Student in an Organized Health Care Education/Training Program

## 2020-02-01 DIAGNOSIS — M47816 Spondylosis without myelopathy or radiculopathy, lumbar region: Secondary | ICD-10-CM

## 2020-02-01 MED ORDER — DEXAMETHASONE SODIUM PHOSPHATE 10 MG/ML IJ SOLN
10.0000 mg | Freq: Once | INTRAMUSCULAR | Status: AC
  Administered 2020-02-01: 10 mg
  Filled 2020-02-01: qty 1

## 2020-02-01 MED ORDER — MIDAZOLAM HCL 5 MG/5ML IJ SOLN
1.0000 mg | INTRAMUSCULAR | Status: DC | PRN
  Administered 2020-02-01: 12:00:00 1 mg via INTRAVENOUS
  Filled 2020-02-01: qty 5

## 2020-02-01 MED ORDER — LIDOCAINE HCL 2 % IJ SOLN
20.0000 mL | Freq: Once | INTRAMUSCULAR | Status: DC
  Filled 2020-02-01: qty 40

## 2020-02-01 MED ORDER — FENTANYL CITRATE (PF) 100 MCG/2ML IJ SOLN
25.0000 ug | INTRAMUSCULAR | Status: DC | PRN
  Administered 2020-02-01: 12:00:00 50 ug via INTRAVENOUS
  Filled 2020-02-01: qty 2

## 2020-02-01 MED ORDER — ROPIVACAINE HCL 2 MG/ML IJ SOLN
9.0000 mL | Freq: Once | INTRAMUSCULAR | Status: DC
  Filled 2020-02-01: qty 10

## 2020-02-01 NOTE — Progress Notes (Signed)
PROVIDER NOTE: Information contained herein reflects review and annotations entered in association with encounter. Interpretation of such information and data should be left to medically-trained personnel. Information provided to patient can be located elsewhere in the medical record under "Patient Instructions". Document created using STT-dictation technology, any transcriptional errors that may result from process are unintentional.    Patient: Melissa Montes  Service Category: Procedure  Provider: Gillis Santa, MD  DOB: February 12, 1956  DOS: 02/01/2020  Location: [facility]  MRN: [id number]  Setting: Ambulatory - outpatient  Referring Provider: Gillis Santa, MD  Type: Established Patient  Specialty: Interventional Pain Management  PCP: Perrin Maltese, MD   Primary Reason for Visit: Interventional Pain Management Treatment. CC: Back Pain (bilateral lumbar )  Procedure:          Anesthesia, Analgesia, Anxiolysis:  Type: Lumbar Facet, Medial Branch Block(s) #2 (#1 done at [facility] clinic with Dr. Sharlet Salina provided 100% pain relief for approximately 7-8 hours) Primary Purpose: Diagnostic Region: Posterolateral Lumbosacral Spine Level:L3, L4, L5, Medial Branch Level(s). Injecting these levels blocks the L3-4, L4-5 lumbar facet joints. Laterality: Bilateral  Type: Moderate (Conscious) Sedation combined with Local Anesthesia Indication(s): Analgesia and Anxiety Route: Intravenous (IV) IV Access: Secured Sedation: Meaningful verbal contact was maintained at all times during the procedure  Local Anesthetic: Lidocaine 1-2%  Position: Prone   Indications: 1. Lumbar facet arthropathy    Pain Score: Pre-procedure: 8 /10 Post-procedure: 2 /10   Pre-op Assessment:  Melissa Montes is a 64 y.o. (year old), female patient, seen today for interventional treatment. She  has a past surgical history that includes Coronary angioplasty with stent; Abdominal hysterectomy; CAROTID ANGIOGRAPHY  (N/A, 01/13/2018); and Left Heart Cath (Right, 07/07/2018). Melissa Montes has a current medication list which includes the following prescription(s): aspirin ec, atorvastatin, celecoxib, ezetimibe, metoprolol succinate, multivitamin, nitroglycerin, ondansetron, promethazine, sucralfate, symbicort, tramadol, trazodone, baclofen, vitamin d3, diazepam, duloxetine, gabapentin, nystatin, and tramadol hcl, and the following Facility-Administered Medications: fentanyl, lidocaine, midazolam, ropivacaine (pf) 2 mg/ml (0.2%), and ropivacaine (pf) 2 mg/ml (0.2%). Her primarily concern today is the Back Pain (bilateral lumbar )  Initial Vital Signs:  Pulse/HCG Rate: 90ECG Heart Rate: 88 Temp: (!) 97.3 F (36.3 C) Resp: 16 BP: (!) 144/102 SpO2: 97 %  BMI: Estimated body mass index is 21.03 kg/m as calculated from the following:   Height as of this encounter: 5\' 2"  (1.575 m).   Weight as of this encounter: 115 lb (52.2 kg).  Risk Assessment: Allergies: Reviewed. She is allergic to penicillins and keflex [cephalexin].  Allergy Precautions: None required Coagulopathies: Reviewed. None identified.  Blood-thinner therapy: None at this time Active Infection(s): Reviewed. None identified. Melissa Montes is afebrile  Site Confirmation: Melissa Montes was asked to confirm the procedure and laterality before marking the site Procedure checklist: Completed Consent: Before the procedure and under the influence of no sedative(s), amnesic(s), or anxiolytics, the patient was informed of the treatment options, risks and possible complications. To fulfill our ethical and legal obligations, as recommended by the American Medical Association's Code of Ethics, I have informed the patient of my clinical impression; the nature and purpose of the treatment or procedure; the risks, benefits, and possible complications of the intervention; the alternatives, including doing nothing; the risk(s) and benefit(s) of the alternative  treatment(s) or procedure(s); and the risk(s) and benefit(s) of doing nothing. The patient was provided information about the general risks and possible complications associated with the procedure. These may include, but are not limited to: failure  to achieve desired goals, infection, bleeding, organ or nerve damage, allergic reactions, paralysis, and death. In addition, the patient was informed of those risks and complications associated to Spine-related procedures, such as failure to decrease pain; infection (i.e.: Meningitis, epidural or intraspinal abscess); bleeding (i.e.: epidural hematoma, subarachnoid hemorrhage, or any other type of intraspinal or peri-dural bleeding); organ or nerve damage (i.e.: Any type of peripheral nerve, nerve root, or spinal cord injury) with subsequent damage to sensory, motor, and/or autonomic systems, resulting in permanent pain, numbness, and/or weakness of one or several areas of the body; allergic reactions; (i.e.: anaphylactic reaction); and/or death. Furthermore, the patient was informed of those risks and complications associated with the medications. These include, but are not limited to: allergic reactions (i.e.: anaphylactic or anaphylactoid reaction(s)); adrenal axis suppression; blood sugar elevation that in diabetics may result in ketoacidosis or comma; water retention that in patients with history of congestive heart failure may result in shortness of breath, pulmonary edema, and decompensation with resultant heart failure; weight gain; swelling or edema; medication-induced neural toxicity; particulate matter embolism and blood vessel occlusion with resultant organ, and/or nervous system infarction; and/or aseptic necrosis of one or more joints. Finally, the patient was informed that Medicine is not an exact science; therefore, there is also the possibility of unforeseen or unpredictable risks and/or possible complications that may result in a catastrophic  outcome. The patient indicated having understood very clearly. We have given the patient no guarantees and we have made no promises. Enough time was given to the patient to ask questions, all of which were answered to the patient's satisfaction. Ms. Giudice has indicated that she wanted to continue with the procedure. Attestation: I, the ordering provider, attest that I have discussed with the patient the benefits, risks, side-effects, alternatives, likelihood of achieving goals, and potential problems during recovery for the procedure that I have provided informed consent. Date  Time: 02/01/2020 10:43 AM  Pre-Procedure Preparation:  Monitoring: As per clinic protocol. Respiration, ETCO2, SpO2, BP, heart rate and rhythm monitor placed and checked for adequate function Safety Precautions: Patient was assessed for positional comfort and pressure points before starting the procedure. Time-out: I initiated and conducted the "Time-out" before starting the procedure, as per protocol. The patient was asked to participate by confirming the accuracy of the "Time Out" information. Verification of the correct person, site, and procedure were performed and confirmed by me, the nursing staff, and the patient. "Time-out" conducted as per Joint Commission's Universal Protocol (UP.01.01.01). Time: 1150  Description of Procedure:          Laterality: Bilateral. The procedure was performed in identical fashion on both sides. Levels:   L3, L4, L5, Medial Branch Level(s) Area Prepped: Posterior Lumbosacral Region Prepping solution: DuraPrep (Iodine Povacrylex [0.7% available iodine] and Isopropyl Alcohol, 74% w/w) Safety Precautions: Aspiration looking for blood return was conducted prior to all injections. At no point did we inject any substances, as a needle was being advanced. Before injecting, the patient was told to immediately notify me if she was experiencing any new onset of "ringing in the ears, or metallic  taste in the mouth". No attempts were made at seeking any paresthesias. Safe injection practices and needle disposal techniques used. Medications properly checked for expiration dates. SDV (single dose vial) medications used. After the completion of the procedure, all disposable equipment used was discarded in the proper designated medical waste containers. Local Anesthesia: Protocol guidelines were followed. The patient was positioned over the fluoroscopy table. The area  was prepped in the usual manner. The time-out was completed. The target area was identified using fluoroscopy. A 12-in long, straight, sterile hemostat was used with fluoroscopic guidance to locate the targets for each level blocked. Once located, the skin was marked with an approved surgical skin marker. Once all sites were marked, the skin (epidermis, dermis, and hypodermis), as well as deeper tissues (fat, connective tissue and muscle) were infiltrated with a small amount of a short-acting local anesthetic, loaded on a 10cc syringe with a 25G, 1.5-in  Needle. An appropriate amount of time was allowed for local anesthetics to take effect before proceeding to the next step. Local Anesthetic: Lidocaine 2.0% The unused portion of the local anesthetic was discarded in the proper designated containers. Technical explanation of process:   L3 Medial Branch Nerve Block (MBB): The target area for the L3 medial branch is at the junction of the postero-lateral aspect of the superior articular process and the superior, posterior, and medial edge of the transverse process of L4. Under fluoroscopic guidance, a Quincke needle was inserted until contact was made with os over the superior postero-lateral aspect of the pedicular shadow (target area). After negative aspiration for blood, 32mL of the nerve block solution was injected without difficulty or complication. The needle was removed intact. L4 Medial Branch Nerve Block (MBB): The target area for the L4  medial branch is at the junction of the postero-lateral aspect of the superior articular process and the superior, posterior, and medial edge of the transverse process of L5. Under fluoroscopic guidance, a Quincke needle was inserted until contact was made with os over the superior postero-lateral aspect of the pedicular shadow (target area). After negative aspiration for blood, 1 mL of the nerve block solution was injected without difficulty or complication. The needle was removed intact. L5 Medial Branch Nerve Block (MBB): The target area for the L5 medial branch is at the junction of the postero-lateral aspect of the superior articular process and the superior, posterior, and medial edge of the sacral ala. Under fluoroscopic guidance, a Quincke needle was inserted until contact was made with os over the superior postero-lateral aspect of the pedicular shadow (target area). After negative aspiration for blood, 44mL of the nerve block solution was injected without difficulty or complication. The needle was removed intact.  Nerve block solution: 10 cc solution made of 8 cc of 0.2% ropivacaine, 2 cc of Decadron 10 mg/cc.  1 -1.5 cc injected at each level above bilaterally.  The unused portion of the solution was discarded in the proper designated containers. Procedural Needles: 22-gauge, 3.5-inch, Quincke needles used for all levels.  Once the entire procedure was completed, the treated area was cleaned, making sure to leave some of the prepping solution back to take advantage of its long term bactericidal properties.   Illustration of the posterior view of the lumbar spine and the posterior neural structures. Laminae of L2 through S1 are labeled. DPRL5, dorsal primary ramus of L5; DPRS1, dorsal primary ramus of S1; DPR3, dorsal primary ramus of L3; FJ, facet (zygapophyseal) joint L3-L4; I, inferior articular process of L4; LB1, lateral branch of dorsal primary ramus of L1; IAB, inferior articular branches  from L3 medial branch (supplies L4-L5 facet joint); IBP, intermediate branch plexus; MB3, medial branch of dorsal primary ramus of L3; NR3, third lumbar nerve root; S, superior articular process of L5; SAB, superior articular branches from L4 (supplies L4-5 facet joint also); TP3, transverse process of L3.  Vitals:   02/01/20 1204  02/01/20 1214 02/01/20 1224 02/01/20 1304  BP: 122/72 123/71 122/67 132/66  Pulse:      Resp: 16 15 18 20   Temp:      TempSrc:      SpO2: 95% 98% 100% 100%  Weight:      Height:         Start Time: 1150 hrs. End Time: 1204 hrs.  Imaging Guidance (Spinal):          Type of Imaging Technique: Fluoroscopy Guidance (Spinal) Indication(s): Assistance in needle guidance and placement for procedures requiring needle placement in or near specific anatomical locations not easily accessible without such assistance. Exposure Time: Please see nurses notes. Contrast: None used. Fluoroscopic Guidance: I was personally present during the use of fluoroscopy. "Tunnel Vision Technique" used to obtain the best possible view of the target area. Parallax error corrected before commencing the procedure. "Direction-depth-direction" technique used to introduce the needle under continuous pulsed fluoroscopy. Once target was reached, antero-posterior, oblique, and lateral fluoroscopic projection used confirm needle placement in all planes. Images permanently stored in EMR. Interpretation: No contrast injected. I personally interpreted the imaging intraoperatively. Adequate needle placement confirmed in multiple planes. Permanent images saved into the patient's record.  Antibiotic Prophylaxis:   Anti-infectives (From admission, onward)   None     Indication(s): None identified 5 out of 5 strength bilateral lower extremity: Plantar flexion, dorsiflexion, knee flexion, knee extension.  Post-operative Assessment:  Post-procedure Vital Signs:  Pulse/HCG Rate: 9072 Temp: (!) 97.3 F  (36.3 C) Resp: 20 BP: 132/66 SpO2: 100 %  EBL: None  Complications: No immediate post-treatment complications observed by team, or reported by patient.  Note: The patient tolerated the entire procedure well. A repeat set of vitals were taken after the procedure and the patient was kept under observation following institutional policy, for this type of procedure. Post-procedural neurological assessment was performed, showing return to baseline, prior to discharge. The patient was provided with post-procedure discharge instructions, including a section on how to identify potential problems. Should any problems arise concerning this procedure, the patient was given instructions to immediately contact us, at any time, without hesitation. In any case, we plan to contact the patient by telephone for a follow-up status report regarding this interventional procedure.  Comments:  No additional relevant information.  Plan of Care  Orders:  Orders Placed This Encounter  Procedures  . [facility] C-ARM 1-60 MIN NO REPORT    Intraoperative interpretation by procedural physician at [place].    Standing Status:   Standing    Number of Occurrences:   1    Order Specific Question:   Reason for exam:    Answer:   Assistance in needle guidance and placement for procedures requiring needle placement in or near specific anatomical locations not easily accessible without such assistance.   Medications ordered for procedure: Meds ordered this encounter  Medications  . lidocaine (XYLOCAINE) 2 % (with pres) injection 400 mg  . midazolam (VERSED) 5 MG/5ML injection 1-2 mg    Make sure Flumazenil is available in the pyxis when using this medication. If oversedation occurs, administer 0.2 mg IV over 15 sec. If after 45 sec no response, administer 0.2 mg again over 1 min; may repeat at 1 min intervals; not to exceed 4 doses (1 mg)  . fentaNYL (SUBLIMAZE) injection 25-50 mcg    Make sure Narcan  is available in the pyxis when using this medication. In the event of respiratory depression (RR< 8/min):  Titrate NARCAN (naloxone) in increments of 0.1 to 0.2 mg IV at 2-3 minute intervals, until desired degree of reversal.  . dexamethasone (DECADRON) injection 10 mg  . dexamethasone (DECADRON) injection 10 mg  . ropivacaine (PF) 2 mg/mL (0.2%) (NAROPIN) injection 9 mL  . ropivacaine (PF) 2 mg/mL (0.2%) (NAROPIN) injection 9 mL   Medications administered: We administered midazolam, fentaNYL, dexamethasone, and dexamethasone.  See the medical record for exact dosing, route, and time of administration.  Follow-up plan:   Return in about 2 weeks (around 02/15/2020) for Post Procedure Evaluation, virtual.      Status post bilateral L3, L4, L5 facet medial branch nerve block #2 02/01/2020   Recent Visits Date Type Provider Dept  01/26/20 Office Visit Gillis Santa, MD [facility]  Showing recent visits within past 90 days and meeting all other requirements   Today's Visits Date Type Provider Dept  02/01/20 Procedure visit Gillis Santa, MD [facility]  Showing today's visits and meeting all other requirements   Future Appointments Date Type Provider Dept  02/16/20 Appointment Gillis Santa, MD [facility]  Showing future appointments within next 90 days and meeting all other requirements   Disposition: Discharge home  Discharge (Date  Time): 02/01/2020; 1234 hrs.   Primary Care Physician: Perrin Maltese, MD Location: [facility] Note by: Gillis Santa, MD Date: 02/01/2020; Time: 2:40 PM  Disclaimer:  Medicine is not an exact science. The only guarantee in medicine is that nothing is guaranteed. It is important to note that the decision to proceed with this intervention was based on the information collected from the patient. The Data and conclusions were drawn from the patient's questionnaire, the interview, and the physical  examination. Because the information was provided in large part by the patient, it cannot be guaranteed that it has not been purposely or unconsciously manipulated. Every effort has been made to obtain as much relevant data as possible for this evaluation. It is important to note that the conclusions that lead to this procedure are derived in large part from the available data. Always take into account that the treatment will also be dependent on availability of resources and existing treatment guidelines, considered by other Pain Management Practitioners as being common knowledge and practice, at the time of the intervention. For Medico-Legal purposes, it is also important to point out that variation in procedural techniques and pharmacological choices are the acceptable norm. The indications, contraindications, technique, and results of the above procedure should only be interpreted and judged by a Board-Certified Interventional Pain Specialist with extensive familiarity and expertise in the same exact procedure and technique.

## 2020-02-01 NOTE — Progress Notes (Signed)
Safety precautions to be maintained throughout the outpatient stay will include: orient to surroundings, keep bed in low position, maintain call bell within reach at all times, provide assistance with transfer out of bed and ambulation.  

## 2020-02-01 NOTE — Patient Instructions (Signed)

## 2020-02-02 ENCOUNTER — Telehealth: Payer: Self-pay

## 2020-02-02 NOTE — Telephone Encounter (Signed)
Post procedure phone call.  LM 

## 2020-02-09 ENCOUNTER — Telehealth: Payer: Self-pay | Admitting: *Deleted

## 2020-02-09 NOTE — Telephone Encounter (Signed)
(  02/09/20) Pt has been notified that lung cancer screening CT scan is due currently or will be in near future.] Pt is agreeable for CT scan being scheduled, but was busy and requested we call her back tomorrow instead. SRW

## 2020-02-11 NOTE — Telephone Encounter (Signed)
Reports 2nd covid vaccine scheduled for 02/24/20, will contact at later date to schedule in May.

## 2020-02-15 DIAGNOSIS — E559 Vitamin D deficiency, unspecified: Secondary | ICD-10-CM | POA: Diagnosis not present

## 2020-02-15 DIAGNOSIS — I2510 Atherosclerotic heart disease of native coronary artery without angina pectoris: Secondary | ICD-10-CM | POA: Diagnosis not present

## 2020-02-15 DIAGNOSIS — D72828 Other elevated white blood cell count: Secondary | ICD-10-CM | POA: Diagnosis not present

## 2020-02-15 DIAGNOSIS — J449 Chronic obstructive pulmonary disease, unspecified: Secondary | ICD-10-CM | POA: Diagnosis not present

## 2020-02-15 DIAGNOSIS — E782 Mixed hyperlipidemia: Secondary | ICD-10-CM | POA: Diagnosis not present

## 2020-02-16 ENCOUNTER — Encounter: Payer: Self-pay | Admitting: Student in an Organized Health Care Education/Training Program

## 2020-02-16 ENCOUNTER — Other Ambulatory Visit: Payer: Self-pay

## 2020-02-16 ENCOUNTER — Ambulatory Visit
Payer: Medicare HMO | Attending: Student in an Organized Health Care Education/Training Program | Admitting: Student in an Organized Health Care Education/Training Program

## 2020-02-16 DIAGNOSIS — G894 Chronic pain syndrome: Secondary | ICD-10-CM | POA: Diagnosis not present

## 2020-02-16 DIAGNOSIS — M47816 Spondylosis without myelopathy or radiculopathy, lumbar region: Secondary | ICD-10-CM

## 2020-02-16 NOTE — Progress Notes (Signed)
Patient: Melissa Montes  Service Category: E/M  Provider: Gillis Santa, MD  DOB: 1956/09/12  DOS: 02/16/2020  Location: Office  MRN: [id number]  Setting: Ambulatory outpatient  Referring Provider: Perrin Maltese, MD  Type: Established Patient  Specialty: Interventional Pain Management  PCP: Perrin Maltese, MD  Location: Home  Delivery: TeleHealth     Virtual Encounter - Pain Management PROVIDER NOTE: Information contained herein reflects review and annotations entered in association with encounter. Interpretation of such information and data should be left to medically-trained personnel. Information provided to patient can be located elsewhere in the medical record under "Patient Instructions". Document created using STT-dictation technology, any transcriptional errors that may result from process are unintentional.    Contact & Pharmacy Preferred: [phone] Home: [phone] (home) Mobile: [phone] (mobile) E-mail: [email or web address] [address] [address] [place] [address] Phone: [phone] Fax: [phone]   Pre-screening  Melissa Montes offered "in-person" vs "virtual" encounter. She indicated preferring virtual for this encounter.   Reason COVID-19*  Social distancing based on CDC and AMA recommendations.   I contacted Melissa Montes on 02/16/2020 via telephone.      I clearly identified myself as Gillis Santa, MD. I verified that I was speaking with the correct person using two identifiers (Name: Melissa Montes, and date of birth: 29-Jan-1956).  This visit was completed via telephone due to the restrictions of the COVID-19 pandemic. All issues as above were discussed and addressed but no physical exam was performed. If it was felt that the patient should be evaluated in the office, they were directed there. The patient verbally consented to this visit. Patient was unable  to complete an audio/visual visit due to Technical difficulties and/or Lack of internet. Due to the catastrophic nature of the COVID-19 pandemic, this visit was done through audio contact only.  Location of the patient: home address (see Epic for details)  Location of the provider: office  Consent I sought verbal advanced consent from Melissa Montes for virtual visit interactions. I informed Melissa Montes of possible security and privacy concerns, risks, and limitations associated with providing "not-in-person" medical evaluation and management services. I also informed Ms. Salim of the availability of "in-person" appointments. Finally, I informed her that there would be a charge for the virtual visit and that she could be  personally, fully or partially, financially responsible for it. Melissa Montes expressed understanding and agreed to proceed.   Historic Elements   Melissa Montes is a 64 y.o. year old, female patient evaluated today after her last contact with our practice on 02/02/2020. Melissa Montes  has a past medical history of Anxiety, Bipolar disorder ([place]), Depression, Heart disease, Hyperlipidemia, and MI (myocardial infarction) ([place]). She also  has a past surgical history that includes Coronary angioplasty with stent; Abdominal hysterectomy; CAROTID ANGIOGRAPHY (N/A, 01/13/2018); and Left Heart Cath (Right, 07/07/2018). Melissa Montes has a current medication list which includes the following prescription(s): aspirin ec, atorvastatin, celecoxib, duloxetine, ezetimibe, metoprolol succinate, multivitamin, nitroglycerin, promethazine, sucralfate, symbicort, tramadol, trazodone, nystatin, ondansetron, and tramadol hcl. She  reports that she quit smoking about 16 months ago. Her smoking use included cigarettes. She has a 41.00 pack-year smoking history. She has never used smokeless tobacco. She reports that she does not drink alcohol or use drugs. Melissa Montes is allergic to penicillins and  keflex [cephalexin].   HPI  Today,  she is being contacted for a post-procedure assessment.  Evaluation of last interventional procedure  02/01/2020 Procedure:   Type: Lumbar Facet, Medial Branch Block(s) #2 (#1 done at [facility] clinic with Dr. Sharlet Salina provided 100% pain relief for approximately 7-8 hours) Primary Purpose: Diagnostic Region: Posterolateral Lumbosacral Spine Level:L3, L4, L5, Medial Branch Level(s). Injecting these levels blocks the L3-4, L4-5 lumbar facet joints. Laterality: Bilateral  Sedation: Please see nurses note for DOS. When no sedatives are used, the analgesic levels obtained are directly associated to the effectiveness of the local anesthetics. However, when sedation is provided, the level of analgesia obtained during the initial 1 hour following the intervention, is believed to be the result of a combination of factors. These factors may include, but are not limited to: 1. The effectiveness of the local anesthetics used. 2. The effects of the analgesic(s) and/or anxiolytic(s) used. 3. The degree of discomfort experienced by the patient at the time of the procedure. 4. The patients ability and reliability in recalling and recording the events. 5. The presence and influence of possible secondary gains and/or psychosocial factors. Reported result: Relief experienced during the 1st hour after the procedure: 90 % (Ultra-Short Term Relief)            Interpretative annotation: Clinically appropriate result. Analgesia during this period is likely to be Local Anesthetic and/or IV Sedative (Analgesic/Anxiolytic) related.          Effects of local anesthetic: The analgesic effects attained during this period are directly associated to the localized infiltration of local anesthetics and therefore cary significant diagnostic value as to the etiological location, or anatomical origin, of the pain. Expected duration of relief is directly dependent on the pharmacodynamics of the  local anesthetic used. Long-acting (4-6 hours) anesthetics used.  Reported result: Relief during the next 4 to 6 hour after the procedure: 90 % (Short-Term Relief)            Interpretative annotation: Clinically appropriate result. Analgesia during this period is likely to be Local Anesthetic-related.           90% pain RELIEF about 12 hours, patient would like to proceed with RFA- start with Left side first L3, L4, 5 and then right   Laboratory Chemistry Profile   Renal Lab Results  Component Value Date   BUN 22 03/31/2019   CREATININE 0.67 03/31/2019   GFRAA >60 03/31/2019   GFRNONAA >60 03/31/2019     Hepatic Lab Results  Component Value Date   AST 25 03/31/2019   ALT 21 03/31/2019   ALBUMIN 4.2 03/31/2019   ALKPHOS 101 03/31/2019     Electrolytes Lab Results  Component Value Date   NA 140 03/31/2019   K 4.8 03/31/2019   CL 104 03/31/2019   CALCIUM 9.5 03/31/2019     Bone No results found for: VD25OH, VD125OH2TOT, EB5830NM0, HW8088PJ0, 25OHVITD1, 25OHVITD2, 25OHVITD3, TESTOFREE, TESTOSTERONE   Inflammation (CRP: Acute Phase) (ESR: Chronic Phase) No results found for: CRP, ESRSEDRATE, LATICACIDVEN     Note: Above Lab results reviewed.   Assessment  The primary encounter diagnosis was Lumbar facet arthropathy. Diagnoses of Lumbar spondylosis and Chronic pain syndrome were also pertinent to this visit.  Plan of Care   Melissa Montes has a current medication list which includes the following long-term medication(s): atorvastatin, ezetimibe, metoprolol succinate, nitroglycerin, promethazine, sucralfate, and symbicort.  Orders:  Orders Placed This Encounter  Procedures  . Radiofrequency,Lumbar    Standing Status:   Future    Standing  Expiration Date:   08/18/2021    Scheduling Instructions:     Side(s): Left-sided     Level: L3-4, L4-5, Facets ( L3, L4, L5, Medial Branch Nerves)     Sedation: With Sedation     Scheduling Timeframe: As soon as  pre-approved    Order Specific Question:   Where will this procedure be performed?    Answer:   [facility]  . Radiofrequency,Lumbar    Standing Status:   Future    Standing Expiration Date:   08/18/2021    Scheduling Instructions:     Side(s): Right-sided     Level: L3-4, L4-5, Facets (L3, L4, L5,  Medial Branch Nerves)     Sedation: With Sedation     Scheduling Timeframe: 2 weeks after LEFT    Order Specific Question:   Where will this procedure be performed?    Answer:   [facility]   Follow-up plan:   Return in about 2 weeks (around 03/01/2020) for Left L3, 4, 5 RFA , with sedation.     Status post bilateral L3, L4, L5 facet medial branch nerve block #2 3/15/202, 90% pain relief for 10-12 hrs, proceed with RFA     Recent Visits Date Type Provider Dept  02/01/20 Procedure visit Gillis Santa, MD [facility]  01/26/20 Office Visit Gillis Santa, MD [facility]  Showing recent visits within past 90 days and meeting all other requirements   Today's Visits Date Type Provider Dept  02/16/20 Office Visit Gillis Santa, MD [facility]  Showing today's visits and meeting all other requirements   Future Appointments No visits were found meeting these conditions.  Showing future appointments within next 90 days and meeting all other requirements   I discussed the assessment and treatment plan with the patient. The patient was provided an opportunity to ask questions and all were answered. The patient agreed with the plan and demonstrated an understanding of the instructions.  Patient advised to call back or seek an in-person evaluation if the symptoms or condition worsens.  Duration of encounter: 25 minutes.  Note by: Gillis Santa, MD Date: 02/16/2020; Time: 3:04 PM

## 2020-02-23 ENCOUNTER — Ambulatory Visit (INDEPENDENT_AMBULATORY_CARE_PROVIDER_SITE_OTHER): Payer: Medicare HMO | Admitting: Vascular Surgery

## 2020-02-23 ENCOUNTER — Encounter (INDEPENDENT_AMBULATORY_CARE_PROVIDER_SITE_OTHER): Payer: Medicare HMO

## 2020-02-29 DIAGNOSIS — I10 Essential (primary) hypertension: Secondary | ICD-10-CM | POA: Diagnosis not present

## 2020-02-29 DIAGNOSIS — I2510 Atherosclerotic heart disease of native coronary artery without angina pectoris: Secondary | ICD-10-CM | POA: Diagnosis not present

## 2020-02-29 DIAGNOSIS — R109 Unspecified abdominal pain: Secondary | ICD-10-CM | POA: Diagnosis not present

## 2020-02-29 DIAGNOSIS — R69 Illness, unspecified: Secondary | ICD-10-CM | POA: Diagnosis not present

## 2020-03-01 ENCOUNTER — Telehealth: Payer: Self-pay | Admitting: Student in an Organized Health Care Education/Training Program

## 2020-03-01 NOTE — Telephone Encounter (Signed)
Will make a noate of this in her chart.

## 2020-03-01 NOTE — Telephone Encounter (Signed)
Patient called about upcoming RFA appt. She states she really had a lot of pain when she came in for the facet. She is very concerned that she will be hurting with the RFA like she did before. She says Dr. Holley Raring did say he could increase sedation on her procedure. Could you make a note on the upcoming appt to remind him of this.

## 2020-03-07 ENCOUNTER — Telehealth: Payer: Self-pay

## 2020-03-07 NOTE — Telephone Encounter (Signed)
Message left notifying patient that it is time to schedule the low dose lung cancer screening CT scan.  Instructed patient to return call to Shawn Perkins at [phone] to verify information prior to CT scan being scheduled.    

## 2020-03-14 ENCOUNTER — Encounter: Payer: Self-pay | Admitting: Student in an Organized Health Care Education/Training Program

## 2020-03-14 ENCOUNTER — Ambulatory Visit
Admission: RE | Admit: 2020-03-14 | Discharge: 2020-03-14 | Disposition: A | Discharge status: Home / Self Care | Payer: Medicare HMO | Source: Ambulatory Visit | Attending: Student in an Organized Health Care Education/Training Program | Admitting: Student in an Organized Health Care Education/Training Program

## 2020-03-14 ENCOUNTER — Ambulatory Visit (HOSPITAL_BASED_OUTPATIENT_CLINIC_OR_DEPARTMENT_OTHER): Payer: Medicare HMO | Admitting: Student in an Organized Health Care Education/Training Program

## 2020-03-14 ENCOUNTER — Other Ambulatory Visit: Payer: Self-pay

## 2020-03-14 ENCOUNTER — Ambulatory Visit: Payer: Medicare HMO | Admitting: Student in an Organized Health Care Education/Training Program

## 2020-03-14 VITALS — BP 107/66 | HR 85 | Temp 98.1°F | Resp 18 | Ht 62.0 in | Wt 114.0 lb

## 2020-03-14 DIAGNOSIS — G894 Chronic pain syndrome: Secondary | ICD-10-CM | POA: Insufficient documentation

## 2020-03-14 DIAGNOSIS — M47816 Spondylosis without myelopathy or radiculopathy, lumbar region: Secondary | ICD-10-CM | POA: Diagnosis not present

## 2020-03-14 MED ORDER — FENTANYL CITRATE (PF) 100 MCG/2ML IJ SOLN
INTRAMUSCULAR | Status: AC
  Filled 2020-03-14: qty 2

## 2020-03-14 MED ORDER — ROPIVACAINE HCL 2 MG/ML IJ SOLN
INTRAMUSCULAR | Status: AC
  Filled 2020-03-14: qty 10

## 2020-03-14 MED ORDER — DEXAMETHASONE SODIUM PHOSPHATE 10 MG/ML IJ SOLN
20.0000 mg | Freq: Once | INTRAMUSCULAR | Status: AC
  Administered 2020-03-14: 10 mg

## 2020-03-14 MED ORDER — DEXAMETHASONE SODIUM PHOSPHATE 10 MG/ML IJ SOLN
INTRAMUSCULAR | Status: AC
  Filled 2020-03-14: qty 1

## 2020-03-14 MED ORDER — MIDAZOLAM HCL 5 MG/5ML IJ SOLN
INTRAMUSCULAR | Status: AC
  Filled 2020-03-14: qty 5

## 2020-03-14 MED ORDER — MIDAZOLAM HCL 5 MG/5ML IJ SOLN
1.0000 mg | INTRAMUSCULAR | Status: DC | PRN
  Administered 2020-03-14: 2 mg via INTRAVENOUS

## 2020-03-14 MED ORDER — LIDOCAINE HCL 2 % IJ SOLN
INTRAMUSCULAR | Status: AC
  Filled 2020-03-14: qty 20

## 2020-03-14 MED ORDER — ROPIVACAINE HCL 2 MG/ML IJ SOLN
9.0000 mL | Freq: Once | INTRAMUSCULAR | Status: AC
  Administered 2020-03-14: 10 mL via PERINEURAL

## 2020-03-14 MED ORDER — FENTANYL CITRATE (PF) 100 MCG/2ML IJ SOLN
25.0000 ug | INTRAMUSCULAR | Status: DC | PRN
  Administered 2020-03-14: 100 ug via INTRAVENOUS

## 2020-03-14 MED ORDER — TRAMADOL HCL 50 MG PO TABS: 50.0000 mg | ORAL_TABLET | Freq: Three times a day (TID) | ORAL | 0 refills | Status: DC | PRN

## 2020-03-14 NOTE — Patient Instructions (Signed)
Pain Management Discharge Instructions  General Discharge Instructions :  If you need to reach your doctor call: Monday-Friday 8:00 am - 4:00 pm at [phone] or toll free [phone].  After clinic hours [phone] to have operator reach doctor.  Bring all of your medication bottles to all your appointments in the pain clinic.  To cancel or reschedule your appointment with Pain Management please remember to call 24 hours in advance to avoid a fee.  Refer to the educational materials which you have been given on: General Risks, I had my Procedure. Discharge Instructions, Post Sedation.  Post Procedure Instructions:  The drugs you were given will stay in your system until tomorrow, so for the next 24 hours you should not drive, make any legal decisions or drink any alcoholic beverages.  You may eat anything you prefer, but it is better to start with liquids then soups and crackers, and gradually work up to solid foods.  Please notify your doctor immediately if you have any unusual bleeding, trouble breathing or pain that is not related to your normal pain.  Depending on the type of procedure that was done, some parts of your body may feel week and/or numb.  This usually clears up by tonight or the next day.  Walk with the use of an assistive device or accompanied by an adult for the 24 hours.  You may use ice on the affected area for the first 24 hours.  Put ice in a Ziploc bag and cover with a towel and place against area 15 minutes on 15 minutes off.  You may switch to heat after 24 hours.Radiofrequency Lesioning Radiofrequency lesioning is a procedure that is performed to relieve pain. The procedure is often used for back, neck, or arm pain. Radiofrequency lesioning involves the use of a machine that creates radio waves to make heat. During the procedure, the heat is applied to the nerve that carries the pain signal. The heat damages the nerve and interferes with the pain signal.  Pain relief usually starts about 2 weeks after the procedure and lasts for 6 months to 1 year. You will be awake during the procedure. You will need to be able to talk with the health care provider during the procedure. Tell a health care provider about:  Any allergies you have.  All medicines you are taking, including vitamins, herbs, eye drops, creams, and over-the-counter medicines.  Any problems you or family members have had with anesthetic medicines.  Any blood disorders you have.  Any surgeries you have had.  Any medical conditions you have or have had.  Whether you are pregnant or may be pregnant. What are the risks? Generally, this is a safe procedure. However, problems may occur, including:  Pain or soreness at the injection site.  Allergic reaction to medicines given during the procedure.  Bleeding.  Infection at the injection site.  Damage to nerves or blood vessels. What happens before the procedure? Staying hydrated Follow instructions from your health care provider about hydration, which may include:  Up to 2 hours before the procedure - you may continue to drink clear liquids, such as water, clear fruit juice, black coffee, and plain tea. Eating and drinking Follow instructions from your health care provider about eating and drinking, which may include:  8 hours before the procedure - stop eating heavy meals or foods, such as meat, fried foods, or fatty foods.  6 hours before the procedure - stop eating light meals or foods, such as toast  or cereal.  6 hours before the procedure - stop drinking milk or drinks that contain milk.  2 hours before the procedure - stop drinking clear liquids. Medicines Ask your health care provider about:  Changing or stopping your regular medicines. This is especially important if you are taking diabetes medicines or blood thinners.  Taking medicines such as aspirin and ibuprofen. These medicines can thin your blood. Do  not take these medicines unless your health care provider tells you to take them.  Taking over-the-counter medicines, vitamins, herbs, and supplements. General instructions  Plan to have someone take you home from the hospital or clinic.  If you will be going home right after the procedure, plan to have someone with you for 24 hours.  Ask your health care provider what steps will be taken to help prevent infection. These may include: ? Removing hair at the procedure site. ? Washing skin with a germ-killing soap. ? Taking antibiotic medicine. What happens during the procedure?   An IV will be inserted into one of your veins.  You will be given one or more of the following: ? A medicine to help you relax (sedative). ? A medicine to numb the area (local anesthetic).  Your health care provider will insert a radiofrequency needle into the area to be treated. This is done with the help of a type of X-ray (fluoroscopy).  A wire that carries the radio waves (electrode) will be put through the radiofrequency needle.  An electrical pulse will be sent through the electrode to verify the correct nerve that is causing your pain. You will feel a tingling sensation, and you may have muscle twitching.  The tissue around the needle tip will be heated by an electric current that comes from the radiofrequency machine. This will numb the nerves.  The needle will be removed.  A bandage (dressing) will be put on the insertion area. The procedure may vary among health care providers and hospitals. What happens after the procedure?  Your blood pressure, heart rate, breathing rate, and blood oxygen level will be monitored until you leave the hospital or clinic.  Return to your normal activities as told by your health care provider. Ask your health care provider what activities are safe for you.  Do not drive for 24 hours if you were given a sedative during your procedure. Summary  Radiofrequency  lesioning is a procedure that is performed to relieve pain. The procedure is often used for back, neck, or arm pain.  Radiofrequency lesioning involves the use of a machine that creates radio waves to make heat.  Plan to have someone take you home from the hospital or clinic. Do not drive for 24 hours if you were given a sedative during your procedure.  Return to your normal activities as told by your health care provider. Ask your health care provider what activities are safe for you. This information is not intended to replace advice given to you by your health care provider. Make sure you discuss any questions you have with your health care provider. Document Revised: 07/24/2018 Document Reviewed: 07/24/2018 [employer] Patient Education  2020 [employer]  

## 2020-03-14 NOTE — Progress Notes (Signed)
PROVIDER NOTE: Information contained herein reflects review and annotations entered in association with encounter. Interpretation of such information and data should be left to medically-trained personnel. Information provided to patient can be located elsewhere in the medical record under "Patient Instructions". Document created using STT-dictation technology, any transcriptional errors that may result from process are unintentional.    Patient: Melissa Montes  Service Category: Procedure  Provider: Gillis Santa, MD  DOB: 1956-11-17  DOS: 03/14/2020  Location: [facility]  MRN: [id number]  Setting: Ambulatory - outpatient  Referring Provider: Gillis Santa, MD  Type: Established Patient  Specialty: Interventional Pain Management  PCP: Perrin Maltese, MD   Primary Reason for Visit: Interventional Pain Management Treatment. CC: Back Pain (lower back radiates into buttox)  Procedure:          Anesthesia, Analgesia, Anxiolysis:  Type: Thermal Lumbar Facet, Medial Branch Radiofrequency Ablation/Neurotomy  #1  Primary Purpose: Therapeutic Region: Posterolateral Lumbosacral Spine Level: L3, L4, L5, Medial Branch Level(s). These levels will denervate the L3-4, L4-5, lumbar facet joints. Laterality: Left  Type: Moderate (Conscious) Sedation combined with Local Anesthesia Indication(s): Analgesia and Anxiety Route: Intravenous (IV) IV Access: Secured Sedation: Meaningful verbal contact was maintained at all times during the procedure  Local Anesthetic: Lidocaine 1-2%  Position: Prone   Indications: 1. Lumbar facet arthropathy   2. Lumbar spondylosis   3. Chronic pain syndrome    Ms. Julson has been dealing with the above chronic pain for longer than three months and has either failed to respond, was unable to tolerate, or simply did not get enough benefit from other more conservative therapies including, but not limited to: 1. Over-the-counter medications 2.  Anti-inflammatory medications 3. Muscle relaxants 4. Membrane stabilizers 5. Opioids 6. Physical therapy and/or chiropractic manipulation 7. Modalities (Heat, ice, etc.) 8. Invasive techniques such as nerve blocks. Ms. Steinhardt has attained more than 50% relief of the pain from a series of diagnostic injections conducted in separate occasions.  Pain Score: Pre-procedure: 4 /10 Post-procedure: 1 /10  Pre-op Assessment:  Ms. Devincent is a 64 y.o. (year old), female patient, seen today for interventional treatment. She  has a past surgical history that includes Coronary angioplasty with stent; Abdominal hysterectomy; CAROTID ANGIOGRAPHY (N/A, 01/13/2018); and Left Heart Cath (Right, 07/07/2018). Ms. Fierro has a current medication list which includes the following prescription(s): aspirin ec, atorvastatin, duloxetine, ezetimibe, metoprolol succinate, multivitamin, nitroglycerin, promethazine, sucralfate, symbicort, trazodone, celecoxib, nystatin, ondansetron, tramadol, tramadol, and tramadol hcl, and the following Facility-Administered Medications: fentanyl and midazolam. Her primarily concern today is the Back Pain (lower back radiates into buttox)  Initial Vital Signs:  Pulse/HCG Rate: 85ECG Heart Rate: 76 Temp: 97.9 F (36.6 C) Resp: 18 BP: 110/74 SpO2: 99 %  BMI: Estimated body mass index is 20.85 kg/m as calculated from the following:   Height as of this encounter: 5\' 2"  (1.575 m).   Weight as of this encounter: 114 lb (51.7 kg).  Risk Assessment: Allergies: Reviewed. She is allergic to penicillins; amoxicillin-pot clavulanate; and keflex [cephalexin].  Allergy Precautions: None required Coagulopathies: Reviewed. None identified.  Blood-thinner therapy: None at this time Active Infection(s): Reviewed. None identified. Ms. Garbers is afebrile  Site Confirmation: Ms. Hagemeister was asked to confirm the procedure and laterality before marking the site Procedure checklist:  Completed Consent: Before the procedure and under the influence of no sedative(s), amnesic(s), or anxiolytics, the patient was informed of the treatment options, risks and possible complications. To fulfill our ethical and legal obligations, as  recommended by the American Medical Association's Code of Ethics, I have informed the patient of my clinical impression; the nature and purpose of the treatment or procedure; the risks, benefits, and possible complications of the intervention; the alternatives, including doing nothing; the risk(s) and benefit(s) of the alternative treatment(s) or procedure(s); and the risk(s) and benefit(s) of doing nothing. The patient was provided information about the general risks and possible complications associated with the procedure. These may include, but are not limited to: failure to achieve desired goals, infection, bleeding, organ or nerve damage, allergic reactions, paralysis, and death. In addition, the patient was informed of those risks and complications associated to Spine-related procedures, such as failure to decrease pain; infection (i.e.: Meningitis, epidural or intraspinal abscess); bleeding (i.e.: epidural hematoma, subarachnoid hemorrhage, or any other type of intraspinal or peri-dural bleeding); organ or nerve damage (i.e.: Any type of peripheral nerve, nerve root, or spinal cord injury) with subsequent damage to sensory, motor, and/or autonomic systems, resulting in permanent pain, numbness, and/or weakness of one or several areas of the body; allergic reactions; (i.e.: anaphylactic reaction); and/or death. Furthermore, the patient was informed of those risks and complications associated with the medications. These include, but are not limited to: allergic reactions (i.e.: anaphylactic or anaphylactoid reaction(s)); adrenal axis suppression; blood sugar elevation that in diabetics may result in ketoacidosis or comma; water retention that in patients with history  of congestive heart failure may result in shortness of breath, pulmonary edema, and decompensation with resultant heart failure; weight gain; swelling or edema; medication-induced neural toxicity; particulate matter embolism and blood vessel occlusion with resultant organ, and/or nervous system infarction; and/or aseptic necrosis of one or more joints. Finally, the patient was informed that Medicine is not an exact science; therefore, there is also the possibility of unforeseen or unpredictable risks and/or possible complications that may result in a catastrophic outcome. The patient indicated having understood very clearly. We have given the patient no guarantees and we have made no promises. Enough time was given to the patient to ask questions, all of which were answered to the patient's satisfaction. Ms. Audelo has indicated that she wanted to continue with the procedure. Attestation: I, the ordering provider, attest that I have discussed with the patient the benefits, risks, side-effects, alternatives, likelihood of achieving goals, and potential problems during recovery for the procedure that I have provided informed consent. Date  Time: 03/14/2020  7:37 AM  Pre-Procedure Preparation:  Monitoring: As per clinic protocol. Respiration, ETCO2, SpO2, BP, heart rate and rhythm monitor placed and checked for adequate function Safety Precautions: Patient was assessed for positional comfort and pressure points before starting the procedure. Time-out: I initiated and conducted the "Time-out" before starting the procedure, as per protocol. The patient was asked to participate by confirming the accuracy of the "Time Out" information. Verification of the correct person, site, and procedure were performed and confirmed by me, the nursing staff, and the patient. "Time-out" conducted as per Joint Commission's Universal Protocol (UP.01.01.01). Time: 0828  Description of Procedure:          Laterality:  Left Levels:  L3, L4, L5, Medial Branch Level(s), at the L3-4, L4-5 lumbar facet joints. Area Prepped: Lumbosacral DuraPrep (Iodine Povacrylex [0.7% available iodine] and Isopropyl Alcohol, 74% w/w) Safety Precautions: Aspiration looking for blood return was conducted prior to all injections. At no point did we inject any substances, as a needle was being advanced. Before injecting, the patient was told to immediately notify me if she was experiencing  any new onset of "ringing in the ears, or metallic taste in the mouth". No attempts were made at seeking any paresthesias. Safe injection practices and needle disposal techniques used. Medications properly checked for expiration dates. SDV (single dose vial) medications used. After the completion of the procedure, all disposable equipment used was discarded in the proper designated medical waste containers. Local Anesthesia: Protocol guidelines were followed. The patient was positioned over the fluoroscopy table. The area was prepped in the usual manner. The time-out was completed. The target area was identified using fluoroscopy. A 12-in long, straight, sterile hemostat was used with fluoroscopic guidance to locate the targets for each level blocked. Once located, the skin was marked with an approved surgical skin marker. Once all sites were marked, the skin (epidermis, dermis, and hypodermis), as well as deeper tissues (fat, connective tissue and muscle) were infiltrated with a small amount of a short-acting local anesthetic, loaded on a 10cc syringe with a 25G, 1.5-in  Needle. An appropriate amount of time was allowed for local anesthetics to take effect before proceeding to the next step. Local Anesthetic: Lidocaine 2.0% The unused portion of the local anesthetic was discarded in the proper designated containers. Technical explanation of process:  Radiofrequency Ablation (RFA)  L3 Medial Branch Nerve RFA: The target area for the L3 medial branch is at the  junction of the postero-lateral aspect of the superior articular process and the superior, posterior, and medial edge of the transverse process of L4. Under fluoroscopic guidance, a Radiofrequency needle was inserted until contact was made with os over the superior postero-lateral aspect of the pedicular shadow (target area). Sensory and motor testing was conducted to properly adjust the position of the needle. Once satisfactory placement of the needle was achieved, the numbing solution was slowly injected after negative aspiration for blood. 2.0 mL of the nerve block solution was injected without difficulty or complication. After waiting for at least 3 minutes, the ablation was performed. Once completed, the needle was removed intact. L4 Medial Branch Nerve RFA: The target area for the L4 medial branch is at the junction of the postero-lateral aspect of the superior articular process and the superior, posterior, and medial edge of the transverse process of L5. Under fluoroscopic guidance, a Radiofrequency needle was inserted until contact was made with os over the superior postero-lateral aspect of the pedicular shadow (target area). Sensory and motor testing was conducted to properly adjust the position of the needle. Once satisfactory placement of the needle was achieved, the numbing solution was slowly injected after negative aspiration for blood. 2.0 mL of the nerve block solution was injected without difficulty or complication. After waiting for at least 3 minutes, the ablation was performed. Once completed, the needle was removed intact. L5 Medial Branch Nerve RFA: The target area for the L5 medial branch is at the junction of the postero-lateral aspect of the superior articular process of S1 and the superior, posterior, and medial edge of the sacral ala. Under fluoroscopic guidance, a Radiofrequency needle was inserted until contact was made with os over the superior postero-lateral aspect of the pedicular  shadow (target area). Sensory and motor testing was conducted to properly adjust the position of the needle. Once satisfactory placement of the needle was achieved, the numbing solution was slowly injected after negative aspiration for blood. 2.0 mL of the nerve block solution was injected without difficulty or complication. After waiting for at least 3 minutes, the ablation was performed. Once completed, the needle was removed intact.  Radiofrequency lesioning (ablation):  Radiofrequency Generator: NeuroTherm [id number] Sensory Stimulation Parameters: 50 Hz was used to locate & identify the nerve, making sure that the needle was positioned such that there was no sensory stimulation below 0.3 V or above 0.7 V. Motor Stimulation Parameters: 2 Hz was used to evaluate the motor component. Care was taken not to lesion any nerves that demonstrated motor stimulation of the lower extremities at an output of less than 2.5 times that of the sensory threshold, or a maximum of 2.0 V. Lesioning Technique Parameters: Standard Radiofrequency settings. (Not bipolar or pulsed.) Temperature Settings: 80 degrees C Lesioning time: 60 seconds Intra-operative Compliance: Compliant Materials & Medications: Needle(s) (Electrode/Cannula) Type: Teflon-coated, curved tip, Radiofrequency needle(s) Gauge: 22G Length: 10cm Numbing solution: 6 cc solution made of 5 cc of 0.2% ropivacaine, 1 cc of Decadron 10 mg/cc.  2 cc injected at each level above after sensorimotor testing prior to lesioning.   Once the entire procedure was completed, the treated area was cleaned, making sure to leave some of the prepping solution back to take advantage of its long term bactericidal properties.  Illustration of the posterior view of the lumbar spine and the posterior neural structures. Laminae of L2 through S1 are labeled. DPRL5, dorsal primary ramus of L5; DPRS1, dorsal primary ramus of S1; DPR3, dorsal primary ramus of L3; FJ, facet  (zygapophyseal) joint L3-L4; I, inferior articular process of L4; LB1, lateral branch of dorsal primary ramus of L1; IAB, inferior articular branches from L3 medial branch (supplies L4-L5 facet joint); IBP, intermediate branch plexus; MB3, medial branch of dorsal primary ramus of L3; NR3, third lumbar nerve root; S, superior articular process of L5; SAB, superior articular branches from L4 (supplies L4-5 facet joint also); TP3, transverse process of L3.  Vitals:   03/14/20 0855 03/14/20 0905 03/14/20 0915 03/14/20 0925  BP: 104/74 106/78 108/61 107/66  Pulse:      Resp: 14 20 16 18   Temp: 98 F (36.7 C)   98.1 F (36.7 C)  TempSrc: Temporal   Temporal  SpO2: 97% 98% 97% 97%  Weight:      Height:       Start Time: 0828 hrs. End Time: 0845 hrs.  Imaging Guidance (Spinal):          Type of Imaging Technique: Fluoroscopy Guidance (Spinal) Indication(s): Assistance in needle guidance and placement for procedures requiring needle placement in or near specific anatomical locations not easily accessible without such assistance. Exposure Time: Please see nurses notes. Contrast: None used. Fluoroscopic Guidance: I was personally present during the use of fluoroscopy. "Tunnel Vision Technique" used to obtain the best possible view of the target area. Parallax error corrected before commencing the procedure. "Direction-depth-direction" technique used to introduce the needle under continuous pulsed fluoroscopy. Once target was reached, antero-posterior, oblique, and lateral fluoroscopic projection used confirm needle placement in all planes. Images permanently stored in EMR. Interpretation: No contrast injected. I personally interpreted the imaging intraoperatively. Adequate needle placement confirmed in multiple planes. Permanent images saved into the patient's record.  Antibiotic Prophylaxis:   Anti-infectives (From admission, onward)   None     Indication(s): None identified  Post-operative  Assessment:  Post-procedure Vital Signs:  Pulse/HCG Rate: 8564 Temp: 98.1 F (36.7 C) Resp: 18 BP: 107/66 SpO2: 97 %  EBL: None  Complications: No immediate post-treatment complications observed by team, or reported by patient.  Note: The patient tolerated the entire procedure well. A repeat set of vitals were taken after the procedure and  the patient was kept under observation following institutional policy, for this type of procedure. Post-procedural neurological assessment was performed, showing return to baseline, prior to discharge. The patient was provided with post-procedure discharge instructions, including a section on how to identify potential problems. Should any problems arise concerning this procedure, the patient was given instructions to immediately contact us, at any time, without hesitation. In any case, we plan to contact the patient by telephone for a follow-up status report regarding this interventional procedure.  Comments:  No additional relevant information.  Plan of Care  Orders:  Orders Placed This Encounter  Procedures  . [facility] C-ARM 1-60 MIN NO REPORT    Intraoperative interpretation by procedural physician at [facility].    Standing Status:   Standing    Number of Occurrences:   1    Order Specific Question:   Reason for exam:    Answer:   Assistance in needle guidance and placement for procedures requiring needle placement in or near specific anatomical locations not easily accessible without such assistance.   Medications ordered for procedure: Meds ordered this encounter  Medications  . midazolam (VERSED) 5 MG/5ML injection 1-2 mg    Make sure Flumazenil is available in the pyxis when using this medication. If oversedation occurs, administer 0.2 mg IV over 15 sec. If after 45 sec no response, administer 0.2 mg again over 1 min; may repeat at 1 min intervals; not to exceed 4 doses (1 mg)  . fentaNYL (SUBLIMAZE) injection 25-50 mcg     Make sure Narcan is available in the pyxis when using this medication. In the event of respiratory depression (RR< 8/min): Titrate NARCAN (naloxone) in increments of 0.1 to 0.2 mg IV at 2-3 minute intervals, until desired degree of reversal.  . ropivacaine (PF) 2 mg/mL (0.2%) (NAROPIN) injection 9 mL  . dexamethasone (DECADRON) injection 20 mg  . traMADol (ULTRAM) 50 MG tablet    Sig: Take 1 tablet (50 mg total) by mouth every 8 (eight) hours as needed for severe pain. Month last 30 days.    Dispense:  90 tablet    Refill:  0    [place] STOP ACT - Not applicable. Fill one day early if pharmacy is closed on scheduled refill date.   Medications administered: We administered midazolam, fentaNYL, ropivacaine (PF) 2 mg/mL (0.2%), and dexamethasone.  See the medical record for exact dosing, route, and time of administration.  Follow-up plan:   Return in about 2 weeks (around 03/28/2020) for RIGHT L3, 4, 5 RFA, with sedation.      Status post bilateral L3, L4, L5 facet medial branch nerve block #2 3/15/202, 90% pain relief for 10-12 hrs, proceed with RFA.  Left L3, L4, L5 RFA 03/14/2020.      Recent Visits Date Type Provider Dept  02/16/20 Office Visit Gillis Santa, MD [facility]  02/01/20 Procedure visit Gillis Santa, MD [facility]  01/26/20 Office Visit Gillis Santa, MD [facility]  Showing recent visits within past 90 days and meeting all other requirements   Today's Visits Date Type Provider Dept  03/14/20 Procedure visit Gillis Santa, MD [facility]  Showing today's visits and meeting all other requirements   Future Appointments Date Type Provider Dept  04/11/20 Appointment Gillis Santa, MD [facility]  Showing future appointments within next 90 days and meeting all other requirements   Disposition: Discharge home  Discharge (Date  Time): 03/14/2020; 0930 hrs.   Primary Care  Physician: Perrin Maltese, MD Location: [facility] Note by: Gillis Santa, MD Date: 03/14/2020; Time: 10:34 AM  Disclaimer:  Medicine is not an exact science. The only guarantee in medicine is that nothing is guaranteed. It is important to note that the decision to proceed with this intervention was based on the information collected from the patient. The Data and conclusions were drawn from the patient's questionnaire, the interview, and the physical examination. Because the information was provided in large part by the patient, it cannot be guaranteed that it has not been purposely or unconsciously manipulated. Every effort has been made to obtain as much relevant data as possible for this evaluation. It is important to note that the conclusions that lead to this procedure are derived in large part from the available data. Always take into account that the treatment will also be dependent on availability of resources and existing treatment guidelines, considered by other Pain Management Practitioners as being common knowledge and practice, at the time of the intervention. For Medico-Legal purposes, it is also important to point out that variation in procedural techniques and pharmacological choices are the acceptable norm. The indications, contraindications, technique, and results of the above procedure should only be interpreted and judged by a Board-Certified Interventional Pain Specialist with extensive familiarity and expertise in the same exact procedure and technique.

## 2020-03-15 ENCOUNTER — Telehealth: Payer: Self-pay

## 2020-03-15 NOTE — Telephone Encounter (Signed)
LM

## 2020-03-18 ENCOUNTER — Telehealth: Payer: Self-pay

## 2020-03-18 NOTE — Telephone Encounter (Signed)
Message left notifying patient that it is time to schedule the low dose lung cancer screening CT scan.  Instructed patient to return call to Shawn Perkins at [phone] to verify information prior to CT scan being scheduled.    

## 2020-03-28 ENCOUNTER — Telehealth: Payer: Self-pay | Admitting: Student in an Organized Health Care Education/Training Program

## 2020-03-28 NOTE — Telephone Encounter (Signed)
Patient advised to remain on the dose prescribed. Please advise if she can increase to 2 tabs.

## 2020-03-28 NOTE — Telephone Encounter (Signed)
Patient states the Tramadol 50mg  is not really helping cut pain enough. She did try taking 2 50mg  tabs and that seemed to help more. Can she take 2 tabs? Her script only says take 1.

## 2020-03-29 ENCOUNTER — Other Ambulatory Visit: Payer: Self-pay | Admitting: Student in an Organized Health Care Education/Training Program

## 2020-03-29 NOTE — Progress Notes (Unsigned)
Patient can increase to 2 tablets, 100 mg at a time.

## 2020-03-30 ENCOUNTER — Telehealth: Payer: Self-pay

## 2020-03-30 DIAGNOSIS — Z122 Encounter for screening for malignant neoplasm of respiratory organs: Secondary | ICD-10-CM

## 2020-03-30 DIAGNOSIS — Z87891 Personal history of nicotine dependence: Secondary | ICD-10-CM

## 2020-03-30 NOTE — Telephone Encounter (Signed)
Patient has been notified that the low dose lung cancer screening CT scan is due currently or will be in near future.  Confirmed that patient is within the appropriate age range and asymptomatic, (no signs or symptoms of lung cancer).  Patient denies illness that would prevent curative treatment for lung cancer if found.  Patient is agreeable for CT scan being scheduled.   Verified smoking history (former smoker, quit 09/2018 with 41 year 1 ppd  history).   Received 2nd COVID vaccine on 02/24/20.  CT scan scheduled for 04/07/20 @ 1:15.

## 2020-03-30 NOTE — Progress Notes (Signed)
Patient advised per voicemail

## 2020-03-31 NOTE — Telephone Encounter (Signed)
Smoking history: former, quit 09/19/18, 41 pack year

## 2020-03-31 NOTE — Addendum Note (Signed)
Addended by: Lieutenant Diego on: 03/31/2020 02:55 PM   Modules accepted: Orders

## 2020-04-05 ENCOUNTER — Other Ambulatory Visit: Payer: Medicare HMO

## 2020-04-07 ENCOUNTER — Ambulatory Visit
Admission: RE | Admit: 2020-04-07 | Discharge: 2020-04-07 | Disposition: A | Discharge status: Home / Self Care | Payer: Medicare HMO | Source: Ambulatory Visit | Attending: Oncology | Admitting: Oncology

## 2020-04-07 ENCOUNTER — Other Ambulatory Visit: Payer: Self-pay

## 2020-04-07 DIAGNOSIS — Z87891 Personal history of nicotine dependence: Secondary | ICD-10-CM | POA: Diagnosis not present

## 2020-04-07 DIAGNOSIS — Z122 Encounter for screening for malignant neoplasm of respiratory organs: Secondary | ICD-10-CM | POA: Diagnosis not present

## 2020-04-11 ENCOUNTER — Ambulatory Visit (HOSPITAL_BASED_OUTPATIENT_CLINIC_OR_DEPARTMENT_OTHER): Payer: Medicare HMO | Admitting: Student in an Organized Health Care Education/Training Program

## 2020-04-11 ENCOUNTER — Ambulatory Visit
Admission: RE | Admit: 2020-04-11 | Discharge: 2020-04-11 | Disposition: A | Discharge status: Home / Self Care | Payer: Medicare HMO | Source: Ambulatory Visit | Attending: Student in an Organized Health Care Education/Training Program | Admitting: Student in an Organized Health Care Education/Training Program

## 2020-04-11 ENCOUNTER — Encounter: Payer: Self-pay | Admitting: Student in an Organized Health Care Education/Training Program

## 2020-04-11 ENCOUNTER — Other Ambulatory Visit: Payer: Self-pay

## 2020-04-11 VITALS — BP 100/81 | HR 79 | Temp 97.9°F | Resp 13 | Ht 62.0 in | Wt 115.0 lb

## 2020-04-11 DIAGNOSIS — G894 Chronic pain syndrome: Secondary | ICD-10-CM | POA: Insufficient documentation

## 2020-04-11 DIAGNOSIS — M47816 Spondylosis without myelopathy or radiculopathy, lumbar region: Secondary | ICD-10-CM | POA: Diagnosis not present

## 2020-04-11 MED ORDER — FENTANYL CITRATE (PF) 100 MCG/2ML IJ SOLN
25.0000 ug | INTRAMUSCULAR | Status: DC | PRN
  Administered 2020-04-11: 25 ug via INTRAVENOUS

## 2020-04-11 MED ORDER — LIDOCAINE HCL 2 % IJ SOLN
INTRAMUSCULAR | Status: AC
  Filled 2020-04-11: qty 20

## 2020-04-11 MED ORDER — IOHEXOL 180 MG/ML  SOLN: 10.0000 mL | Freq: Once | INTRAMUSCULAR | Status: DC

## 2020-04-11 MED ORDER — DEXAMETHASONE SODIUM PHOSPHATE 10 MG/ML IJ SOLN
INTRAMUSCULAR | Status: AC
  Filled 2020-04-11: qty 1

## 2020-04-11 MED ORDER — LIDOCAINE HCL 2 % IJ SOLN
20.0000 mL | Freq: Once | INTRAMUSCULAR | Status: AC
  Administered 2020-04-11: 400 mg

## 2020-04-11 MED ORDER — FENTANYL CITRATE (PF) 100 MCG/2ML IJ SOLN
INTRAMUSCULAR | Status: AC
  Filled 2020-04-11: qty 2

## 2020-04-11 MED ORDER — DEXAMETHASONE SODIUM PHOSPHATE 10 MG/ML IJ SOLN
10.0000 mg | Freq: Once | INTRAMUSCULAR | Status: AC
  Administered 2020-04-11: 10 mg

## 2020-04-11 MED ORDER — MIDAZOLAM HCL 5 MG/5ML IJ SOLN
1.0000 mg | INTRAMUSCULAR | Status: DC | PRN
  Administered 2020-04-11: 1 mg via INTRAVENOUS

## 2020-04-11 MED ORDER — TRAMADOL HCL 100 MG PO TABS: 1.5000 | ORAL_TABLET | Freq: Two times a day (BID) | ORAL | 1 refills | Status: DC | PRN

## 2020-04-11 MED ORDER — MIDAZOLAM HCL 5 MG/5ML IJ SOLN
INTRAMUSCULAR | Status: AC
  Filled 2020-04-11: qty 5

## 2020-04-11 MED ORDER — ROPIVACAINE HCL 2 MG/ML IJ SOLN
INTRAMUSCULAR | Status: AC
  Filled 2020-04-11: qty 10

## 2020-04-11 MED ORDER — ROPIVACAINE HCL 2 MG/ML IJ SOLN
9.0000 mL | Freq: Once | INTRAMUSCULAR | Status: AC
  Administered 2020-04-11: 9 mL via PERINEURAL

## 2020-04-11 NOTE — Progress Notes (Signed)
PROVIDER NOTE: Information contained herein reflects review and annotations entered in association with encounter. Interpretation of such information and data should be left to medically-trained personnel. Information provided to patient can be located elsewhere in the medical record under "Patient Instructions". Document created using STT-dictation technology, any transcriptional errors that may result from process are unintentional.    Patient: Melissa Montes  Service Category: Procedure  Provider: Gillis Santa, MD  DOB: 1956/03/24  DOS: 04/11/2020  Location: [facility]  MRN: [id number]  Setting: Ambulatory - outpatient  Referring Provider: Perrin Maltese, MD  Type: Established Patient  Specialty: Interventional Pain Management  PCP: Perrin Maltese, MD   Primary Reason for Visit: Interventional Pain Management Treatment. CC: Back Pain (low)  Procedure:          Anesthesia, Analgesia, Anxiolysis:  Type: Thermal Lumbar Facet, Medial Branch Radiofrequency Ablation/Neurotomy  #1  Primary Purpose: Therapeutic Region: Posterolateral Lumbosacral Spine Level: L3, L4, L5, Medial Branch Level(s). These levels will denervate the L3-4, L4-5, lumbar facet joints. Laterality: Right  Type: Moderate (Conscious) Sedation combined with Local Anesthesia Indication(s): Analgesia and Anxiety Route: Intravenous (IV) IV Access: Secured Sedation: Meaningful verbal contact was maintained at all times during the procedure  Local Anesthetic: Lidocaine 1-2%  Position: Prone   Indications: But vascular at the 1. Lumbar facet arthropathy   2. Lumbar spondylosis   3. Chronic pain syndrome    Melissa Montes has been dealing with the above chronic pain for longer than three months and has either failed to respond, was unable to tolerate, or simply did not get enough benefit from other more conservative therapies including, but not limited to: 1. Over-the-counter medications 2. Anti-inflammatory  medications 3. Muscle relaxants 4. Membrane stabilizers 5. Opioids 6. Physical therapy and/or chiropractic manipulation 7. Modalities (Heat, ice, etc.) 8. Invasive techniques such as nerve blocks. [name] has attained more than 50% relief of the pain from a series of diagnostic injections conducted in separate occasions.  Pain Score: Pre-procedure: 5 /10 Post-procedure: 0-No pain/10  Pre-op Assessment:  Melissa Montes is a 64 y.o. (year old), female patient, seen today for interventional treatment. She  has a past surgical history that includes Coronary angioplasty with stent; Abdominal hysterectomy; CAROTID ANGIOGRAPHY (N/A, 01/13/2018); and Left Heart Cath (Right, 07/07/2018). Melissa Montes has a current medication list which includes the following prescription(s): aspirin ec, atorvastatin, celecoxib, vitamin d3, diphenhydramine, duloxetine, ezetimibe, metoprolol succinate, multivitamin, nitroglycerin, promethazine, sucralfate, symbicort, nystatin, ondansetron, tramadol, tramadol hcl, and trazodone, and the following Facility-Administered Medications: fentanyl, iohexol, and midazolam. Her primarily concern today is the Back Pain (low)  Initial Vital Signs:  Pulse/HCG Rate: 79ECG Heart Rate: 73 Temp: 98.1 F (36.7 C) Resp: 18 BP: 103/70 SpO2: 100 %  BMI: Estimated body mass index is 21.03 kg/m as calculated from the following:   Height as of this encounter: 5\' 2"  (1.575 m).   Weight as of this encounter: 115 lb (52.2 kg).  Risk Assessment: Allergies: Reviewed. She is allergic to penicillins; amoxicillin-pot clavulanate; and keflex [cephalexin].  Allergy Precautions: None required Coagulopathies: Reviewed. None identified.  Blood-thinner therapy: None at this time Active Infection(s): Reviewed. None identified. Melissa Montes is afebrile  Site Confirmation: Melissa Montes was asked to confirm the procedure and laterality before marking the site Procedure checklist:  Completed Consent: Before the procedure and under the influence of no sedative(s), amnesic(s), or anxiolytics, the patient was informed of the treatment options, risks and possible complications. To fulfill our ethical and legal obligations, as  recommended by the American Medical Association's Code of Ethics, I have informed the patient of my clinical impression; the nature and purpose of the treatment or procedure; the risks, benefits, and possible complications of the intervention; the alternatives, including doing nothing; the risk(s) and benefit(s) of the alternative treatment(s) or procedure(s); and the risk(s) and benefit(s) of doing nothing. The patient was provided information about the general risks and possible complications associated with the procedure. These may include, but are not limited to: failure to achieve desired goals, infection, bleeding, organ or nerve damage, allergic reactions, paralysis, and death. In addition, the patient was informed of those risks and complications associated to Spine-related procedures, such as failure to decrease pain; infection (i.e.: Meningitis, epidural or intraspinal abscess); bleeding (i.e.: epidural hematoma, subarachnoid hemorrhage, or any other type of intraspinal or peri-dural bleeding); organ or nerve damage (i.e.: Any type of peripheral nerve, nerve root, or spinal cord injury) with subsequent damage to sensory, motor, and/or autonomic systems, resulting in permanent pain, numbness, and/or weakness of one or several areas of the body; allergic reactions; (i.e.: anaphylactic reaction); and/or death. Furthermore, the patient was informed of those risks and complications associated with the medications. These include, but are not limited to: allergic reactions (i.e.: anaphylactic or anaphylactoid reaction(s)); adrenal axis suppression; blood sugar elevation that in diabetics may result in ketoacidosis or comma; water retention that in patients with history  of congestive heart failure may result in shortness of breath, pulmonary edema, and decompensation with resultant heart failure; weight gain; swelling or edema; medication-induced neural toxicity; particulate matter embolism and blood vessel occlusion with resultant organ, and/or nervous system infarction; and/or aseptic necrosis of one or more joints. Finally, the patient was informed that Medicine is not an exact science; therefore, there is also the possibility of unforeseen or unpredictable risks and/or possible complications that may result in a catastrophic outcome. The patient indicated having understood very clearly. We have given the patient no guarantees and we have made no promises. Enough time was given to the patient to ask questions, all of which were answered to the patient's satisfaction. Ms. Howser has indicated that she wanted to continue with the procedure. Attestation: I, the ordering provider, attest that I have discussed with the patient the benefits, risks, side-effects, alternatives, likelihood of achieving goals, and potential problems during recovery for the procedure that I have provided informed consent. Date  Time: 04/11/2020  7:57 AM  Pre-Procedure Preparation:  Monitoring: As per clinic protocol. Respiration, ETCO2, SpO2, BP, heart rate and rhythm monitor placed and checked for adequate function Safety Precautions: Patient was assessed for positional comfort and pressure points before starting the procedure. Time-out: I initiated and conducted the "Time-out" before starting the procedure, as per protocol. The patient was asked to participate by confirming the accuracy of the "Time Out" information. Verification of the correct person, site, and procedure were performed and confirmed by me, the nursing staff, and the patient. "Time-out" conducted as per Joint Commission's Universal Protocol (UP.01.01.01). Time: 0834  Description of Procedure:          Laterality:  Right Levels:  L3, L4, L5, Medial Branch Level(s), at the L3-4, L4-5 lumbar facet joints. Area Prepped: Lumbosacral DuraPrep (Iodine Povacrylex [0.7% available iodine] and Isopropyl Alcohol, 74% w/w) Safety Precautions: Aspiration looking for blood return was conducted prior to all injections. At no point did we inject any substances, as a needle was being advanced. Before injecting, the patient was told to immediately notify me if she was experiencing  any new onset of "ringing in the ears, or metallic taste in the mouth". No attempts were made at seeking any paresthesias. Safe injection practices and needle disposal techniques used. Medications properly checked for expiration dates. SDV (single dose vial) medications used. After the completion of the procedure, all disposable equipment used was discarded in the proper designated medical waste containers. Local Anesthesia: Protocol guidelines were followed. The patient was positioned over the fluoroscopy table. The area was prepped in the usual manner. The time-out was completed. The target area was identified using fluoroscopy. A 12-in long, straight, sterile hemostat was used with fluoroscopic guidance to locate the targets for each level blocked. Once located, the skin was marked with an approved surgical skin marker. Once all sites were marked, the skin (epidermis, dermis, and hypodermis), as well as deeper tissues (fat, connective tissue and muscle) were infiltrated with a small amount of a short-acting local anesthetic, loaded on a 10cc syringe with a 25G, 1.5-in  Needle. An appropriate amount of time was allowed for local anesthetics to take effect before proceeding to the next step. Local Anesthetic: Lidocaine 2.0% The unused portion of the local anesthetic was discarded in the proper designated containers. Technical explanation of process:  Radiofrequency Ablation (RFA)  L3 Medial Branch Nerve RFA: The target area for the L3 medial branch is at  the junction of the postero-lateral aspect of the superior articular process and the superior, posterior, and medial edge of the transverse process of L4. Under fluoroscopic guidance, a Radiofrequency needle was inserted until contact was made with os over the superior postero-lateral aspect of the pedicular shadow (target area). Sensory and motor testing was conducted to properly adjust the position of the needle. Once satisfactory placement of the needle was achieved, the numbing solution was slowly injected after negative aspiration for blood. 2.0 mL of the nerve block solution was injected without difficulty or complication. After waiting for at least 3 minutes, the ablation was performed. Once completed, the needle was removed intact. L4 Medial Branch Nerve RFA: The target area for the L4 medial branch is at the junction of the postero-lateral aspect of the superior articular process and the superior, posterior, and medial edge of the transverse process of L5. Under fluoroscopic guidance, a Radiofrequency needle was inserted until contact was made with os over the superior postero-lateral aspect of the pedicular shadow (target area). Sensory and motor testing was conducted to properly adjust the position of the needle. Once satisfactory placement of the needle was achieved, the numbing solution was slowly injected after negative aspiration for blood. 2.0 mL of the nerve block solution was injected without difficulty or complication. After waiting for at least 3 minutes, the ablation was performed. Once completed, the needle was removed intact. L5 Medial Branch Nerve RFA: The target area for the L5 medial branch is at the junction of the postero-lateral aspect of the superior articular process of S1 and the superior, posterior, and medial edge of the sacral ala. Under fluoroscopic guidance, a Radiofrequency needle was inserted until contact was made with os over the superior postero-lateral aspect of the  pedicular shadow (target area). Sensory and motor testing was conducted to properly adjust the position of the needle. Once satisfactory placement of the needle was achieved, the numbing solution was slowly injected after negative aspiration for blood. 2.0 mL of the nerve block solution was injected without difficulty or complication. After waiting for at least 3 minutes, the ablation was performed. Once completed, the needle was removed intact.  Radiofrequency lesioning (ablation):  Radiofrequency Generator: NeuroTherm [id number] Sensory Stimulation Parameters: 50 Hz was used to locate & identify the nerve, making sure that the needle was positioned such that there was no sensory stimulation below 0.3 V or above 0.7 V. Motor Stimulation Parameters: 2 Hz was used to evaluate the motor component. Care was taken not to lesion any nerves that demonstrated motor stimulation of the lower extremities at an output of less than 2.5 times that of the sensory threshold, or a maximum of 2.0 V. Lesioning Technique Parameters: Standard Radiofrequency settings. (Not bipolar or pulsed.) Temperature Settings: 80 degrees C Lesioning time: 60 seconds Intra-operative Compliance: Compliant Materials & Medications: Needle(s) (Electrode/Cannula) Type: Teflon-coated, curved tip, Radiofrequency needle(s) Gauge: 22G Length: 10cm Numbing solution: 6 cc solution made of 5 cc of 0.2% ropivacaine, 1 cc of Decadron 10 mg/cc.  2 cc injected at each level above after sensorimotor testing prior to lesioning.   Once the entire procedure was completed, the treated area was cleaned, making sure to leave some of the prepping solution back to take advantage of its long term bactericidal properties.  Illustration of the posterior view of the lumbar spine and the posterior neural structures. Laminae of L2 through S1 are labeled. DPRL5, dorsal primary ramus of L5; DPRS1, dorsal primary ramus of S1; DPR3, dorsal primary ramus of L3; FJ, facet  (zygapophyseal) joint L3-L4; I, inferior articular process of L4; LB1, lateral branch of dorsal primary ramus of L1; IAB, inferior articular branches from L3 medial branch (supplies L4-L5 facet joint); IBP, intermediate branch plexus; MB3, medial branch of dorsal primary ramus of L3; NR3, third lumbar nerve root; S, superior articular process of L5; SAB, superior articular branches from L4 (supplies L4-5 facet joint also); TP3, transverse process of L3.  Vitals:   04/11/20 0850 04/11/20 0853 04/11/20 0903 04/11/20 0913  BP: 121/71 117/79 91/65 107/83  Pulse:      Resp: 14 13 12 11   Temp:   98.1 F (36.7 C)   SpO2: 100% 99% 94% 100%  Weight:      Height:       Start Time: 0834 hrs. End Time: 0853 hrs.  Imaging Guidance (Spinal):          Type of Imaging Technique: Fluoroscopy Guidance (Spinal) Indication(s): Assistance in needle guidance and placement for procedures requiring needle placement in or near specific anatomical locations not easily accessible without such assistance. Exposure Time: Please see nurses notes. Contrast: None used. Fluoroscopic Guidance: I was personally present during the use of fluoroscopy. "Tunnel Vision Technique" used to obtain the best possible view of the target area. Parallax error corrected before commencing the procedure. "Direction-depth-direction" technique used to introduce the needle under continuous pulsed fluoroscopy. Once target was reached, antero-posterior, oblique, and lateral fluoroscopic projection used confirm needle placement in all planes. Images permanently stored in EMR. Interpretation: No contrast injected. I personally interpreted the imaging intraoperatively. Adequate needle placement confirmed in multiple planes. Permanent images saved into the patient's record.  Antibiotic Prophylaxis:   Anti-infectives (From admission, onward)   None     Indication(s): None identified  Post-operative Assessment:  Post-procedure Vital Signs:    Pulse/HCG Rate: 7963 Temp: 98.1 F (36.7 C) Resp: 11 BP: 107/83 SpO2: 100 %  EBL: None  Complications: No immediate post-treatment complications observed by team, or reported by patient.  Note: The patient tolerated the entire procedure well. A repeat set of vitals were taken after the procedure and the patient was kept under observation following institutional  policy, for this type of procedure. Post-procedural neurological assessment was performed, showing return to baseline, prior to discharge. The patient was provided with post-procedure discharge instructions, including a section on how to identify potential problems. Should any problems arise concerning this procedure, the patient was given instructions to immediately contact us, at any time, without hesitation. In any case, we plan to contact the patient by telephone for a follow-up status report regarding this interventional procedure.  Comments:  No additional relevant information.  Plan of Care  Orders:  Orders Placed This Encounter  Procedures  . DG [facility] C-ARM 1-60 MIN NO REPORT    Intraoperative interpretation by procedural physician at [place].    Standing Status:   Standing    Number of Occurrences:   1    Order Specific Question:   Reason for exam:    Answer:   Assistance in needle guidance and placement for procedures requiring needle placement in or near specific anatomical locations not easily accessible without such assistance.   Refill tramadol as below.  100 mg-150 mg twice daily as needed.  PMP checked and reviewed.  Medications ordered for procedure: Meds ordered this encounter  Medications  . iohexol (OMNIPAQUE) 180 MG/ML injection 10 mL    Must be Myelogram-compatible. If not available, you may substitute with a water-soluble, non-ionic, hypoallergenic, myelogram-compatible radiological contrast medium.  Marland Kitchen lidocaine (XYLOCAINE) 2 % (with pres) injection 400 mg  . fentaNYL (SUBLIMAZE)  injection 25-50 mcg    Make sure Narcan is available in the pyxis when using this medication. In the event of respiratory depression (RR< 8/min): Titrate NARCAN (naloxone) in increments of 0.1 to 0.2 mg IV at 2-3 minute intervals, until desired degree of reversal.  . dexamethasone (DECADRON) injection 10 mg  . ropivacaine (PF) 2 mg/mL (0.2%) (NAROPIN) injection 9 mL  . midazolam (VERSED) 5 MG/5ML injection 1-2 mg    Make sure Flumazenil is available in the pyxis when using this medication. If oversedation occurs, administer 0.2 mg IV over 15 sec. If after 45 sec no response, administer 0.2 mg again over 1 min; may repeat at 1 min intervals; not to exceed 4 doses (1 mg)  . traMADol HCl 100 MG TABS    Sig: Take 1.5 tablets by mouth 2 (two) times daily as needed.    Dispense:  90 tablet    Refill:  1   Medications administered: We administered lidocaine, fentaNYL, dexamethasone, ropivacaine (PF) 2 mg/mL (0.2%), and midazolam.  See the medical record for exact dosing, route, and time of administration.  Follow-up plan:   Return in about 7 weeks (around 05/30/2020) for Post Procedure Evaluation, in person.      Status post bilateral L3, L4, L5 facet medial branch nerve block #2 3/15/202, 90% pain relief for 10-12 hrs, proceed with RFA.  Left L3, L4, L5 RFA 03/14/2020; Right L3,4,5 RFA 04/11/20.      Recent Visits Date Type Provider Dept  03/14/20 Procedure visit Gillis Santa, MD [facility]  02/16/20 Office Visit Gillis Santa, MD [facility]  02/01/20 Procedure visit Gillis Santa, MD [facility]  01/26/20 Office Visit Gillis Santa, MD [facility]  Showing recent visits within past 90 days and meeting all other requirements   Today's Visits Date Type Provider Dept  04/11/20 Procedure visit Gillis Santa, MD [facility]  Showing today's visits and meeting all other requirements   Future Appointments Date Type Provider Dept  05/30/20  Appointment Gillis Santa,  MD [facility]  Showing future appointments within next 90 days and meeting all other requirements   Disposition: Discharge home  Discharge (Date  Time): 04/11/2020;   hrs.   Primary Care Physician: Perrin Maltese, MD Location: [facility] Note by: Gillis Santa, MD Date: 04/11/2020; Time: 9:30 AM  Disclaimer:  Medicine is not an exact science. The only guarantee in medicine is that nothing is guaranteed. It is important to note that the decision to proceed with this intervention was based on the information collected from the patient. The Data and conclusions were drawn from the patient's questionnaire, the interview, and the physical examination. Because the information was provided in large part by the patient, it cannot be guaranteed that it has not been purposely or unconsciously manipulated. Every effort has been made to obtain as much relevant data as possible for this evaluation. It is important to note that the conclusions that lead to this procedure are derived in large part from the available data. Always take into account that the treatment will also be dependent on availability of resources and existing treatment guidelines, considered by other Pain Management Practitioners as being common knowledge and practice, at the time of the intervention. For Medico-Legal purposes, it is also important to point out that variation in procedural techniques and pharmacological choices are the acceptable norm. The indications, contraindications, technique, and results of the above procedure should only be interpreted and judged by a Board-Certified Interventional Pain Specialist with extensive familiarity and expertise in the same exact procedure and technique.

## 2020-04-11 NOTE — Patient Instructions (Signed)

## 2020-04-11 NOTE — Progress Notes (Signed)
Safety precautions to be maintained throughout the outpatient stay will include: orient to surroundings, keep bed in low position, maintain call bell within reach at all times, provide assistance with transfer out of bed and ambulation.  

## 2020-04-12 ENCOUNTER — Telehealth: Payer: Self-pay

## 2020-04-12 ENCOUNTER — Encounter: Payer: Self-pay | Admitting: *Deleted

## 2020-04-12 ENCOUNTER — Ambulatory Visit: Payer: Medicare HMO | Admitting: Internal Medicine

## 2020-04-12 NOTE — Telephone Encounter (Signed)
No answer. Left message pp and instructed to call if needed.

## 2020-04-26 ENCOUNTER — Encounter: Payer: Self-pay | Admitting: Psychiatry

## 2020-04-26 ENCOUNTER — Other Ambulatory Visit: Payer: Self-pay

## 2020-04-26 ENCOUNTER — Telehealth (INDEPENDENT_AMBULATORY_CARE_PROVIDER_SITE_OTHER): Payer: Medicare HMO | Admitting: Psychiatry

## 2020-04-26 DIAGNOSIS — F3131 Bipolar disorder, current episode depressed, mild: Secondary | ICD-10-CM | POA: Insufficient documentation

## 2020-04-26 DIAGNOSIS — F411 Generalized anxiety disorder: Secondary | ICD-10-CM

## 2020-04-26 DIAGNOSIS — I6523 Occlusion and stenosis of bilateral carotid arteries: Secondary | ICD-10-CM | POA: Diagnosis not present

## 2020-04-26 DIAGNOSIS — G4700 Insomnia, unspecified: Secondary | ICD-10-CM

## 2020-04-26 DIAGNOSIS — R69 Illness, unspecified: Secondary | ICD-10-CM | POA: Diagnosis not present

## 2020-04-26 MED ORDER — MIRTAZAPINE 15 MG PO TABS: 7.5000 mg | ORAL_TABLET | Freq: Every day | ORAL | 1 refills | Status: DC

## 2020-04-26 MED ORDER — HYDROXYZINE HCL 10 MG PO TABS: 10.0000 mg | ORAL_TABLET | Freq: Every day | ORAL | 1 refills | Status: DC | PRN

## 2020-04-26 NOTE — Progress Notes (Signed)
Provider Location : [facility] Patient Location : Home  Virtual Visit via Video Note  I connected with Melissa Montes on 04/26/20 at  1:00 PM EDT by a video enabled telemedicine application and verified that I am speaking with the correct person using two identifiers.   I discussed the limitations of evaluation and management by telemedicine and the availability of in person appointments. The patient expressed understanding and agreed to proceed.    I discussed the assessment and treatment plan with the patient. The patient was provided an opportunity to ask questions and all were answered. The patient agreed with the plan and demonstrated an understanding of the instructions.   The patient was advised to call back or seek an in-person evaluation if the symptoms worsen or if the condition fails to improve as anticipated.  Psychiatric Initial Adult Assessment   Patient Identification: Melissa Montes MRN:  [id number] Date of Evaluation:  04/26/2020 Referral Source: Lamonte Sakai MD Chief Complaint:   Chief Complaint    Establish Care     Visit Diagnosis:    ICD-10-CM   1. GAD (generalized anxiety disorder)  F41.1 hydrOXYzine (ATARAX/VISTARIL) 10 MG tablet    mirtazapine (REMERON) 15 MG tablet  2. Bipolar disorder, current episode depressed, mild ([place])  F31.31   3. Insomnia, unspecified type  G47.00 mirtazapine (REMERON) 15 MG tablet    History of Present Illness:  Melissa Montes is a 64 year old Caucasian female, on disability, divorced, lives in [place], has a history of bipolar disorder, anxiety disorder, coronary artery disease, status post stent placement, history of myocardial infarction, chronic pain, diverticulitis was evaluated by telemedicine today.  Patient today reports she has a history of bipolar disorder and anxiety disorder and was under the care of Dr. Randel Books at [facility]  Patient reports her provider passed away and hence she transitioned her care to her primary care  provider who referred her to our clinic.  She reports she is currently struggling with anxiety symptoms.  She reports she is a Research officer, trade union and worries about everything to the extreme.  She is often nervous and restless.  She reports she also does not like to leave her house anymore since the pandemic began.  She does not know why.  She does have a history of panic attacks however has had it only 2 or 3 times in her whole life.  She did have a prescription for Valium from her previous prescriber however she does not use it much and is okay without it.  Patient reports a history of mood swings in the past.  She reports in her early 65s she had manic episodes where she was very hyperactive, energetic, did not need to sleep, would spend a lot of money and felt grandiose.  She however reports she has not had a manic episode in a long time.  She currently reports possible depressive symptoms,.  She reports lack of motivation, low energy and low mood.  She also reports being socially withdrawn from her friends.  This has been getting worse since the past few months.  She does report sleep issues.  She has difficulty falling asleep and staying asleep.  She was prescribed trazodone previously however she reports it did not help at all.  She also tried Benadryl and melatonin over-the-counter which also did not work.  Patient denies any history of trauma.  She denies any OCD symptoms.  Patient denies any substance abuse problems.  Patient does report chronic pain which is a major stressor  for her.  She reports she has difficulty standing or walking for too long.  She rates her pain at a 5 out of 10 now, 10 being the worst.  She is currently on tramadol which helps to some extent.  She is currently under the care of pain management.    Associated Signs/Symptoms: Depression Symptoms:  depressed mood, insomnia, Social withdrawal (Hypo) Manic Symptoms:  history of mood swings, manic sx in the past Anxiety  Symptoms:  Excessive Worry, Panic Symptoms, Psychotic Symptoms:  denies PTSD Symptoms: Negative  Past Psychiatric History: Past history of bipolar disorder, anxiety disorder.  Patient was under the care of Dr. Randel Books at [facility].  Patient denies any history of suicide attempts.  Previous Psychotropic Medications: Yes Multiple medication trials in the past-Prozac, Cymbalta, trazodone  Substance Abuse History in the last 12 months:  No.  Consequences of Substance Abuse: Negative  Past Medical History:  Past Medical History:  Diagnosis Date  . Anxiety   . Bipolar disorder ([place])   . Depression   . Diverticulitis   . Heart disease   . Hyperlipidemia   . MI (myocardial infarction) [facility])     Past Surgical History:  Procedure Laterality Date  . ABDOMINAL HYSTERECTOMY    . CAROTID ANGIOGRAPHY N/A 01/13/2018   Procedure: CAROTID ANGIOGRAPHY;  Surgeon: Algernon Huxley, MD;  Location: [facility];  Service: Cardiovascular;  Laterality: N/A;  . CORONARY ANGIOPLASTY WITH STENT PLACEMENT    . LEFT HEART CATH Right 07/07/2018   Procedure: Left Heart Cath and Coronary Angiography;  Surgeon: Dionisio David, MD;  Location: [facility];  Service: Cardiovascular;  Laterality: Right;    Family Psychiatric History: As noted below-sister-depression.  Family History:  Family History  Problem Relation Age of Onset  . Breast cancer Mother 42  . Breast cancer Maternal Aunt        mat aunt and great aunt  . Depression Sister     Social History:   Social History   Socioeconomic History  . Marital status: Divorced    Spouse name: Not on file  . Number of children: Not on file  . Years of education: Not on file  . Highest education level: Not on file  Occupational History  . Occupation: disabled  Tobacco Use  . Smoking status: Former Smoker    Packs/day: 1.00    Years: 41.00    Pack years: 41.00    Types: Cigarettes    Quit date: 09/19/2018    Years since quitting: 1.6  .  Smokeless tobacco: Never Used  . Tobacco comment: uses nicotine gums  Substance and Sexual Activity  . Alcohol use: No  . Drug use: No  . Sexual activity: Not on file  Other Topics Concern  . Not on file  Social History Narrative  . Not on file   Social Determinants of Health   Financial Resource Strain:   . Difficulty of Paying Living Expenses:   Food Insecurity:   . Worried About Charity fundraiser in the Last Year:   . Arboriculturist in the Last Year:   Transportation Needs:   . Film/video editor (Medical):   Marland Kitchen Lack of Transportation (Non-Medical):   Physical Activity:   . Days of Exercise per Week:   . Minutes of Exercise per Session:   Stress:   . Feeling of Stress :   Social Connections:   . Frequency of Communication with Friends and Family:   .  Frequency of Social Gatherings with Friends and Family:   . Attends Religious Services:   . Active Member of Clubs or Organizations:   . Attends Archivist Meetings:   Marland Kitchen Marital Status:     Additional Social History: Patient reports she was born in [place].  She was raised by both parents.  She had a good childhood.  She graduated high school and did some college.  She got divorced and moved to [place] more than 20 years ago to be with her family.  Both her parents are currently deceased.  She had 2 sisters and 1 brother.  One of her sisters passed away.  She denies having children.  She is currently on disability.  She denies any history of trauma. Allergies:   Allergies  Allergen Reactions  . Penicillins Swelling  . Amoxicillin-Pot Clavulanate   . Keflex [Cephalexin] Hives    Metabolic Disorder Labs: No results found for: HGBA1C, MPG No results found for: PROLACTIN No results found for: CHOL, TRIG, HDL, CHOLHDL, VLDL, LDLCALC No results found for: TSH  Therapeutic Level Labs: No results found for: LITHIUM No results found for: CBMZ No results found for: VALPROATE  Current  Medications: Current Outpatient Medications  Medication Sig Dispense Refill  . aspirin EC 81 MG tablet Take 162 mg by mouth every evening.     Marland Kitchen atorvastatin (LIPITOR) 80 MG tablet Take 80 mg by mouth every evening.     . Cholecalciferol (VITAMIN D3) 1.25 MG (50000 UT) CAPS Take 1 tablet by mouth once a week.    . DULoxetine (CYMBALTA) 60 MG capsule Take 60 mg by mouth daily.    Marland Kitchen ezetimibe (ZETIA) 10 MG tablet Take 10 mg by mouth daily.     . metoprolol succinate (TOPROL-XL) 25 MG 24 hr tablet Take 50 mg by mouth daily.     . Multiple Vitamin (MULTIVITAMIN) tablet Take 1 tablet by mouth daily. Centrum Silver for Women    . nitroGLYCERIN (NITROSTAT) 0.4 MG SL tablet Place 0.4 mg under the tongue every 5 (five) minutes x 3 doses as needed for chest pain.     . promethazine (PHENERGAN) 25 MG tablet Take 25 mg by mouth every 8 (eight) hours as needed for nausea or vomiting.    . SYMBICORT 80-4.5 MCG/ACT inhaler SMARTSIG:1 Puff(s) By Mouth Every 12 Hours    . traMADol HCl 100 MG TABS Take 1.5 tablets by mouth 2 (two) times daily as needed. 90 tablet 1  . celecoxib (CELEBREX) 200 MG capsule Take 200 mg by mouth daily.    . clopidogrel (PLAVIX) 75 MG tablet Take 75 mg by mouth daily.    . hydrOXYzine (ATARAX/VISTARIL) 10 MG tablet Take 1 tablet (10 mg total) by mouth daily as needed. 30 tablet 1  . mirtazapine (REMERON) 15 MG tablet Take 0.5-1 tablets (7.5-15 mg total) by mouth at bedtime. For sleep 30 tablet 1  . nystatin (MYCOSTATIN) 100000 UNIT/ML suspension Take 5 mLs by mouth 4 (four) times daily as needed.    . ondansetron (ZOFRAN) 4 MG tablet Take 1 tablet by mouth 4 (four) times daily.    . sucralfate (CARAFATE) 1 g tablet Take 1 g by mouth 4 (four) times daily -  with meals and at bedtime.    . traMADol (ULTRAM) 50 MG tablet Take 100 mg by mouth every 12 (twelve) hours as needed.     No current facility-administered medications for this visit.    Musculoskeletal: Strength & Muscle  Tone:  [place]: normal Patient leans: N/A  Psychiatric Specialty Exam: Review of Systems  Musculoskeletal: Positive for back pain.  Psychiatric/Behavioral: Positive for dysphoric mood and sleep disturbance. The patient is nervous/anxious.   All other systems reviewed and are negative.   There were no vitals taken for this visit.There is no height or weight on file to calculate BMI.  General Appearance: Casual  Eye Contact:  Fair  Speech:  Clear and Coherent  Volume:  Normal  Mood:  Anxious and Depressed  Affect:  Congruent  Thought Process:  Goal Directed and Descriptions of Associations: Intact  Orientation:  Full (Time, Place, and Person)  Thought Content:  Logical  Suicidal Thoughts:  No  Homicidal Thoughts:  No  Memory:  Immediate;   Fair Recent;   Fair Remote;   Fair  Judgement:  Fair  Insight:  Fair  Psychomotor Activity:  Normal  Concentration:  Concentration: Fair and Attention Span: Fair  Recall:  [employer] of Knowledge:Fair  Language: Fair  Akathisia:  No  Handed:  Right  AIMS (if indicated):  UTA  Assets:  Communication Skills Desire for Improvement Housing Social Support  ADL's:  Intact  Cognition: WNL  Sleep:  Poor   Screenings: PHQ2-9     Procedure visit from 04/11/2020 in [place] Procedure visit from 03/14/2020 in [facility] from 11/20/2018 in [facility]  PHQ-2 Total Score  0  0  4  PHQ-9 Total Score  --  --  13      Assessment and Plan: Melissa Montes is a 64 year old Caucasian female, disabled, divorced, lives in [place], has a history of bipolar disorder, anxiety disorder, MI, status post stent placement, diverticulitis, chronic pain was evaluated by telemedicine today.  Patient is currently struggling with anxiety, sleep issues and possible depressive symptoms.  She is biologically predisposed given her multiple  medical problems including pain, history of mental health problems in her family.  Patient also with psychosocial stressors of being divorced, limited social support and the pandemic.  Patient will benefit from medication readjustment and psychotherapy sessions.  Plan as noted below.  Plan GAD-some progress Cymbalta 60 mg p.o. daily Start hydroxyzine 10 mg p.o. daily as needed for severe anxiety attacks Discussed referral for CBT-she declined.  Bipolar disorder current episode depressed-mild-unstable Cymbalta 60 mg p.o. daily We will start Remeron 7.5 to 15 mg p.o. nightly for sleep Discussed drug to drug interaction between tramadol and her antidepressants. We will consider adding a mood stabilizer-however will request medical records from her previous provider at [facility].  Discussed with patient to sign a release to obtain most recent labs from her primary care provider.  She will benefit from the following labs-TSH, lipid panel, hemoglobin A1c, CMP, CBC with differential.  Will also benefit from EKG-she will fax it to [place] from her primary care provider.  Follow-up in clinic in 4 to 6 weeks or sooner if needed.  I have spent atleast 60 minutes non face to face with patient today. More than 50 % of the time was spent for preparing to see the patient ( e.g., review of test, records ), obtaining and to review and separately obtained history , ordering medications and test ,psychoeducation and supportive psychotherapy and care coordination,as well as documenting clinical information in electronic health record This note was generated in part or whole with voice recognition software. Voice recognition is usually  quite accurate but there are transcription errors that can and very often do occur. I apologize for any typographical errors that were not detected and corrected.        Ursula Alert, MD 6/8/20218:35 PM

## 2020-04-27 ENCOUNTER — Telehealth: Payer: Self-pay | Admitting: Psychiatry

## 2020-04-27 NOTE — Telephone Encounter (Signed)
I have reviewed labs received-11/19/2018 - 04/26/2020 from [facility]. Report date-01/20/2019 CMP within normal limits except for total protein low at 5.7, A5 G ratio high at 2.6.  CBC with differential-within normal limits except for eosinophils high at 0.6.  Lipid panel-total cholesterol-within normal limits.  Report date 08/12/2019.  CMP within normal limits except for alkaline phosphatase high at 123.  AST ALT-within normal limits.  CBC with differential within normal limits.  Lipid panel-within normal limits.  11/25/2019 report date CMP-within normal limits. CBC with differential-within normal limits. Lipid panel-within normal limits.  TSH-dated 11/25/2019-1.360-within normal limits.  Vitamin D-low-29.7-11/25/2019.  Vitamin B12-451-within normal limits.  Report date 01/14/2020-CBC with differential-WBC high at 13.1, neutrophils high at 9.7, monocytes high at 1.0  Report date 01/14/2020-CMP-sodium high at 145 otherwise within normal limits  Report date 02/16/2020-WBC-high at 31.  Otherwise CBC with differential within normal limits  EKG dated 03/19/2020-sinus bradycardia, T wave abnormality.  Abnormal ECG.  [id number].

## 2020-05-03 ENCOUNTER — Other Ambulatory Visit: Payer: Self-pay

## 2020-05-03 ENCOUNTER — Inpatient Hospital Stay: Payer: Medicare HMO | Attending: Internal Medicine

## 2020-05-03 DIAGNOSIS — D472 Monoclonal gammopathy: Secondary | ICD-10-CM | POA: Diagnosis not present

## 2020-05-03 LAB — COMPREHENSIVE METABOLIC PANEL
ALT: 17 U/L (ref 0–44)
AST: 25 U/L (ref 15–41)
Albumin: 3.9 g/dL (ref 3.5–5.0)
Alkaline Phosphatase: 101 U/L (ref 38–126)
Anion gap: 10 (ref 5–15)
BUN: 23 mg/dL (ref 8–23)
CO2: 27 mmol/L (ref 22–32)
Calcium: 9.4 mg/dL (ref 8.9–10.3)
Chloride: 105 mmol/L (ref 98–111)
Creatinine, Ser: 0.61 mg/dL (ref 0.44–1.00)
GFR calc Af Amer: >60 mL/min (ref >60)
GFR calc non Af Amer: >60 mL/min (ref >60)
Glucose, Bld: 118 mg/dL — ABNORMAL HIGH (ref 70–99)
Potassium: 4.2 mmol/L (ref 3.5–5.1)
Sodium: 142 mmol/L (ref 135–145)
Total Bilirubin: 0.7 mg/dL (ref 0.3–1.2)
Total Protein: 7.1 g/dL (ref 6.5–8.1)

## 2020-05-03 LAB — CBC WITH DIFFERENTIAL/PLATELET
Abs Immature Granulocytes: 0.03 10*3/uL (ref 0.00–0.07)
Basophils Absolute: 0.1 10*3/uL (ref 0.0–0.1)
Basophils Relative: 1 %
Eosinophils Absolute: 0.3 10*3/uL (ref 0.0–0.5)
Eosinophils Relative: 3 %
HCT: 41.5 % (ref 36.0–46.0)
Hemoglobin: 14.4 g/dL (ref 12.0–15.0)
Immature Granulocytes: 0 %
Lymphocytes Relative: 17 %
Lymphs Abs: 1.6 10*3/uL (ref 0.7–4.0)
MCH: 30.3 pg (ref 26.0–34.0)
MCHC: 34.7 g/dL (ref 30.0–36.0)
MCV: 87.2 fL (ref 80.0–100.0)
Monocytes Absolute: 0.6 10*3/uL (ref 0.1–1.0)
Monocytes Relative: 6 %
Neutro Abs: 6.6 10*3/uL (ref 1.7–7.7)
Neutrophils Relative %: 73 %
Platelets: 262 10*3/uL (ref 150–400)
RBC: 4.76 MIL/uL (ref 3.87–5.11)
RDW: 13.2 % (ref 11.5–15.5)
WBC: 9.1 10*3/uL (ref 4.0–10.5)
nRBC: 0 % (ref 0.0–0.2)

## 2020-05-04 LAB — MULTIPLE MYELOMA PANEL, SERUM
Albumin SerPl Elph-Mcnc: 3.3 g/dL (ref 2.9–4.4)
Albumin/Glob SerPl: 1.3 (ref 0.7–1.7)
Alpha 1: 0.3 g/dL (ref 0.0–0.4)
Alpha2 Glob SerPl Elph-Mcnc: 0.9 g/dL (ref 0.4–1.0)
B-Globulin SerPl Elph-Mcnc: 0.8 g/dL (ref 0.7–1.3)
Gamma Glob SerPl Elph-Mcnc: 0.7 g/dL (ref 0.4–1.8)
Globulin, Total: 2.7 g/dL (ref 2.2–3.9)
IgA: 176 mg/dL (ref 87–352)
IgG (Immunoglobin G), Serum: 682 mg/dL (ref 586–1602)
IgM (Immunoglobulin M), Srm: 85 mg/dL (ref 26–217)
Total Protein ELP: 6 g/dL (ref 6.0–8.5)

## 2020-05-04 LAB — KAPPA/LAMBDA LIGHT CHAINS
Kappa free light chain: 13.8 mg/L (ref 3.3–19.4)
Kappa, lambda light chain ratio: 1.27 (ref 0.26–1.65)
Lambda free light chains: 10.9 mg/L (ref 5.7–26.3)

## 2020-05-05 ENCOUNTER — Encounter: Payer: Self-pay | Admitting: Internal Medicine

## 2020-05-06 ENCOUNTER — Inpatient Hospital Stay: Payer: Medicare HMO | Admitting: Internal Medicine

## 2020-05-06 ENCOUNTER — Telehealth: Payer: Self-pay | Admitting: *Deleted

## 2020-05-06 DIAGNOSIS — D472 Monoclonal gammopathy: Secondary | ICD-10-CM

## 2020-05-06 NOTE — Telephone Encounter (Signed)
Patient cancel her apt with Dr. Rogue Bussing. Per Dr. Rogue Bussing. Ok to reschedule this apt out by 1 year. Lab/md- cbc, metc, kappa light chain/myeloma panel. Pt aware of plan.

## 2020-05-20 ENCOUNTER — Telehealth: Payer: Self-pay

## 2020-05-20 DIAGNOSIS — G4700 Insomnia, unspecified: Secondary | ICD-10-CM

## 2020-05-20 DIAGNOSIS — F3131 Bipolar disorder, current episode depressed, mild: Secondary | ICD-10-CM

## 2020-05-20 DIAGNOSIS — F411 Generalized anxiety disorder: Secondary | ICD-10-CM

## 2020-05-20 MED ORDER — ZALEPLON 5 MG PO CAPS: 5.0000 mg | ORAL_CAPSULE | Freq: Every evening | ORAL | 0 refills | Status: DC | PRN

## 2020-05-20 NOTE — Telephone Encounter (Signed)
Pt called left message that she still can not slep

## 2020-05-20 NOTE — Telephone Encounter (Signed)
Patient not sleeping on mirtazapine.  Mirtazapine is also for anxiety.  Hence will ask her to stay on the mirtazapine. Start [place] 5 mg p.o. nightly. Provided medication education. If the combination of mirtazapine and Read Drivers makes her too groggy or she has dizziness or other side effects advised to stop the mirtazapine.

## 2020-05-30 ENCOUNTER — Ambulatory Visit: Payer: Medicare HMO | Admitting: Student in an Organized Health Care Education/Training Program

## 2020-06-13 ENCOUNTER — Telehealth: Payer: Self-pay | Admitting: *Deleted

## 2020-06-14 ENCOUNTER — Other Ambulatory Visit: Payer: Self-pay

## 2020-06-14 ENCOUNTER — Encounter: Payer: Self-pay | Admitting: Psychiatry

## 2020-06-14 ENCOUNTER — Telehealth (INDEPENDENT_AMBULATORY_CARE_PROVIDER_SITE_OTHER): Payer: Medicare HMO | Admitting: Psychiatry

## 2020-06-14 DIAGNOSIS — R69 Illness, unspecified: Secondary | ICD-10-CM | POA: Diagnosis not present

## 2020-06-14 DIAGNOSIS — F3131 Bipolar disorder, current episode depressed, mild: Secondary | ICD-10-CM | POA: Diagnosis not present

## 2020-06-14 DIAGNOSIS — G4700 Insomnia, unspecified: Secondary | ICD-10-CM

## 2020-06-14 DIAGNOSIS — I6523 Occlusion and stenosis of bilateral carotid arteries: Secondary | ICD-10-CM | POA: Diagnosis not present

## 2020-06-14 DIAGNOSIS — F411 Generalized anxiety disorder: Secondary | ICD-10-CM

## 2020-06-14 MED ORDER — TRAZODONE HCL 100 MG PO TABS: 150.0000 mg | ORAL_TABLET | Freq: Every day | ORAL | 1 refills | Status: DC

## 2020-06-14 NOTE — Progress Notes (Signed)
Provider Location : [facility] Patient Location : Home  Virtual Visit via Video Note  I connected with Melissa Montes on 06/14/20 at  4:20 PM EDT by a video enabled telemedicine application and verified that I am speaking with the correct person using two identifiers.   I discussed the limitations of evaluation and management by telemedicine and the availability of in person appointments. The patient expressed understanding and agreed to proceed.  I discussed the assessment and treatment plan with the patient. The patient was provided an opportunity to ask questions and all were answered. The patient agreed with the plan and demonstrated an understanding of the instructions.   The patient was advised to call back or seek an in-person evaluation if the symptoms worsen or if the condition fails to improve as anticipated.   [facility] MD OP Progress Note  06/14/2020 9:37 PM Melissa Montes  MRN:  [id number]  Chief Complaint:  Chief Complaint    Follow-up     HPI: Melissa Montes is a 64 year old Caucasian female on disability, divorced, lives in [place], has a history of bipolar disorder, anxiety disorder, coronary artery disease status post stent placement, history of MI, chronic pain, diverticulitis was evaluated by telemedicine today.  Patient today reports she is currently not taking the Sonata or the mirtazapine.  She reports the mirtazapine did help her to stay calm however it did not help her to sleep.  She reports the mirtazapine was giving her weird dreams however once she added the Sonata her dreams started getting worse and the Sonata also did not help her with the sleep.  She hence stopped taking both.  She reports her sleep is also affected by her pain as well as side effects of sweatiness from the tramadol.  She reports that trazodone did help her in the past however it stopped working.  The highest dose that she has tried with the trazodone is 100 mg.  Patient reports she is  interested in group therapy if possible.  She reports that has helped her in the past.  She used to attend group therapy at [facility].  She reports they do not accept her health insurance plan at this time.  Patient denies any suicidality, homicidality or perceptual disturbances.  She continues to take Cymbalta and denies side effects.  Patient denies any other concerns today.  Visit Diagnosis:    ICD-10-CM   1. GAD (generalized anxiety disorder)  F41.1 traZODone (DESYREL) 100 MG tablet  2. Bipolar disorder, current episode depressed, mild ([place])  F31.31   3. Insomnia, unspecified type  G47.00 traZODone (DESYREL) 100 MG tablet    Past Psychiatric History: I have reviewed past psychiatric history from my progress note on 04/26/2020  Past Medical History:  Past Medical History:  Diagnosis Date  . Anxiety   . Bipolar disorder ([place])   . Depression   . Diverticulitis   . Heart disease   . Hyperlipidemia   . MI (myocardial infarction) [facility])     Past Surgical History:  Procedure Laterality Date  . ABDOMINAL HYSTERECTOMY    . CAROTID ANGIOGRAPHY N/A 01/13/2018   Procedure: CAROTID ANGIOGRAPHY;  Surgeon: Algernon Huxley, MD;  Location: [place] [facility];  Service: Cardiovascular;  Laterality: N/A;  . CORONARY ANGIOPLASTY WITH STENT PLACEMENT    . LEFT HEART CATH Right 07/07/2018   Procedure: Left Heart Cath and Coronary Angiography;  Surgeon: Dionisio David, MD;  Location: [place] [facility];  Service: Cardiovascular;  Laterality: Right;  Family Psychiatric History: I have reviewed family psychiatric history from my progress note on 04/26/2020  Family History:  Family History  Problem Relation Age of Onset  . Breast cancer Mother 78  . Breast cancer Maternal Aunt        mat aunt and great aunt  . Depression Sister     Social History: I have reviewed social history from my progress note on 04/26/2020 Social History   Socioeconomic History  . Marital status: Divorced    Spouse name:  Not on file  . Number of children: Not on file  . Years of education: Not on file  . Highest education level: Not on file  Occupational History  . Occupation: disabled  Tobacco Use  . Smoking status: Former Smoker    Packs/day: 1.00    Years: 41.00    Pack years: 41.00    Types: Cigarettes    Quit date: 09/19/2018    Years since quitting: 1.7  . Smokeless tobacco: Never Used  . Tobacco comment: uses nicotine gums  Substance and Sexual Activity  . Alcohol use: No  . Drug use: No  . Sexual activity: Not on file  Other Topics Concern  . Not on file  Social History Narrative  . Not on file   Social Determinants of Health   Financial Resource Strain:   . Difficulty of Paying Living Expenses:   Food Insecurity:   . Worried About Charity fundraiser in the Last Year:   . Arboriculturist in the Last Year:   Transportation Needs:   . Film/video editor (Medical):   Marland Kitchen Lack of Transportation (Non-Medical):   Physical Activity:   . Days of Exercise per Week:   . Minutes of Exercise per Session:   Stress:   . Feeling of Stress :   Social Connections:   . Frequency of Communication with Friends and Family:   . Frequency of Social Gatherings with Friends and Family:   . Attends Religious Services:   . Active Member of Clubs or Organizations:   . Attends Archivist Meetings:   Marland Kitchen Marital Status:     Allergies:  Allergies  Allergen Reactions  . Penicillins Swelling  . Amoxicillin-Pot Clavulanate   . Keflex [Cephalexin] Hives    Metabolic Disorder Labs: No results found for: HGBA1C, MPG No results found for: PROLACTIN No results found for: CHOL, TRIG, HDL, CHOLHDL, VLDL, LDLCALC No results found for: TSH  Therapeutic Level Labs: No results found for: LITHIUM No results found for: VALPROATE No components found for:  CBMZ  Current Medications: Current Outpatient Medications  Medication Sig Dispense Refill  . aspirin EC 81 MG tablet Take 162 mg by mouth  every evening.     Marland Kitchen atorvastatin (LIPITOR) 80 MG tablet Take 80 mg by mouth every evening.     . celecoxib (CELEBREX) 200 MG capsule Take 200 mg by mouth daily.    . Cholecalciferol (VITAMIN D3) 1.25 MG (50000 UT) CAPS Take 1 tablet by mouth once a week.    . clopidogrel (PLAVIX) 75 MG tablet Take 75 mg by mouth daily.    . DULoxetine (CYMBALTA) 60 MG capsule Take 60 mg by mouth daily.    Marland Kitchen ezetimibe (ZETIA) 10 MG tablet Take 10 mg by mouth daily.     . hydrOXYzine (ATARAX/VISTARIL) 10 MG tablet Take 1 tablet (10 mg total) by mouth daily as needed. 30 tablet 1  . metoprolol succinate (TOPROL-XL) 25 MG 24 hr  tablet Take 50 mg by mouth daily.     . Multiple Vitamin (MULTIVITAMIN) tablet Take 1 tablet by mouth daily. Centrum Silver for Women    . nitroGLYCERIN (NITROSTAT) 0.4 MG SL tablet Place 0.4 mg under the tongue every 5 (five) minutes x 3 doses as needed for chest pain.     Marland Kitchen nystatin (MYCOSTATIN) 100000 UNIT/ML suspension Take 5 mLs by mouth 4 (four) times daily as needed.    . ondansetron (ZOFRAN) 4 MG tablet Take 1 tablet by mouth 4 (four) times daily.    . promethazine (PHENERGAN) 25 MG tablet Take 25 mg by mouth every 8 (eight) hours as needed for nausea or vomiting.    . sucralfate (CARAFATE) 1 g tablet Take 1 g by mouth 4 (four) times daily -  with meals and at bedtime.    . SYMBICORT 80-4.5 MCG/ACT inhaler SMARTSIG:1 Puff(s) By Mouth Every 12 Hours    . traMADol (ULTRAM) 50 MG tablet Take 100 mg by mouth every 12 (twelve) hours as needed.    . traMADol HCl 100 MG TABS Take 1.5 tablets by mouth 2 (two) times daily as needed. 90 tablet 1  . traZODone (DESYREL) 100 MG tablet Take 1.5-2 tablets (150-200 mg total) by mouth at bedtime. 60 tablet 1   No current facility-administered medications for this visit.     Musculoskeletal: Strength & Muscle Tone: UTA Gait & Station: normal Patient leans: N/A  Psychiatric Specialty Exam: Review of Systems  Musculoskeletal: Positive for  arthralgias.  Psychiatric/Behavioral: Positive for sleep disturbance. The patient is nervous/anxious.   All other systems reviewed and are negative.   There were no vitals taken for this visit.There is no height or weight on file to calculate BMI.  General Appearance: Casual  Eye Contact:  Fair  Speech:  Clear and Coherent  Volume:  Normal  Mood:  Anxious  Affect:  Congruent  Thought Process:  Goal Directed and Descriptions of Associations: Intact  Orientation:  Full (Time, Place, and Person)  Thought Content: Logical   Suicidal Thoughts:  No  Homicidal Thoughts:  No  Memory:  Immediate;   Fair Recent;   Fair Remote;   Fair  Judgement:  Fair  Insight:  Fair  Psychomotor Activity:  Normal  Concentration:  Concentration: Fair and Attention Span: Fair  Recall:  [employer] of Knowledge: Fair  Language: Fair  Akathisia:  No  Handed:  Right  AIMS (if indicated): UTA  Assets:  Communication Skills Desire for Improvement Housing Social Support  ADL's:  Intact  Cognition: WNL  Sleep:  Poor   Screenings: PHQ2-9     Procedure visit from 04/11/2020 in [facility] Procedure visit from 03/14/2020 in [facility] from 11/20/2018 in [facility]  PHQ-2 Total Score 0 0 4  PHQ-9 Total Score -- -- 13       Assessment and Plan: Melissa Montes is a 64 year old Caucasian female, disabled, divorced, lives in [place], has a history of bipolar disorder, anxiety disorder, MI status post stent placement, diverticulitis, chronic pain was evaluated by telemedicine today.  Patient is currently struggling with sleep issues as well as anxiety and will benefit from continued medication readjustment.  Discussed plan as noted below.  Plan GAD-some progress Continue Cymbalta 60 mg p.o. daily Hydroxyzine 10 mg p.o. daily as needed for severe anxiety attacks Will refer patient for  IOP-patient is not interested in individual psychotherapy.  Bipolar disorder current episode depressed-mild-some progress Continue Cymbalta 60 mg p.o. daily for now Start trazodone 150 to 200 mg p.o. nightly. Discussed with patient drug to drug interaction with her medications and tramadol. We will consider adding a mood stabilizer however will request medical records from previous provider at [facility]-still pending.  Follow-up in clinic in 4 weeks or sooner if needed.  I have spent atleast 20 minutes non face to face with patient today. More than 50 % of the time was spent for preparing to see the patient ( e.g., review of test, records ), obtaining and to review and separately obtained history , ordering medications and test ,psychoeducation and supportive psychotherapy and care coordination,as well as documenting clinical information in electronic health record. This note was generated in part or whole with voice recognition software. Voice recognition is usually quite accurate but there are transcription errors that can and very often do occur. I apologize for any typographical errors that were not detected and corrected.        Ursula Alert, MD 06/14/2020, 9:37 PM

## 2020-06-15 ENCOUNTER — Telehealth (HOSPITAL_COMMUNITY): Payer: Self-pay | Admitting: Psychiatry

## 2020-06-15 NOTE — Telephone Encounter (Signed)
D:  Dr. Shea Evans referred pt to [place].  A:  Placed call to orient pt.  According to pt, she doesn't get up until 10 a.m..  Reiterated to pt, that MH-IOP is 9 a.m-12 noon.  Pt is declining the group at this time.  "I have problems sleeping at night."  Provided pt with [facility]'s phone number, where she can pick and choose groups and times.  Inform Dr. Shea Evans.  R:  Pt receptive.

## 2020-06-21 ENCOUNTER — Telehealth: Payer: Self-pay

## 2020-06-21 NOTE — Telephone Encounter (Signed)
pt called left message that she still not sleeping and wanted to know is she can take trazodone 200mg 

## 2020-06-21 NOTE — Telephone Encounter (Signed)
Returned call to patient.  Discussed with her to increase her trazodone to 200 mg.  If that itself does not help to add melatonin lower dosage along with the trazodone.

## 2020-06-23 DIAGNOSIS — R42 Dizziness and giddiness: Secondary | ICD-10-CM | POA: Diagnosis not present

## 2020-06-23 DIAGNOSIS — I714 Abdominal aortic aneurysm, without rupture: Secondary | ICD-10-CM | POA: Diagnosis not present

## 2020-06-23 DIAGNOSIS — I2510 Atherosclerotic heart disease of native coronary artery without angina pectoris: Secondary | ICD-10-CM | POA: Diagnosis not present

## 2020-06-23 DIAGNOSIS — I10 Essential (primary) hypertension: Secondary | ICD-10-CM | POA: Diagnosis not present

## 2020-06-28 ENCOUNTER — Ambulatory Visit (HOSPITAL_BASED_OUTPATIENT_CLINIC_OR_DEPARTMENT_OTHER): Payer: Medicare HMO | Admitting: Student in an Organized Health Care Education/Training Program

## 2020-06-28 ENCOUNTER — Encounter (INDEPENDENT_AMBULATORY_CARE_PROVIDER_SITE_OTHER): Payer: Medicare HMO

## 2020-06-28 ENCOUNTER — Other Ambulatory Visit: Payer: Self-pay

## 2020-06-28 ENCOUNTER — Encounter: Payer: Self-pay | Admitting: Student in an Organized Health Care Education/Training Program

## 2020-06-28 ENCOUNTER — Ambulatory Visit
Admission: RE | Admit: 2020-06-28 | Discharge: 2020-06-28 | Disposition: A | Discharge status: Home / Self Care | Payer: Medicare HMO | Source: Ambulatory Visit | Attending: Student in an Organized Health Care Education/Training Program | Admitting: Student in an Organized Health Care Education/Training Program

## 2020-06-28 ENCOUNTER — Ambulatory Visit
Admission: RE | Admit: 2020-06-28 | Discharge: 2020-06-28 | Disposition: A | Discharge status: Home / Self Care | Payer: Medicare HMO | Attending: Student in an Organized Health Care Education/Training Program | Admitting: Student in an Organized Health Care Education/Training Program

## 2020-06-28 ENCOUNTER — Ambulatory Visit (INDEPENDENT_AMBULATORY_CARE_PROVIDER_SITE_OTHER): Payer: Medicare HMO | Admitting: Vascular Surgery

## 2020-06-28 VITALS — BP 73/63 | HR 75 | Temp 97.9°F | Resp 18 | Ht 62.0 in | Wt 108.0 lb

## 2020-06-28 DIAGNOSIS — M25551 Pain in right hip: Secondary | ICD-10-CM | POA: Diagnosis not present

## 2020-06-28 DIAGNOSIS — G894 Chronic pain syndrome: Secondary | ICD-10-CM | POA: Diagnosis not present

## 2020-06-28 DIAGNOSIS — G8929 Other chronic pain: Secondary | ICD-10-CM | POA: Insufficient documentation

## 2020-06-28 DIAGNOSIS — M12812 Other specific arthropathies, not elsewhere classified, left shoulder: Secondary | ICD-10-CM | POA: Insufficient documentation

## 2020-06-28 DIAGNOSIS — M25512 Pain in left shoulder: Secondary | ICD-10-CM

## 2020-06-28 DIAGNOSIS — M25511 Pain in right shoulder: Secondary | ICD-10-CM

## 2020-06-28 DIAGNOSIS — M25552 Pain in left hip: Secondary | ICD-10-CM | POA: Diagnosis not present

## 2020-06-28 DIAGNOSIS — M75102 Unspecified rotator cuff tear or rupture of left shoulder, not specified as traumatic: Secondary | ICD-10-CM | POA: Insufficient documentation

## 2020-06-28 DIAGNOSIS — M5136 Other intervertebral disc degeneration, lumbar region: Secondary | ICD-10-CM | POA: Insufficient documentation

## 2020-06-28 DIAGNOSIS — M160 Bilateral primary osteoarthritis of hip: Secondary | ICD-10-CM | POA: Insufficient documentation

## 2020-06-28 DIAGNOSIS — M12811 Other specific arthropathies, not elsewhere classified, right shoulder: Secondary | ICD-10-CM

## 2020-06-28 DIAGNOSIS — M75101 Unspecified rotator cuff tear or rupture of right shoulder, not specified as traumatic: Secondary | ICD-10-CM | POA: Insufficient documentation

## 2020-06-28 DIAGNOSIS — M47816 Spondylosis without myelopathy or radiculopathy, lumbar region: Secondary | ICD-10-CM | POA: Diagnosis not present

## 2020-06-28 DIAGNOSIS — M19011 Primary osteoarthritis, right shoulder: Secondary | ICD-10-CM | POA: Diagnosis not present

## 2020-06-28 MED ORDER — TRAMADOL HCL 100 MG PO TABS: 1.5000 | ORAL_TABLET | Freq: Two times a day (BID) | ORAL | 1 refills | Status: DC | PRN

## 2020-06-28 NOTE — Assessment & Plan Note (Signed)
Shoulder x-ray, consider intra-articular shoulder steroid injection pending results

## 2020-06-28 NOTE — Assessment & Plan Note (Signed)
Bilateral intra-articular hip steroid injection with sedation

## 2020-06-28 NOTE — Progress Notes (Signed)
PROVIDER NOTE: Information contained herein reflects review and annotations entered in association with encounter. Interpretation of such information and data should be left to medically-trained personnel. Information provided to patient can be located elsewhere in the medical record under "Patient Instructions". Document created using STT-dictation technology, any transcriptional errors that may result from process are unintentional.    Patient: Melissa Montes  Service Category: E/M  Provider: Gillis Santa, MD  DOB: [birth year]  DOS: 06/28/2020  Specialty: Interventional Pain Management  MRN: [id number]  Setting: Ambulatory outpatient  PCP: Perrin Maltese, MD  Type: Established Patient    Referring Provider: Perrin Maltese, MD  Location: Office  Delivery: Face-to-face     HPI  Reason for encounter: Melissa Montes, a 64 y.o. year old female, is here today for evaluation and management of her Bilateral hip pain [M25.551, M25.552]. Ms. Andujo's primary complain today is Back Pain, Hip Pain, and Shoulder Pain Last encounter: Practice (06/13/2020). My last encounter with her was on 64/10/2020. Pertinent problems: Melissa Montes has S/P coronary artery stent placement; Peripheral vascular disease ([place]); GAD (generalized anxiety disorder); S/P coronary artery bypass graft x 2; Osteoporosis, post-menopausal; Chronic pain syndrome; Lumbar facet arthropathy; Bilateral hip pain; Primary osteoarthritis of both hips; Chronic left shoulder pain; Right rotator cuff tear arthropathy; Left rotator cuff tear arthropathy; and Lumbar degenerative disc disease on their pertinent problem list. Pain Assessment: Severity of Chronic pain is reported as a 4 /10. Location: Back Lower/radiates upwards. Onset: More than a month ago. Quality: Aching. Timing: Constant. Modifying factor(s): tramadol. Vitals:  height is '5\' 2"'  (1.575 m) and weight is 108 lb (49 kg). Her temperature is 97.9 F (36.6 C). Her blood pressure is  73/63 (abnormal) and her pulse is 75. Her respiration is 18 and oxygen saturation is 100%.    Post-Procedure Evaluation  Procedure (05/30/2020):   Left L3, L4, L5 facet medial branch radiofrequency ablation 03/14/2020, right L3, L4, L5 facet medial branch radiofrequency ablation on 02/01/2020  Sedation: Please see nurses note.  Effectiveness during initial hour after procedure(Ultra-Short Term Relief): 100 %   Local anesthetic used: Long-acting (4-6 hours) Effectiveness: Defined as any analgesic benefit obtained secondary to the administration of local anesthetics. This carries significant diagnostic value as to the etiological location, or anatomical origin, of the pain. Duration of benefit is expected to coincide with the duration of the local anesthetic used.  Effectiveness during initial 4-6 hours after procedure(Short-Term Relief): 100 %   Long-term benefit: Defined as any relief past the pharmacologic duration of the local anesthetics.  Effectiveness past the initial 6 hours after procedure(Long-Term Relief): 20 % (lasting 2 months) .  Current benefits: Defined as benefit that persist at this time.   Analgesia:  <50% better Function: Somewhat improved ROM: Somewhat improved   Pharmacotherapy Assessment   05/25/2020  1   04/11/2020  Tramadol Hcl 100 MG Tablet  90.00  30 Bi Lat   [id number]   [facility] ([id number])   1/1  30.00 MME  Medicare   [place]    Analgesic: Tramadol 150 mg twice daily Monitoring: [place] PMP: PDMP reviewed during this encounter.       Pharmacotherapy: No side-effects or adverse reactions reported. Compliance: No problems identified. Effectiveness: Clinically acceptable.  Dewayne Shorter, RN  06/28/2020  1:48 PM  Sign when Signing Visit Nursing Pain Medication Assessment:  Safety precautions to be maintained throughout the outpatient stay will include: orient to surroundings, keep bed in low position, maintain call bell within reach  at all times, provide assistance with transfer out  of bed and ambulation.  Medication Inspection Compliance: Ms. Heroux did not comply with our request to bring her pills to be counted. She was reminded that bringing the medication bottles, even when empty, is a requirement.  Medication: None brought in. Pill/Patch Count: None available to be counted. Bottle Appearance: No container available. Did not bring bottle(s) to appointment. Filled Date: N/A Last Medication intake:  Today  Reminded to bring to appointments    UDS: No results found for: SUMMARY   ROS  Constitutional: Denies any fever or chills Gastrointestinal: No reported hemesis, hematochezia, vomiting, or acute GI distress Musculoskeletal: Low back pain, bilateral hip pain, bilateral shoulder pain, right greater than left Neurological: No reported episodes of acute onset apraxia, aphasia, dysarthria, agnosia, amnesia, paralysis, loss of coordination, or loss of consciousness  Medication Review  DULoxetine, Vitamin D3, aspirin EC, atorvastatin, budesonide-formoterol, celecoxib, clopidogrel, ezetimibe, hydrOXYzine, metoprolol succinate, multivitamin, nitroGLYCERIN, nystatin, ondansetron, promethazine, sucralfate, traMADol HCl, and traZODone  History Review  Allergy: Melissa Montes is allergic to penicillins, amoxicillin-pot clavulanate, and keflex [cephalexin]. Drug: Melissa Montes  reports no history of drug use. Alcohol:  reports no history of alcohol use. Tobacco:  reports that she quit smoking about 21 months ago. Her smoking use included cigarettes. She has a 41.00 pack-year smoking history. She has never used smokeless tobacco. Social: Melissa Montes  reports that she quit smoking about 21 months ago. Her smoking use included cigarettes. She has a 41.00 pack-year smoking history. She has never used smokeless tobacco. She reports that she does not drink alcohol and does not use drugs. Medical:  has a past medical history of Anxiety, Bipolar disorder ([place]), Depression,  Diverticulitis, Heart disease, Hyperlipidemia, and MI (myocardial infarction) ([place]). Surgical: Melissa Montes  has a past surgical history that includes Coronary angioplasty with stent; Abdominal hysterectomy; CAROTID ANGIOGRAPHY (N/A, 01/13/2018); and Left Heart Cath (Right, 07/07/2018). Family: family history includes Breast cancer in her maternal aunt; Breast cancer (age of onset: 62) in her mother; Depression in her sister.  Laboratory Chemistry Profile   Renal Lab Results  Component Value Date   BUN 23 05/03/2020   CREATININE 0.61 05/03/2020   GFRAA >60 05/03/2020   GFRNONAA >60 05/03/2020     Hepatic Lab Results  Component Value Date   AST 25 05/03/2020   ALT 17 05/03/2020   ALBUMIN 3.9 05/03/2020   ALKPHOS 101 05/03/2020     Electrolytes Lab Results  Component Value Date   NA 142 05/03/2020   K 4.2 05/03/2020   CL 105 05/03/2020   CALCIUM 9.4 05/03/2020     Bone No results found for: VD25OH, VD125OH2TOT, [id number], [id number], 25OHVITD1, 25OHVITD2, 25OHVITD3, TESTOFREE, TESTOSTERONE   Inflammation (CRP: Acute Phase) (ESR: Chronic Phase) No results found for: CRP, ESRSEDRATE, LATICACIDVEN     Note: Above Lab results reviewed.   Physical Exam  General appearance: Well nourished, well developed, and well hydrated. In no apparent acute distress Mental status: Alert, oriented x 3 (person, place, & time)       Respiratory: No evidence of acute respiratory distress Eyes: PERLA Vitals: BP (!) 73/63   Pulse 75   Temp 97.9 F (36.6 C)   Resp 18   Ht '5\' 2"'  (1.575 m)   Wt 108 lb (49 kg)   SpO2 100%   BMI 19.75 kg/m  BMI: Estimated body mass index is 19.75 kg/m as calculated from the following:   Height as of this encounter: '5\' 2"'  (  1.575 m).   Weight as of this encounter: 108 lb (49 kg). Ideal: Ideal body weight: 50.1 kg (110 lb 7.2 oz)   Cervical Spine Area Exam  Skin & Axial Inspection: No masses, redness, edema, swelling, or associated skin lesions Alignment:  Symmetrical Functional ROM: Pain restricted ROM      Stability: No instability detected Muscle Tone/Strength: Functionally intact. No obvious neuro-muscular anomalies detected. Sensory (Neurological): Musculoskeletal pain pattern  Upper Extremity (UE) Exam    Side: Right upper extremity  Side: Left upper extremity  Skin & Extremity Inspection: Skin color, temperature, and hair growth are WNL. No peripheral edema or cyanosis. No masses, redness, swelling, asymmetry, or associated skin lesions. No contractures.  Skin & Extremity Inspection: Skin color, temperature, and hair growth are WNL. No peripheral edema or cyanosis. No masses, redness, swelling, asymmetry, or associated skin lesions. No contractures.  Functional ROM: Pain restricted ROM for shoulder  Functional ROM: Pain restricted ROM for shoulder  Muscle Tone/Strength: Functionally intact. No obvious neuro-muscular anomalies detected.  Muscle Tone/Strength: Functionally intact. No obvious neuro-muscular anomalies detected.  Sensory (Neurological): Arthropathic arthralgia          Sensory (Neurological): Arthropathic arthralgia          Palpation: No palpable anomalies              Palpation: No palpable anomalies              Provocative Test(s):  Phalen's test: deferred Tinel's test: deferred Apley's scratch test (touch opposite shoulder):  Action 1 (Across chest): Decreased ROM Action 2 (Overhead): Decreased ROM Action 3 (LB reach): Decreased ROM   Provocative Test(s):  Phalen's test: deferred Tinel's test: deferred Apley's scratch test (touch opposite shoulder):  Action 1 (Across chest): Decreased ROM Action 2 (Overhead): Decreased ROM Action 3 (LB reach): Decreased ROM    Lumbar Spine Area Exam  Skin & Axial Inspection: No masses, redness, or swelling Alignment: Symmetrical Functional ROM: Pain restricted ROM affecting both sides Stability: No instability detected Muscle Tone/Strength: Functionally intact. No obvious  neuro-muscular anomalies detected. Sensory (Neurological): Musculoskeletal pain pattern Palpation: No palpable anomalies       Provocative Tests: Hyperextension/rotation test: (+) bilaterally for facet joint pain.  Improved after treatment Lumbar quadrant test (Kemp's test): (+) bilaterally for facet joint pain.  Improved after treatment  Gait & Posture Assessment  Ambulation: Unassisted Gait: Relatively normal for age and body habitus Posture: WNL  Lower Extremity Exam    Side: Right lower extremity  Side: Left lower extremity  Stability: No instability observed          Stability: No instability observed          Skin & Extremity Inspection: Skin color, temperature, and hair growth are WNL. No peripheral edema or cyanosis. No masses, redness, swelling, asymmetry, or associated skin lesions. No contractures.  Skin & Extremity Inspection: Skin color, temperature, and hair growth are WNL. No peripheral edema or cyanosis. No masses, redness, swelling, asymmetry, or associated skin lesions. No contractures.  Functional ROM: Pain restricted ROM for hip and knee joints          Functional ROM: Pain restricted ROM for hip and knee joints          Muscle Tone/Strength: Functionally intact. No obvious neuro-muscular anomalies detected.  Muscle Tone/Strength: Functionally intact. No obvious neuro-muscular anomalies detected.  Sensory (Neurological): Musculoskeletal pain pattern        Sensory (Neurological): Musculoskeletal pain pattern  DTR: Patellar: deferred today Achilles: deferred today Plantar: deferred today  DTR: Patellar: deferred today Achilles: deferred today Plantar: deferred today  Palpation: No palpable anomalies  Palpation: No palpable anomalies    Assessment   Status Diagnosis  Having a Flare-up Having a Flare-up Persistent 1. Bilateral hip pain   2. Primary osteoarthritis of both hips   3. Chronic right shoulder pain   4. Right rotator cuff tear arthropathy   5.  Chronic left shoulder pain   6. Left rotator cuff tear arthropathy   7. Lumbar degenerative disc disease   8. Chronic pain syndrome   9. Lumbar facet arthropathy      Updated Problems: Problem  Bilateral Hip Pain  Primary Osteoarthritis of Both Hips   Bilateral hip pain secondary to bilateral hip osteoarthritis.  Has had intra-articular hip steroid injections in the past.  Would like to have them repeated.   Chronic Left Shoulder Pain  Right Rotator Cuff Tear Arthropathy  Left Rotator Cuff Tear Arthropathy  Lumbar Degenerative Disc Disease  Chronic Pain Syndrome  Lumbar Facet Arthropathy  Osteoporosis, Post-Menopausal  S/P Coronary Artery Bypass Graft X 2   LIMA-LAD, SVG-OM1   Peripheral Vascular Disease ([facility])   Overview:  abdominal aortography 12/2007 - moderate proximal disease in her iliacs.  Patient has claudication when climbing hills  abdominal aortography 12/2007 - moderate proximal disease in her iliacs.  Patient has claudication when climbing hills   Gad (Generalized Anxiety Disorder)  S/P Coronary Artery Stent Placement    Plan of Care  Problem-specific:  Primary osteoarthritis of both hips Bilateral intra-articular hip steroid injection with sedation  Right rotator cuff tear arthropathy Shoulder x-ray, consider intra-articular shoulder steroid injection pending results  Left rotator cuff tear arthropathy Shoulder x-ray, consider intra-articular shoulder steroid injection pending results  Ms. Regine Christian Dezarn has a current medication list which includes the following long-term medication(s): atorvastatin, ezetimibe, metoprolol succinate, nitroglycerin, promethazine, sucralfate, symbicort, and trazodone.  Pharmacotherapy (Medications Ordered): Meds ordered this encounter  Medications  . traMADol HCl 100 MG TABS    Sig: Take 1.5 tablets by mouth 2 (two) times daily as needed.    Dispense:  90 tablet    Refill:  1    For chronic pain syndrome   Orders:   Orders Placed This Encounter  Procedures  . HIP INJECTION    Standing Status:   Future    Standing Expiration Date:   12/29/2020    Scheduling Instructions:     Side: Bilateral     Sedation: Patient's choice.     Timeframe: As soon as schedule allows  . DG Shoulder Left    Standing Status:   Future    Number of Occurrences:   1    Standing Expiration Date:   12/29/2020    Order Specific Question:   Reason for Exam (SYMPTOM  OR DIAGNOSIS REQUIRED)    Answer:   Left shoulder pain    Order Specific Question:   Preferred imaging location?    Answer:   [facility]    Order Specific Question:   Call Results- Best Contact Number?    Answer:   [phone]  . DG Shoulder Right    Standing Status:   Future    Number of Occurrences:   1    Standing Expiration Date:   12/29/2020    Order Specific Question:   Reason for Exam (SYMPTOM  OR DIAGNOSIS REQUIRED)    Answer:   right shoulder pain  Order Specific Question:   Preferred imaging location?    Answer:   [facility]    Order Specific Question:   Radiology Contrast Protocol - do NOT remove file path    Answer:   [email or web address]   Follow-up plan:   Return in about 4 weeks (around 07/26/2020) for B/L hip injection , with sedation.     Status post bilateral L3, L4, L5 facet medial branch nerve block #2 3/15/202, 90% pain relief for 10-12 hrs, proceed with RFA.  Left L3, L4, L5 RFA 03/14/2020; Right L3,4,5 RFA 04/11/20.  Repeat as needed, consider sprint peripheral nerve stimulation of lumbar medial branch       Recent Visits Date Type Provider Dept  04/11/20 Procedure visit Gillis Santa, MD [facility]  Showing recent visits within past 90 days and meeting all other requirements Today's Visits Date Type Provider Dept  06/28/20 Office Visit Gillis Santa, MD [facility]  Showing today's visits and meeting all other requirements Future Appointments Date Type Provider  Dept  09/15/20 Appointment Gillis Santa, MD [facility]  Showing future appointments within next 90 days and meeting all other requirements  I discussed the assessment and treatment plan with the patient. The patient was provided an opportunity to ask questions and all were answered. The patient agreed with the plan and demonstrated an understanding of the instructions.  Patient advised to call back or seek an in-person evaluation if the symptoms or condition worsens.  Duration of encounter: 35 minutes.  Note by: Gillis Santa, MD Date: 06/28/2020; Time: 4:01 PM

## 2020-06-28 NOTE — Patient Instructions (Addendum)
Consider Spint PNS for low back pain You will have xrays completed prior to next appt.    Moderate Conscious Sedation, Adult Sedation is the use of medicines to promote relaxation and relieve discomfort and anxiety. Moderate conscious sedation is a type of sedation. Under moderate conscious sedation, you are less alert than normal, but you are still able to respond to instructions, touch, or both. Moderate conscious sedation is used during short medical and dental procedures. It is milder than deep sedation, which is a type of sedation under which you cannot be easily woken up. It is also milder than general anesthesia, which is the use of medicines to make you unconscious. Moderate conscious sedation allows you to return to your regular activities sooner. Tell a health care provider about:  Any allergies you have.  All medicines you are taking, including vitamins, herbs, eye drops, creams, and over-the-counter medicines.  Use of steroids (by mouth or creams).  Any problems you or family members have had with sedatives and anesthetic medicines.  Any blood disorders you have.  Any surgeries you have had.  Any medical conditions you have, such as sleep apnea.  Whether you are pregnant or may be pregnant.  Any use of cigarettes, alcohol, marijuana, or street drugs. What are the risks? Generally, this is a safe procedure. However, problems may occur, including:  Getting too much medicine (oversedation).  Nausea.  Allergic reaction to medicines.  Trouble breathing. If this happens, a breathing tube may be used to help with breathing. It will be removed when you are awake and breathing on your own.  Heart trouble.  Lung trouble. What happens before the procedure? Staying hydrated Follow instructions from your health care provider about hydration, which may include:  Up to 2 hours before the procedure - you may continue to drink clear liquids, such as water, clear fruit juice,  black coffee, and plain tea. Eating and drinking restrictions Follow instructions from your health care provider about eating and drinking, which may include:  8 hours before the procedure - stop eating heavy meals or foods such as meat, fried foods, or fatty foods.  6 hours before the procedure - stop eating light meals or foods, such as toast or cereal.  6 hours before the procedure - stop drinking milk or drinks that contain milk.  2 hours before the procedure - stop drinking clear liquids. Medicine Ask your health care provider about:  Changing or stopping your regular medicines. This is especially important if you are taking diabetes medicines or blood thinners.  Taking medicines such as aspirin and ibuprofen. These medicines can thin your blood. Do not take these medicines before your procedure if your health care provider instructs you not to.  Tests and exams  You will have a physical exam.  You may have blood tests done to show: ? How well your kidneys and liver are working. ? How well your blood can clot. General instructions  Plan to have someone take you home from the hospital or clinic.  If you will be going home right after the procedure, plan to have someone with you for 24 hours. What happens during the procedure?  An IV tube will be inserted into one of your veins.  Medicine to help you relax (sedative) will be given through the IV tube.  The medical or dental procedure will be performed. What happens after the procedure?  Your blood pressure, heart rate, breathing rate, and blood oxygen level will be monitored often until  the medicines you were given have worn off.  Do not drive for 24 hours. This information is not intended to replace advice given to you by your health care provider. Make sure you discuss any questions you have with your health care provider. Document Revised: 10/18/2017 Document Reviewed: 02/25/2016 Elsevier Patient Education  [place]  What are the risk, side effects and possible complications? Generally speaking, most procedures are safe.  However, with any procedure there are risks, side effects, and the possibility of complications.  The risks and complications are dependent upon the sites that are lesioned, or the type of nerve block to be performed.  The closer the procedure is to the spine, the more serious the risks are.  Great care is taken when placing the radio frequency needles, block needles or lesioning probes, but sometimes complications can occur. 1. Infection: Any time there is an injection through the skin, there is a risk of infection.  This is why sterile conditions are used for these blocks.  There are four possible types of infection. 1. Localized skin infection. 2. Central Nervous System Infection-This can be in the form of Meningitis, which can be deadly. 3. Epidural Infections-This can be in the form of an epidural abscess, which can cause pressure inside of the spine, causing compression of the spinal cord with subsequent paralysis. This would require an emergency surgery to decompress, and there are no guarantees that the patient would recover from the paralysis. 4. Discitis-This is an infection of the intervertebral discs.  It occurs in about 1% of discography procedures.  It is difficult to treat and it may lead to surgery.        2. Pain: the needles have to go through skin and soft tissues, will cause soreness.       3. Damage to internal structures:  The nerves to be lesioned may be near blood vessels or    other nerves which can be potentially damaged.       4. Bleeding: Bleeding is more common if the patient is taking blood thinners such as  aspirin, Coumadin, Ticiid, Plavix, etc., or if he/she have some genetic predisposition  such as hemophilia. Bleeding into the spinal canal can cause compression of the spinal  cord with subsequent paralysis.  This  would require an emergency surgery to  decompress and there are no guarantees that the patient would recover from the  paralysis.       5. Pneumothorax:  Puncturing of a lung is a possibility, every time a needle is introduced in  the area of the chest or upper back.  Pneumothorax refers to free air around the  collapsed lung(s), inside of the thoracic cavity (chest cavity).  Another two possible  complications related to a similar event would include: Hemothorax and Chylothorax.   These are variations of the Pneumothorax, where instead of air around the collapsed  lung(s), you may have blood or chyle, respectively.       6. Spinal headaches: They may occur with any procedures in the area of the spine.       7. Persistent CSF (Cerebro-Spinal Fluid) leakage: This is a rare problem, but may occur  with prolonged intrathecal or epidural catheters either due to the formation of a fistulous  track or a dural tear.       8. Nerve damage: By working so close to the spinal cord, there is always a possibility of  nerve damage, which  could be as serious as a permanent spinal cord injury with  paralysis.       9. Death:  Although rare, severe deadly allergic reactions known as "Anaphylactic  reaction" can occur to any of the medications used.      10. Worsening of the symptoms:  We can always make thing worse.  What are the chances of something like this happening? Chances of any of this occuring are extremely low.  By statistics, you have more of a chance of getting killed in a motor vehicle accident: while driving to the hospital than any of the above occurring .  Nevertheless, you should be aware that they are possibilities.  In general, it is similar to taking a shower.  Everybody knows that you can slip, hit your head and get killed.  Does that mean that you should not shower again?  Nevertheless always keep in mind that statistics do not mean anything if you happen to be on the wrong side of them.  Even if a  procedure has a 1 (one) in a 1,000,000 (million) chance of going wrong, it you happen to be that one..Also, keep in mind that by statistics, you have more of a chance of having something go wrong when taking medications.  Who should not have this procedure? If you are on a blood thinning medication (e.g. Coumadin, Plavix, see list of "Blood Thinners"), or if you have an active infection going on, you should not have the procedure.  If you are taking any blood thinners, please inform your physician.  How should I prepare for this procedure?  Do not eat or drink anything at least six hours prior to the procedure.  Bring a driver with you .  It cannot be a taxi.  Come accompanied by an adult that can drive you back, and that is strong enough to help you if your legs get weak or numb from the local anesthetic.  Take all of your medicines the morning of the procedure with just enough water to swallow them.  If you have diabetes, make sure that you are scheduled to have your procedure done first thing in the morning, whenever possible.  If you have diabetes, take only half of your insulin dose and notify our nurse that you have done so as soon as you arrive at the clinic.  If you are diabetic, but only take blood sugar pills (oral hypoglycemic), then do not take them on the morning of your procedure.  You may take them after you have had the procedure.  Do not take aspirin or any aspirin-containing medications, at least eleven (11) days prior to the procedure.  They may prolong bleeding.  Wear loose fitting clothing that may be easy to take off and that you would not mind if it got stained with Betadine or blood.  Do not wear any jewelry or perfume  Remove any nail coloring.  It will interfere with some of our monitoring equipment.  NOTE: Remember that this is not meant to be interpreted as a complete list of all possible complications.  Unforeseen problems may occur.  BLOOD THINNERS The  following drugs contain aspirin or other products, which can cause increased bleeding during surgery and should not be taken for 2 weeks prior to and 1 week after surgery.  If you should need take something for relief of minor pain, you may take acetaminophen which is found in Tylenol,m Datril, Anacin-3 and Panadol. It is not blood thinner. The products  listed below are.  Do not take any of the products listed below in addition to any listed on your instruction sheet.  A.P.C or A.P.C with Codeine Codeine Phosphate Capsules #3 Ibuprofen Ridaura  ABC compound Congesprin Imuran rimadil  Advil Cope Indocin Robaxisal  Alka-Seltzer Effervescent Pain Reliever and Antacid Coricidin or Coricidin-D  Indomethacin Rufen  Alka-Seltzer plus Cold Medicine Cosprin Ketoprofen S-A-C Tablets  Anacin Analgesic Tablets or Capsules Coumadin Korlgesic Salflex  Anacin Extra Strength Analgesic tablets or capsules CP-2 Tablets Lanoril Salicylate  Anaprox Cuprimine Capsules Levenox Salocol  Anexsia-D Dalteparin Magan Salsalate  Anodynos Darvon compound Magnesium Salicylate Sine-off  Ansaid Dasin Capsules Magsal Sodium Salicylate  Anturane Depen Capsules Marnal Soma  APF Arthritis pain formula Dewitt's Pills Measurin Stanback  Argesic Dia-Gesic Meclofenamic Sulfinpyrazone  Arthritis Bayer Timed Release Aspirin Diclofenac Meclomen Sulindac  Arthritis pain formula Anacin Dicumarol Medipren Supac  Analgesic (Safety coated) Arthralgen Diffunasal Mefanamic Suprofen  Arthritis Strength Bufferin Dihydrocodeine Mepro Compound Suprol  Arthropan liquid Dopirydamole Methcarbomol with Aspirin Synalgos  ASA tablets/Enseals Disalcid Micrainin Tagament  Ascriptin Doan's Midol Talwin  Ascriptin A/D Dolene Mobidin Tanderil  Ascriptin Extra Strength Dolobid Moblgesic Ticlid  Ascriptin with Codeine Doloprin or Doloprin with Codeine Momentum Tolectin  Asperbuf Duoprin Mono-gesic Trendar  Aspergum Duradyne Motrin or Motrin IB Triminicin   Aspirin plain, buffered or enteric coated Durasal Myochrisine Trigesic  Aspirin Suppositories Easprin Nalfon Trillsate  Aspirin with Codeine Ecotrin Regular or Extra Strength Naprosyn Uracel  Atromid-S Efficin Naproxen Ursinus  Auranofin Capsules Elmiron Neocylate Vanquish  Axotal Emagrin Norgesic Verin  Azathioprine Empirin or Empirin with Codeine Normiflo Vitamin E  Azolid Emprazil Nuprin Voltaren  Bayer Aspirin plain, buffered or children's or timed BC Tablets or powders Encaprin Orgaran Warfarin Sodium  Buff-a-Comp Enoxaparin Orudis Zorpin  Buff-a-Comp with Codeine Equegesic Os-Cal-Gesic   Buffaprin Excedrin plain, buffered or Extra Strength Oxalid   Bufferin Arthritis Strength Feldene Oxphenbutazone   Bufferin plain or Extra Strength Feldene Capsules Oxycodone with Aspirin   Bufferin with Codeine Fenoprofen Fenoprofen Pabalate or Pabalate-SF   Buffets II Flogesic Panagesic   Buffinol plain or Extra Strength Florinal or Florinal with Codeine Panwarfarin   Buf-Tabs Flurbiprofen Penicillamine   Butalbital Compound Four-way cold tablets Penicillin   Butazolidin Fragmin Pepto-Bismol   Carbenicillin Geminisyn Percodan   Carna Arthritis Reliever Geopen Persantine   Carprofen Gold's salt Persistin   Chloramphenicol Goody's Phenylbutazone   Chloromycetin Haltrain Piroxlcam   Clmetidine heparin Plaquenil   Cllnoril Hyco-pap Ponstel   Clofibrate Hydroxy chloroquine Propoxyphen         Before stopping any of these medications, be sure to consult the physician who ordered them.  Some, such as Coumadin (Warfarin) are ordered to prevent or treat serious conditions such as "deep thrombosis", "pumonary embolisms", and other heart problems.  The amount of time that you may need off of the medication may also vary with the medication and the reason for which you were taking it.  If you are taking any of these medications, please make sure you notify your pain physician before you undergo any  procedures.

## 2020-06-28 NOTE — Progress Notes (Signed)
Nursing Pain Medication Assessment:  Safety precautions to be maintained throughout the outpatient stay will include: orient to surroundings, keep bed in low position, maintain call bell within reach at all times, provide assistance with transfer out of bed and ambulation.  Medication Inspection Compliance: [name] did not comply with our request to bring her pills to be counted. She was reminded that bringing the medication bottles, even when empty, is a requirement.  Medication: None brought in. Pill/Patch Count: None available to be counted. Bottle Appearance: No container available. Did not bring bottle(s) to appointment. Filled Date: N/A Last Medication intake:  Today  Reminded to bring to appointments

## 2020-06-29 ENCOUNTER — Telehealth: Payer: Self-pay | Admitting: *Deleted

## 2020-06-29 NOTE — Telephone Encounter (Signed)
Attempted to call patient to discuss x-ray results. Message left.

## 2020-06-29 NOTE — Telephone Encounter (Signed)
Patient informed of x-ray results as ordered by Dr. Holley Raring. She will discuss MRI at her next appt.

## 2020-07-01 ENCOUNTER — Encounter (INDEPENDENT_AMBULATORY_CARE_PROVIDER_SITE_OTHER): Payer: Medicare HMO

## 2020-07-01 ENCOUNTER — Other Ambulatory Visit (INDEPENDENT_AMBULATORY_CARE_PROVIDER_SITE_OTHER): Payer: Medicare HMO

## 2020-07-01 ENCOUNTER — Ambulatory Visit (INDEPENDENT_AMBULATORY_CARE_PROVIDER_SITE_OTHER): Payer: Medicare HMO | Admitting: Vascular Surgery

## 2020-07-11 DIAGNOSIS — I2510 Atherosclerotic heart disease of native coronary artery without angina pectoris: Secondary | ICD-10-CM | POA: Diagnosis not present

## 2020-07-11 DIAGNOSIS — I10 Essential (primary) hypertension: Secondary | ICD-10-CM | POA: Diagnosis not present

## 2020-07-11 DIAGNOSIS — R42 Dizziness and giddiness: Secondary | ICD-10-CM | POA: Diagnosis not present

## 2020-07-13 DIAGNOSIS — N63 Unspecified lump in unspecified breast: Secondary | ICD-10-CM | POA: Diagnosis not present

## 2020-07-13 DIAGNOSIS — I10 Essential (primary) hypertension: Secondary | ICD-10-CM | POA: Diagnosis not present

## 2020-07-17 ENCOUNTER — Emergency Department: Payer: Medicare HMO

## 2020-07-17 ENCOUNTER — Other Ambulatory Visit: Payer: Self-pay

## 2020-07-17 ENCOUNTER — Emergency Department
Admission: EM | Admit: 2020-07-17 | Discharge: 2020-07-17 | Disposition: A | Discharge status: Home / Self Care | Payer: Medicare HMO | Attending: Emergency Medicine | Admitting: Emergency Medicine

## 2020-07-17 ENCOUNTER — Encounter: Payer: Self-pay | Admitting: Emergency Medicine

## 2020-07-17 DIAGNOSIS — R1111 Vomiting without nausea: Secondary | ICD-10-CM | POA: Diagnosis not present

## 2020-07-17 DIAGNOSIS — Z5321 Procedure and treatment not carried out due to patient leaving prior to being seen by health care provider: Secondary | ICD-10-CM | POA: Diagnosis not present

## 2020-07-17 DIAGNOSIS — R0602 Shortness of breath: Secondary | ICD-10-CM | POA: Insufficient documentation

## 2020-07-17 DIAGNOSIS — M5489 Other dorsalgia: Secondary | ICD-10-CM | POA: Diagnosis not present

## 2020-07-17 DIAGNOSIS — M549 Dorsalgia, unspecified: Secondary | ICD-10-CM | POA: Insufficient documentation

## 2020-07-17 DIAGNOSIS — G8929 Other chronic pain: Secondary | ICD-10-CM | POA: Diagnosis not present

## 2020-07-17 DIAGNOSIS — R911 Solitary pulmonary nodule: Secondary | ICD-10-CM | POA: Diagnosis not present

## 2020-07-17 LAB — CBC WITH DIFFERENTIAL/PLATELET
Abs Immature Granulocytes: 0.03 10*3/uL (ref 0.00–0.07)
Basophils Absolute: 0.1 10*3/uL (ref 0.0–0.1)
Basophils Relative: 1 %
Eosinophils Absolute: 0.2 10*3/uL (ref 0.0–0.5)
Eosinophils Relative: 2 %
HCT: 40.5 % (ref 36.0–46.0)
Hemoglobin: 13.9 g/dL (ref 12.0–15.0)
Immature Granulocytes: 0 %
Lymphocytes Relative: 14 %
Lymphs Abs: 1.3 10*3/uL (ref 0.7–4.0)
MCH: 30.5 pg (ref 26.0–34.0)
MCHC: 34.3 g/dL (ref 30.0–36.0)
MCV: 88.8 fL (ref 80.0–100.0)
Monocytes Absolute: 0.5 10*3/uL (ref 0.1–1.0)
Monocytes Relative: 6 %
Neutro Abs: 7.2 10*3/uL (ref 1.7–7.7)
Neutrophils Relative %: 77 %
Platelets: 269 10*3/uL (ref 150–400)
RBC: 4.56 MIL/uL (ref 3.87–5.11)
RDW: 12.8 % (ref 11.5–15.5)
WBC: 9.3 10*3/uL (ref 4.0–10.5)
nRBC: 0 % (ref 0.0–0.2)

## 2020-07-17 LAB — COMPREHENSIVE METABOLIC PANEL
ALT: 16 U/L (ref 0–44)
AST: 22 U/L (ref 15–41)
Albumin: 4.1 g/dL (ref 3.5–5.0)
Alkaline Phosphatase: 107 U/L (ref 38–126)
Anion gap: 10 (ref 5–15)
BUN: 20 mg/dL (ref 8–23)
CO2: 23 mmol/L (ref 22–32)
Calcium: 9.4 mg/dL (ref 8.9–10.3)
Chloride: 105 mmol/L (ref 98–111)
Creatinine, Ser: 0.63 mg/dL (ref 0.44–1.00)
GFR calc Af Amer: >60 mL/min (ref >60)
GFR calc non Af Amer: >60 mL/min (ref >60)
Glucose, Bld: 114 mg/dL — ABNORMAL HIGH (ref 70–99)
Potassium: 4.4 mmol/L (ref 3.5–5.1)
Sodium: 138 mmol/L (ref 135–145)
Total Bilirubin: 1 mg/dL (ref 0.3–1.2)
Total Protein: 7 g/dL (ref 6.5–8.1)

## 2020-07-17 NOTE — ED Triage Notes (Signed)
Pt presents to ED via [facility] with c/o breathing difficulties, lower back pain with hx of chronic back pain, is currently waiting on doctor to call with results of lump found under L breast. Pt states difficulty increased breathing when laying down, does not normally wear oxygen at home. EMS reports vomiting x 4 days, has not been able to keep food down.  79 100% 1L 120/71

## 2020-07-19 ENCOUNTER — Telehealth (INDEPENDENT_AMBULATORY_CARE_PROVIDER_SITE_OTHER): Payer: Medicare HMO | Admitting: Psychiatry

## 2020-07-19 ENCOUNTER — Encounter: Payer: Self-pay | Admitting: Psychiatry

## 2020-07-19 ENCOUNTER — Other Ambulatory Visit: Payer: Self-pay

## 2020-07-19 DIAGNOSIS — G4701 Insomnia due to medical condition: Secondary | ICD-10-CM | POA: Diagnosis not present

## 2020-07-19 DIAGNOSIS — I6523 Occlusion and stenosis of bilateral carotid arteries: Secondary | ICD-10-CM

## 2020-07-19 DIAGNOSIS — F3176 Bipolar disorder, in full remission, most recent episode depressed: Secondary | ICD-10-CM | POA: Diagnosis not present

## 2020-07-19 DIAGNOSIS — R69 Illness, unspecified: Secondary | ICD-10-CM | POA: Diagnosis not present

## 2020-07-19 DIAGNOSIS — F411 Generalized anxiety disorder: Secondary | ICD-10-CM | POA: Diagnosis not present

## 2020-07-19 MED ORDER — HYDROXYZINE HCL 10 MG PO TABS: 10.0000 mg | ORAL_TABLET | ORAL | 1 refills | Status: DC

## 2020-07-19 MED ORDER — VENLAFAXINE HCL ER 37.5 MG PO CP24: 37.5000 mg | ORAL_CAPSULE | Freq: Every day | ORAL | 1 refills | Status: DC

## 2020-07-19 NOTE — Progress Notes (Signed)
Provider Location : [facility] Patient Location : Home  Participants: Patient , Provider  Virtual Visit via Video Note  I connected with Melissa Montes on 07/19/20 at  4:20 PM EDT by a video enabled telemedicine application and verified that I am speaking with the correct person using two identifiers.   I discussed the limitations of evaluation and management by telemedicine and the availability of in person appointments. The patient expressed understanding and agreed to proceed.     I discussed the assessment and treatment plan with the patient. The patient was provided an opportunity to ask questions and all were answered. The patient agreed with the plan and demonstrated an understanding of the instructions.   The patient was advised to call back or seek an in-person evaluation if the symptoms worsen or if the condition fails to improve as anticipated.   Melissa Montes Progress Note  07/19/2020 5:09 PM Sameria Morss  MRN:  [id number]  Chief Complaint:  Chief Complaint    Follow-up     HPI: Melissa Montes Stage is a 64 year old Caucasian female on disability, divorced, lives in [place], has a history of bipolar disorder, anxiety disorder, coronary artery disease status post stent placement, history of MI, chronic pain, diverticulitis was evaluated by telemedicine today.  Patient today reports she is currently struggling with anxiety.  She stopped her Cymbalta, weaned herself off of it since it did not work for her.  She reports she is currently dealing with a lot of stressors.  She was recently told she may have a breast lump and has to go in for further testing.  Patient also reports she currently struggles with emphysema and that does make her anxious.  She is unable to lay down and sleep because of her emphysema.  She has to sit upright and that is the only way she can sleep at night. Hence sleep is restless.  The trazodone at this dosage is not beneficial.  She also tried combining  melatonin low dosage with the trazodone and still could not sleep.  Patient denies any suicidality, homicidality or perceptual disturbances.  Patient currently denies any significant depression or manic symptoms.  Patient reports she is not interested in individual psychotherapy sessions at this time.  She did reach out to IOP to which she was referred to last visit.  She reports she cannot wake up early in the morning and attend the program for 3 hours straight.  She hence does not want to do it right now.    Visit Diagnosis:    ICD-10-CM   1. GAD (generalized anxiety disorder)  F41.1 hydrOXYzine (ATARAX/VISTARIL) 10 MG tablet    venlafaxine XR (EFFEXOR-XR) 37.5 MG 24 hr capsule  2. Bipolar disorder, in full remission, most recent episode depressed ([place])  F31.76   3. Insomnia due to medical condition  G47.01     Past Psychiatric History: I have reviewed past psychiatric history from my progress note on 04/26/2020.  Past Medical History:  Past Medical History:  Diagnosis Date  . Anxiety   . Bipolar disorder ([name])   . Depression   . Diverticulitis   . Heart disease   . Hyperlipidemia   . MI (myocardial infarction) [facility])     Past Surgical History:  Procedure Laterality Date  . ABDOMINAL HYSTERECTOMY    . CAROTID ANGIOGRAPHY N/A 01/13/2018   Procedure: CAROTID ANGIOGRAPHY;  Surgeon: Algernon Huxley, MD;  Location: [facility];  Service: Cardiovascular;  Laterality: N/A;  . CORONARY  ANGIOPLASTY WITH STENT PLACEMENT    . LEFT HEART CATH Right 07/07/2018   Procedure: Left Heart Cath and Coronary Angiography;  Surgeon: Dionisio David, MD;  Location: [facility];  Service: Cardiovascular;  Laterality: Right;    Family Psychiatric History: I have reviewed family psychiatric history from my progress note on 04/26/2020.  Family History:  Family History  Problem Relation Age of Onset  . Breast cancer Mother 73  . Breast cancer Maternal Aunt        mat aunt and great aunt   . Depression Sister     Social History: I have reviewed social history from my progress note on 04/26/2020. Social History   Socioeconomic History  . Marital status: Divorced    Spouse name: Not on file  . Number of children: Not on file  . Years of education: Not on file  . Highest education level: Not on file  Occupational History  . Occupation: disabled  Tobacco Use  . Smoking status: Former Smoker    Packs/day: 1.00    Years: 41.00    Pack years: 41.00    Types: Cigarettes    Quit date: 09/19/2018    Years since quitting: 1.8  . Smokeless tobacco: Never Used  . Tobacco comment: uses nicotine gums  Substance and Sexual Activity  . Alcohol use: No  . Drug use: No  . Sexual activity: Not on file  Other Topics Concern  . Not on file  Social History Narrative  . Not on file   Social Determinants of Health   Financial Resource Strain:   . Difficulty of Paying Living Expenses: Not on file  Food Insecurity:   . Worried About Charity fundraiser in the Last Year: Not on file  . Ran Out of Food in the Last Year: Not on file  Transportation Needs:   . Lack of Transportation (Medical): Not on file  . Lack of Transportation (Non-Medical): Not on file  Physical Activity:   . Days of Exercise per Week: Not on file  . Minutes of Exercise per Session: Not on file  Stress:   . Feeling of Stress : Not on file  Social Connections:   . Frequency of Communication with Friends and Family: Not on file  . Frequency of Social Gatherings with Friends and Family: Not on file  . Attends Religious Services: Not on file  . Active Member of Clubs or Organizations: Not on file  . Attends Archivist Meetings: Not on file  . Marital Status: Not on file    Allergies:  Allergies  Allergen Reactions  . Penicillins Swelling  . Amoxicillin-Pot Clavulanate   . Keflex [Cephalexin] Hives    Metabolic Disorder Labs: No results found for: HGBA1C, MPG No results found for:  PROLACTIN No results found for: CHOL, TRIG, HDL, CHOLHDL, VLDL, LDLCALC No results found for: TSH  Therapeutic Level Labs: No results found for: LITHIUM No results found for: VALPROATE No components found for:  CBMZ  Current Medications: Current Outpatient Medications  Medication Sig Dispense Refill  . aspirin EC 81 MG tablet Take 162 mg by mouth every evening.     Marland Kitchen atorvastatin (LIPITOR) 80 MG tablet Take 80 mg by mouth every evening.     . Cholecalciferol (VITAMIN D3) 1.25 MG (50000 UT) CAPS Take 1 tablet by mouth once a week.    . ezetimibe (ZETIA) 10 MG tablet Take 10 mg by mouth daily.     . hydrOXYzine (ATARAX/VISTARIL)  10 MG tablet Take 1-3 tablets (10-30 mg total) by mouth as directed. Start taking 1 tablet daily as needed for anxiety and 2-3 tablets at bedtime as needed for sleep 90 tablet 1  . metoprolol succinate (TOPROL-XL) 25 MG 24 hr tablet Take 25 mg by mouth daily.     . Multiple Vitamin (MULTIVITAMIN) tablet Take 1 tablet by mouth daily. Centrum Silver for Women    . nitroGLYCERIN (NITROSTAT) 0.4 MG SL tablet Place 0.4 mg under the tongue every 5 (five) minutes x 3 doses as needed for chest pain.     Marland Kitchen ondansetron (ZOFRAN) 4 MG tablet Take 1 tablet by mouth 4 (four) times daily.    . promethazine (PHENERGAN) 25 MG tablet Take 25 mg by mouth every 8 (eight) hours as needed for nausea or vomiting.    . SYMBICORT 80-4.5 MCG/ACT inhaler SMARTSIG:1 Puff(s) By Mouth Every 12 Hours    . traMADol HCl 100 MG TABS Take 1.5 tablets by mouth 2 (two) times daily as needed. 90 tablet 1  . traZODone (DESYREL) 100 MG tablet Take 1.5-2 tablets (150-200 mg total) by mouth at bedtime. (Patient taking differently: Take 200 mg by mouth at bedtime. ) 60 tablet 1  . venlafaxine XR (EFFEXOR-XR) 37.5 MG 24 hr capsule Take 1 capsule (37.5 mg total) by mouth daily with breakfast. 30 capsule 1   No current facility-administered medications for this visit.     Musculoskeletal: Strength & Muscle  Tone: UTA Gait & Station: normal Patient leans: N/A  Psychiatric Specialty Exam: Review of Systems  Respiratory: Positive for shortness of breath.   Psychiatric/Behavioral: Positive for sleep disturbance. The patient is nervous/anxious.   All other systems reviewed and are negative.   There were no vitals taken for this visit.There is no height or weight on file to calculate BMI.  General Appearance: Casual  Eye Contact:  Fair  Speech:  Clear and Coherent  Volume:  Normal  Mood:  Anxious  Affect:  Congruent  Thought Process:  Goal Directed and Descriptions of Associations: Intact  Orientation:  Full (Time, Place, and Person)  Thought Content: Logical   Suicidal Thoughts:  No  Homicidal Thoughts:  No  Memory:  Immediate;   Fair Recent;   Fair Remote;   Fair  Judgement:  Fair  Insight:  Fair  Psychomotor Activity:  Normal  Concentration:  Concentration: Fair and Attention Span: Fair  Recall:  [employer] of Knowledge: Fair  Language: Fair  Akathisia:  No  Handed:  Right  AIMS (if indicated): UTA  Assets:  Communication Skills Desire for Improvement Housing Social Support  ADL's:  Intact  Cognition: WNL  Sleep:  Poor   Screenings: PHQ2-9     Office Visit from 06/28/2020 in [place] Procedure visit from 04/11/2020 in [facility] Procedure visit from 03/14/2020 in [facility] from 11/20/2018 in [facility]  PHQ-2 Total Score 0 0 0 4  PHQ-9 Total Score -- -- -- 13       Assessment and Plan: Melissa Montes is a 64 year old Caucasian female, disabled, divorced, lives in [place], has a history of bipolar disorder, anxiety disorder, MI status post stent placement, diverticulitis, chronic pain, emphysema was evaluated by telemedicine today.  Patient is currently struggling with sleep issues as well as anxiety  symptoms more so because of her medical problems like recent breast lump diagnosis as  well as emphysema which has exacerbated.  Discussed plan as noted below.  Plan GAD-unstable Discontinue Cymbalta for noncompliance. Start venlafaxine extended release 37.5 mg p.o. daily with breakfast Continue hydroxyzine 10 mg p.o. daily as needed for anxiety attacks. Patient offered psychotherapy session referral however she is not interested.  Bipolar disorder in remission We will monitor closely.  Insomnia-unstable Continue trazodone 200 mg p.o. nightly Add hydroxyzine 20 to 30 mg p.o. nightly as needed for sleep Also discussed adding melatonin 6 to 9 mg p.o. nightly as needed if the trazodone by itself does not work.  Follow-up in clinic in 4 to 5 weeks or sooner if needed.  I have spent atleast 20 minutes face to face with patient today. More than 50 % of the time was spent for preparing to see the patient ( e.g., review of test, records ),ordering medications and test ,psychoeducation and supportive psychotherapy and care coordination,as well as documenting clinical information in electronic health record. This note was generated in part or whole with voice recognition software. Voice recognition is usually quite accurate but there are transcription errors that can and very often do occur. I apologize for any typographical errors that were not detected and corrected.      Ursula Alert, MD 07/20/2020, 8:04 AM

## 2020-07-20 ENCOUNTER — Encounter (INDEPENDENT_AMBULATORY_CARE_PROVIDER_SITE_OTHER): Payer: Medicare HMO

## 2020-07-20 ENCOUNTER — Ambulatory Visit (INDEPENDENT_AMBULATORY_CARE_PROVIDER_SITE_OTHER): Payer: Medicare HMO | Admitting: Nurse Practitioner

## 2020-07-20 ENCOUNTER — Other Ambulatory Visit (INDEPENDENT_AMBULATORY_CARE_PROVIDER_SITE_OTHER): Payer: Medicare HMO

## 2020-07-20 ENCOUNTER — Other Ambulatory Visit: Payer: Self-pay | Admitting: Family

## 2020-07-20 DIAGNOSIS — N644 Mastodynia: Secondary | ICD-10-CM

## 2020-07-20 DIAGNOSIS — Z1231 Encounter for screening mammogram for malignant neoplasm of breast: Secondary | ICD-10-CM

## 2020-07-21 DIAGNOSIS — I2510 Atherosclerotic heart disease of native coronary artery without angina pectoris: Secondary | ICD-10-CM | POA: Diagnosis not present

## 2020-07-21 DIAGNOSIS — R0602 Shortness of breath: Secondary | ICD-10-CM | POA: Diagnosis not present

## 2020-07-21 DIAGNOSIS — R69 Illness, unspecified: Secondary | ICD-10-CM | POA: Diagnosis not present

## 2020-07-21 DIAGNOSIS — I10 Essential (primary) hypertension: Secondary | ICD-10-CM | POA: Diagnosis not present

## 2020-07-27 ENCOUNTER — Ambulatory Visit
Admission: RE | Admit: 2020-07-27 | Discharge: 2020-07-27 | Disposition: A | Discharge status: Home / Self Care | Payer: Medicare HMO | Source: Ambulatory Visit | Attending: Family | Admitting: Family

## 2020-07-27 DIAGNOSIS — Z1231 Encounter for screening mammogram for malignant neoplasm of breast: Secondary | ICD-10-CM

## 2020-07-27 DIAGNOSIS — N644 Mastodynia: Secondary | ICD-10-CM | POA: Insufficient documentation

## 2020-07-27 DIAGNOSIS — R928 Other abnormal and inconclusive findings on diagnostic imaging of breast: Secondary | ICD-10-CM | POA: Diagnosis not present

## 2020-08-01 DIAGNOSIS — E559 Vitamin D deficiency, unspecified: Secondary | ICD-10-CM | POA: Diagnosis not present

## 2020-08-01 DIAGNOSIS — E782 Mixed hyperlipidemia: Secondary | ICD-10-CM | POA: Diagnosis not present

## 2020-08-01 DIAGNOSIS — J449 Chronic obstructive pulmonary disease, unspecified: Secondary | ICD-10-CM | POA: Diagnosis not present

## 2020-08-01 DIAGNOSIS — I2510 Atherosclerotic heart disease of native coronary artery without angina pectoris: Secondary | ICD-10-CM | POA: Diagnosis not present

## 2020-08-01 DIAGNOSIS — I10 Essential (primary) hypertension: Secondary | ICD-10-CM | POA: Diagnosis not present

## 2020-08-22 ENCOUNTER — Telehealth: Payer: Self-pay | Admitting: *Deleted

## 2020-08-22 NOTE — Telephone Encounter (Signed)
Patient states that the 150mg  Tramadol BID is just not helping with her pain as much as is was in the beginning and wants to know if she can take 200mg  BID. She has had no change in her condition. She has an in person visit on 09/15/2020. If you do let her increase her dose she will need and earlier appointment because she will run out of medications. She also wants to know if this can be changed to a VV. Please advise.

## 2020-08-22 NOTE — Telephone Encounter (Signed)
Patient called and informed. Please make her a face to face appointment for THIS week. Even if she has to be added on. She will be out of medication on Thursday 10/07.

## 2020-08-26 ENCOUNTER — Telehealth: Payer: Medicare HMO | Admitting: Nurse Practitioner

## 2020-08-26 DIAGNOSIS — R399 Unspecified symptoms and signs involving the genitourinary system: Secondary | ICD-10-CM

## 2020-08-26 DIAGNOSIS — M545 Low back pain, unspecified: Secondary | ICD-10-CM

## 2020-08-26 NOTE — Progress Notes (Signed)
Based on what you shared with me it looks like you have urinary symptoms with back pain.,that should be evaluated in a face to face office visit. Due to the associating back, you will need a face to face visit for urinalysis and urine culture inorder to be treated properly.    NOTE: If you entered your credit card information for this eVisit, you will not be charged. You may see a "hold" on your card for the $35 but that hold will drop off and you will not have a charge processed.  If you are having a true medical emergency please call 911.     For an urgent face to face visit, [facility] has four urgent care centers for your convenience:   . [facility]    [phone]                  Get Driving Directions  [address], [place] [address] . 10 am to 8 pm Monday-Friday . 12 pm to 8 pm Saturday-Sunday   . [facility] at [place]                  Get Driving Directions  [address], [place], [place] [address] . 8 am to 8 pm Monday-Friday . 9 am to 6 pm Saturday . 11 am to 6 pm Sunday   . [facility] at [place]                  Get Driving Directions   [address], [place] [address] . 8 am to 8 pm Monday-Friday . 8 am to 4 pm Saturday-Sunday    . [facility] at [place]                    Get Driving Directions  [phone]  [address], [place],  [address]  . Monday-Friday, 12 PM to 6 PM    Your e-visit answers were reviewed by a board certified advanced clinical practitioner to complete your personal care plan.  Thank you for using e-Visits.  5-10 minutes spent reviewing and documenting in chart.

## 2020-08-29 ENCOUNTER — Other Ambulatory Visit: Payer: Self-pay

## 2020-08-29 ENCOUNTER — Ambulatory Visit
Payer: Medicare HMO | Attending: Student in an Organized Health Care Education/Training Program | Admitting: Student in an Organized Health Care Education/Training Program

## 2020-08-29 ENCOUNTER — Encounter: Payer: Self-pay | Admitting: Student in an Organized Health Care Education/Training Program

## 2020-08-29 VITALS — BP 108/78 | HR 65 | Temp 97.3°F | Resp 16 | Ht 61.0 in | Wt 104.0 lb

## 2020-08-29 DIAGNOSIS — M12812 Other specific arthropathies, not elsewhere classified, left shoulder: Secondary | ICD-10-CM | POA: Insufficient documentation

## 2020-08-29 DIAGNOSIS — G8929 Other chronic pain: Secondary | ICD-10-CM | POA: Diagnosis not present

## 2020-08-29 DIAGNOSIS — M75101 Unspecified rotator cuff tear or rupture of right shoulder, not specified as traumatic: Secondary | ICD-10-CM | POA: Diagnosis not present

## 2020-08-29 DIAGNOSIS — M25511 Pain in right shoulder: Secondary | ICD-10-CM | POA: Insufficient documentation

## 2020-08-29 DIAGNOSIS — R300 Dysuria: Secondary | ICD-10-CM | POA: Diagnosis not present

## 2020-08-29 DIAGNOSIS — M5136 Other intervertebral disc degeneration, lumbar region: Secondary | ICD-10-CM | POA: Diagnosis not present

## 2020-08-29 DIAGNOSIS — M160 Bilateral primary osteoarthritis of hip: Secondary | ICD-10-CM

## 2020-08-29 DIAGNOSIS — M47816 Spondylosis without myelopathy or radiculopathy, lumbar region: Secondary | ICD-10-CM | POA: Diagnosis not present

## 2020-08-29 DIAGNOSIS — M25551 Pain in right hip: Secondary | ICD-10-CM | POA: Diagnosis not present

## 2020-08-29 DIAGNOSIS — M75102 Unspecified rotator cuff tear or rupture of left shoulder, not specified as traumatic: Secondary | ICD-10-CM | POA: Diagnosis not present

## 2020-08-29 DIAGNOSIS — M25552 Pain in left hip: Secondary | ICD-10-CM | POA: Diagnosis not present

## 2020-08-29 DIAGNOSIS — M12811 Other specific arthropathies, not elsewhere classified, right shoulder: Secondary | ICD-10-CM | POA: Insufficient documentation

## 2020-08-29 DIAGNOSIS — N390 Urinary tract infection, site not specified: Secondary | ICD-10-CM | POA: Diagnosis not present

## 2020-08-29 DIAGNOSIS — M25512 Pain in left shoulder: Secondary | ICD-10-CM | POA: Diagnosis not present

## 2020-08-29 DIAGNOSIS — G894 Chronic pain syndrome: Secondary | ICD-10-CM | POA: Diagnosis not present

## 2020-08-29 MED ORDER — TRAMADOL HCL 100 MG PO TABS: 1.5000 | ORAL_TABLET | Freq: Two times a day (BID) | ORAL | 2 refills | Status: DC | PRN

## 2020-08-29 NOTE — Progress Notes (Signed)
PROVIDER NOTE: Information contained herein reflects review and annotations entered in association with encounter. Interpretation of such information and data should be left to medically-trained personnel. Information provided to patient can be located elsewhere in the medical record under "Patient Instructions". Document created using STT-dictation technology, any transcriptional errors that may result from process are unintentional.    Patient: Melissa Montes  Service Category: E/M  Provider: Gillis Santa, MD  DOB: 1956-06-23  DOS: 08/29/2020  Specialty: Interventional Pain Management  MRN: [id number]  Setting: Ambulatory outpatient  PCP: Melissa Maltese, MD  Type: Established Patient    Referring Provider: Perrin Maltese, MD  Location: Office  Delivery: Face-to-face     HPI  [name], a 64 y.o. year old female, is here today because of her Bilateral hip pain [M25.551, M25.552]. [name]'s primary complain today is Back Pain (lower) Last encounter: My last encounter with her was on 06/28/2020. Pertinent problems: [name] has S/P coronary artery stent placement; Peripheral vascular disease ([place]); GAD (generalized anxiety disorder); S/P coronary artery bypass graft x 2; Osteoporosis, post-menopausal; Chronic pain syndrome; Lumbar facet arthropathy; Bilateral hip pain; Primary osteoarthritis of both hips; Chronic left shoulder pain; Right rotator cuff tear arthropathy; Left rotator cuff tear arthropathy; and Lumbar degenerative disc disease on their pertinent problem list. Pain Assessment: Severity of Chronic pain is reported as a 6 /10. Location: Back Right, Left, Lower/NEW c/o independent bilateral pains in shoulders, hip and knees.. Onset: More than a month ago. Quality: Aching. Timing: Constant. Modifying factor(s): meds take the edge off. Vitals:  height is 5' 1" (1.549 m) and weight is 104 lb (47.2 kg). Her temporal temperature is 97.3 F (36.3 C) (abnormal). Her blood  pressure is 108/78 and her pulse is 65. Her respiration is 16 and oxygen saturation is 99%.   Reason for encounter: medication management.   Patient presents today for refill of tramadol.  Is utilizing 1.5 tablets twice daily.  Does endorse analgesic benefit with intake of medication and improvement in functional status.  No noticeable side effects.  We will refill as below.  Pharmacotherapy Assessment   06/28/2020  1   06/28/2020  Tramadol Hcl 100 MG Tablet  90.00  30 Bi Lat   [id number]   [facility] ([id number])   0/1  30.00 MME  Medicare   [facility]     Analgesic: Tramadol 100 mg TID PRN #90/month MME 30   Monitoring: [place] PMP: PDMP reviewed during this encounter.       Pharmacotherapy: No side-effects or adverse reactions reported. Compliance: No problems identified. Effectiveness: Clinically acceptable.  Rise Patience, RN  08/29/2020  3:10 PM  Sign when Signing Visit Nursing Pain Medication Assessment:  Safety precautions to be maintained throughout the outpatient stay will include: orient to surroundings, keep bed in low position, maintain call bell within reach at all times, provide assistance with transfer out of bed and ambulation.  Medication Inspection Compliance: Pill count conducted under aseptic conditions, in front of the patient. Neither the pills nor the bottle was removed from the patient's sight at any time. Once count was completed pills were immediately returned to the patient in their original bottle.  Medication: Tramadol (Ultram) Pill/Patch Count: 10 of 90 pills remain Pill/Patch Appearance: Markings consistent with prescribed medication Bottle Appearance: Standard pharmacy container. Clearly labeled. Filled Date: 08 / 10 / 21 Last Medication intake:  Today    UDS: No results found for: SUMMARY   ROS  Constitutional: Denies any fever or  chills Gastrointestinal: No reported hemesis, hematochezia, vomiting, or acute GI distress Musculoskeletal: Denies any acute onset joint  swelling, redness, loss of ROM, or weakness Neurological: No reported episodes of acute onset apraxia, aphasia, dysarthria, agnosia, amnesia, paralysis, loss of coordination, or loss of consciousness  Medication Review  Vitamin D3, aspirin EC, atorvastatin, budesonide-formoterol, ezetimibe, hydrOXYzine, metoprolol succinate, multivitamin, nitroGLYCERIN, ondansetron, promethazine, and traMADol HCl  History Review  Allergy: [name] is allergic to penicillins, amoxicillin-pot clavulanate, and keflex [cephalexin]. Drug: [name]  reports no history of drug use. Alcohol:  reports no history of alcohol use. Tobacco:  reports that she quit smoking about 1 years ago. Her smoking use included cigarettes. She has a 41.00 pack-year smoking history. She has never used smokeless tobacco. Social: [name]  reports that she quit smoking about 1 years ago. Her smoking use included cigarettes. She has a 41.00 pack-year smoking history. She has never used smokeless tobacco. She reports that she does not drink alcohol and does not use drugs. Medical:  has a past medical history of Anxiety, Bipolar disorder ([place]), Depression, Diverticulitis, Heart disease, Hyperlipidemia, and MI (myocardial infarction) ([place]). Surgical: [name]  has a past surgical history that includes Coronary angioplasty with stent; Abdominal hysterectomy; CAROTID ANGIOGRAPHY (N/A, 01/13/2018); and Left Heart Cath (Right, 07/07/2018). Family: family history includes Breast cancer in her maternal aunt; Breast cancer (age of onset: 93) in her mother; Depression in her sister.  Laboratory Chemistry Profile   Renal Lab Results  Component Value Date   BUN 20 07/17/2020   CREATININE 0.63 07/17/2020   GFRAA >60 07/17/2020   GFRNONAA >60 07/17/2020     Hepatic Lab Results  Component Value Date   AST 22 07/17/2020   ALT 16 07/17/2020   ALBUMIN 4.1 07/17/2020   ALKPHOS 107 07/17/2020     Electrolytes Lab Results  Component  Value Date   NA 138 07/17/2020   K 4.4 07/17/2020   CL 105 07/17/2020   CALCIUM 9.4 07/17/2020     Bone No results found for: VD25OH, VD125OH2TOT, [id number], [id number], 25OHVITD1, 25OHVITD2, 25OHVITD3, TESTOFREE, TESTOSTERONE   Inflammation (CRP: Acute Phase) (ESR: Chronic Phase) No results found for: CRP, ESRSEDRATE, LATICACIDVEN     Note: Above Lab results reviewed.  Recent Imaging Review  US BREAST LTD UNI LEFT INC AXILLA CLINICAL DATA:  64 year old female with a palpable lump and associated tenderness in the left breast for 3 weeks.  EXAM: DIGITAL DIAGNOSTIC BILATERAL MAMMOGRAM WITH CAD AND TOMO  ULTRASOUND LEFT BREAST  COMPARISON:  Previous exam(s).  ACR Breast Density Category b: There are scattered areas of fibroglandular density.  FINDINGS: A radiopaque BB was placed at the site of the patient's focal symptoms in the lower inner left breast. No focal or suspicious mammographic findings are seen deep to the radiopaque BB. No suspicious findings are identified in either breast. The parenchymal pattern is stable.  Mammographic images were processed with CAD.  On physical exam, I palpate thickening along the left inframammary fold medially. No suspicious lumps are palpated.  Targeted ultrasound is performed, showing normal fibroglandular tissue without focal or suspicious sonographic abnormality. Evaluation of the lower inner quadrant at the [facility] was performed.  IMPRESSION: 1. No mammographic evidence of malignancy in either breast. 2. No focal or suspicious mammographic or sonographic findings at the site of the patient's left breast palpable lump/pain. Recommendation is for clinical and symptomatic follow-up.  RECOMMENDATION: 1. Clinical follow-up recommended for the palpable/painful area of concern in the left breast. Any further  workup should be based on clinical grounds. 2.  Screening mammogram in one year.(Code:SM-B-01Y)  I have discussed the findings  and recommendations with the patient. If applicable, a reminder letter will be sent to the patient regarding the next appointment.  BI-RADS CATEGORY  1: Negative.  Electronically Signed   By: Serena  Chacko M.D.   On: 07/27/2020 15:22 MM DIAG BREAST TOMO BILATERAL CLINICAL DATA:  64-year-old female with a palpable lump and associated tenderness in the left breast for 3 weeks.  EXAM: DIGITAL DIAGNOSTIC BILATERAL MAMMOGRAM WITH CAD AND TOMO  ULTRASOUND LEFT BREAST  COMPARISON:  Previous exam(s).  ACR Breast Density Category b: There are scattered areas of fibroglandular density.  FINDINGS: A radiopaque BB was placed at the site of the patient's focal symptoms in the lower inner left breast. No focal or suspicious mammographic findings are seen deep to the radiopaque BB. No suspicious findings are identified in either breast. The parenchymal pattern is stable.  Mammographic images were processed with CAD.  On physical exam, I palpate thickening along the left inframammary fold medially. No suspicious lumps are palpated.  Targeted ultrasound is performed, showing normal fibroglandular tissue without focal or suspicious sonographic abnormality. Evaluation of the lower inner quadrant at the [facility] was performed.  IMPRESSION: 1. No mammographic evidence of malignancy in either breast. 2. No focal or suspicious mammographic or sonographic findings at the site of the patient's left breast palpable lump/pain. Recommendation is for clinical and symptomatic follow-up.  RECOMMENDATION: 1. Clinical follow-up recommended for the palpable/painful area of concern in the left breast. Any further workup should be based on clinical grounds. 2.  Screening mammogram in one year.(Code:SM-B-01Y)  I have discussed the findings and recommendations with the patient. If applicable, a reminder letter will be sent to the patient regarding the next appointment.  BI-RADS CATEGORY  1: Negative.   Electronically Signed   By: Serena  Chacko M.D.   On: 07/27/2020 15:22 Note: Reviewed        Physical Exam  General appearance: Well nourished, well developed, and well hydrated. In no apparent acute distress Mental status: Alert, oriented x 3 (person, place, & time)       Respiratory: No evidence of acute respiratory distress Eyes: PERLA Vitals: BP 108/78   Pulse 65   Temp (!) 97.3 F (36.3 C) (Temporal)   Resp 16   Ht 5' 1" (1.549 m)   Wt 104 lb (47.2 kg)   SpO2 99%   BMI 19.65 kg/m  BMI: Estimated body mass index is 19.65 kg/m as calculated from the following:   Height as of this encounter: 5' 1" (1.549 m).   Weight as of this encounter: 104 lb (47.2 kg). Ideal: Ideal body weight: 47.8 kg (105 lb 6.1 oz)   Cervical Spine Area Exam  Skin & Axial Inspection: No masses, redness, edema, swelling, or associated skin lesions Alignment: Symmetrical Functional ROM: Pain restricted ROM      Stability: No instability detected Muscle Tone/Strength: Functionally intact. No obvious neuro-muscular anomalies detected. Sensory (Neurological): Musculoskeletal pain pattern  Upper Extremity (UE) Exam    Side: Right upper extremity  Side: Left upper extremity  Skin & Extremity Inspection: Skin color, temperature, and hair growth are WNL. No peripheral edema or cyanosis. No masses, redness, swelling, asymmetry, or associated skin lesions. No contractures.  Skin & Extremity Inspection: Skin color, temperature, and hair growth are WNL. No peripheral edema or cyanosis. No masses, redness, swelling, asymmetry, or associated skin lesions. No contractures.  Functional   ROM: Pain restricted ROM for shoulder  Functional ROM: Pain restricted ROM for shoulder  Muscle Tone/Strength: Functionally intact. No obvious neuro-muscular anomalies detected.  Muscle Tone/Strength: Functionally intact. No obvious neuro-muscular anomalies detected.  Sensory (Neurological): Arthropathic arthralgia           Sensory (Neurological): Arthropathic arthralgia          Palpation: No palpable anomalies              Palpation: No palpable anomalies              Provocative Test(s):  Phalen's test: deferred Tinel's test: deferred Apley's scratch test (touch opposite shoulder):  Action 1 (Across chest): Decreased ROM Action 2 (Overhead): Decreased ROM Action 3 (LB reach): Decreased ROM   Provocative Test(s):  Phalen's test: deferred Tinel's test: deferred Apley's scratch test (touch opposite shoulder):  Action 1 (Across chest): Decreased ROM Action 2 (Overhead): Decreased ROM Action 3 (LB reach): Decreased ROM    Lumbar Spine Area Exam  Skin & Axial Inspection: No masses, redness, or swelling Alignment: Symmetrical Functional ROM: Pain restricted ROM affecting both sides Stability: No instability detected Muscle Tone/Strength: Functionally intact. No obvious neuro-muscular anomalies detected. Sensory (Neurological): Musculoskeletal pain pattern Palpation: No palpable anomalies       Provocative Tests: Hyperextension/rotation test: (+) bilaterally for facet joint pain.  Improved after treatment Lumbar quadrant test (Kemp's test): (+) bilaterally for facet joint pain.  Improved after treatment  Gait & Posture Assessment  Ambulation: Unassisted Gait: Relatively normal for age and body habitus Posture: WNL  Lower Extremity Exam    Side: Right lower extremity  Side: Left lower extremity  Stability: No instability observed          Stability: No instability observed          Skin & Extremity Inspection: Skin color, temperature, and hair growth are WNL. No peripheral edema or cyanosis. No masses, redness, swelling, asymmetry, or associated skin lesions. No contractures.  Skin & Extremity Inspection: Skin color, temperature, and hair growth are WNL. No peripheral edema or cyanosis. No masses, redness, swelling, asymmetry, or associated skin lesions. No contractures.  Functional ROM: Pain  restricted ROM for hip and knee joints          Functional ROM: Pain restricted ROM for hip and knee joints          Muscle Tone/Strength: Functionally intact. No obvious neuro-muscular anomalies detected.  Muscle Tone/Strength: Functionally intact. No obvious neuro-muscular anomalies detected.  Sensory (Neurological): Musculoskeletal pain pattern        Sensory (Neurological): Musculoskeletal pain pattern        DTR: Patellar: deferred today Achilles: deferred today Plantar: deferred today  DTR: Patellar: deferred today Achilles: deferred today Plantar: deferred today  Palpation: No palpable anomalies  Palpation: No palpable anomalies   Assessment   Status Diagnosis  Controlled Controlled Controlled 1. Bilateral hip pain   2. Primary osteoarthritis of both hips   3. Chronic right shoulder pain   4. Right rotator cuff tear arthropathy   5. Chronic left shoulder pain   6. Left rotator cuff tear arthropathy   7. Lumbar degenerative disc disease   8. Chronic pain syndrome   9. Lumbar facet arthropathy       Plan of Care  [name] has a current medication list which includes the following long-term medication(s): atorvastatin, ezetimibe, metoprolol succinate, nitroglycerin, promethazine, symbicort, and [START ON 09/29/2020] tramadol hcl.  Pharmacotherapy (Medications Ordered): Meds ordered this encounter    Medications  . traMADol HCl 100 MG TABS    Sig: Take 1.5 tablets by mouth 2 (two) times daily as needed.    Dispense:  90 tablet    Refill:  2    For chronic pain syndrome   Status post lumbar radiofrequency ablation in April and May 2021 respectively for the left and right side respectively.  Can consider repeating in future.  Could also consider sprint peripheral nerve stimulation of lumbar medial branch.  Orders:  Orders Placed This Encounter  Procedures  . ToxASSURE Select 13 (MW), Urine    Volume: 30 ml(s). Minimum 3 ml of urine is needed.  Document temperature of fresh sample. Indications: Long term (current) use of opiate analgesic (G64.403)    Order Specific Question:   Release to patient    Answer:   Immediate  . Drug Screen 10 W/Conf, Serum    Order Specific Question:   Release to patient    Answer:   Immediate   Follow-up plan:   Return in about 4 months (around 12/30/2020) for Medication Management, in person.     Status post bilateral L3, L4, L5 facet medial branch nerve block #2 3/15/202, 90% pain relief for 10-12 hrs, proceed with RFA.  Left L3, L4, L5 RFA 03/14/2020; Right L3,4,5 RFA 04/11/20.  Repeat as needed, consider sprint peripheral nerve stimulation of lumbar medial branch        Recent Visits Date Type Provider Dept  06/28/20 Office Visit Gillis Santa, MD [facility]  Showing recent visits within past 90 days and meeting all other requirements Today's Visits Date Type Provider Dept  08/29/20 Office Visit Gillis Santa, MD [facility]  Showing today's visits and meeting all other requirements Future Appointments No visits were found meeting these conditions. Showing future appointments within next 90 days and meeting all other requirements  I discussed the assessment and treatment plan with the patient. The patient was provided an opportunity to ask questions and all were answered. The patient agreed with the plan and demonstrated an understanding of the instructions.  Patient advised to call back or seek an in-person evaluation if the symptoms or condition worsens.  Duration of encounter:30 minutes.  Note by: Gillis Santa, MD Date: 08/29/2020; Time: 3:37 PM

## 2020-08-29 NOTE — Progress Notes (Signed)
Nursing Pain Medication Assessment:  Safety precautions to be maintained throughout the outpatient stay will include: orient to surroundings, keep bed in low position, maintain call bell within reach at all times, provide assistance with transfer out of bed and ambulation.  Medication Inspection Compliance: Pill count conducted under aseptic conditions, in front of the patient. Neither the pills nor the bottle was removed from the patient's sight at any time. Once count was completed pills were immediately returned to the patient in their original bottle.  Medication: Tramadol (Ultram) Pill/Patch Count: 10 of 90 pills remain Pill/Patch Appearance: Markings consistent with prescribed medication Bottle Appearance: Standard pharmacy container. Clearly labeled. Filled Date: 08 / 10 / 21 Last Medication intake:  Today

## 2020-08-29 NOTE — Patient Instructions (Signed)
Tramadol to last until 12/29/2020 has been escribed to your pharmacy.  You will have labs drawn today.

## 2020-08-31 ENCOUNTER — Telehealth: Payer: Medicare Other | Admitting: Psychiatry

## 2020-09-01 ENCOUNTER — Telehealth: Payer: Self-pay

## 2020-09-01 DIAGNOSIS — F3176 Bipolar disorder, in full remission, most recent episode depressed: Secondary | ICD-10-CM

## 2020-09-01 DIAGNOSIS — R42 Dizziness and giddiness: Secondary | ICD-10-CM | POA: Diagnosis not present

## 2020-09-01 DIAGNOSIS — N390 Urinary tract infection, site not specified: Secondary | ICD-10-CM | POA: Diagnosis not present

## 2020-09-01 DIAGNOSIS — M6281 Muscle weakness (generalized): Secondary | ICD-10-CM | POA: Diagnosis not present

## 2020-09-01 DIAGNOSIS — R319 Hematuria, unspecified: Secondary | ICD-10-CM | POA: Diagnosis not present

## 2020-09-01 DIAGNOSIS — F411 Generalized anxiety disorder: Secondary | ICD-10-CM

## 2020-09-01 DIAGNOSIS — G4700 Insomnia, unspecified: Secondary | ICD-10-CM

## 2020-09-01 DIAGNOSIS — M25559 Pain in unspecified hip: Secondary | ICD-10-CM | POA: Diagnosis not present

## 2020-09-01 LAB — DRUG SCREEN 10 W/CONF, SERUM
Amphetamines, IA: NEGATIVE ng/mL
Barbiturates, IA: NEGATIVE ug/mL
Benzodiazepines, IA: NEGATIVE ng/mL
Cocaine & Metabolite, IA: NEGATIVE ng/mL
Methadone, IA: NEGATIVE ng/mL
Opiates, IA: NEGATIVE ng/mL
Oxycodones, IA: NEGATIVE ng/mL
Phencyclidine, IA: NEGATIVE ng/mL
Propoxyphene, IA: NEGATIVE ng/mL
THC(Marijuana) Metabolite, IA: NEGATIVE ng/mL

## 2020-09-01 MED ORDER — VENLAFAXINE HCL ER 75 MG PO CP24: 75.0000 mg | ORAL_CAPSULE | Freq: Every day | ORAL | 1 refills | Status: DC

## 2020-09-01 MED ORDER — HYDROXYZINE PAMOATE 25 MG PO CAPS: 25.0000 mg | ORAL_CAPSULE | Freq: Two times a day (BID) | ORAL | 1 refills | Status: DC | PRN

## 2020-09-01 MED ORDER — BELSOMRA 5 MG PO TABS: 5.0000 mg | ORAL_TABLET | Freq: Every day | ORAL | 0 refills | Status: DC

## 2020-09-01 NOTE — Telephone Encounter (Signed)
Returned call to patient.  She reports she is having a lot of anxiety.  She is compliant on the venlafaxine.  Will increase to 75 mg p.o. daily Will also increase hydroxyzine to 25 mg twice a day as needed for severe anxiety attacks She is interested in starting Belsomra for sleep.  Provided education. Start Belsomra 5 mg p.o. nightly.  Discussed starting psychotherapy sessions if she continues to be anxious.

## 2020-09-01 NOTE — Telephone Encounter (Signed)
pt called states that the hydroxyzine is not working she wants to know if she can try something else.  she is still having alot of anxiety.

## 2020-09-02 DIAGNOSIS — M6281 Muscle weakness (generalized): Secondary | ICD-10-CM | POA: Diagnosis not present

## 2020-09-02 DIAGNOSIS — R42 Dizziness and giddiness: Secondary | ICD-10-CM | POA: Diagnosis not present

## 2020-09-02 DIAGNOSIS — R319 Hematuria, unspecified: Secondary | ICD-10-CM | POA: Diagnosis not present

## 2020-09-02 DIAGNOSIS — M25559 Pain in unspecified hip: Secondary | ICD-10-CM | POA: Diagnosis not present

## 2020-09-02 DIAGNOSIS — N390 Urinary tract infection, site not specified: Secondary | ICD-10-CM | POA: Diagnosis not present

## 2020-09-07 ENCOUNTER — Telehealth (INDEPENDENT_AMBULATORY_CARE_PROVIDER_SITE_OTHER): Payer: Self-pay | Admitting: Vascular Surgery

## 2020-09-07 NOTE — Telephone Encounter (Signed)
Patient called and canceled her Carotid, AAA, and f/u appt for  07/20/20 on 07/19/20 and stated she was going to call back to reschedule. Per Arna Medici please reschedule these appt's at patient convenience and provider preference. Thank you

## 2020-09-07 NOTE — Telephone Encounter (Signed)
Called stating that she was seen by Dr. Humphrey Rolls yesterday and they noticed her aneurysm   has grown from 3.9-4.6 and they would like for her to come in to be seen. Patient was last seen 07-20-20 with AAA & Carotid studies ([facility]). Please advise.

## 2020-09-13 ENCOUNTER — Telehealth: Payer: Self-pay

## 2020-09-13 NOTE — Telephone Encounter (Signed)
Spoke with Dr Holley Raring and he states that if the patient would like to reach out to spine specialist that it would be OK with him.  Patient notified.

## 2020-09-13 NOTE — Telephone Encounter (Signed)
She left a vm stating her pain is severe, its so bad she is shaking. She doesn't want to go to the ER so please call her

## 2020-09-13 NOTE — Telephone Encounter (Signed)
Dr Holley Raring, the patient called and states that her pain is getting worse and she thinks that maybe she should see a spine specialist.  States that you cant go up on her Tramadol and she dont know what she should do.

## 2020-09-15 ENCOUNTER — Encounter (INDEPENDENT_AMBULATORY_CARE_PROVIDER_SITE_OTHER): Payer: Self-pay | Admitting: Nurse Practitioner

## 2020-09-15 ENCOUNTER — Other Ambulatory Visit: Payer: Self-pay

## 2020-09-15 ENCOUNTER — Ambulatory Visit (INDEPENDENT_AMBULATORY_CARE_PROVIDER_SITE_OTHER): Payer: Medicare HMO

## 2020-09-15 ENCOUNTER — Encounter: Payer: Medicare HMO | Admitting: Student in an Organized Health Care Education/Training Program

## 2020-09-15 ENCOUNTER — Ambulatory Visit (INDEPENDENT_AMBULATORY_CARE_PROVIDER_SITE_OTHER): Payer: Medicare HMO | Admitting: Nurse Practitioner

## 2020-09-15 VITALS — BP 135/82 | HR 91 | Resp 16 | Wt 102.8 lb

## 2020-09-15 DIAGNOSIS — I714 Abdominal aortic aneurysm, without rupture, unspecified: Secondary | ICD-10-CM

## 2020-09-15 DIAGNOSIS — I10 Essential (primary) hypertension: Secondary | ICD-10-CM | POA: Diagnosis not present

## 2020-09-15 DIAGNOSIS — I6523 Occlusion and stenosis of bilateral carotid arteries: Secondary | ICD-10-CM

## 2020-09-21 ENCOUNTER — Encounter (INDEPENDENT_AMBULATORY_CARE_PROVIDER_SITE_OTHER): Payer: Self-pay | Admitting: Nurse Practitioner

## 2020-09-21 NOTE — Progress Notes (Signed)
Subjective:    Patient ID: Melissa Montes, female    DOB: 1955/11/21, 64 y.o.   MRN: [id number] Chief Complaint  Patient presents with  . Follow-up    ultrasound follow up    The patient is seen for follow up evaluation of carotid stenosis. The carotid stenosis followed by ultrasound.   The patient denies amaurosis fugax. There is no recent history of TIA symptoms or focal motor deficits. There is no prior documented CVA.  The patient is taking enteric-coated aspirin 81 mg daily.  There is no history of migraine headaches. There is no history of seizures.  The patient has a history of coronary artery disease, no recent episodes of angina or shortness of breath. The patient denies PAD or claudication symptoms. There is a history of hyperlipidemia which is being treated with a statin.    Carotid Duplex done today shows 1-39%.  No change compared to last study in 06/26/2019  The patient returns to the office for surveillance of a known abdominal aortic aneurysm. Patient denies abdominal pain or back pain, no other abdominal complaints. No changes suggesting embolic episodes.   There have been no interval changes in the patient's overall health care since his last visit.  Patient denies amaurosis fugax or TIA symptoms. There is no history of claudication or rest pain symptoms of the lower extremities. The patient denies angina or shortness of breath.   Duplex US of the aorta and iliac arteries shows an AAA measured 4.0 cm with no iliac artery aneurysms.  This is not greatly changed from previous ultrasound in 12/29/2019 however a recent outside noncontrasted CT scan showed abdominal aortic aneurysm of about 4.6 cm.   Review of Systems  Cardiovascular: Negative for leg swelling.  Psychiatric/Behavioral: The patient is nervous/anxious.   All other systems reviewed and are negative.      Objective:   Physical Exam Vitals reviewed.  Neck:     Vascular: No carotid bruit.   Cardiovascular:     Rate and Rhythm: Normal rate.     Pulses: Normal pulses.  Pulmonary:     Effort: Pulmonary effort is normal.  Skin:    General: Skin is warm and dry.  Neurological:     Mental Status: She is alert and oriented to person, place, and time.  Psychiatric:        Mood and Affect: Mood normal.        Behavior: Behavior normal.        Thought Content: Thought content normal.        Judgment: Judgment normal.     BP 135/82 (BP Location: Right Arm)   Pulse 91   Resp 16   Wt 102 lb 12.8 oz (46.6 kg)   BMI 19.42 kg/m   Past Medical History:  Diagnosis Date  . Anxiety   . Bipolar disorder ([place])   . Depression   . Diverticulitis   . Heart disease   . Hyperlipidemia   . MI (myocardial infarction) [facility])     Social History   Socioeconomic History  . Marital status: Divorced    Spouse name: Not on file  . Number of children: Not on file  . Years of education: Not on file  . Highest education level: Not on file  Occupational History  . Occupation: disabled  Tobacco Use  . Smoking status: Former Smoker    Packs/day: 1.00    Years: 41.00    Pack years: 41.00    Types: Cigarettes  Quit date: 09/19/2018    Years since quitting: 2.0  . Smokeless tobacco: Never Used  . Tobacco comment: uses nicotine gums  Substance and Sexual Activity  . Alcohol use: No  . Drug use: No  . Sexual activity: Not on file  Other Topics Concern  . Not on file  Social History Narrative  . Not on file   Social Determinants of Health   Financial Resource Strain:   . Difficulty of Paying Living Expenses: Not on file  Food Insecurity:   . Worried About Charity fundraiser in the Last Year: Not on file  . Ran Out of Food in the Last Year: Not on file  Transportation Needs:   . Lack of Transportation (Medical): Not on file  . Lack of Transportation (Non-Medical): Not on file  Physical Activity:   . Days of Exercise per Week: Not on file  . Minutes of Exercise per Session:  Not on file  Stress:   . Feeling of Stress : Not on file  Social Connections:   . Frequency of Communication with Friends and Family: Not on file  . Frequency of Social Gatherings with Friends and Family: Not on file  . Attends Religious Services: Not on file  . Active Member of Clubs or Organizations: Not on file  . Attends Archivist Meetings: Not on file  . Marital Status: Not on file  Intimate Partner Violence:   . Fear of Current or Ex-Partner: Not on file  . Emotionally Abused: Not on file  . Physically Abused: Not on file  . Sexually Abused: Not on file    Past Surgical History:  Procedure Laterality Date  . ABDOMINAL HYSTERECTOMY    . CAROTID ANGIOGRAPHY N/A 01/13/2018   Procedure: CAROTID ANGIOGRAPHY;  Surgeon: Algernon Huxley, MD;  Location: [facility];  Service: Cardiovascular;  Laterality: N/A;  . CORONARY ANGIOPLASTY WITH STENT PLACEMENT    . LEFT HEART CATH Right 07/07/2018   Procedure: Left Heart Cath and Coronary Angiography;  Surgeon: Dionisio David, MD;  Location: [facility];  Service: Cardiovascular;  Laterality: Right;    Family History  Problem Relation Age of Onset  . Breast cancer Mother 34  . Breast cancer Maternal Aunt        mat aunt and great aunt  . Depression Sister     Allergies  Allergen Reactions  . Penicillins Swelling  . Amoxicillin-Pot Clavulanate   . Keflex [Cephalexin] Hives    CBC Latest Ref Rng & Units 07/17/2020 05/03/2020 03/31/2019  WBC 4.0 - 10.5 K/uL 9.3 9.1 11.0(H)  Hemoglobin 12.0 - 15.0 g/dL 13.9 14.4 13.9  Hematocrit 36 - 46 % 40.5 41.5 41.2  Platelets 150 - 400 K/uL 269 262 234      CMP     Component Value Date/Time   NA 138 07/17/2020 0855   K 4.4 07/17/2020 0855   CL 105 07/17/2020 0855   CO2 23 07/17/2020 0855   GLUCOSE 114 (H) 07/17/2020 0855   BUN 20 07/17/2020 0855   CREATININE 0.63 07/17/2020 0855   CALCIUM 9.4 07/17/2020 0855   PROT 7.0 07/17/2020 0855   ALBUMIN 4.1  07/17/2020 0855   AST 22 07/17/2020 0855   ALT 16 07/17/2020 0855   ALKPHOS 107 07/17/2020 0855   BILITOT 1.0 07/17/2020 0855   GFRNONAA >60 07/17/2020 0855   GFRAA >60 07/17/2020 0855         Assessment & Plan:   1. AAA (  abdominal aortic aneurysm) without rupture (HCC) Recommend: The patient has an abdominal aortic aneurysm that is 4.7 cm by outside imaging.  This has shown considerable growth from 3.9 cm in under a year.  Given the patient's very petite size, it is appropriate to proceed with endovascular intervention.   The patient is otherwise in reasonable health.   Therefore, the patient should undergo endovascular repair of the AAA to prevent future leathal rupture.   Patient will require CT angiography of the abdomen and pelvis in order to appropriately plan repair of the AAA.  The risks and benefits as well as the alternative therapies was discussed in detail with the patient. All questions were answered. The patient agrees to move forward the AAA repair and therefore with CT scan.  The patient will follow up with me in the office after the CT scan to review the study. - CT Angio Abd/Pel w/ and/or w/o; Future  2. Benign essential hypertension Continue antihypertensive medications as already ordered, these medications have been reviewed and there are no changes at this time.   3. Bilateral carotid artery stenosis Recommend:  Given the patient's asymptomatic subcritical stenosis no further invasive testing or surgery at this time.  Duplex ultrasound shows 1-39% stenosis bilaterally.  Continue antiplatelet therapy as prescribed Continue management of CAD, HTN and Hyperlipidemia Healthy heart diet,  encouraged exercise at least 4 times per week Follow up in 12 months with duplex ultrasound and physical exam    Current Outpatient Medications on File Prior to Visit  Medication Sig Dispense Refill  . aspirin EC 81 MG tablet Take 162 mg by mouth every evening.     Marland Kitchen  atorvastatin (LIPITOR) 80 MG tablet Take 80 mg by mouth every evening.     . Cholecalciferol (VITAMIN D3) 1.25 MG (50000 UT) CAPS Take 1 tablet by mouth once a week.    . ezetimibe (ZETIA) 10 MG tablet Take 10 mg by mouth daily.     . fluticasone (FLONASE) 50 MCG/ACT nasal spray Place 1 spray into both nostrils daily.    . hydrOXYzine (VISTARIL) 25 MG capsule Take 1 capsule (25 mg total) by mouth 2 (two) times daily as needed. For anxiety attacks only 60 capsule 1  . metoprolol succinate (TOPROL-XL) 25 MG 24 hr tablet Take 25 mg by mouth daily.     . Multiple Vitamin (MULTIVITAMIN) tablet Take 1 tablet by mouth daily. Centrum Silver for Women    . nitroGLYCERIN (NITROSTAT) 0.4 MG SL tablet Place 0.4 mg under the tongue every 5 (five) minutes x 3 doses as needed for chest pain.     . promethazine (PHENERGAN) 25 MG tablet Take 25 mg by mouth every 8 (eight) hours as needed for nausea or vomiting.    . Suvorexant (BELSOMRA) 5 MG TABS Take 5 mg by mouth at bedtime. For sleep 30 tablet 0  . SYMBICORT 80-4.5 MCG/ACT inhaler SMARTSIG:1 Puff(s) By Mouth Every 12 Hours    . [START ON 09/29/2020] traMADol HCl 100 MG TABS Take 1.5 tablets by mouth 2 (two) times daily as needed. 90 tablet 2  . traZODone (DESYREL) 100 MG tablet Take 100 mg by mouth at bedtime.    Marland Kitchen venlafaxine XR (EFFEXOR XR) 75 MG 24 hr capsule Take 1 capsule (75 mg total) by mouth daily with breakfast. 30 capsule 1  . ciprofloxacin (CIPRO) 500 MG tablet Take 500 mg by mouth daily. (Patient not taking: Reported on 09/15/2020)    . ondansetron (ZOFRAN) 4 MG tablet Take  1 tablet by mouth 4 (four) times daily. (Patient not taking: Reported on 09/15/2020)    . sulfamethoxazole-trimethoprim (BACTRIM DS) 800-160 MG tablet Take 1 tablet by mouth 2 (two) times daily. (Patient not taking: Reported on 09/15/2020)     No current facility-administered medications on file prior to visit.    There are no Patient Instructions on file for this visit. No  follow-ups on file.   Kris Hartmann, NP

## 2020-09-26 ENCOUNTER — Other Ambulatory Visit (INDEPENDENT_AMBULATORY_CARE_PROVIDER_SITE_OTHER): Payer: Self-pay | Admitting: Nurse Practitioner

## 2020-09-26 DIAGNOSIS — I714 Abdominal aortic aneurysm, without rupture, unspecified: Secondary | ICD-10-CM

## 2020-09-29 DIAGNOSIS — Z20822 Contact with and (suspected) exposure to covid-19: Secondary | ICD-10-CM | POA: Diagnosis not present

## 2020-10-03 ENCOUNTER — Telehealth (INDEPENDENT_AMBULATORY_CARE_PROVIDER_SITE_OTHER): Payer: Medicare HMO | Admitting: Psychiatry

## 2020-10-03 ENCOUNTER — Encounter: Payer: Self-pay | Admitting: Psychiatry

## 2020-10-03 ENCOUNTER — Other Ambulatory Visit: Payer: Self-pay

## 2020-10-03 DIAGNOSIS — G4701 Insomnia due to medical condition: Secondary | ICD-10-CM | POA: Diagnosis not present

## 2020-10-03 DIAGNOSIS — F411 Generalized anxiety disorder: Secondary | ICD-10-CM | POA: Diagnosis not present

## 2020-10-03 DIAGNOSIS — F3176 Bipolar disorder, in full remission, most recent episode depressed: Secondary | ICD-10-CM | POA: Insufficient documentation

## 2020-10-03 DIAGNOSIS — R69 Illness, unspecified: Secondary | ICD-10-CM | POA: Diagnosis not present

## 2020-10-03 MED ORDER — DULOXETINE HCL 30 MG PO CPEP: 30.0000 mg | ORAL_CAPSULE | Freq: Every day | ORAL | 1 refills | Status: DC

## 2020-10-03 MED ORDER — HYDROXYZINE PAMOATE 25 MG PO CAPS: 25.0000 mg | ORAL_CAPSULE | ORAL | 1 refills | Status: DC

## 2020-10-03 MED ORDER — HYDROXYZINE HCL 10 MG PO TABS: 10.0000 mg | ORAL_TABLET | Freq: Three times a day (TID) | ORAL | 1 refills | Status: DC | PRN

## 2020-10-03 NOTE — Patient Instructions (Signed)
Duloxetine delayed-release capsules What is this medicine? DULOXETINE (doo LOX e teen) is used to treat depression, anxiety, and different types of chronic pain. This medicine may be used for other purposes; ask your health care provider or pharmacist if you have questions. COMMON BRAND NAME(S): Cymbalta, Drizalma, Irenka What should I tell my health care provider before I take this medicine? They need to know if you have any of these conditions:  bipolar disorder  glaucoma  high blood pressure  kidney disease  liver disease  seizures  suicidal thoughts, plans or attempt; a previous suicide attempt by you or a family member  take medicines that treat or prevent blood clots  taken medicines called MAOIs like Carbex, Eldepryl, Marplan, Nardil, and Parnate within 14 days  trouble passing urine  an unusual reaction to duloxetine, other medicines, foods, dyes, or preservatives  pregnant or trying to get pregnant  breast-feeding How should I use this medicine? Take this medicine by mouth with a glass of water. Follow the directions on the prescription label. Do not crush, cut or chew some capsules of this medicine. Some capsules may be opened and sprinkled on applesauce. Check with your doctor or pharmacist if you are not sure. You can take this medicine with or without food. Take your medicine at regular intervals. Do not take your medicine more often than directed. Do not stop taking this medicine suddenly except upon the advice of your doctor. Stopping this medicine too quickly may cause serious side effects or your condition may worsen. A special MedGuide will be given to you by the pharmacist with each prescription and refill. Be sure to read this information carefully each time. Talk to your pediatrician regarding the use of this medicine in children. While this drug may be prescribed for children as young as 7 years of age for selected conditions, precautions do  apply. Overdosage: If you think you have taken too much of this medicine contact a poison control center or emergency room at once. NOTE: This medicine is only for you. Do not share this medicine with others. What if I miss a dose? If you miss a dose, take it as soon as you can. If it is almost time for your next dose, take only that dose. Do not take double or extra doses. What may interact with this medicine? Do not take this medicine with any of the following medications:  desvenlafaxine  levomilnacipran  linezolid  MAOIs like Carbex, Eldepryl, Marplan, Nardil, and Parnate  methylene blue (injected into a vein)  milnacipran  thioridazine  venlafaxine This medicine may also interact with the following medications:  alcohol  amphetamines  aspirin and aspirin-like medicines  certain antibiotics like ciprofloxacin and enoxacin  certain medicines for blood pressure, heart disease, irregular heart beat  certain medicines for depression, anxiety, or psychotic disturbances  certain medicines for migraine headache like almotriptan, eletriptan, frovatriptan, naratriptan, rizatriptan, sumatriptan, zolmitriptan  certain medicines that treat or prevent blood clots like warfarin, enoxaparin, and dalteparin  cimetidine  fentanyl  lithium  NSAIDS, medicines for pain and inflammation, like ibuprofen or naproxen  phentermine  procarbazine  rasagiline  sibutramine  St. John's wort  theophylline  tramadol  tryptophan This list may not describe all possible interactions. Give your health care provider a list of all the medicines, herbs, non-prescription drugs, or dietary supplements you use. Also tell them if you smoke, drink alcohol, or use illegal drugs. Some items may interact with your medicine. What should I watch for while   using this medicine? Tell your doctor if your symptoms do not get better or if they get worse. Visit your doctor or healthcare provider for  regular checks on your progress. Because it may take several weeks to see the full effects of this medicine, it is important to continue your treatment as prescribed by your doctor. This medicine may cause serious skin reactions. They can happen weeks to months after starting the medicine. Contact your healthcare provider right away if you notice fevers or flu-like symptoms with a rash. The rash may be red or purple and then turn into blisters or peeling of the skin. Or, you might notice a red rash with swelling of the face, lips, or lymph nodes in your neck or under your arms. Patients and their families should watch out for new or worsening thoughts of suicide or depression. Also watch out for sudden changes in feelings such as feeling anxious, agitated, panicky, irritable, hostile, aggressive, impulsive, severely restless, overly excited and hyperactive, or not being able to sleep. If this happens, especially at the beginning of treatment or after a change in dose, call your healthcare provider. You may get drowsy or dizzy. Do not drive, use machinery, or do anything that needs mental alertness until you know how this medicine affects you. Do not stand or sit up quickly, especially if you are an older patient. This reduces the risk of dizzy or fainting spells. Alcohol may interfere with the effect of this medicine. Avoid alcoholic drinks. This medicine can cause an increase in blood pressure. This medicine can also cause a sudden drop in your blood pressure, which may make you feel faint and increase the chance of a fall. These effects are most common when you first start the medicine or when the dose is increased, or during use of other medicines that can cause a sudden drop in blood pressure. Check with your doctor for instructions on monitoring your blood pressure while taking this medicine. Your mouth may get dry. Chewing sugarless gum or sucking hard candy, and drinking plenty of water, may help.  Contact your doctor if the problem does not go away or is severe. What side effects may I notice from receiving this medicine? Side effects that you should report to your doctor or health care professional as soon as possible:  allergic reactions like skin rash, itching or hives, swelling of the face, lips, or tongue  anxious  breathing problems  confusion  changes in vision  chest pain  confusion  elevated mood, decreased need for sleep, racing thoughts, impulsive behavior  eye pain  fast, irregular heartbeat  feeling faint or lightheaded, falls  feeling agitated, angry, or irritable  hallucination, loss of contact with reality  high blood pressure  loss of balance or coordination  palpitations  redness, blistering, peeling or loosening of the skin, including inside the mouth  restlessness, pacing, inability to keep still  seizures  stiff muscles  suicidal thoughts or other mood changes  trouble passing urine or change in the amount of urine  trouble sleeping  unusual bleeding or bruising  unusually weak or tired  vomiting  yellowing of the eyes or skin Side effects that usually do not require medical attention (report to your doctor or health care professional if they continue or are bothersome):  change in sex drive or performance  change in appetite or weight  constipation  dizziness  dry mouth  headache  increased sweating  nausea  tired This list may not describe   all possible side effects. Call your doctor for medical advice about side effects. You may report side effects to FDA at 1-800-FDA-1088. Where should I keep my medicine? Keep out of the reach of children. Store at room temperature between 15 and 30 degrees C (59 to 86 degrees F). Throw away any unused medicine after the expiration date. NOTE: This sheet is a summary. It may not cover all possible information. If you have questions about this medicine, talk to your doctor,  pharmacist, or health care provider.  2020 [employer]vier/Gold Standard (2019-02-05 13:47:50)  

## 2020-10-03 NOTE — Progress Notes (Signed)
Virtual Visit via Video Note  I connected with Melissa Montes on 10/03/20 at  3:00 PM EST by a video enabled telemedicine application and verified that I am speaking with the correct person using two identifiers.  Location Provider Location : [facility] Patient Location : Home  Participants: Patient , Provider   I discussed the limitations of evaluation and management by telemedicine and the availability of in person appointments. The patient expressed understanding and agreed to proceed. I discussed the assessment and treatment plan with the patient. The patient was provided an opportunity to ask questions and all were answered. The patient agreed with the plan and demonstrated an understanding of the instructions.  The patient was advised to call back or seek an in-person evaluation if the symptoms worsen or if the condition fails to improve as anticipated.   [place] MD OP Progress Note  10/03/2020 5:07 PM Tanna Loeffler  MRN:  [id number]  Chief Complaint:  Chief Complaint    Follow-up     HPI: Melissa Montes is a 64 year old Caucasian female on disability, divorced, lives in [place], has a history of bipolar disorder, GAD, coronary artery disease, abdominal aortic aneurysm, diverticulitis, chronic pain was evaluated by telemedicine today.  Patient today reports she is currently struggling with anxiety.  She reports she did not do well on the venlafaxine.  She reports it made her suicidal.  She hence stopped taking it.  She reports she has been struggling with mood symptoms the past few days.  She feels more stressed out because of upcoming procedure for her aortic aneurysm.  She wants to get it over before the holidays begin.  She reports sleep as good as long she takes the hydroxyzine.  She did not do well on the Belsomra since it gave her a headache.  She hence does not want to be on it anymore.  Patient denies any suicidality, homicidality or perceptual disturbances at this  time.  Patient denies any other concerns today.  Visit Diagnosis:    ICD-10-CM   1. GAD (generalized anxiety disorder)  F41.1 DULoxetine (CYMBALTA) 30 MG capsule    hydrOXYzine (ATARAX/VISTARIL) 10 MG tablet    DISCONTINUED: hydrOXYzine (VISTARIL) 25 MG capsule  2. Bipolar disorder, in full remission, most recent episode depressed ([place])  F31.76   3. Insomnia due to medical condition  G47.01 hydrOXYzine (ATARAX/VISTARIL) 10 MG tablet    Past Psychiatric History: I have reviewed past psychiatric history from my progress note on 04/26/2020.   Past Medical History:  Past Medical History:  Diagnosis Date  . Anxiety   . Bipolar disorder ([place])   . Depression   . Diverticulitis   . Heart disease   . Hyperlipidemia   . MI (myocardial infarction) [facility])     Past Surgical History:  Procedure Laterality Date  . ABDOMINAL HYSTERECTOMY    . CAROTID ANGIOGRAPHY N/A 01/13/2018   Procedure: CAROTID ANGIOGRAPHY;  Surgeon: Algernon Huxley, MD;  Location: [facility];  Service: Cardiovascular;  Laterality: N/A;  . CORONARY ANGIOPLASTY WITH STENT PLACEMENT    . LEFT HEART CATH Right 07/07/2018   Procedure: Left Heart Cath and Coronary Angiography;  Surgeon: Dionisio David, MD;  Location: [facility];  Service: Cardiovascular;  Laterality: Right;    Family Psychiatric History: I have reviewed family psychiatric history from my progress note on 04/26/2020  Family History:  Family History  Problem Relation Age of Onset  . Breast cancer Mother 26  . Breast cancer  Maternal Aunt        mat aunt and great aunt  . Depression Sister     Social History: I have reviewed social history from my progress note on 04/26/2020 Social History   Socioeconomic History  . Marital status: Divorced    Spouse name: Not on file  . Number of children: Not on file  . Years of education: Not on file  . Highest education level: Not on file  Occupational History  . Occupation: disabled  Tobacco Use  .  Smoking status: Former Smoker    Packs/day: 1.00    Years: 41.00    Pack years: 41.00    Types: Cigarettes    Quit date: 09/19/2018    Years since quitting: 2.0  . Smokeless tobacco: Never Used  . Tobacco comment: uses nicotine gums  Substance and Sexual Activity  . Alcohol use: No  . Drug use: No  . Sexual activity: Not on file  Other Topics Concern  . Not on file  Social History Narrative  . Not on file   Social Determinants of Health   Financial Resource Strain:   . Difficulty of Paying Living Expenses: Not on file  Food Insecurity:   . Worried About Charity fundraiser in the Last Year: Not on file  . Ran Out of Food in the Last Year: Not on file  Transportation Needs:   . Lack of Transportation (Medical): Not on file  . Lack of Transportation (Non-Medical): Not on file  Physical Activity:   . Days of Exercise per Week: Not on file  . Minutes of Exercise per Session: Not on file  Stress:   . Feeling of Stress : Not on file  Social Connections:   . Frequency of Communication with Friends and Family: Not on file  . Frequency of Social Gatherings with Friends and Family: Not on file  . Attends Religious Services: Not on file  . Active Member of Clubs or Organizations: Not on file  . Attends Archivist Meetings: Not on file  . Marital Status: Not on file    Allergies:  Allergies  Allergen Reactions  . Effexor Xr [Venlafaxine Hcl]     suicidality  . Penicillins Swelling  . Amoxicillin-Pot Clavulanate   . Keflex [Cephalexin] Hives    Metabolic Disorder Labs: No results found for: HGBA1C, MPG No results found for: PROLACTIN No results found for: CHOL, TRIG, HDL, CHOLHDL, VLDL, LDLCALC No results found for: TSH  Therapeutic Level Labs: No results found for: LITHIUM No results found for: VALPROATE No components found for:  CBMZ  Current Medications: Current Outpatient Medications  Medication Sig Dispense Refill  . aspirin EC 81 MG tablet Take 162  mg by mouth every evening.     Marland Kitchen atorvastatin (LIPITOR) 80 MG tablet Take 80 mg by mouth every evening.     . Cholecalciferol (VITAMIN D3) 1.25 MG (50000 UT) CAPS Take 1 tablet by mouth once a week.    . DULoxetine (CYMBALTA) 30 MG capsule Take 1 capsule (30 mg total) by mouth daily. 30 capsule 1  . ezetimibe (ZETIA) 10 MG tablet Take 10 mg by mouth daily.     . fluticasone (FLONASE) 50 MCG/ACT nasal spray Place 1 spray into both nostrils daily.    . hydrOXYzine (ATARAX/VISTARIL) 10 MG tablet Take 1-2 tablets (10-20 mg total) by mouth 3 (three) times daily as needed. FOR ANXIETY AND SLEEP 90 tablet 1  . metoprolol succinate (TOPROL-XL) 25 MG 24 hr  tablet Take 25 mg by mouth daily.     . Multiple Vitamin (MULTIVITAMIN) tablet Take 1 tablet by mouth daily. Centrum Silver for Women    . nitroGLYCERIN (NITROSTAT) 0.4 MG SL tablet Place 0.4 mg under the tongue every 5 (five) minutes x 3 doses as needed for chest pain.     Marland Kitchen ondansetron (ZOFRAN) 4 MG tablet Take 1 tablet by mouth 4 (four) times daily. (Patient not taking: Reported on 09/15/2020)    . promethazine (PHENERGAN) 25 MG tablet Take 25 mg by mouth every 8 (eight) hours as needed for nausea or vomiting.    . SYMBICORT 80-4.5 MCG/ACT inhaler SMARTSIG:1 Puff(s) By Mouth Every 12 Hours    . traMADol HCl 100 MG TABS Take 1.5 tablets by mouth 2 (two) times daily as needed. 90 tablet 2   No current facility-administered medications for this visit.     Musculoskeletal: Strength & Muscle Tone: UTA Gait & Station: UTA Patient leans: N/A  Psychiatric Specialty Exam: Review of Systems  Psychiatric/Behavioral: The patient is nervous/anxious.   All other systems reviewed and are negative.   There were no vitals taken for this visit.There is no height or weight on file to calculate BMI.  General Appearance: Casual  Eye Contact:  Fair  Speech:  Clear and Coherent  Volume:  Normal  Mood:  Anxious  Affect:  Congruent  Thought Process:  Goal  Directed and Descriptions of Associations: Intact  Orientation:  Full (Time, Place, and Person)  Thought Content: Logical   Suicidal Thoughts:  No  Homicidal Thoughts:  No  Memory:  Immediate;   Fair Recent;   Fair Remote;   Fair  Judgement:  Fair  Insight:  Fair  Psychomotor Activity:  Normal  Concentration:  Concentration: Fair and Attention Span: Fair  Recall:  [employer] of Knowledge: Fair  Language: Fair  Akathisia:  No  Handed:  Right  AIMS (if indicated): UTA  Assets:  Communication Skills Desire for Improvement Housing Social Support  ADL's:  Intact  Cognition: WNL  Sleep:  Fair   Screenings: PHQ2-9     Office Visit from 06/28/2020 in [place] Procedure visit from 04/11/2020 in [place] Procedure visit from 03/14/2020 in [facility] from 11/20/2018 in [facility]  PHQ-2 Total Score 0 0 0 4  PHQ-9 Total Score -- -- -- 13       Assessment and Plan: Markeita Alicia is a 64 year old Caucasian female, disabled, divorced, lives in [place], has a history of bipolar disorder, GAD, aortic aneurysm, diverticulitis, MI status post stent placement, chronic pain was evaluated by telemedicine today.  Patient is currently struggling with anxiety, does have situational stressors of upcoming procedure for aortic aneurysm.  She did not tolerate venlafaxine well and would like to retry Cymbalta.  Discussed plan as noted below.  Plan GAD-unstable Restart Cymbalta 30 mg p.o. daily Discontinue venlafaxine for side effects. Continue hydroxyzine 25 mg p.o. daily as needed for severe anxiety and sleep.   Bipolar disorder in remission Will monitor closely.  Insomnia-improving Hydroxyzine at bedtime as needed for sleep. Discontinue Belsomra.  Follow-up in clinic in 4 weeks or sooner if needed.  I have spent atleast  20 minutes face to face by video with patient today. More than 50 % of the time was spent for preparing to see the patient ( e.g., review of test, records ),  ordering medications and test ,psychoeducation and supportive psychotherapy and care coordination,as well as documenting clinical information in electronic health record. This note was generated in part or whole with voice recognition software. Voice recognition is usually quite accurate but there are transcription errors that can and very often do occur. I apologize for any typographical errors that were not detected and corrected.        Ursula Alert, MD 10/03/2020, 5:07 PM

## 2020-10-04 ENCOUNTER — Ambulatory Visit: Admission: RE | Admit: 2020-10-04 | Payer: Medicare HMO | Source: Ambulatory Visit

## 2020-10-05 ENCOUNTER — Telehealth: Payer: Self-pay

## 2020-10-05 DIAGNOSIS — F411 Generalized anxiety disorder: Secondary | ICD-10-CM

## 2020-10-05 MED ORDER — HYDROXYZINE PAMOATE 25 MG PO CAPS: 25.0000 mg | ORAL_CAPSULE | ORAL | 1 refills | Status: DC

## 2020-10-05 NOTE — Telephone Encounter (Signed)
pt called left a message that she was taking the hydroxyzine 25mg  two but the rx from she just picked up was for hydroxyzine 10mg  .  she wants to know why it was changed and whick one she suppose to take.

## 2020-10-05 NOTE — Telephone Encounter (Signed)
I have sent the hydroxyzine to pharmacy with the higher dosage of 25 mg.

## 2020-10-06 NOTE — Telephone Encounter (Signed)
pt was called and given message and told rx was sent in yesterday for the hydrozyine 25mg 

## 2020-10-12 ENCOUNTER — Ambulatory Visit: Admission: RE | Admit: 2020-10-12 | Payer: Medicare HMO | Source: Ambulatory Visit

## 2020-10-25 ENCOUNTER — Other Ambulatory Visit: Payer: Self-pay

## 2020-10-25 ENCOUNTER — Ambulatory Visit
Admission: RE | Admit: 2020-10-25 | Discharge: 2020-10-25 | Disposition: A | Discharge status: Home / Self Care | Payer: Medicare HMO | Source: Ambulatory Visit | Attending: Nurse Practitioner | Admitting: Nurse Practitioner

## 2020-10-25 DIAGNOSIS — K8020 Calculus of gallbladder without cholecystitis without obstruction: Secondary | ICD-10-CM | POA: Diagnosis not present

## 2020-10-25 DIAGNOSIS — I714 Abdominal aortic aneurysm, without rupture, unspecified: Secondary | ICD-10-CM

## 2020-10-25 DIAGNOSIS — Z01818 Encounter for other preprocedural examination: Secondary | ICD-10-CM | POA: Diagnosis not present

## 2020-10-25 HISTORY — DX: Essential (primary) hypertension: I10

## 2020-10-25 LAB — POCT I-STAT CREATININE: Creatinine, Ser: 0.8 mg/dL (ref 0.44–1.00)

## 2020-10-25 MED ORDER — IOHEXOL 350 MG/ML SOLN
75.0000 mL | Freq: Once | INTRAVENOUS | Status: AC | PRN
  Administered 2020-10-25: 75 mL via INTRAVENOUS

## 2020-10-25 NOTE — Progress Notes (Signed)
Let's get her in to review CT with [facility]

## 2020-10-28 ENCOUNTER — Other Ambulatory Visit: Payer: Self-pay

## 2020-10-28 ENCOUNTER — Ambulatory Visit (INDEPENDENT_AMBULATORY_CARE_PROVIDER_SITE_OTHER): Payer: Medicare HMO | Admitting: Vascular Surgery

## 2020-10-28 ENCOUNTER — Encounter (INDEPENDENT_AMBULATORY_CARE_PROVIDER_SITE_OTHER): Payer: Self-pay | Admitting: Vascular Surgery

## 2020-10-28 VITALS — BP 129/72 | HR 89 | Resp 16 | Wt 100.4 lb

## 2020-10-28 DIAGNOSIS — I6523 Occlusion and stenosis of bilateral carotid arteries: Secondary | ICD-10-CM

## 2020-10-28 DIAGNOSIS — E7800 Pure hypercholesterolemia, unspecified: Secondary | ICD-10-CM | POA: Diagnosis not present

## 2020-10-28 DIAGNOSIS — I714 Abdominal aortic aneurysm, without rupture, unspecified: Secondary | ICD-10-CM

## 2020-10-28 DIAGNOSIS — I10 Essential (primary) hypertension: Secondary | ICD-10-CM | POA: Diagnosis not present

## 2020-10-28 NOTE — Progress Notes (Signed)
MRN : [id number]  Melissa Montes is a 64 y.o. (03/20/1956) female who presents with chief complaint of  Chief Complaint  Patient presents with  . Follow-up    Ct results  .  History of Present Illness: Patient returns today in follow up of her abdominal aortic aneurysm.  She does not have any current back or abdominal pain or signs of peripheral embolization.  She is very anxious about this aneurysm.  It has shown growth over time.  She has undergone a CT angiogram since her last visit. I have independently reviewed the patient's CT angiogram of the abdomen and pelvis.  The official report is of a 4.4 cm infrarenal abdominal aortic aneurysm.  I would interpret this differently.  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  On image 85 of 206 in the axial scan, this clearly measures approximately 4.7 cm in transverse diameter.  This would be the largest diameter of the aneurysm.  This would also represent an approximately 7 mm growth in the last 9-10 months as it was measured at 4 cm earlier this year.  Current Outpatient Medications  Medication Sig Dispense Refill  . aspirin EC 81 MG tablet Take 162 mg by mouth every evening.     Marland Kitchen atorvastatin (LIPITOR) 80 MG tablet Take 80 mg by mouth every evening.     . Cholecalciferol (VITAMIN D3) 1.25 MG (50000 UT) CAPS Take 1 tablet by mouth once a week.    . DULoxetine (CYMBALTA) 30 MG capsule Take 1 capsule (30 mg total) by mouth daily. 30 capsule 1  . ezetimibe (ZETIA) 10 MG tablet Take 10 mg by mouth daily.     . fluticasone (FLONASE) 50 MCG/ACT nasal spray Place 1 spray into both nostrils as needed.    . hydrOXYzine (VISTARIL) 25 MG capsule Take 1 capsule (25 mg total) by mouth as directed. Take 1 capsule daily as needed for anxiety and 2 capsules at bedtime as needed for sleep 90 capsule 1  . metoprolol  succinate (TOPROL-XL) 25 MG 24 hr tablet Take 25 mg by mouth daily.     . Multiple Vitamin (MULTIVITAMIN) tablet Take 1 tablet by mouth daily. Centrum Silver for Women    . nitroGLYCERIN (NITROSTAT) 0.4 MG SL tablet Place 0.4 mg under the tongue every 5 (five) minutes x 3 doses as needed for chest pain.     Marland Kitchen ondansetron (ZOFRAN) 4 MG tablet Take 1 tablet by mouth 4 (four) times daily.    . promethazine (PHENERGAN) 25 MG tablet Take 25 mg by mouth every 8 (eight) hours as needed for nausea or vomiting.    . traMADol HCl 100 MG TABS Take 1.5 tablets by mouth 2 (two) times daily as needed. 90 tablet 2  . SYMBICORT 80-4.5 MCG/ACT inhaler SMARTSIG:1 Puff(s) By Mouth Every 12 Hours     No current facility-administered medications for this visit.    Past Medical History:  Diagnosis Date  . Anxiety   . Bipolar disorder ([place])   . Depression   . Diverticulitis   . Heart disease   . Hyperlipidemia   . Hypertension   . MI (myocardial infarction) [facility])     Past Surgical History:  Procedure Laterality Date  . ABDOMINAL HYSTERECTOMY    . CAROTID ANGIOGRAPHY N/A 01/13/2018   Procedure: CAROTID ANGIOGRAPHY;  Surgeon: Algernon Huxley, MD;  Location: [facility]  INVASIVE [facility];  Service: Cardiovascular;  Laterality: N/A;  . CORONARY ANGIOPLASTY WITH STENT PLACEMENT    . LEFT HEART CATH Right 07/07/2018   Procedure: Left Heart Cath and Coronary Angiography;  Surgeon: Dionisio David, MD;  Location: [facility];  Service: Cardiovascular;  Laterality: Right;     Social History   Tobacco Use  . Smoking status: Former Smoker    Packs/day: 1.00    Years: 41.00    Pack years: 41.00    Types: Cigarettes    Quit date: 09/19/2018    Years since quitting: 2.1  . Smokeless tobacco: Never Used  . Tobacco comment: uses nicotine gums  Substance Use Topics  . Alcohol use: No  . Drug use: No     Family History  Problem Relation Age of Onset  . Breast cancer Mother 34  . Breast cancer Maternal Aunt         mat aunt and great aunt  . Depression Sister   no bleeding or clotting disorders  Allergies  Allergen Reactions  . Effexor Xr [Venlafaxine Hcl]     suicidality  . Penicillins Swelling  . Amoxicillin-Pot Clavulanate   . Keflex [Cephalexin] Hives    REVIEW OF SYSTEMS(Negative unless checked)  Constitutional: [] ?????Weight loss[] ?????Fever[] ?????Chills Cardiac:[] ?????Chest pain[] ?????Chest pressure[x] ?????Palpitations [] ?????Shortness of breath when laying flat [] ?????Shortness of breath at rest [x] ?????Shortness of breath with exertion. Vascular: [] ?????Pain in legs with walking[] ?????Pain in legsat rest[] ?????Pain in legs when laying flat [x] ?????Claudication [] ?????Pain in feet when walking [] ?????Pain in feet at rest [] ?????Pain in feet when laying flat [] ?????History of DVT [] ?????Phlebitis [] ?????Swelling in legs [] ?????Varicose veins [] ?????Non-healing ulcers Pulmonary: [] ?????Uses home oxygen [] ?????Productive cough[] ?????Hemoptysis [] ?????Wheeze [] ?????COPD [] ?????Asthma Neurologic: [x] ?????Dizziness [] ?????Blackouts [] ?????Seizures [] ?????History of stroke [] ?????History of TIA[] ?????Aphasia [] ?????Temporary blindness[] ?????Dysphagia [] ?????Weaknessor numbness in arms [] ?????Weakness or numbnessin legs Musculoskeletal: [] ?????Arthritis [] ?????Joint swelling [] ?????Joint pain [] ?????Low back pain Hematologic:[] ?????Easy bruising[] ?????Easy bleeding [] ?????Hypercoagulable state [] ?????Anemic  Gastrointestinal:[] ?????Blood in stool[] ?????Vomiting blood[x] ?????Gastroesophageal reflux/heartburn[] ?????Abdominal pain Genitourinary: [] ?????Chronic kidney disease [] ?????Difficulturination [] ?????Frequenturination [] ?????Burning with urination[] ?????Hematuria Skin: [] ?????Rashes [] ?????Ulcers [] ?????Wounds Psychological: [x] ?????History of anxiety[x] ?????History of  major depression.  Physical Examination  BP 129/72 (BP Location: Right Arm)   Pulse 89   Resp 16   Wt 100 lb 6.4 oz (45.5 kg)   BMI 18.97 kg/m  Gen:  Thin, NAD Head: [place]/AT, No temporalis wasting. Ear/Nose/Throat: Hearing grossly intact, nares w/o erythema or drainage Eyes: Conjunctiva clear. Sclera non-icteric Neck: Supple.  Trachea midline Pulmonary:  Good air movement, no use of accessory muscles.  Cardiac: RRR, no JVD Vascular:  Vessel Right Left  Radial Palpable Palpable                          PT Palpable Palpable  DP Palpable Palpable   Gastrointestinal: soft, non-tender/non-distended. No guarding/reflex. Increased aortic impulse Musculoskeletal: M/S 5/5 throughout.  No deformity or atrophy. No edema. Neurologic: Sensation grossly intact in extremities.  Symmetrical.  Speech is fluent.  Psychiatric: Judgment intact, Mood & affect appropriate for pt's clinical situation. Dermatologic: No rashes or ulcers noted.  No cellulitis or open wounds.       Labs Recent Results (from the past 2160 hour(s))  Drug Screen 10 W/Conf, Serum     Status: None   Collection Time: 08/29/20  3:50 PM  Result Value Ref Range   Amphetamines, IA Negative Cutoff:50 ng/mL   Barbiturates, IA Negative Cutoff:0.1 ug/mL   Benzodiazepines, IA Negative Cutoff:20 ng/mL   Cocaine & Metabolite, IA Negative Cutoff:25 ng/mL   Phencyclidine, IA Negative  Cutoff:8 ng/mL   THC(Marijuana) Metabolite, IA Negative Cutoff:5 ng/mL   Opiates, IA Negative Cutoff:5 ng/mL   Oxycodones, IA Negative Cutoff:5 ng/mL   Methadone, IA Negative Cutoff:25 ng/mL   Propoxyphene, IA Negative Cutoff:50 ng/mL    Comment: This test was developed and its performance characteristics determined by [facility].  It has not been cleared or approved by the Food and Drug Administration.   I-STAT creatinine     Status: None   Collection Time: 10/25/20  2:39 PM  Result Value Ref Range   Creatinine, Ser 0.80 0.44 - 1.00 mg/dL     Radiology CT Angio Abd/Pel w/ and/or w/o  Result Date: 10/25/2020 CLINICAL DATA:  Aortic aneurysm, preop planning EXAM: CTA ABDOMEN AND PELVIS WITH CONTRAST TECHNIQUE: Multidetector CT imaging of the abdomen and pelvis was performed using the standard protocol during bolus administration of intravenous contrast. Multiplanar reconstructed images and MIPs were obtained and reviewed to evaluate the vascular anatomy. CONTRAST:  54mL OMNIPAQUE IOHEXOL 350 MG/ML SOLN COMPARISON:  01/30/2012 FINDINGS: VASCULAR Aorta: Nonocclusive eccentric mural thrombus throughout the visualized distal descending thoracic and abdominal aorta. No dissection or stenosis. Moderate calcified atheromatous plaque. Fusiform infrarenal aneurysm 4.4x3.8 cm maximum transverse dimensions (previously 2.5 cm in 2013) extending to the bifurcation, containing eccentric nonocclusive mural thrombus. No leak or suggestion of impending rupture. Celiac: Calcified ostial plaque without high-grade stenosis, patent distally. SMA: Patent without evidence of aneurysm, dissection, vasculitis or significant stenosis. Renals: Both renal arteries are patent without evidence of aneurysm, dissection, vasculitis, fibromuscular dysplasia or significant stenosis. IMA: Origin occlusion, reconstituted distally by visceral collaterals. Inflow: On the right, partially calcified plaque at the origin of the common iliac resulting in at least mild stenosis over length of approximately 1.7 cm. Calcified plaque in internal and external iliac arteries which remain patent. On the left, eccentric calcified plaque in the proximal common iliac resulting in mild stenosis. Scattered calcified plaque in external and internal iliac arteries which remain patent. Proximal Outflow: Mildly atheromatous but patent. Veins: Patent hepatic veins, portal vein, SMV, splenic vein, bilateral renal veins, IVC. Iliac venous system unremarkable. No venous pathology evident. Review of the MIP  images confirms the above findings. NON-VASCULAR Lower chest: No pleural or pericardial effusion. Emphysematous changes in the lung bases. Hepatobiliary: Subcentimeter partially calcified stones in the dependent aspect of the nondilated gallbladder. No biliary ductal dilatation.1.1 cm probable cyst in hepatic segment 8. Pancreas: Unremarkable. No pancreatic ductal dilatation or surrounding inflammatory changes. Spleen: Normal in size without focal abnormality. Adrenals/Urinary Tract: Adrenal glands are unremarkable. Kidneys are normal, without renal calculi, focal lesion, or hydronephrosis. Bladder is unremarkable. Stomach/Bowel: Stomach is nondistended. Small bowel decompressed. Appendix not discretely identified. The colon is nondilated, unremarkable. Lymphatic: No abdominal or pelvic adenopathy. Reproductive: Status post hysterectomy. No adnexal masses. Other: No ascites.  No free air. Musculoskeletal: No acute or significant osseous findings. IMPRESSION: 1. 4.4 cm infrarenal abdominal aortic aneurysm. Recommend follow-up every 12 months and vascular consultation. This recommendation follows ACR consensus guidelines: White Paper of the ACR Incidental Findings Committee II on Vascular Findings. J Am Coll Radiol 2013; 10:789-794. 2. Cholelithiasis. Aortic Atherosclerosis (ICD10-I70.0) and Emphysema (ICD10-J43.9). Electronically Signed   By: Lucrezia Europe M.D.   On: 10/25/2020 15:46    Assessment/Plan Pure hypercholesterolemia lipid control important in reducing the progression of atherosclerotic disease. Continue statin therapy   Benign essential hypertension blood pressure control important in reducing the progression of atherosclerotic disease. On appropriate oral medications.   Bilateral carotid artery stenosis Mild at last  check, checking annually  AAA (abdominal aortic aneurysm) without rupture ([place]) I have independently reviewed the patient's CT angiogram of the abdomen and pelvis.  The  official report is of a 4.4 cm infrarenal abdominal aortic aneurysm.  I would interpret this differently.  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  On image 85 of 206 in the axial scan, this clearly measures approximately 4.7 cm in transverse diameter.  This would be the largest diameter of the aneurysm.  This would also represent about a 7 mm growth in the last 9-10 months.  In addition, she is a very small woman and the normal size of her aorta at the level of the renals is only in the range of 17 to 18 mm.  An aneurysm of this size on her, carries with it a more significant risk than on a larger individual.  As such, I think proceeding with repair is reasonable.  She is very desirous to proceed with repair and is very anxious about this aneurysm.  The risks and the benefits of the endovascular aneurysm repair were discussed with the patient in detail.  She is agreeable to proceed.    Leotis Pain, MD  10/28/2020 10:46 AM    This note was created with Dragon medical transcription system.  Any errors from dictation are purely unintentional

## 2020-10-28 NOTE — Assessment & Plan Note (Addendum)
I have independently reviewed the patient's CT angiogram of the abdomen and pelvis.  The official report is of a 4.4 cm infrarenal abdominal aortic aneurysm.  I would interpret this differently.  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  On image 85 of 206 in the axial scan, this clearly measures approximately 4.7 cm in transverse diameter.  This would be the largest diameter of the aneurysm.  This would also represent about a 7 mm growth in the last 9-10 months.  In addition, she is a very small woman and the normal size of her aorta at the level of the renals is only in the range of 17 to 18 mm.  An aneurysm of this size on her, carries with it a more significant risk than on a larger individual.  As such, I think proceeding with repair is reasonable.  She is very desirous to proceed with repair and is very anxious about this aneurysm.  The risks and the benefits of the endovascular aneurysm repair were discussed with the patient in detail.  She is agreeable to proceed.

## 2020-10-28 NOTE — Patient Instructions (Signed)
Abdominal Aortic Aneurysm Endograft Repair  Abdominal aortic aneurysm endograft repair is a surgery to fix an aortic aneurysm in the abdominal area. An aneurysm is a weak or damaged part of an artery wall that bulges out from the normal force of blood pumping through the body. An abdominal aortic aneurysm is an aneurysm that happens in the lower part of the aorta, which is the main artery of the body. The repair is often done if the aneurysm gets so large that it might burst (rupture). A ruptured aneurysm would cause bleeding inside the body that could put a person's life in danger. Before that happens, this procedure is needed to fix the problem. The procedure may also be done if the aneurysm causes symptoms such as pain in the back, abdomen, or side. In this procedure, a tube made of fabric and metal mesh (endograft or stent-graft) is placed in the weak part of the aorta to repair it. Tell a health care provider about:  Any allergies you have.  All medicines you are taking, including vitamins, herbs, eye drops, creams, and over-the-counter medicines.  Any problems you or family members have had with anesthetic medicines.  Any blood disorders you have.  Any surgeries you have had.  Any medical conditions you have.  Whether you are pregnant or may be pregnant. What are the risks? Generally, this is a safe procedure. However, problems may occur, including:  Infection of the graft or incision area.  Bleeding during the procedure or from the incision site.  Allergic reactions to medicines.  Damage to other structures or organs.  Blood leaking out around the endograft.  The endograft moving from where it was placed during surgery.  Blood flow through the graft becoming blocked.  Blood clots.  Kidney problems.  Blood flow to the legs becoming blocked (rare).  Rupture of the aorta even after the endograft repair is a success (rare). What happens before the procedure? Staying  hydrated Follow instructions from your health care provider about hydration, which may include:  Up to 2 hours before the procedure - you may continue to drink clear liquids, such as water, clear fruit juice, black coffee, and plain tea. Eating and drinking restrictions Follow instructions from your health care provider about eating and drinking, which may include:  8 hours before the procedure - stop eating heavy meals or foods such as meat, fried foods, or fatty foods.  6 hours before the procedure - stop eating light meals or foods, such as toast or cereal.  6 hours before the procedure - stop drinking milk or drinks that contain milk.  2 hours before the procedure - stop drinking clear liquids. Medicines  Ask your health care provider about: ? Changing or stopping your regular medicines. This is especially important if you are taking diabetes medicines or blood thinners. ? Taking medicines such as aspirin and ibuprofen. These medicines can thin your blood. Do not take these medicines before your procedure if your health care provider instructs you not to.  You may be given antibiotic medicine to help prevent infection. General instructions  You may need to have blood tests, a test to check heart rhythm (electrocardiogram, or ECG), or a test to check blood flow (angiogram) before the surgery.  Imaging tests will be done to check the size and location of the aneurysm. These tests could include an ultrasound, a CT scan, or an MRI.  Do not use any products that contain nicotine or tobacco--such as cigarettes and e-cigarettes--for as   long as possible before the surgery. If you need help quitting, ask your health care provider.  Ask your health care provider how your surgical site will be marked or identified.  Plan to have someone take you home from the hospital or clinic. What happens during the procedure?  To reduce your risk of infection: ? Your health care team will wash or  sanitize their hands. ? Your skin will be washed with soap. ? Hair may be removed from the surgical area.  An IV tube will be inserted into one of your veins.  You will be given one or more of the following: ? A medicine to help you relax (sedative). ? A medicine to numb the area (local anesthetic). ? A medicine to make you fall asleep (general anesthetic). ? A medicine that is injected into an area of your body to numb everything below the injection site (regional anesthetic).  During the surgery: ? Small incisions or a puncture will be made on one or both sides of the groin. Long, thin tubes (catheters) will be passed through the opening, put into the artery in your thigh, and moved up into the aneurysm in the aorta. ? The health care provider will use live X-ray pictures to guide the endograft through the catheterto the place where the aneurysm is. ? The endograft will be released to seal off the aneurysm and to line the aorta. It will keep blood from flowing into the aneurysm and will help keep it from rupturing. The endograft will stay in place and will not be taken out. ? X-rays will be used to check where the endograft is placed and to make sure that it is where it should be. ? The catheter will be taken out, and the incision will be closed with stitches (sutures). The procedure may vary among health care providers and hospitals. What happens after the procedure?  Your blood pressure, heart rate, breathing rate, and blood oxygen level will be monitored until the medicines you were given have worn off.  You will need to lie flat for a number of hours. Bending your legs can cause them to bleed and swell.  You will then be urged to get up and move around a number of times each day and to slowly become more active.  You will be given medicines to control pain.  Certain tests may be done after your procedure to check how well the endograft is working and to check its placement.  Do  not drive for 24 hours if you received a sedative. This information is not intended to replace advice given to you by your health care provider. Make sure you discuss any questions you have with your health care provider. Document Revised: 10/18/2017 Document Reviewed: 01/30/2016 Elsevier Patient Education  2020 [employer]vier Inc.  

## 2020-10-31 ENCOUNTER — Telehealth (INDEPENDENT_AMBULATORY_CARE_PROVIDER_SITE_OTHER): Payer: Self-pay

## 2020-10-31 NOTE — Telephone Encounter (Signed)
I attempted to contact the patient and a message was left for a return call. 

## 2020-11-02 ENCOUNTER — Other Ambulatory Visit: Payer: Self-pay

## 2020-11-02 ENCOUNTER — Telehealth (INDEPENDENT_AMBULATORY_CARE_PROVIDER_SITE_OTHER): Payer: Medicare HMO | Admitting: Psychiatry

## 2020-11-02 ENCOUNTER — Encounter: Payer: Self-pay | Admitting: Psychiatry

## 2020-11-02 DIAGNOSIS — F3176 Bipolar disorder, in full remission, most recent episode depressed: Secondary | ICD-10-CM

## 2020-11-02 DIAGNOSIS — R69 Illness, unspecified: Secondary | ICD-10-CM | POA: Diagnosis not present

## 2020-11-02 DIAGNOSIS — F411 Generalized anxiety disorder: Secondary | ICD-10-CM | POA: Diagnosis not present

## 2020-11-02 DIAGNOSIS — G4701 Insomnia due to medical condition: Secondary | ICD-10-CM | POA: Diagnosis not present

## 2020-11-02 MED ORDER — GABAPENTIN 100 MG PO CAPS: 100.0000 mg | ORAL_CAPSULE | Freq: Two times a day (BID) | ORAL | 1 refills | Status: DC

## 2020-11-02 NOTE — Patient Instructions (Signed)

## 2020-11-02 NOTE — Progress Notes (Signed)
Virtual Visit via Video Note  I connected with Melissa Montes on 11/02/20 at  4:00 PM EST by a video enabled telemedicine application and verified that I am speaking with the correct person using two identifiers.  Location Provider Location : [facility] Patient Location : Home  Participants: Patient , Provider   I discussed the limitations of evaluation and management by telemedicine and the availability of in person appointments. The patient expressed understanding and agreed to proceed.    I discussed the assessment and treatment plan with the patient. The patient was provided an opportunity to ask questions and all were answered. The patient agreed with the plan and demonstrated an understanding of the instructions.   The patient was advised to call back or seek an in-person evaluation if the symptoms worsen or if the condition fails to improve as anticipated.   [facility] MD OP Progress Note  11/02/2020 4:17 PM Melissa Montes  MRN:  [id number]  Chief Complaint:  Chief Complaint    Follow-up     HPI: Melissa Montes is a 64 year old Caucasian female on disability, divorced, lives in [place], has a history of bipolar disorder, GAD, coronary artery disease, abdominal aortic aneurysm, diverticulitis, chronic pain was evaluated by telemedicine today.  Patient today reports she continues to struggle with some anxiety on and off.  She reports her anxiety is mostly about her health in general.  She reports she was supposed to get a procedure done for aortic aneurysm however that was rescheduled to December 29.  She needs a cardiology clearance before that.  She reports that duloxetine does help with her anxiety to some extent.  She denies any side effects.  She currently denies any sadness or crying spells.  She reports the last time she had a manic episode was probably 7 to 10 years ago.  Patient denies any suicidality, homicidality or perceptual disturbances.  She reports she is  sleeping well and gets around 7 hours of sleep at this time.  Patient denies any other concerns today.  Visit Diagnosis:    ICD-10-CM   1. GAD (generalized anxiety disorder)  F41.1 gabapentin (NEURONTIN) 100 MG capsule  2. Bipolar disorder, in full remission, most recent episode depressed (HCC)  F31.76 gabapentin (NEURONTIN) 100 MG capsule  3. Insomnia due to medical condition  G47.01    pain    Past Psychiatric History: I have reviewed past psychiatric history from my progress note on 04/26/2020  Past Medical History:  Past Medical History:  Diagnosis Date  . Anxiety   . Bipolar disorder ([place])   . Depression   . Diverticulitis   . Heart disease   . Hyperlipidemia   . Hypertension   . MI (myocardial infarction) [facility])     Past Surgical History:  Procedure Laterality Date  . ABDOMINAL HYSTERECTOMY    . CAROTID ANGIOGRAPHY N/A 01/13/2018   Procedure: CAROTID ANGIOGRAPHY;  Surgeon: Algernon Huxley, MD;  Location: [facility];  Service: Cardiovascular;  Laterality: N/A;  . CORONARY ANGIOPLASTY WITH STENT PLACEMENT    . LEFT HEART CATH Right 07/07/2018   Procedure: Left Heart Cath and Coronary Angiography;  Surgeon: Dionisio David, MD;  Location: [facility];  Service: Cardiovascular;  Laterality: Right;    Family Psychiatric History: I have reviewed family psychiatric history from my progress note on 04/26/2020  Family History:  Family History  Problem Relation Age of Onset  . Breast cancer Mother 7  . Breast cancer Maternal Aunt  mat aunt and great aunt  . Depression Sister     Social History: Reviewed social history from my progress note on 04/26/2020 Social History   Socioeconomic History  . Marital status: Divorced    Spouse name: Not on file  . Number of children: Not on file  . Years of education: Not on file  . Highest education level: Not on file  Occupational History  . Occupation: disabled  Tobacco Use  . Smoking status: Former Smoker     Packs/day: 1.00    Years: 41.00    Pack years: 41.00    Types: Cigarettes    Quit date: 09/19/2018    Years since quitting: 2.1  . Smokeless tobacco: Never Used  . Tobacco comment: uses nicotine gums  Substance and Sexual Activity  . Alcohol use: No  . Drug use: No  . Sexual activity: Not on file  Other Topics Concern  . Not on file  Social History Narrative  . Not on file   Social Determinants of Health   Financial Resource Strain: Not on file  Food Insecurity: Not on file  Transportation Needs: Not on file  Physical Activity: Not on file  Stress: Not on file  Social Connections: Not on file    Allergies:  Allergies  Allergen Reactions  . Effexor Xr [Venlafaxine Hcl]     suicidality  . Penicillins Swelling  . Amoxicillin-Pot Clavulanate   . Keflex [Cephalexin] Hives    Metabolic Disorder Labs: No results found for: HGBA1C, MPG No results found for: PROLACTIN No results found for: CHOL, TRIG, HDL, CHOLHDL, VLDL, LDLCALC No results found for: TSH  Therapeutic Level Labs: No results found for: LITHIUM No results found for: VALPROATE No components found for:  CBMZ  Current Medications: Current Outpatient Medications  Medication Sig Dispense Refill  . aspirin EC 81 MG tablet Take 162 mg by mouth every evening.     Marland Kitchen atorvastatin (LIPITOR) 80 MG tablet Take 80 mg by mouth every evening.     . Cholecalciferol (VITAMIN D3) 1.25 MG (50000 UT) CAPS Take 1 tablet by mouth once a week.    . DULoxetine (CYMBALTA) 30 MG capsule Take 1 capsule (30 mg total) by mouth daily. 30 capsule 1  . ezetimibe (ZETIA) 10 MG tablet Take 10 mg by mouth daily.     . fluticasone (FLONASE) 50 MCG/ACT nasal spray Place 1 spray into both nostrils as needed.    . gabapentin (NEURONTIN) 100 MG capsule Take 1 capsule (100 mg total) by mouth 2 (two) times daily. Mood 60 capsule 1  . hydrOXYzine (VISTARIL) 25 MG capsule Take 1 capsule (25 mg total) by mouth as directed. Take 1 capsule daily as  needed for anxiety and 2 capsules at bedtime as needed for sleep 90 capsule 1  . metoprolol succinate (TOPROL-XL) 25 MG 24 hr tablet Take 25 mg by mouth daily.     . Multiple Vitamin (MULTIVITAMIN) tablet Take 1 tablet by mouth daily. Centrum Silver for Women    . nitroGLYCERIN (NITROSTAT) 0.4 MG SL tablet Place 0.4 mg under the tongue every 5 (five) minutes x 3 doses as needed for chest pain.     Marland Kitchen ondansetron (ZOFRAN) 4 MG tablet Take 1 tablet by mouth 4 (four) times daily.    . promethazine (PHENERGAN) 25 MG tablet Take 25 mg by mouth every 8 (eight) hours as needed for nausea or vomiting.    . SYMBICORT 80-4.5 MCG/ACT inhaler SMARTSIG:1 Puff(s) By Mouth Every 12 Hours    .  traMADol HCl 100 MG TABS Take 1.5 tablets by mouth 2 (two) times daily as needed. 90 tablet 2   No current facility-administered medications for this visit.     Musculoskeletal: Strength & Muscle Tone: UTA Gait & Station: normal Patient leans: N/A  Psychiatric Specialty Exam: Review of Systems  Musculoskeletal: Positive for back pain.       Hip and back pain  Psychiatric/Behavioral: The patient is nervous/anxious.   All other systems reviewed and are negative.   There were no vitals taken for this visit.There is no height or weight on file to calculate BMI.  General Appearance: Casual  Eye Contact:  Fair  Speech:  Normal Rate  Volume:  Normal  Mood:  Anxious  Affect:  Appropriate  Thought Process:  Goal Directed and Descriptions of Associations: Intact  Orientation:  Full (Time, Place, and Person)  Thought Content: Logical   Suicidal Thoughts:  No  Homicidal Thoughts:  No  Memory:  Immediate;   Fair Recent;   Fair Remote;   Fair  Judgement:  Fair  Insight:  Fair  Psychomotor Activity:  Normal  Concentration:  Concentration: Fair and Attention Span: Fair  Recall:  [employer] of Knowledge: Fair  Language: Fair  Akathisia:  No  Handed:  Right  AIMS (if indicated): UTA  Assets:  Communication  Skills Desire for Improvement Housing Social Support  ADL's:  Intact  Cognition: WNL  Sleep:  Improving   Screenings: PHQ2-9   [place] Office Visit from 06/28/2020 in [facility] Procedure visit from 04/11/2020 in [facility] Procedure visit from 03/14/2020 in [facility] from [facility]/20/2018 in [facility]  PHQ-2 Total Score 0 0 0 4  PHQ-9 Total Score -- -- -- 13       Assessment and Plan: Melissa Montes is a 64 year old Caucasian female, disabled, divorced, lives in [place], has a history of bipolar disorder, GAD, aortic aneurysm, diverticulitis, MI status post stent placement, chronic pain was evaluated by telemedicine today.  Patient is currently making progress with regards to her anxiety however continues to struggle with episodic anxiety.  Discussed plan as noted below.  Plan GAD-some progress Cymbalta 30 mg p.o. daily Start gabapentin 100 mg p.o. twice daily. Continue hydroxyzine 25 mg p.o. daily as needed for severe anxiety and sleep  Bipolar disorder in remission Will monitor closely  Insomnia-improving Hydroxyzine 25 mg as needed for sleep.  Follow-up in clinic in 1 month or sooner if needed.  I have spent atleast 20 minutes face to face by video with patient today. More than 50 % of the time was spent for preparing to see the patient ( e.g., review of test, records ),ordering medications and test ,psychoeducation and supportive psychotherapy and care coordination,as well as documenting clinical information in electronic health record. This note was generated in part or whole with voice recognition software. Voice recognition is usually quite accurate but there are transcription errors that can and very often do occur. I apologize for any typographical errors that were not detected and  corrected.        Ursula Alert, MD 11/03/2020, 9:34 AM

## 2020-11-03 ENCOUNTER — Telehealth (INDEPENDENT_AMBULATORY_CARE_PROVIDER_SITE_OTHER): Payer: Self-pay

## 2020-11-03 NOTE — Telephone Encounter (Signed)
Spoke with the patient and she is now scheduled with Dr. Lucky Cowboy for a Endovascular AAA stent graft repair on 11/16/20 with a 6:15 am arrival time to the [facility]. Phone call pre-op is scheduled for 11/09/20 between 8-1 pm and covid testing on 11/14/20 between 8-1 pm at the [facility]. Pre-surgical instructions were discussed and will be mailed.

## 2020-11-04 NOTE — Telephone Encounter (Signed)
Patient called back an hour later and requested to be moved to after the new year. I advised that my earliest appt will be 12/14/20 and the patient wanted the first available after the new year and again I advised that it would be 12/14/20. Patient stated she wanted to have her surgery moved to 12/14/20. The patient has been moved to 12/14/20 with a 6:15 am to the MM. Phone call pre-op on 12/05/20 between 1-5 pm and covid testing on 12/12/20 between 8-1 pm at the [facility]. Pre-surgical instructions will be mailed.

## 2020-11-09 ENCOUNTER — Other Ambulatory Visit (INDEPENDENT_AMBULATORY_CARE_PROVIDER_SITE_OTHER): Payer: Self-pay | Admitting: Nurse Practitioner

## 2020-11-09 ENCOUNTER — Other Ambulatory Visit: Payer: Medicare HMO

## 2020-11-14 ENCOUNTER — Other Ambulatory Visit: Payer: Medicare HMO

## 2020-11-24 ENCOUNTER — Other Ambulatory Visit: Payer: Self-pay | Admitting: Psychiatry

## 2020-11-24 DIAGNOSIS — F411 Generalized anxiety disorder: Secondary | ICD-10-CM

## 2020-11-25 DIAGNOSIS — M533 Sacrococcygeal disorders, not elsewhere classified: Secondary | ICD-10-CM | POA: Diagnosis not present

## 2020-11-25 DIAGNOSIS — M47816 Spondylosis without myelopathy or radiculopathy, lumbar region: Secondary | ICD-10-CM | POA: Diagnosis not present

## 2020-11-25 DIAGNOSIS — M5126 Other intervertebral disc displacement, lumbar region: Secondary | ICD-10-CM | POA: Diagnosis not present

## 2020-11-29 ENCOUNTER — Telehealth: Payer: Self-pay | Admitting: *Deleted

## 2020-11-29 DIAGNOSIS — I10 Essential (primary) hypertension: Secondary | ICD-10-CM | POA: Diagnosis not present

## 2020-11-29 DIAGNOSIS — E782 Mixed hyperlipidemia: Secondary | ICD-10-CM | POA: Diagnosis not present

## 2020-11-29 DIAGNOSIS — I2510 Atherosclerotic heart disease of native coronary artery without angina pectoris: Secondary | ICD-10-CM | POA: Diagnosis not present

## 2020-11-29 NOTE — Telephone Encounter (Signed)
Patient called and reported that the Neurontin and Vistaril were not working to help her sleep.  She had it cancel her upcoming appt and reschedule due to surgery to have an aneurism removed.  She is requesting a change in night time meds. Please review.

## 2020-11-30 DIAGNOSIS — I2510 Atherosclerotic heart disease of native coronary artery without angina pectoris: Secondary | ICD-10-CM | POA: Diagnosis not present

## 2020-11-30 DIAGNOSIS — E782 Mixed hyperlipidemia: Secondary | ICD-10-CM | POA: Diagnosis not present

## 2020-11-30 DIAGNOSIS — I10 Essential (primary) hypertension: Secondary | ICD-10-CM | POA: Diagnosis not present

## 2020-11-30 NOTE — Telephone Encounter (Signed)
Attempted to contact patient, left a voicemail. 

## 2020-12-01 ENCOUNTER — Other Ambulatory Visit: Payer: Self-pay | Admitting: Orthopedic Surgery

## 2020-12-01 ENCOUNTER — Other Ambulatory Visit (HOSPITAL_COMMUNITY): Payer: Self-pay | Admitting: Orthopedic Surgery

## 2020-12-01 DIAGNOSIS — M5126 Other intervertebral disc displacement, lumbar region: Secondary | ICD-10-CM

## 2020-12-01 DIAGNOSIS — M810 Age-related osteoporosis without current pathological fracture: Secondary | ICD-10-CM | POA: Diagnosis not present

## 2020-12-01 DIAGNOSIS — M533 Sacrococcygeal disorders, not elsewhere classified: Secondary | ICD-10-CM

## 2020-12-01 DIAGNOSIS — M47816 Spondylosis without myelopathy or radiculopathy, lumbar region: Secondary | ICD-10-CM

## 2020-12-01 DIAGNOSIS — M5136 Other intervertebral disc degeneration, lumbar region: Secondary | ICD-10-CM

## 2020-12-05 ENCOUNTER — Encounter: Payer: Self-pay | Admitting: Vascular Surgery

## 2020-12-05 ENCOUNTER — Encounter
Admission: RE | Admit: 2020-12-05 | Discharge: 2020-12-05 | Disposition: A | Discharge status: Home / Self Care | Payer: Medicare HMO | Source: Ambulatory Visit | Attending: Vascular Surgery | Admitting: Vascular Surgery

## 2020-12-05 HISTORY — DX: Personal history of urinary calculi: Z87.442

## 2020-12-05 HISTORY — DX: Dyspnea, unspecified: R06.00

## 2020-12-05 HISTORY — DX: Malignant (primary) neoplasm, unspecified: C80.1

## 2020-12-05 NOTE — Patient Instructions (Addendum)
Your procedure is scheduled on: [facility]/26/2022[facility] Report to the Registration Desk on the 1st floor of the [facility]. To find out your arrival time, please call [phone] between 1PM - 3PM on: 12/13/2009- TUESDAY  REMEMBER: Instructions that are not followed completely may result in serious medical risk, up to and including death; or upon the discretion of your surgeon and anesthesiologist your surgery may need to be rescheduled.  Do not eat food after midnight the night before surgery.  No gum chewing, lozengers or hard candies.  You may however, drink CLEAR liquids up to 2 hours before you are scheduled to arrive for your surgery. Do not drink anything within 2 hours of your scheduled arrival time.  Clear liquids include: - water  - apple juice without pulp - gatorade (not RED, PURPLE, OR BLUE) - black coffee or tea (Do NOT add milk or creamers to the coffee or tea) Do NOT drink anything that is not on this list.   TAKE THESE MEDICATIONS THE MORNING OF SURGERY WITH A SIP OF WATER: - DULoxetine (CYMBALTA) 30 MG capsule - ezetimibe (ZETIA) 10 MG tablet - metoprolol succinate (TOPROL-XL) 50 MG 24 hr tablet   Follow recommendations from Cardiologist, Pulmonologist or PCP regarding stopping Aspirin, Coumadin, Plavix, Eliquis, Pradaxa, or Pletal. DO NOT TAKE ASPIRIN THE MORNING OF SURGERY.  One week prior to surgery: Stop Anti-inflammatories (NSAIDS) such as Advil, Aleve, Ibuprofen, Motrin, Naproxen, Naprosyn and Aspirin based products such as Excedrin, Goodys Powder, BC Powder.   Stop ANY OVER THE COUNTER supplements until after surgery. (However, you may continue taking Vitamin D, Vitamin B, and multivitamin up until the day before surgery.)  No Alcohol for 24 hours before or after surgery.  No Smoking including e-cigarettes for 24 hours prior to surgery.  No chewable tobacco products for at least 6 hours prior to surgery.  No nicotine patches on the day of  surgery.  Do not use any "recreational" drugs for at least a week prior to your surgery.  Please be advised that the combination of cocaine and anesthesia may have negative outcomes, up to and including death. If you test positive for cocaine, your surgery will be cancelled.  On the morning of surgery brush your teeth with toothpaste and water, you may rinse your mouth with mouthwash if you wish. Do not swallow any toothpaste or mouthwash.  Do not wear jewelry, make-up, hairpins, clips or nail polish.  Do not wear lotions, powders, or perfumes.   Do not shave body from the neck down 48 hours prior to surgery just in case you cut yourself which could leave a site for infection.  Also, freshly shaved skin may become irritated if using the CHG soap.  Contact lenses, hearing aids and dentures may not be worn into surgery.  Do not bring valuables to the hospital. [facility] is not responsible for any missing/lost belongings or valuables.   Use CHG Soap or wipes as directed on instruction sheet.  Notify your doctor if there is any change in your medical condition (cold, fever, infection).  Wear comfortable clothing (specific to your surgery type) to the hospital.  Plan for stool softeners for home use; pain medications have a tendency to cause constipation. You can also help prevent constipation by eating foods high in fiber such as fruits and vegetables and drinking plenty of fluids as your diet allows.  After surgery, you can help prevent lung complications by doing breathing exercises.  Take deep breaths and cough every  1-2 hours. Your doctor may order a device called an Incentive Spirometer to help you take deep breaths. When coughing or sneezing, hold a pillow firmly against your incision with both hands. This is called "splinting." Doing this helps protect your incision. It also decreases belly discomfort.  If you are being admitted to the hospital overnight, leave your suitcase in  the car. After surgery it may be brought to your room.  If you are being discharged the day of surgery, you will not be allowed to drive home. You will need a responsible adult (18 years or older) to drive you home and stay with you that night.   If you are taking public transportation, you will need to have a responsible adult (18 years or older) with you. Please confirm with your physician that it is acceptable to use public transportation.   Please call the [facility] at [phone] if you have any questions about these instructions.  Visitation Policy:  Patients undergoing a surgery or procedure may have one family member or support person with them as long as that person is not COVID-19 positive or experiencing its symptoms.  That person may remain in the waiting area during the procedure.  Inpatient Visitation:    Visiting hours are 7 a.m. to 8 p.m. Patients will be allowed one visitor. The visitor may change daily. The visitor must pass COVID-19 screenings, use hand sanitizer when entering and exiting the patient's room and wear a mask at all times, including in the patient's room. Patients must also wear a mask when staff or their visitor are in the room. Masking is required regardless of vaccination status. Systemwide, no visitors 17 or younger.

## 2020-12-06 ENCOUNTER — Ambulatory Visit (INDEPENDENT_AMBULATORY_CARE_PROVIDER_SITE_OTHER): Payer: Medicare HMO | Admitting: Vascular Surgery

## 2020-12-06 NOTE — Progress Notes (Signed)
[facility] Perioperative Services  Pre-Admission/Anesthesia Testing Clinical Review  Date: 12/12/20  Patient Demographics:  Name: Melissa Montes DOB:   04/23/1956 MRN:   [id number]  Planned Surgical Procedure(s):    Case: [id number] Date/Time: 12/14/20 0730   Procedure: ENDOVASCULAR REPAIR/STENT GRAFT (Bilateral )   Anesthesia type: General   Diagnosis: AAA (abdominal aortic aneurysm) without rupture ([place]) [I71.4]   Pre-op diagnosis:      Endovascular AAA Stent Graft   GORE     AAA     Covid  Dec 27     c:  Judi Cong   Location: [facility]   Providers: Algernon Huxley, MD    NOTE: Available PAT nursing documentation and vital signs have been reviewed. Clinical nursing staff has updated patient's PMH/PSHx, current medication list, and drug allergies/intolerances to ensure comprehensive history available to assist in medical decision making as it pertains to the aforementioned surgical procedure and anticipated anesthetic course.   Clinical Discussion:  Melissa Montes is a 65 y.o. female who is submitted for pre-surgical anesthesia review and clearance prior to her undergoing the above procedure. Patient is a Former Smoker (41 pack years; quit 09/2018). Pertinent PMH includes: CAD (s/p 2 vessel CABG), angina, MI (2007 and 2019), SVT, AAA, saddle thrombus of abdominal aorta, HTN, HLD, dyspnea, COPD, OA, ADD, depression, anxiety, bipolar disorder  Patient is followed by cardiology Humphrey Rolls, MD). She was last seen in the cardiology clinic on 06/23/2020; notes reviewed.  PMH (+) for MI x 2 (2007 and 2019).  Diagnostic left heart catheterization performed in 06/2018 revealed high-grade lesion (80%) in the distal left main with stents in the mid RCA with no significant (30%) in-stent restenosis. CABG was recommended; patient was referred to [facility].  Patient underwent a multivessel CABG on 07/16/2018 whereby grafts x 2 were placed (LIMA to LAD and SVG to  OM1). At the time of her clinic visit, patient denied any episodes of chest pain.  She did report occasional episodes of shortness of breath related to her known underlying COPD diagnosis.  Patient complained of dizziness and near syncopal episodes.  No PND, orthopnea, palpitations, or significant peripheral edema.  Blood pressure noted to be low in the office at 88/58 with a heart rate of 70 bpm.  Patient on a beta-blocker and statin following her cardiovascular surgery for risk factor modification.  Metoprolol dose decreased from 50 to 25 mg during this office visit.  Carotid duplex study performed on 09/15/2020 revealed velocities consistent with 1-39% stenosis of the bilateral ICAs, normal antegrade vertebral and subclavian flow.  Vascular ultrasound imaging of the abdominal aorta revealed abnormal dilatation of the distal abdominal aorta to about 4.0 cm, which is grossly unchanged from the previous assessment in 12/2019. Given patient's symptoms, the decision was made to repeat her TTE.  TTE performed on 07/11/2020 revealing normal left ventricular systolic function, mild valvular insufficiency, and normal PASP; LVEF 75% (see full interpretation of cardiovascular testing below).  No other changes were made to patient's medication regimen.  Patient to follow-up with outpatient cardiology at defined intervals for ongoing management and evaluation of her cardiovascular disease.  Patient scheduled to undergo EVAR procedure on 12/14/2020 with Dr. Leotis Pain.  Given patient's past medical history significant for cardiovascular disease and intervention, presurgical cardiac clearance was sought by the performing surgeon's office and PAT team.  Per cardiology, "this patient is optimized surgery from a cardiovascular standpoint.  She may proceed with planned procedure with  an overall ACCEPTABLE risk for associated complications". This patient is not on daily anticoagulation or antiplatelet therapy.   She reports  previous perioperative complications with anesthesia. She notes a PMH (+) for PONV.  She underwent a general anesthetic course at [facility] (ASA IV) in 06/2018 with no documented complications.   Vitals with BMI 10/28/2020 09/15/2020 08/29/2020  Height - - 5\' 1"   Weight 100 lbs 6 oz 102 lbs 13 oz 104 lbs  BMI - 69.48 54.62  Systolic 703 500 938  Diastolic 72 82 78  Pulse 89 91 65    Providers/Specialists:   NOTE: Primary physician provider listed below. Patient may have been seen by APP or partner within same practice.   PROVIDER ROLE / SPECIALTY LAST Imelda Pillow, MD Vascular Surgery  10/28/2020  Perrin Maltese, MD Primary Care Provider  ???  Neoma Laming, MD Cardiology  06/23/2020   Allergies:  Effexor xr [venlafaxine hcl], Penicillins, Amoxicillin-pot clavulanate, and Keflex [cephalexin]  Current Home Medications:   No current facility-administered medications for this encounter.   Marland Kitchen aspirin EC 81 MG tablet  . atorvastatin (LIPITOR) 80 MG tablet  . Cholecalciferol (VITAMIN D3) 1.25 MG (50000 UT) CAPS  . DULoxetine (CYMBALTA) 30 MG capsule  . ezetimibe (ZETIA) 10 MG tablet  . fluticasone (FLONASE) 50 MCG/ACT nasal spray  . gabapentin (NEURONTIN) 100 MG capsule  . hydrOXYzine (VISTARIL) 25 MG capsule  . metoprolol succinate (TOPROL-XL) 50 MG 24 hr tablet  . Multiple Vitamin (MULTIVITAMIN) tablet  . nitroGLYCERIN (NITROSTAT) 0.4 MG SL tablet  . Probiotic Product (PROBIOTIC DAILY PO)  . promethazine (PHENERGAN) 25 MG tablet  . traMADol HCl 100 MG TABS  . meclizine (ANTIVERT) 25 MG tablet   History:   Past Medical History:  Diagnosis Date  . ADD (attention deficit disorder)   . Anxiety   . Aortic aneurysm ([place])   . Atherosclerosis of abdominal aorta ([place])   . Bipolar disorder ([place])   . Bulging of lumbar intervertebral disc   . CAD (coronary artery disease)   . Cancer ([place])   . COPD (chronic obstructive pulmonary disease) ([place])   . Depression   .  Diverticulitis   . Dyspnea   . History of kidney stones   . Hyperlipidemia   . Hypertension   . Insomnia   . Lumbar spondylosis   . MI (myocardial infarction) ([place])    2007, 2019  . Migraine   . Osteoporosis   . PONV (postoperative nausea and vomiting)   . PVD (peripheral vascular disease) ([place])   . 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 SVT (supraventricular tachycardia) ([facility])    Past Surgical History:  Procedure Laterality Date  . ABDOMINAL HYSTERECTOMY    . CAROTID ANGIOGRAPHY N/A 01/13/2018   Procedure: CAROTID ANGIOGRAPHY;  Surgeon: Algernon Huxley, MD;  Location: [place] CV LAB;  Service: Cardiovascular;  Laterality: N/A;  . COLONOSCOPY    . CORONARY ANGIOPLASTY WITH STENT PLACEMENT    . iv INJECTIONS TO BACK    . LEFT HEART CATH Right 07/07/2018   Procedure: Left Heart Cath and Coronary Angiography;  Surgeon: Dionisio David, MD;  Location: [place] CV LAB;  Service: Cardiovascular;  Laterality: Right;   Family History  Problem Relation Age of Onset  . Breast cancer Mother 25  . Breast cancer Maternal Aunt        mat aunt and great aunt  .  Depression Sister    Social History   Tobacco Use  . Smoking status: Former Smoker    Packs/day: 1.00    Years: 41.00    Pack years: 41.00    Types: Cigarettes    Quit date: 09/19/2018    Years since quitting: 2.2  . Smokeless tobacco: Never Used  . Tobacco comment: uses nicotine gums  Substance Use Topics  . Alcohol use: No  . Drug use: No    Pertinent Clinical Results:  LABS: Labs reviewed: Acceptable for surgery.  Hospital Outpatient Visit on 12/12/2020  Component Date Value Ref Range Status  . WBC 12/12/2020 11.1* 4.0 - 10.5 K/uL Final  . RBC 12/12/2020 4.91  3.87 - 5.11 MIL/uL Final  . Hemoglobin 12/12/2020 14.6  12.0 - 15.0 g/dL Final  . HCT 12/12/2020 43.2  36.0 - 46.0 % Final  . MCV 12/12/2020 88.0  80.0 - 100.0 fL Final  . MCH  12/12/2020 29.7  26.0 - 34.0 pg Final  . MCHC 12/12/2020 33.8  30.0 - 36.0 g/dL Final  . RDW 12/12/2020 13.2  11.5 - 15.5 % Final  . Platelets 12/12/2020 298  150 - 400 K/uL Final  . nRBC 12/12/2020 0.0  0.0 - 0.2 % Final  . Neutrophils Relative % 12/12/2020 76  % Final  . Neutro Abs 12/12/2020 8.4* 1.7 - 7.7 K/uL Final  . Lymphocytes Relative 12/12/2020 15  % Final  . Lymphs Abs 12/12/2020 1.7  0.7 - 4.0 K/uL Final  . Monocytes Relative 12/12/2020 5  % Final  . Monocytes Absolute 12/12/2020 0.6  0.1 - 1.0 K/uL Final  . Eosinophils Relative 12/12/2020 3  % Final  . Eosinophils Absolute 12/12/2020 0.3  0.0 - 0.5 K/uL Final  . Basophils Relative 12/12/2020 1  % Final  . Basophils Absolute 12/12/2020 0.1  0.0 - 0.1 K/uL Final  . Immature Granulocytes 12/12/2020 0  % Final  . Abs Immature Granulocytes 12/12/2020 0.03  0.00 - 0.07 K/uL Final   Performed at [facility], [address], [place], [place] [address]  . Sodium 12/12/2020 142  135 - 145 mmol/L Final  . Potassium 12/12/2020 4.3  3.5 - 5.1 mmol/L Final  . Chloride 12/12/2020 105  98 - 111 mmol/L Final  . CO2 12/12/2020 27  22 - 32 mmol/L Final  . Glucose, Bld 12/12/2020 110* 70 - 99 mg/dL Final   Glucose reference range applies only to samples taken after fasting for at least 8 hours.  . BUN 12/12/2020 15  8 - 23 mg/dL Final  . Creatinine, Ser 12/12/2020 0.70  0.44 - 1.00 mg/dL Final  . Calcium 12/12/2020 9.4  8.9 - 10.3 mg/dL Final  . GFR, Estimated 12/12/2020 >60  >60 mL/min Final   Comment: (NOTE) Calculated using the CKD-EPI Creatinine Equation (2021)   . Anion gap 12/12/2020 10  5 - 15 Final   Performed at [facility], [address] [place], [place] [address]  . Prothrombin Time 12/12/2020 13.2  11.4 - 15.2 seconds Final  . INR 12/12/2020 1.0  0.8 - 1.2 Final   Comment: (NOTE) INR goal varies based on device and disease states. Performed at [facility], [address]  [place], [place] [address]   . aPTT 12/12/2020 30  24 - 36 seconds Final   Performed at [facility], [address] [place], [place] [address]  . ABO/RH(D) 12/12/2020 PENDING   Incomplete  . Antibody Screen 12/12/2020 PENDING   Incomplete  .  Sample Expiration 12/12/2020 12/26/2020,2359   Final  . Extend sample reason 12/12/2020    Final                   Value:NO TRANSFUSIONS OR PREGNANCY IN THE PAST 3 MONTHS Performed at [facility], [address] [place], [place] [address]     ECG: Date: 12/12/2020 Time ECG obtained: 1056 AM Rate: 76 bpm Rhythm: normal sinus Axis (leads I and aVF): Normal Intervals: PR 118 ms. QRS 84 ms. QTc 375 ms. ST segment and T wave changes: Nonspecific T wave abnormality Comparison: Similar to previous tracing obtained on 03/19/2020   IMAGING / PROCEDURES: CT ANGIO ABDOMEN PELVIS W/WO CONTRAST performed on 10/25/2020 1. 4.4 cm infrarenal abdominal aortic aneurysm.  Recommend follow-up every 12 months and vascular consultation. 2. Cholelithiasis 3. Aortic atherosclerosis 4. Emphysema  VASCULAR ULTRASOUND AAA DUPLEX LIMITED performed on 09/07/2020 1. Abdominal aorta: There is evidence of abnormal dilatation of the distal abdominal aorta.  2. The largest aortic diameter remains essentially unchanged compared to prior exam.  3. Previous diameter measurement was 4.0 cm obtained on 12/29/2019.  BILATERAL VASCULAR CAROTID DUPLEX performed on 09/15/2020 1. Right Carotid: Velocities in the right ICA are consistent with a 1-39% stenosis.  2. Left Carotid: Velocities in the left ICA are consistent with a 1-39% stenosis.  3. Vertebrals:  Bilateral vertebral arteries demonstrate antegrade flow. 4. Subclavians: Normal flow hemodynamics were seen in bilateral subclavian arteries.   ECHOCARDIOGRAM performed on 07/11/2020 1. LVEF 75% 2. Unable to visualize and evaluate the right side of the heart due to poor windows 3. Mildly dilated left atrium  with left ventricle, right atrium and ventricle, and aorta appear normal in size 4. Normal biventricular systolic function 5. Normal left ventricular wall motion 6. Mild left ventricular hypertrophy with grade 1 diastolic dysfunction 7. Mild pulmonary valve regurgitation 8. Mild to moderate tricuspid valve regurgitation 9. Normal PASP 10. Mild mitral valve regurgitation 11. Trace to mild aortic valve regurgitation 12. No evidence of pericardial effusion 13. No evidence of ASD/VSD  CT TAA ANGIOGRAM PROTOCOL INCLUDING CTA CHEST ABDOMEN PELVIS W/WO CONTRAST performed on 05/09/2019 1. Severe three-vessel coronary artery calcification 2. Infrarenal abdominal aortic aneurysm measures 3.8 x 3.5 cm in true orthogonal dimensions.  3. Moderate amount of circumferential intraluminal thrombus versus noncalcified atherosclerotic plaque.  4. Moderate narrowing at the origin of the right common iliac artery secondary to mixed atherosclerotic plaque.  5. Possible left jugular vein filling defect which may be due to mixing artifact, however cannot exclude DVT. 6. Severe upper lobe predominant paraseptal emphysema. Diffuse bronchial wall thickening may reflect bronchitis (acute or chronic).   LEFT HEART CATHETERIZATION AND CORONARY ANGIOGRAPHY performed on 07/07/2018 1. High-grade lesion in the distal left main (80%) 2. Proximal RCA to mid RCA lesion (30%); stents in place with no significant in-stent restenosis 3. LVEF 60%    Impression and Plan:  Melissa Montes has been referred for pre-anesthesia review and clearance prior to her undergoing the planned anesthetic and procedural courses. Available labs, pertinent testing, and imaging results were personally reviewed by me. This patient has been appropriately cleared by cardiology with an overall ACCEPTABLE risk for significant perioperative cardiovascular complications.   Based on clinical review performed today (12/12/20), barring any significant  acute changes in the patient's overall condition, it is anticipated that she will be able to proceed with the planned surgical intervention. Any acute changes in clinical condition may necessitate her procedure being postponed and/or cancelled.  Pre-surgical instructions were reviewed with the patient during her PAT appointment and questions were fielded by PAT clinical staff.  Honor Loh, MSN, APRN, FNP-C, CEN [facility]  Peri-operative Services Nurse Practitioner Phone: [phone] 12/12/20 2:08 PM  NOTE: This note has been prepared using Dragon dictation software. Despite my best ability to proofread, there is always the potential that unintentional transcriptional errors may still occur from this process.

## 2020-12-09 ENCOUNTER — Ambulatory Visit: Admission: RE | Admit: 2020-12-09 | Payer: Medicare HMO | Source: Ambulatory Visit

## 2020-12-12 ENCOUNTER — Other Ambulatory Visit: Payer: Self-pay

## 2020-12-12 ENCOUNTER — Other Ambulatory Visit
Admission: RE | Admit: 2020-12-12 | Discharge: 2020-12-12 | Disposition: A | Discharge status: Home / Self Care | Payer: Medicare HMO | Source: Ambulatory Visit | Attending: Vascular Surgery | Admitting: Vascular Surgery

## 2020-12-12 DIAGNOSIS — Z01818 Encounter for other preprocedural examination: Secondary | ICD-10-CM | POA: Insufficient documentation

## 2020-12-12 DIAGNOSIS — Z01810 Encounter for preprocedural cardiovascular examination: Secondary | ICD-10-CM | POA: Diagnosis not present

## 2020-12-12 DIAGNOSIS — Z20822 Contact with and (suspected) exposure to covid-19: Secondary | ICD-10-CM | POA: Insufficient documentation

## 2020-12-12 LAB — CBC WITH DIFFERENTIAL/PLATELET
Abs Immature Granulocytes: 0.03 10*3/uL (ref 0.00–0.07)
Basophils Absolute: 0.1 10*3/uL (ref 0.0–0.1)
Basophils Relative: 1 %
Eosinophils Absolute: 0.3 10*3/uL (ref 0.0–0.5)
Eosinophils Relative: 3 %
HCT: 43.2 % (ref 36.0–46.0)
Hemoglobin: 14.6 g/dL (ref 12.0–15.0)
Immature Granulocytes: 0 %
Lymphocytes Relative: 15 %
Lymphs Abs: 1.7 10*3/uL (ref 0.7–4.0)
MCH: 29.7 pg (ref 26.0–34.0)
MCHC: 33.8 g/dL (ref 30.0–36.0)
MCV: 88 fL (ref 80.0–100.0)
Monocytes Absolute: 0.6 10*3/uL (ref 0.1–1.0)
Monocytes Relative: 5 %
Neutro Abs: 8.4 10*3/uL — ABNORMAL HIGH (ref 1.7–7.7)
Neutrophils Relative %: 76 %
Platelets: 298 10*3/uL (ref 150–400)
RBC: 4.91 MIL/uL (ref 3.87–5.11)
RDW: 13.2 % (ref 11.5–15.5)
WBC: 11.1 10*3/uL — ABNORMAL HIGH (ref 4.0–10.5)
nRBC: 0 % (ref 0.0–0.2)

## 2020-12-12 LAB — BASIC METABOLIC PANEL
Anion gap: 10 (ref 5–15)
BUN: 15 mg/dL (ref 8–23)
CO2: 27 mmol/L (ref 22–32)
Calcium: 9.4 mg/dL (ref 8.9–10.3)
Chloride: 105 mmol/L (ref 98–111)
Creatinine, Ser: 0.7 mg/dL (ref 0.44–1.00)
GFR, Estimated: >60 mL/min (ref >60)
Glucose, Bld: 110 mg/dL — ABNORMAL HIGH (ref 70–99)
Potassium: 4.3 mmol/L (ref 3.5–5.1)
Sodium: 142 mmol/L (ref 135–145)

## 2020-12-12 LAB — TYPE AND SCREEN
ABO/RH(D): A POS
Antibody Screen: NEGATIVE

## 2020-12-12 LAB — PROTIME-INR
INR: 1 (ref 0.8–1.2)
Prothrombin Time: 13.2 seconds (ref 11.4–15.2)

## 2020-12-12 LAB — APTT: aPTT: 30 seconds (ref 24–36)

## 2020-12-13 LAB — SARS CORONAVIRUS 2 (TAT 6-24 HRS): SARS Coronavirus 2: NEGATIVE

## 2020-12-14 ENCOUNTER — Encounter: Payer: Self-pay | Admitting: Vascular Surgery

## 2020-12-14 ENCOUNTER — Inpatient Hospital Stay: Payer: Medicare HMO | Admitting: Urgent Care

## 2020-12-14 ENCOUNTER — Encounter
Admission: RE | Disposition: A | Discharge status: Home / Self Care | Payer: Self-pay | Source: Home / Self Care | Attending: Vascular Surgery

## 2020-12-14 ENCOUNTER — Inpatient Hospital Stay
Admission: RE | Admit: 2020-12-14 | Discharge: 2020-12-15 | DRG: 269 | Disposition: A | Discharge status: Home / Self Care | Payer: Medicare HMO | Attending: Vascular Surgery | Admitting: Vascular Surgery

## 2020-12-14 ENCOUNTER — Other Ambulatory Visit: Payer: Self-pay

## 2020-12-14 DIAGNOSIS — E785 Hyperlipidemia, unspecified: Secondary | ICD-10-CM | POA: Diagnosis not present

## 2020-12-14 DIAGNOSIS — Z881 Allergy status to other antibiotic agents status: Secondary | ICD-10-CM | POA: Diagnosis not present

## 2020-12-14 DIAGNOSIS — F419 Anxiety disorder, unspecified: Secondary | ICD-10-CM | POA: Diagnosis present

## 2020-12-14 DIAGNOSIS — I9789 Other postprocedural complications and disorders of the circulatory system, not elsewhere classified: Secondary | ICD-10-CM | POA: Diagnosis not present

## 2020-12-14 DIAGNOSIS — I745 Embolism and thrombosis of iliac artery: Secondary | ICD-10-CM | POA: Diagnosis present

## 2020-12-14 DIAGNOSIS — I2510 Atherosclerotic heart disease of native coronary artery without angina pectoris: Secondary | ICD-10-CM | POA: Diagnosis not present

## 2020-12-14 DIAGNOSIS — Z20822 Contact with and (suspected) exposure to covid-19: Secondary | ICD-10-CM | POA: Diagnosis not present

## 2020-12-14 DIAGNOSIS — I471 Supraventricular tachycardia: Secondary | ICD-10-CM | POA: Diagnosis present

## 2020-12-14 DIAGNOSIS — Z90710 Acquired absence of both cervix and uterus: Secondary | ICD-10-CM

## 2020-12-14 DIAGNOSIS — I714 Abdominal aortic aneurysm, without rupture, unspecified: Secondary | ICD-10-CM

## 2020-12-14 DIAGNOSIS — Z951 Presence of aortocoronary bypass graft: Secondary | ICD-10-CM | POA: Diagnosis not present

## 2020-12-14 DIAGNOSIS — J449 Chronic obstructive pulmonary disease, unspecified: Secondary | ICD-10-CM | POA: Diagnosis present

## 2020-12-14 DIAGNOSIS — G43909 Migraine, unspecified, not intractable, without status migrainosus: Secondary | ICD-10-CM | POA: Diagnosis present

## 2020-12-14 DIAGNOSIS — Y838 Other surgical procedures as the cause of abnormal reaction of the patient, or of later complication, without mention of misadventure at the time of the procedure: Secondary | ICD-10-CM | POA: Diagnosis not present

## 2020-12-14 DIAGNOSIS — Z87891 Personal history of nicotine dependence: Secondary | ICD-10-CM | POA: Diagnosis not present

## 2020-12-14 DIAGNOSIS — E7800 Pure hypercholesterolemia, unspecified: Secondary | ICD-10-CM | POA: Diagnosis not present

## 2020-12-14 DIAGNOSIS — M810 Age-related osteoporosis without current pathological fracture: Secondary | ICD-10-CM | POA: Diagnosis present

## 2020-12-14 DIAGNOSIS — I7409 Other arterial embolism and thrombosis of abdominal aorta: Secondary | ICD-10-CM | POA: Diagnosis not present

## 2020-12-14 DIAGNOSIS — F988 Other specified behavioral and emotional disorders with onset usually occurring in childhood and adolescence: Secondary | ICD-10-CM | POA: Diagnosis present

## 2020-12-14 DIAGNOSIS — I739 Peripheral vascular disease, unspecified: Secondary | ICD-10-CM | POA: Diagnosis present

## 2020-12-14 DIAGNOSIS — I10 Essential (primary) hypertension: Secondary | ICD-10-CM | POA: Diagnosis present

## 2020-12-14 DIAGNOSIS — Z87442 Personal history of urinary calculi: Secondary | ICD-10-CM

## 2020-12-14 DIAGNOSIS — Z818 Family history of other mental and behavioral disorders: Secondary | ICD-10-CM

## 2020-12-14 DIAGNOSIS — Z888 Allergy status to other drugs, medicaments and biological substances status: Secondary | ICD-10-CM

## 2020-12-14 DIAGNOSIS — F319 Bipolar disorder, unspecified: Secondary | ICD-10-CM | POA: Diagnosis present

## 2020-12-14 DIAGNOSIS — I252 Old myocardial infarction: Secondary | ICD-10-CM | POA: Diagnosis not present

## 2020-12-14 DIAGNOSIS — I6523 Occlusion and stenosis of bilateral carotid arteries: Secondary | ICD-10-CM | POA: Diagnosis present

## 2020-12-14 DIAGNOSIS — Z880 Allergy status to penicillin: Secondary | ICD-10-CM

## 2020-12-14 HISTORY — DX: Supraventricular tachycardia, unspecified: I47.10

## 2020-12-14 HISTORY — DX: Other specified postprocedural states: Z98.890

## 2020-12-14 HISTORY — PX: ENDOVASCULAR REPAIR/STENT GRAFT: CATH118280

## 2020-12-14 HISTORY — DX: Supraventricular tachycardia: I47.1

## 2020-12-14 HISTORY — DX: Other intervertebral disc displacement, lumbar region: M51.26

## 2020-12-14 HISTORY — DX: Saddle embolus of abdominal aorta: I74.01

## 2020-12-14 HISTORY — DX: Migraine, unspecified, not intractable, without status migrainosus: G43.909

## 2020-12-14 HISTORY — DX: Insomnia, unspecified: G47.00

## 2020-12-14 HISTORY — DX: Other intervertebral disc degeneration, lumbar region: M51.36

## 2020-12-14 HISTORY — DX: Spondylosis without myelopathy or radiculopathy, lumbar region: M47.816

## 2020-12-14 HISTORY — DX: Other specified postprocedural states: R11.2

## 2020-12-14 HISTORY — DX: Peripheral vascular disease, unspecified: I73.9

## 2020-12-14 HISTORY — DX: Chronic obstructive pulmonary disease, unspecified: J44.9

## 2020-12-14 HISTORY — DX: Atherosclerotic heart disease of native coronary artery without angina pectoris: I25.10

## 2020-12-14 HISTORY — DX: Other specified behavioral and emotional disorders with onset usually occurring in childhood and adolescence: F98.8

## 2020-12-14 HISTORY — DX: Aortic aneurysm of unspecified site, without rupture: I71.9

## 2020-12-14 HISTORY — DX: Other intervertebral disc degeneration, lumbar region without mention of lumbar back pain or lower extremity pain: M51.369

## 2020-12-14 HISTORY — DX: Atherosclerosis of aorta: I70.0

## 2020-12-14 HISTORY — DX: Age-related osteoporosis without current pathological fracture: M81.0

## 2020-12-14 LAB — ABO/RH: ABO/RH(D): A POS

## 2020-12-14 SURGERY — ENDOVASCULAR REPAIR/STENT GRAFT
Anesthesia: General | Laterality: Bilateral

## 2020-12-14 MED ORDER — TRAMADOL HCL 50 MG PO TABS: 100.0000 mg | ORAL_TABLET | Freq: Two times a day (BID) | ORAL | Status: DC | PRN

## 2020-12-14 MED ORDER — VANCOMYCIN HCL IN DEXTROSE 1-5 GM/200ML-% IV SOLN
INTRAVENOUS | Status: AC
  Administered 2020-12-14: 1000 mg via INTRAVENOUS
  Filled 2020-12-14: qty 200

## 2020-12-14 MED ORDER — VITAMIN D3 1.25 MG (50000 UT) PO CAPS: 1.0000 | ORAL_CAPSULE | ORAL | Status: DC

## 2020-12-14 MED ORDER — ONDANSETRON HCL 4 MG/2ML IJ SOLN
INTRAMUSCULAR | Status: AC
  Filled 2020-12-14: qty 2

## 2020-12-14 MED ORDER — DULOXETINE HCL 30 MG PO CPEP
30.0000 mg | ORAL_CAPSULE | Freq: Every day | ORAL | Status: DC
  Administered 2020-12-15: 30 mg via ORAL
  Filled 2020-12-14: qty 1

## 2020-12-14 MED ORDER — ORAL CARE MOUTH RINSE: 15.0000 mL | Freq: Once | OROMUCOSAL | Status: DC

## 2020-12-14 MED ORDER — MECLIZINE HCL 25 MG PO TABS
25.0000 mg | ORAL_TABLET | ORAL | Status: DC | PRN
  Filled 2020-12-14: qty 1

## 2020-12-14 MED ORDER — GUAIFENESIN-DM 100-10 MG/5ML PO SYRP
15.0000 mL | ORAL_SOLUTION | ORAL | Status: DC | PRN
  Filled 2020-12-14: qty 15

## 2020-12-14 MED ORDER — CHLORHEXIDINE GLUCONATE 0.12 % MT SOLN
15.0000 mL | Freq: Once | OROMUCOSAL | Status: DC
  Filled 2020-12-14: qty 15

## 2020-12-14 MED ORDER — DOCUSATE SODIUM 100 MG PO CAPS
100.0000 mg | ORAL_CAPSULE | Freq: Every day | ORAL | Status: DC
  Administered 2020-12-15: 100 mg via ORAL
  Filled 2020-12-14: qty 1

## 2020-12-14 MED ORDER — HYDROMORPHONE HCL 1 MG/ML IJ SOLN
1.0000 mg | Freq: Once | INTRAMUSCULAR | Status: AC | PRN
  Administered 2020-12-14: 1 mg via INTRAVENOUS

## 2020-12-14 MED ORDER — ASPIRIN 81 MG PO CHEW
CHEWABLE_TABLET | ORAL | Status: AC
  Administered 2020-12-14: 162 mg via ORAL
  Filled 2020-12-14: qty 2

## 2020-12-14 MED ORDER — ALUM & MAG HYDROXIDE-SIMETH 200-200-20 MG/5ML PO SUSP
15.0000 mL | ORAL | Status: DC | PRN
Start: 2020-12-14 — End: 2020-12-15

## 2020-12-14 MED ORDER — PROPOFOL 10 MG/ML IV BOLUS
INTRAVENOUS | Status: AC
  Filled 2020-12-14: qty 20

## 2020-12-14 MED ORDER — ONDANSETRON HCL 4 MG/2ML IJ SOLN
INTRAMUSCULAR | Status: DC | PRN
  Administered 2020-12-14: 4 mg via INTRAVENOUS

## 2020-12-14 MED ORDER — MORPHINE SULFATE (PF) 4 MG/ML IV SOLN: 2.0000 mg | INTRAVENOUS | Status: DC | PRN

## 2020-12-14 MED ORDER — VANCOMYCIN HCL IN DEXTROSE 1-5 GM/200ML-% IV SOLN
1000.0000 mg | INTRAVENOUS | Status: AC
  Administered 2020-12-14: 1000 mg via INTRAVENOUS

## 2020-12-14 MED ORDER — LIDOCAINE HCL (PF) 2 % IJ SOLN
INTRAMUSCULAR | Status: AC
  Filled 2020-12-14: qty 5

## 2020-12-14 MED ORDER — VANCOMYCIN HCL IN DEXTROSE 1-5 GM/200ML-% IV SOLN
1000.0000 mg | Freq: Two times a day (BID) | INTRAVENOUS | Status: AC
  Administered 2020-12-15: 1000 mg via INTRAVENOUS

## 2020-12-14 MED ORDER — CHLORHEXIDINE GLUCONATE CLOTH 2 % EX PADS: 6.0000 | MEDICATED_PAD | Freq: Once | CUTANEOUS | Status: DC

## 2020-12-14 MED ORDER — PROMETHAZINE HCL 25 MG PO TABS: 25.0000 mg | ORAL_TABLET | Freq: Three times a day (TID) | ORAL | Status: DC | PRN

## 2020-12-14 MED ORDER — ONDANSETRON HCL 4 MG/2ML IJ SOLN: 4.0000 mg | Freq: Four times a day (QID) | INTRAMUSCULAR | Status: DC | PRN

## 2020-12-14 MED ORDER — DEXAMETHASONE SODIUM PHOSPHATE 10 MG/ML IJ SOLN
INTRAMUSCULAR | Status: DC | PRN
  Administered 2020-12-14: 8 mg via INTRAVENOUS

## 2020-12-14 MED ORDER — DOPAMINE-DEXTROSE 3.2-5 MG/ML-% IV SOLN
INTRAVENOUS | Status: AC
  Administered 2020-12-14: 3 ug/kg/min via INTRAVENOUS
  Filled 2020-12-14: qty 250

## 2020-12-14 MED ORDER — ACETAMINOPHEN 325 MG PO TABS
325.0000 mg | ORAL_TABLET | ORAL | Status: DC | PRN
Start: 2020-12-14 — End: 2020-12-15

## 2020-12-14 MED ORDER — ASPIRIN 81 MG PO CHEW: 162.0000 mg | CHEWABLE_TABLET | Freq: Every evening | ORAL | Status: DC

## 2020-12-14 MED ORDER — SODIUM CHLORIDE 0.9 % IV SOLN
500.0000 mL | Freq: Once | INTRAVENOUS | Status: AC | PRN
  Administered 2020-12-14: 500 mL via INTRAVENOUS

## 2020-12-14 MED ORDER — SODIUM CHLORIDE 0.9 % IV SOLN: INTRAVENOUS | Status: DC

## 2020-12-14 MED ORDER — SUGAMMADEX SODIUM 200 MG/2ML IV SOLN
INTRAVENOUS | Status: DC | PRN
  Administered 2020-12-14: 100 mg via INTRAVENOUS

## 2020-12-14 MED ORDER — SEVOFLURANE IN SOLN
RESPIRATORY_TRACT | Status: AC
  Filled 2020-12-14: qty 250

## 2020-12-14 MED ORDER — OXYCODONE-ACETAMINOPHEN 5-325 MG PO TABS
1.0000 | ORAL_TABLET | ORAL | Status: DC | PRN
  Administered 2020-12-14 (×2): 2 via ORAL

## 2020-12-14 MED ORDER — ACETAMINOPHEN 650 MG RE SUPP
325.0000 mg | RECTAL | Status: DC | PRN
Start: 2020-12-14 — End: 2020-12-15

## 2020-12-14 MED ORDER — LACTATED RINGERS IV SOLN: INTRAVENOUS | Status: DC

## 2020-12-14 MED ORDER — PROMETHAZINE HCL 25 MG/ML IJ SOLN
6.2500 mg | INTRAMUSCULAR | Status: DC | PRN
Start: 2020-12-14 — End: 2020-12-14

## 2020-12-14 MED ORDER — ADULT MULTIVITAMIN W/MINERALS CH
1.0000 | ORAL_TABLET | Freq: Every day | ORAL | Status: DC
  Administered 2020-12-15: 1 via ORAL
  Filled 2020-12-14: qty 1

## 2020-12-14 MED ORDER — FAMOTIDINE IN NACL 20-0.9 MG/50ML-% IV SOLN
20.0000 mg | Freq: Two times a day (BID) | INTRAVENOUS | Status: DC
  Administered 2020-12-14 – 2020-12-15 (×2): 20 mg via INTRAVENOUS
  Filled 2020-12-14 (×3): qty 50

## 2020-12-14 MED ORDER — DEXAMETHASONE SODIUM PHOSPHATE 10 MG/ML IJ SOLN
INTRAMUSCULAR | Status: AC
  Filled 2020-12-14: qty 1

## 2020-12-14 MED ORDER — RISAQUAD PO CAPS
1.0000 | ORAL_CAPSULE | Freq: Every day | ORAL | Status: DC
  Administered 2020-12-15: 1 via ORAL
  Filled 2020-12-14: qty 1

## 2020-12-14 MED ORDER — ASPIRIN EC 81 MG PO TBEC
162.0000 mg | DELAYED_RELEASE_TABLET | Freq: Every evening | ORAL | Status: DC
  Filled 2020-12-14: qty 2

## 2020-12-14 MED ORDER — OXYCODONE-ACETAMINOPHEN 5-325 MG PO TABS
ORAL_TABLET | ORAL | Status: AC
  Filled 2020-12-14: qty 2

## 2020-12-14 MED ORDER — HYDROXYZINE HCL 50 MG PO TABS
50.0000 mg | ORAL_TABLET | Freq: Every evening | ORAL | Status: DC | PRN
  Filled 2020-12-14: qty 1

## 2020-12-14 MED ORDER — EZETIMIBE 10 MG PO TABS
10.0000 mg | ORAL_TABLET | Freq: Every day | ORAL | Status: DC
  Administered 2020-12-15: 10 mg via ORAL
  Filled 2020-12-14: qty 1

## 2020-12-14 MED ORDER — VANCOMYCIN HCL 1000 MG IV SOLR: INTRAVENOUS | Status: DC | PRN

## 2020-12-14 MED ORDER — HYDROXYZINE PAMOATE 50 MG PO CAPS: 50.0000 mg | ORAL_CAPSULE | ORAL | Status: DC

## 2020-12-14 MED ORDER — FLUTICASONE PROPIONATE 50 MCG/ACT NA SUSP
1.0000 | Freq: Every day | NASAL | Status: DC | PRN
  Filled 2020-12-14: qty 16

## 2020-12-14 MED ORDER — HYDROMORPHONE HCL 1 MG/ML IJ SOLN
INTRAMUSCULAR | Status: AC
  Filled 2020-12-14: qty 1

## 2020-12-14 MED ORDER — NITROGLYCERIN IN D5W 200-5 MCG/ML-% IV SOLN: 5.0000 ug/min | INTRAVENOUS | Status: DC

## 2020-12-14 MED ORDER — DOPAMINE-DEXTROSE 3.2-5 MG/ML-% IV SOLN: 3.0000 ug/kg/min | INTRAVENOUS | Status: DC

## 2020-12-14 MED ORDER — SODIUM CHLORIDE 0.9 % IV SOLN: INTRAVENOUS | Status: DC | PRN

## 2020-12-14 MED ORDER — HYDROXYZINE HCL 25 MG PO TABS
25.0000 mg | ORAL_TABLET | Freq: Every day | ORAL | Status: DC | PRN
  Filled 2020-12-14: qty 1

## 2020-12-14 MED ORDER — ROCURONIUM BROMIDE 100 MG/10ML IV SOLN
INTRAVENOUS | Status: DC | PRN
  Administered 2020-12-14: 50 mg via INTRAVENOUS

## 2020-12-14 MED ORDER — ATORVASTATIN CALCIUM 80 MG PO TABS
80.0000 mg | ORAL_TABLET | Freq: Every evening | ORAL | Status: DC
  Administered 2020-12-14: 80 mg via ORAL
  Filled 2020-12-14 (×2): qty 1

## 2020-12-14 MED ORDER — IODIXANOL 320 MG/ML IV SOLN
INTRAVENOUS | Status: DC | PRN
  Administered 2020-12-14: 65 mL

## 2020-12-14 MED ORDER — PHENYLEPHRINE HCL (PRESSORS) 10 MG/ML IV SOLN
INTRAVENOUS | Status: DC | PRN
  Administered 2020-12-14: 200 ug via INTRAVENOUS

## 2020-12-14 MED ORDER — METOPROLOL SUCCINATE ER 25 MG PO TB24
50.0000 mg | ORAL_TABLET | Freq: Every day | ORAL | Status: DC
  Administered 2020-12-15: 50 mg via ORAL
  Filled 2020-12-14: qty 2

## 2020-12-14 MED ORDER — PHENYLEPHRINE HCL-NACL 10-0.9 MG/250ML-% IV SOLN
INTRAVENOUS | Status: DC | PRN
  Administered 2020-12-14: 50 ug/min via INTRAVENOUS

## 2020-12-14 MED ORDER — MAGNESIUM SULFATE 2 GM/50ML IV SOLN
2.0000 g | Freq: Every day | INTRAVENOUS | Status: DC | PRN
  Filled 2020-12-14: qty 50

## 2020-12-14 MED ORDER — GLYCOPYRROLATE 0.2 MG/ML IJ SOLN
INTRAMUSCULAR | Status: DC | PRN
  Administered 2020-12-14: .2 mg via INTRAVENOUS

## 2020-12-14 MED ORDER — METOPROLOL TARTRATE 5 MG/5ML IV SOLN: 2.0000 mg | INTRAVENOUS | Status: DC | PRN

## 2020-12-14 MED ORDER — POTASSIUM CHLORIDE CRYS ER 20 MEQ PO TBCR: 20.0000 meq | EXTENDED_RELEASE_TABLET | Freq: Every day | ORAL | Status: DC | PRN

## 2020-12-14 MED ORDER — VITAMIN D (ERGOCALCIFEROL) 1.25 MG (50000 UNIT) PO CAPS
50000.0000 [IU] | ORAL_CAPSULE | ORAL | Status: DC
  Administered 2020-12-15: 50000 [IU] via ORAL
  Filled 2020-12-14: qty 1

## 2020-12-14 MED ORDER — PROPOFOL 10 MG/ML IV BOLUS
INTRAVENOUS | Status: DC | PRN
  Administered 2020-12-14: 110 mg via INTRAVENOUS

## 2020-12-14 MED ORDER — LIDOCAINE HCL (CARDIAC) PF 100 MG/5ML IV SOSY
PREFILLED_SYRINGE | INTRAVENOUS | Status: DC | PRN
  Administered 2020-12-14: 80 mg via INTRAVENOUS

## 2020-12-14 MED ORDER — NITROGLYCERIN 0.4 MG SL SUBL: 0.4000 mg | SUBLINGUAL_TABLET | SUBLINGUAL | Status: DC | PRN

## 2020-12-14 MED ORDER — FENTANYL CITRATE (PF) 100 MCG/2ML IJ SOLN
INTRAMUSCULAR | Status: AC
  Filled 2020-12-14: qty 2

## 2020-12-14 MED ORDER — CLOPIDOGREL BISULFATE 75 MG PO TABS
75.0000 mg | ORAL_TABLET | Freq: Every day | ORAL | Status: DC
  Administered 2020-12-15: 75 mg via ORAL
  Filled 2020-12-14 (×2): qty 1

## 2020-12-14 MED ORDER — LABETALOL HCL 5 MG/ML IV SOLN: 10.0000 mg | INTRAVENOUS | Status: DC | PRN

## 2020-12-14 MED ORDER — FENTANYL CITRATE (PF) 100 MCG/2ML IJ SOLN
25.0000 ug | INTRAMUSCULAR | Status: DC | PRN
Start: 2020-12-14 — End: 2020-12-14
  Administered 2020-12-14 (×4): 25 ug via INTRAVENOUS

## 2020-12-14 MED ORDER — HYDRALAZINE HCL 20 MG/ML IJ SOLN: 5.0000 mg | INTRAMUSCULAR | Status: DC | PRN

## 2020-12-14 MED ORDER — FENTANYL CITRATE (PF) 100 MCG/2ML IJ SOLN
INTRAMUSCULAR | Status: DC | PRN
  Administered 2020-12-14: 50 ug via INTRAVENOUS

## 2020-12-14 MED ORDER — PHENOL 1.4 % MT LIQD
1.0000 | OROMUCOSAL | Status: DC | PRN
  Filled 2020-12-14: qty 177

## 2020-12-14 MED ORDER — FAMOTIDINE 20 MG PO TABS: 20.0000 mg | ORAL_TABLET | Freq: Once | ORAL | Status: DC

## 2020-12-14 MED ORDER — HEPARIN SODIUM (PORCINE) 1000 UNIT/ML IJ SOLN
INTRAMUSCULAR | Status: DC | PRN
Start: 2020-12-14 — End: 2020-12-14
  Administered 2020-12-14: 5000 [IU] via INTRAVENOUS

## 2020-12-14 SURGICAL SUPPLY — 27 items
CANNULA 5F STIFF (CANNULA) ×2 IMPLANT
CATH ACCU-VU SIZ PIG 5F 70CM (CATHETERS) ×2 IMPLANT
CATH BALLN CODA 9X100X32 (BALLOONS) ×2 IMPLANT
CATH BEACON 5 .035 65 KMP TIP (CATHETERS) ×2 IMPLANT
CLOSURE PERCLOSE PROSTYLE (VASCULAR PRODUCTS) ×8 IMPLANT
COVER PROBE U/S 5X48 (MISCELLANEOUS) ×2 IMPLANT
DEVICE SAFEGUARD 24CM (GAUZE/BANDAGES/DRESSINGS) ×4 IMPLANT
DEVICE TORQUE .025-.038 (MISCELLANEOUS) ×2 IMPLANT
DRYSEAL FLEXSHEATH 12FR 33CM (SHEATH) ×1
DRYSEAL FLEXSHEATH 15FR 33CM (SHEATH) ×1
EXCLDR TRNK ENDO 23X12X12 15F (Endovascular Graft) ×2 IMPLANT
EXCLUDER TNK END 23X12X12 15F (Endovascular Graft) ×1 IMPLANT
EXTENDER ENDOPROSTHESIS 10X7 (Endovascular Graft) ×2 IMPLANT
GLIDEWIRE STIFF .35X180X3 HYDR (WIRE) ×2 IMPLANT
LEG CONTRALATERAL 16X12X10 (Vascular Products) ×1 IMPLANT
PACK ANGIOGRAPHY (CUSTOM PROCEDURE TRAY) ×2 IMPLANT
SHEATH BRITE TIP 6FRX11 (SHEATH) ×4 IMPLANT
SHEATH BRITE TIP 6FRX5.5 (SHEATH) IMPLANT
SHEATH BRITE TIP 8FRX11 (SHEATH) ×4 IMPLANT
SHEATH DRYSEAL FLEX 12FR 33CM (SHEATH) ×1 IMPLANT
SHEATH DRYSEAL FLEX 15FR 33CM (SHEATH) ×1 IMPLANT
STENT GRAFT CONTRALAT 16X12X10 (Vascular Products) ×1 IMPLANT
SYR MEDRAD MARK 7 150ML (SYRINGE) ×2 IMPLANT
TOWEL OR 17X26 4PK STRL BLUE (TOWEL DISPOSABLE) ×4 IMPLANT
TUBING CONTRAST HIGH PRESS 48 (TUBING) ×6 IMPLANT
WIRE AMPLATZ SSTIFF .035X260CM (WIRE) ×6 IMPLANT
WIRE GUIDERIGHT .035X150 (WIRE) ×4 IMPLANT

## 2020-12-14 NOTE — Progress Notes (Signed)
Patient's head of bed raised to 30 degrees.

## 2020-12-14 NOTE — Transfer of Care (Signed)
Immediate Anesthesia Transfer of Care Note  Patient: Melissa Montes  Procedure(s) Performed: ENDOVASCULAR REPAIR/STENT GRAFT (Bilateral )  Patient Location: PACU  Anesthesia Type:General  Level of Consciousness: awake, drowsy and patient cooperative  Airway & Oxygen Therapy: Patient Spontanous Breathing  Post-op Assessment: Report given to RN and Post -op Vital signs reviewed and stable  Post vital signs: Reviewed and stable  Last Vitals:  Vitals Value Taken Time  BP 94/63 12/14/20 0943  Temp    Pulse 78 12/14/20 0946  Resp 13 12/14/20 0946  SpO2 100 % 12/14/20 0946  Vitals shown include unvalidated device data.  Last Pain:  Vitals:   12/14/20 0646  TempSrc: Oral  PainSc: 5          Complications: No complications documented.

## 2020-12-14 NOTE — Anesthesia Procedure Notes (Signed)
Procedure Name: Intubation Date/Time: 12/14/2020 8:01 AM Performed by: Lowry Bowl, CRNA Pre-anesthesia Checklist: Patient identified, Emergency Drugs available, Suction available and Patient being monitored Patient Re-evaluated:Patient Re-evaluated prior to induction Oxygen Delivery Method: Circle system utilized Preoxygenation: Pre-oxygenation with 100% oxygen Induction Type: IV induction and Cricoid Pressure applied Ventilation: Mask ventilation without difficulty Laryngoscope Size: Mac, 3 and McGraph Grade View: Grade II Tube type: Oral Tube size: 6.5 mm Number of attempts: 1 Airway Equipment and Method: Stylet and Video-laryngoscopy Placement Confirmation: ETT inserted through vocal cords under direct vision,  positive ETCO2 and breath sounds checked- equal and bilateral Secured at: 21 cm Tube secured with: Tape Dental Injury: Teeth and Oropharynx as per pre-operative assessment  Difficulty Due To: Difficulty was anticipated, Difficult Airway- due to limited oral opening and Difficult Airway- due to anterior larynx

## 2020-12-14 NOTE — H&P (Signed)
[facility] Admission History & Physical  MRN : [id number]  Melissa Montes is a 65 y.o. ([birth year]) female who presents with chief complaint of No chief complaint on file. Marland Kitchen  History of Present Illness: Patient presents today for repair of her AAA. She has undergone a CT angiogram. I have independently reviewed the patient's CT angiogram of the abdomen and pelvis.  The official report is of a 4.4 cm infrarenal abdominal aortic aneurysm.  I would interpret this differently.  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  On image 85 of 206 in the axial scan, this clearly measures approximately 4.7 cm in transverse diameter.  This would be the largest diameter of the aneurysm.  This would also represent an approximately 7 mm growth in the last 9-10 months as it was measured at 4 cm about a year ago.  Current Facility-Administered Medications  Medication Dose Route Frequency Provider Last Rate Last Admin  . chlorhexidine (PERIDEX) 0.12 % solution 15 mL  15 mL Mouth/Throat Once Piscitello, Precious Haws, MD       Or  . MEDLINE mouth rinse  15 mL Mouth Rinse Once Piscitello, Precious Haws, MD      . Chlorhexidine Gluconate Cloth 2 % PADS 6 each  6 each Topical Once Kris Hartmann, NP       And  . Chlorhexidine Gluconate Cloth 2 % PADS 6 each  6 each Topical Once Kris Hartmann, NP      . lactated ringers infusion   Intravenous Continuous Piscitello, Precious Haws, MD      . vancomycin (VANCOCIN) IVPB 1000 mg/200 mL premix  1,000 mg Intravenous On Call to [place], NP        Past Medical History:  Diagnosis Date  . ADD (attention deficit disorder)   . Anxiety   . Aortic aneurysm ([place])   . Atherosclerosis of abdominal aorta ([place])   . Bipolar disorder ([place])   . Bulging of lumbar intervertebral disc   . CAD (coronary artery disease)   .  Cancer ([place])   . COPD (chronic obstructive pulmonary disease) ([place])   . Depression   . Diverticulitis   . Dyspnea   . History of kidney stones   . Hyperlipidemia   . Hypertension   . Insomnia   . Lumbar spondylosis   . MI (myocardial infarction) ([place])    2007, 2019  . Migraine   . Osteoporosis   . PONV (postoperative nausea and vomiting)   . PVD (peripheral vascular disease) ([place])   . 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 SVT (supraventricular tachycardia) ([facility])     Past Surgical History:  Procedure Laterality Date  . ABDOMINAL HYSTERECTOMY    . CAROTID ANGIOGRAPHY N/A 01/13/2018   Procedure: CAROTID ANGIOGRAPHY;  Surgeon: Algernon Huxley, MD;  Location: [facility];  Service: Cardiovascular;  Laterality: N/A;  . COLONOSCOPY    . CORONARY ANGIOPLASTY WITH STENT PLACEMENT    . iv INJECTIONS TO BACK    . LEFT HEART CATH Right 07/07/2018   Procedure: Left Heart Cath and Coronary Angiography;  Surgeon: Dionisio David, MD;  Location: [facility];  Service: Cardiovascular;  Laterality: Right;     Social History   Tobacco Use  . Smoking status:  Former Smoker    Packs/day: 1.00    Years: 41.00    Pack years: 41.00    Types: Cigarettes    Quit date: 09/19/2018    Years since quitting: 2.2  . Smokeless tobacco: Never Used  . Tobacco comment: uses nicotine gums  Substance Use Topics  . Alcohol use: No  . Drug use: No     Family History  Problem Relation Age of Onset  . Breast cancer Mother 54  . Breast cancer Maternal Aunt        mat aunt and great aunt  . Depression Sister     Allergies  Allergen Reactions  . Effexor Xr [Venlafaxine Hcl]     suicidality  . Penicillins Swelling  . Amoxicillin-Pot Clavulanate   . Keflex [Cephalexin] Hives     REVIEW OF SYSTEMS(Negative unless checked)  Constitutional: [] ??????Weight  loss[] ??????Fever[] ??????Chills Cardiac:[] ??????Chest pain[] ??????Chest pressure[x] ??????Palpitations [] ??????Shortness of breath when laying flat [] ??????Shortness of breath at rest [x] ??????Shortness of breath with exertion. Vascular: [] ??????Pain in legs with walking[] ??????Pain in legsat rest[] ??????Pain in legs when laying flat [x] ??????Claudication [] ??????Pain in feet when walking [] ??????Pain in feet at rest [] ??????Pain in feet when laying flat [] ??????History of DVT [] ??????Phlebitis [] ??????Swelling in legs [] ??????Varicose veins [] ??????Non-healing ulcers Pulmonary: [] ??????Uses home oxygen [] ??????Productive cough[] ??????Hemoptysis [] ??????Wheeze [] ??????COPD [] ??????Asthma Neurologic: [x] ??????Dizziness [] ??????Blackouts [] ??????Seizures [] ??????History of stroke [] ??????History of TIA[] ??????Aphasia [] ??????Temporary blindness[] ??????Dysphagia [] ??????Weaknessor numbness in arms [] ??????Weakness or numbnessin legs Musculoskeletal: [] ??????Arthritis [] ??????Joint swelling [] ??????Joint pain [] ??????Low back pain Hematologic:[] ??????Easy bruising[] ??????Easy bleeding [] ??????Hypercoagulable state [] ??????Anemic  Gastrointestinal:[] ??????Blood in stool[] ??????Vomiting blood[x] ??????Gastroesophageal reflux/heartburn[] ??????Abdominal pain Genitourinary: [] ??????Chronic kidney disease [] ??????Difficulturination [] ??????Frequenturination [] ??????Burning with urination[] ??????Hematuria Skin: [] ??????Rashes [] ??????Ulcers [] ??????Wounds Psychological: [x] ??????History of anxiety[x] ??????History of major depression.   Physical Examination  Vitals:   12/14/20 0646  BP: 97/69  Pulse: 66  Resp: 20  Temp: 98 F (36.7 C)  TempSrc: Oral  Weight: 45.4 kg  Height: 5\' 1"  (1.549 m)   Body mass index is 18.89 kg/m. Gen: Thin, NAD Head: [place]/AT, No temporalis wasting.   Ear/Nose/Throat: Hearing grossly intact, nares w/o erythema or drainage, oropharynx w/o Erythema/Exudate,  Eyes: Conjunctiva clear, sclera non-icteric Neck: Trachea midline.  No JVD.  Pulmonary:  Good air movement, respirations not labored, no use of accessory muscles.  Cardiac: RRR, normal S1, S2. Vascular:  Vessel Right Left  Radial Palpable Palpable                          PT Palpable Palpable  DP Palpable Palpable   Gastrointestinal: soft, non-tender/non-distended. No guarding/reflex. Increased aortic impulse Musculoskeletal: M/S 5/5 throughout.  Extremities without ischemic changes.  No deformity or atrophy.  Neurologic: Sensation grossly intact in extremities.  Symmetrical.  Speech is fluent. Motor exam as listed above. Psychiatric: Judgment intact, Mood & affect appropriate for pt's clinical situation. Somewhat anxious Dermatologic: No rashes or ulcers noted.  No cellulitis or open wounds.      CBC Lab Results  Component Value Date   WBC 11.1 (H) 12/12/2020   HGB 14.6 12/12/2020   HCT 43.2 12/12/2020   MCV 88.0 12/12/2020   PLT 298 12/12/2020    BMET    Component Value Date/Time   NA 142 12/12/2020 1112   K 4.3 12/12/2020 1112   CL 105 12/12/2020 1112   CO2 27 12/12/2020 1112   GLUCOSE 110 (H) 12/12/2020 1112   BUN 15 12/12/2020 1112   CREATININE 0.70 12/12/2020 1112   CALCIUM 9.4 12/12/2020 1112   GFRNONAA >60 12/12/2020 1112   GFRAA >60 07/17/2020 0855  Estimated Creatinine Clearance: 50.9 mL/min (by C-G formula based on SCr of 0.7 mg/dL).  COAG Lab Results  Component Value Date   INR 1.0 12/12/2020    Radiology No results found.   Assessment/Plan Pure hypercholesterolemia lipid control important in reducing the progression of atherosclerotic disease. Continue statin therapy   Benign essential hypertension blood pressure control important in reducing the progression of atherosclerotic disease. On appropriate oral  medications.   Bilateral carotid artery stenosis Mild at last check, checking annually  AAA (abdominal aortic aneurysm) without rupture ([place]) I have independently reviewed the patient's CT angiogram of the abdomen and pelvis.  The official report is of a 4.4 cm infrarenal abdominal aortic aneurysm.  I would interpret this differently.  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  On image 85 of 206 in the axial scan, this clearly measures approximately 4.7 cm in transverse diameter.  This would be the largest diameter of the aneurysm.  This would also represent about a 7 mm growth in the last 9-10 months.  In addition, she is a very small woman and the normal size of her aorta at the level of the renals is only in the range of 17 to 18 mm.  An aneurysm of this size on her, carries with it a more significant risk than on a larger individual.  As such, I think proceeding with repair is reasonable.  She is very desirous to proceed with repair and is very anxious about this aneurysm.  The risks and the benefits of the endovascular aneurysm repair were discussed with the patient in detail.  She is agreeable to proceed.   Leotis Pain, MD  12/14/2020 7:36 AM

## 2020-12-14 NOTE — Op Note (Signed)
OPERATIVE NOTE   PROCEDURE: 1. US guidance for vascular access, bilateral femoral arteries 2. Catheter placement into aorta from bilateral femoral approaches 3. Placement of a 23 mm proximal, 12 cm length conformable Gore Excluder Endoprosthesis main body left with a 12 mm x 10 cm right contralateral limb 4. Placement of a 10 mm x 7 cm left iliac extension limb 5. ProGlide closure devices bilateral femoral arteries  PRE-OPERATIVE DIAGNOSIS: AAA  POST-OPERATIVE DIAGNOSIS: same  SURGEON: Leotis Pain, MD   ANESTHESIA: general  ESTIMATED BLOOD LOSS: 25 cc  FINDING(S): 1.  AAA  SPECIMEN(S):  none  INDICATIONS:   Melissa Montes is a 65 y.o. female who presents with an enlarging AAA nearing 5 cm in size. The anatomy was suitable for endovascular repair.  Risks and benefits of repair in an endovascular fashion were discussed and informed consent was obtained. Co-surgeons are used to expedite the procedure and reduce operative time as bilateral work needs to be done.  DESCRIPTION: After obtaining full informed written consent, the patient was brought back to the operating room and placed supine upon the operating table.  The patient received IV antibiotics prior to induction.  After obtaining adequate anesthesia, the patient was prepped and draped in the standard fashion for endovascular AAA repair.  I then began by gaining access to both femoral arteries with US guidance.  The femoral arteries were found to be patent and accessed without difficulty with a needle under ultrasound guidance without difficulty on each side and permanent images were recorded.  We then placed 2 proglide devices on each side in a pre-close fashion and placed 8 French sheaths. The patient was then given 5000 units of intravenous heparin. The Pigtail catheter was placed into the aorta from the right side. Using this image, we selected a 23 mm proximal 12 cm length Main body device.  Over a stiff wire, an 15  French sheath was placed up the left. The main body was then placed through the 15 French sheath. A Kumpe catheter was placed up the right side and a magnified image at the renal arteries was performed. The main body was then deployed just below the lowest renal artery. The Kumpe catheter was used to cannulate the contralateral gate without difficulty and successful cannulation was confirmed by twirling the pigtail catheter in the main body. We then placed a stiff wire and a retrograde arteriogram was performed through the right femoral sheath. We upsized to the 12 [place] sheath on the right side for the contralateral limb and a 12 mm diameter x 10 cm length limb was selected and deployed. The main body deployment was then completed. Based off the angiographic findings, extension limbs were necessary.  A 10 mm diameter x 7 cm length left iliac extension limb was deployed down to just above the left hypogastric aretry. All junction points and seals zones were treated with the compliant balloon. The pigtail catheter was then replaced and a completion angiogram was performed.  A Type II Endoleak was detected on completion angiography. The renal arteries were found to be widely patent. Both hypogastric arteries had flow as well . At this point we elected to terminate the procedure. We secured the pro glide devices for hemostasis on the femoral arteries. The skin incision was closed with a 4-0 Monocryl. Dermabond and pressure dressing were placed. The patient was taken to the recovery room in stable condition having tolerated the procedure well.  COMPLICATIONS: none  CONDITION: stable  Leotis Pain  12/14/2020, 9:39 AM   This note was created with Dragon Medical transcription system. Any errors in dictation are purely unintentional.

## 2020-12-14 NOTE — Progress Notes (Signed)
Patient states her normal BP is 14E systolic. Notified Dr. Lucky Cowboy and he states that dopamine does not need to be started unless systolic BP is less than 90.

## 2020-12-14 NOTE — Plan of Care (Signed)
Patient is in the process of being educated.

## 2020-12-14 NOTE — Anesthesia Preprocedure Evaluation (Signed)
Anesthesia Evaluation  Patient identified by MRN, date of birth, ID band Patient awake    Reviewed: Allergy & Precautions, H&P , NPO status , Patient's Chart, lab work & pertinent test results, reviewed documented beta blocker date and time   History of Anesthesia Complications (+) PONV and history of anesthetic complications  Airway Mallampati: II  TM Distance: >3 FB Neck ROM: full  Mouth opening: Limited Mouth Opening  Dental  (+) Dental Advidsory Given, Partial Upper, Missing   Pulmonary shortness of breath and with exertion, neg sleep apnea, COPD,  COPD inhaler, neg recent URI, former smoker,    Pulmonary exam normal breath sounds clear to auscultation       Cardiovascular Exercise Tolerance: Good hypertension, (-) angina+ CAD, + Past MI, + CABG and + Peripheral Vascular Disease  (-) Cardiac Stents Normal cardiovascular exam(-) dysrhythmias (-) Valvular Problems/Murmurs Rhythm:regular Rate:Normal     Neuro/Psych PSYCHIATRIC DISORDERS Anxiety Depression Bipolar Disorder negative neurological ROS     GI/Hepatic negative GI ROS, Neg liver ROS,   Endo/Other  negative endocrine ROS  Renal/GU Renal disease (kidney stone)  negative genitourinary   Musculoskeletal   Abdominal   Peds  Hematology negative hematology ROS (+)   Anesthesia Other Findings Past Medical History: No date: ADD (attention deficit disorder) No date: Anxiety No date: Aortic aneurysm ([place]) No date: Atherosclerosis of abdominal aorta ([facility]) No date: Bipolar disorder ([place]) No date: Bulging of lumbar intervertebral disc No date: CAD (coronary artery disease) No date: Cancer ([place]) No date: COPD (chronic obstructive pulmonary disease) ([facility]) No date: Depression No date: Diverticulitis No date: Dyspnea No date: History of kidney stones No date: Hyperlipidemia No date: Hypertension No date: Insomnia No date: Lumbar spondylosis No date: MI  (myocardial infarction) ([place])     Comment:  2007, 2019 No date: Migraine No date: Osteoporosis No date: PONV (postoperative nausea and vomiting) No date: PVD (peripheral vascular disease) ([facility]) No date: 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[place]) 07/16/2018: Status post double vessel coronary artery bypass No date: SVT (supraventricular tachycardia) ([facility])   Reproductive/Obstetrics negative OB ROS                             Anesthesia Physical Anesthesia Plan  ASA: III  Anesthesia Plan: General   Post-op Pain Management:    Induction:   PONV Risk Score and Plan: 4 or greater and Ondansetron, Dexamethasone, Midazolam, Promethazine and Treatment may vary due to age or medical condition  Airway Management Planned:   Additional Equipment:   Intra-op Plan:   Post-operative Plan:   Informed Consent: I have reviewed the patients History and Physical, chart, labs and discussed the procedure including the risks, benefits and alternatives for the proposed anesthesia with the patient or authorized representative who has indicated his/her understanding and acceptance.     Dental Advisory Given  Plan Discussed with: Anesthesiologist, CRNA and Surgeon  Anesthesia Plan Comments:         Anesthesia Quick Evaluation

## 2020-12-15 ENCOUNTER — Telehealth: Payer: Medicaid Other | Admitting: Psychiatry

## 2020-12-15 ENCOUNTER — Encounter: Payer: Self-pay | Admitting: Vascular Surgery

## 2020-12-15 DIAGNOSIS — I714 Abdominal aortic aneurysm, without rupture: Principal | ICD-10-CM

## 2020-12-15 LAB — BASIC METABOLIC PANEL
Anion gap: 7 (ref 5–15)
BUN: 15 mg/dL (ref 8–23)
CO2: 23 mmol/L (ref 22–32)
Calcium: 8.1 mg/dL — ABNORMAL LOW (ref 8.9–10.3)
Chloride: 112 mmol/L — ABNORMAL HIGH (ref 98–111)
Creatinine, Ser: 0.62 mg/dL (ref 0.44–1.00)
GFR, Estimated: >60 mL/min (ref >60)
Glucose, Bld: 131 mg/dL — ABNORMAL HIGH (ref 70–99)
Potassium: 3.8 mmol/L (ref 3.5–5.1)
Sodium: 142 mmol/L (ref 135–145)

## 2020-12-15 LAB — CBC
HCT: 34.1 % — ABNORMAL LOW (ref 36.0–46.0)
Hemoglobin: 11.4 g/dL — ABNORMAL LOW (ref 12.0–15.0)
MCH: 29.5 pg (ref 26.0–34.0)
MCHC: 33.4 g/dL (ref 30.0–36.0)
MCV: 88.3 fL (ref 80.0–100.0)
Platelets: 214 10*3/uL (ref 150–400)
RBC: 3.86 MIL/uL — ABNORMAL LOW (ref 3.87–5.11)
RDW: 13.2 % (ref 11.5–15.5)
WBC: 16.5 10*3/uL — ABNORMAL HIGH (ref 4.0–10.5)
nRBC: 0 % (ref 0.0–0.2)

## 2020-12-15 MED ORDER — CLOPIDOGREL BISULFATE 75 MG PO TABS: 75.0000 mg | ORAL_TABLET | Freq: Every day | ORAL | 3 refills | Status: DC

## 2020-12-15 MED ORDER — OXYCODONE-ACETAMINOPHEN 5-325 MG PO TABS
ORAL_TABLET | ORAL | Status: AC
  Administered 2020-12-15: 2 via ORAL
  Filled 2020-12-15: qty 2

## 2020-12-15 MED ORDER — VANCOMYCIN HCL IN DEXTROSE 1-5 GM/200ML-% IV SOLN
INTRAVENOUS | Status: AC
  Filled 2020-12-15: qty 200

## 2020-12-15 NOTE — Discharge Instructions (Signed)
1) you may shower as of tomorrow.  Please keep your groins clean and dry. 2) no heavy lifting greater than 10 pounds or engaging in strenuous activity until you are cleared at your first postoperative visit.

## 2020-12-15 NOTE — Progress Notes (Signed)
Nsg Discharge Note  Admit Date:  12/14/2020 Discharge date: 12/15/2020   Dewana Ammirati Offenberger to be D/C'd Home per MD order.  AVS completed.   Patient/caregiver able to verbalize understanding.  Discharge Medication: Allergies as of 12/15/2020      Reactions   Effexor Xr [venlafaxine Hcl]    suicidality   Penicillins Swelling   Amoxicillin-pot Clavulanate    Keflex [cephalexin] Hives      Medication List    TAKE these medications   aspirin EC 81 MG tablet Take 162 mg by mouth every evening.   atorvastatin 80 MG tablet Commonly known as: LIPITOR Take 80 mg by mouth every evening.   clopidogrel 75 MG tablet Commonly known as: PLAVIX Take 1 tablet (75 mg total) by mouth daily.   DULoxetine 30 MG capsule Commonly known as: CYMBALTA TAKE 1 CAPSULE(30 MG) BY MOUTH DAILY What changed: See the new instructions.   ezetimibe 10 MG tablet Commonly known as: ZETIA Take 10 mg by mouth daily.   fluticasone 50 MCG/ACT nasal spray Commonly known as: FLONASE Place 1 spray into both nostrils daily as needed for allergies.   gabapentin 100 MG capsule Commonly known as: NEURONTIN Take 1 capsule (100 mg total) by mouth 2 (two) times daily. Mood   hydrOXYzine 25 MG capsule Commonly known as: Vistaril Take 1 capsule (25 mg total) by mouth as directed. Take 1 capsule daily as needed for anxiety and 2 capsules at bedtime as needed for sleep What changed:   how much to take  when to take this  additional instructions   meclizine 25 MG tablet Commonly known as: ANTIVERT Take 25 mg by mouth as needed for dizziness.   metoprolol succinate 50 MG 24 hr tablet Commonly known as: TOPROL-XL Take 50 mg by mouth daily.   multivitamin tablet Take 1 tablet by mouth daily. Centrum Silver for Women   nitroGLYCERIN 0.4 MG SL tablet Commonly known as: NITROSTAT Place 0.4 mg under the tongue every 5 (five) minutes x 3 doses as needed for chest pain.   PROBIOTIC DAILY PO Take 1 capsule by  mouth daily.   promethazine 25 MG tablet Commonly known as: PHENERGAN Take 25 mg by mouth every 8 (eight) hours as needed for nausea or vomiting.   traMADol HCl 100 MG Tabs Take 1.5 tablets by mouth 2 (two) times daily as needed. What changed: reasons to take this   Vitamin D3 1.25 MG (50000 UT) Caps Take 1 tablet by mouth once a week.       Discharge Assessment: Vitals:   12/15/20 1130 12/15/20 1315  BP: 127/66 115/90  Pulse: 77 75  Resp: 13 14  Temp:  98.2 F (36.8 C)  SpO2: 98%    Skin clean, dry and intact without evidence of skin break down, no evidence of skin tears noted. IV catheter discontinued intact. Site without signs and symptoms of complications - no redness or edema noted at insertion site, patient denies c/o pain - only slight tenderness at site.  Dressing with slight pressure applied.  D/c Instructions-Education: Discharge instructions given to patient/family with verbalized understanding. D/c education completed with patient/family including follow up instructions, medication list, d/c activities limitations if indicated, with other d/c instructions as indicated by MD - patient able to verbalize understanding, all questions fully answered. Patient instructed to return to ED, call 911, or call MD for any changes in condition.  Patient escorted via [place], and D/C home via private auto.  Tresa Endo, RN 12/15/2020  1:18 PM

## 2020-12-15 NOTE — Discharge Summary (Signed)
[facility]    Discharge Summary  Patient ID:  Melissa Montes MRN: [id number] DOB/AGE: April 30, 1956 65 y.o.  Admit date: 12/14/2020 Discharge date: 12/15/2020 Date of Surgery: 12/14/2020 Surgeon: Surgeon(s): Algernon Huxley, MD  Admission Diagnosis: AAA (abdominal aortic aneurysm) without rupture [facility]) [I71.4]  Discharge Diagnoses:  AAA (abdominal aortic aneurysm) without rupture [facility]) [I71.4]  Secondary Diagnoses: Past Medical History:  Diagnosis Date  . ADD (attention deficit disorder)   . Anxiety   . Aortic aneurysm ([place])   . Atherosclerosis of abdominal aorta ([place])   . Bipolar disorder ([place])   . Bulging of lumbar intervertebral disc   . CAD (coronary artery disease)   . Cancer ([place])   . COPD (chronic obstructive pulmonary disease) ([place])   . Depression   . Diverticulitis   . Dyspnea   . History of kidney stones   . Hyperlipidemia   . Hypertension   . Insomnia   . Lumbar spondylosis   . MI (myocardial infarction) ([place])    2007, 2019  . Migraine   . Osteoporosis   . PONV (postoperative nausea and vomiting)   . PVD (peripheral vascular disease) ([place])   . 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 SVT (supraventricular tachycardia) ([place])    Procedure(s): 12/14/20: 1. US guidance for vascular access, bilateral femoral arteries 2. Catheter placement into aorta from bilateral femoral approaches 3. Placement of a 23 mm proximal, 12 cm length conformable Gore Excluder Endoprosthesis main body left with a 12 mm x 10 cm right contralateral limb 4. Placement of a 10 mm x 7 cm left iliac extension limb 5. ProGlide closure devices bilateral femoral arteries  Discharged Condition: Good  HPI / Hospital Course:  Melissa Montes is a 65 year old female who presents with an enlarging AAA nearing 5 cm in size. The anatomy was suitable for endovascular repair.  Risks and benefits of  repair in an endovascular fashion were discussed and informed consent was obtained. On 12/14/20, the patient underwent:  6. US guidance for vascular access, bilateral femoral arteries 7. Catheter placement into aorta from bilateral femoral approaches 8. Placement of a 23 mm proximal, 12 cm length conformable Gore Excluder Endoprosthesis main body left with a 12 mm x 10 cm right contralateral limb 9. Placement of a 10 mm x 7 cm left iliac extension limb 10. ProGlide closure devices bilateral femoral arteries  She tolerated the procedure well was transferred from the angiography suite to the recovery room for observation overnight.  Patient's not a procedure was unremarkable.  During her brief inpatient stay, her diet was advanced, her Foley was removed and she was urinating independently, her pain was controlled with the use of p.o. pain medication and she was ambulating at baseline.  Patient required dopamine briefly not a procedure.  Day of discharge the patient was afebrile with stable vital signs and essentially unremarkable physical exam.  Physical exam:  Alert and oriented x3, no acute distress Cardiovascular: Regular rate and rhythm Pulmonary: Clear to auscultation bilaterally Abdomen: Soft, nontender, nondistended Groin access site: Clean dry and intact Extremities: Warm distally to toes  Labs: As below  Complications: None  Consults: None  Significant Diagnostic Studies: CBC Lab Results  Component Value Date   WBC 16.5 (H) 12/15/2020   HGB 11.4 (L) 12/15/2020   HCT 34.1 (L) 12/15/2020   MCV 88.3 12/15/2020   PLT 214 12/15/2020   BMET  Component Value Date/Time   NA 142 12/15/2020 0314   K 3.8 12/15/2020 0314   CL 112 (H) 12/15/2020 0314   CO2 23 12/15/2020 0314   GLUCOSE 131 (H) 12/15/2020 0314   BUN 15 12/15/2020 0314   CREATININE 0.62 12/15/2020 0314   CALCIUM 8.1 (L) 12/15/2020 0314   GFRNONAA >60 12/15/2020 0314   GFRAA >60 07/17/2020 0855   COAG Lab  Results  Component Value Date   INR 1.0 12/12/2020   Disposition:  Discharge to :Home  Allergies as of 12/15/2020      Reactions   Effexor Xr [venlafaxine Hcl]    suicidality   Penicillins Swelling   Amoxicillin-pot Clavulanate    Keflex [cephalexin] Hives      Medication List    TAKE these medications   aspirin EC 81 MG tablet Take 162 mg by mouth every evening.   atorvastatin 80 MG tablet Commonly known as: LIPITOR Take 80 mg by mouth every evening.   clopidogrel 75 MG tablet Commonly known as: PLAVIX Take 1 tablet (75 mg total) by mouth daily.   DULoxetine 30 MG capsule Commonly known as: CYMBALTA TAKE 1 CAPSULE(30 MG) BY MOUTH DAILY What changed: See the new instructions.   ezetimibe 10 MG tablet Commonly known as: ZETIA Take 10 mg by mouth daily.   fluticasone 50 MCG/ACT nasal spray Commonly known as: FLONASE Place 1 spray into both nostrils daily as needed for allergies.   gabapentin 100 MG capsule Commonly known as: NEURONTIN Take 1 capsule (100 mg total) by mouth 2 (two) times daily. Mood   hydrOXYzine 25 MG capsule Commonly known as: Vistaril Take 1 capsule (25 mg total) by mouth as directed. Take 1 capsule daily as needed for anxiety and 2 capsules at bedtime as needed for sleep What changed:   how much to take  when to take this  additional instructions   meclizine 25 MG tablet Commonly known as: ANTIVERT Take 25 mg by mouth as needed for dizziness.   metoprolol succinate 50 MG 24 hr tablet Commonly known as: TOPROL-XL Take 50 mg by mouth daily.   multivitamin tablet Take 1 tablet by mouth daily. Centrum Silver for Women   nitroGLYCERIN 0.4 MG SL tablet Commonly known as: NITROSTAT Place 0.4 mg under the tongue every 5 (five) minutes x 3 doses as needed for chest pain.   PROBIOTIC DAILY PO Take 1 capsule by mouth daily.   promethazine 25 MG tablet Commonly known as: PHENERGAN Take 25 mg by mouth every 8 (eight) hours as needed  for nausea or vomiting.   traMADol HCl 100 MG Tabs Take 1.5 tablets by mouth 2 (two) times daily as needed. What changed: reasons to take this   Vitamin D3 1.25 MG (50000 UT) Caps Take 1 tablet by mouth once a week.      Verbal and written Discharge instructions given to the patient. Wound care per Discharge AVS  Follow-up Information    Dew, Erskine Squibb, MD Follow up in 1 month(s).   Specialties: Vascular Surgery, Radiology, Interventional Cardiology Why: Can see Dew or Arna Medici. Will need EVAR. Contact information: [place] [address] [phone]              Signed: Sela Hua, PA-C  12/15/2020, 12:48 PM

## 2020-12-15 NOTE — Progress Notes (Signed)
0750: 10 ml of air removed from bilateral PADs. Patient tolerated well. No new drainage/bleeding noted.   1941: 10 ml of air removed from bilateral PADs. Patient tolerated well. No new drainage/bleeding noted.   1055: 10 ml of air removed from bilateral PADs. Patient tolerated well. No new drainage/bleeding noted.   1156:10 ml of air removed from bilateral PADs. Patient tolerated well. No new drainage/bleeding noted.   1240: Bilateral PADs removed. Patient tolerated well. No new drainage or bleeding.

## 2020-12-15 NOTE — Anesthesia Postprocedure Evaluation (Signed)
Anesthesia Post Note  Patient: Melissa Montes  Procedure(s) Performed: ENDOVASCULAR REPAIR/STENT GRAFT (Bilateral )  Patient location during evaluation: Nursing Unit Anesthesia Type: General Level of consciousness: awake Pain management: pain level controlled Vital Signs Assessment: post-procedure vital signs reviewed and stable Respiratory status: spontaneous breathing Cardiovascular status: stable Anesthetic complications: no   No complications documented.   Last Vitals:  Vitals:   12/15/20 0615 12/15/20 0630  BP: (!) 96/57 (!) 97/56  Pulse: 61 65  Resp: 11 10  Temp:    SpO2: 96% 96%    Last Pain:  Vitals:   12/15/20 0607  TempSrc:   PainSc: 5                  Dawnn Nam Renee Ramus

## 2020-12-26 ENCOUNTER — Other Ambulatory Visit: Payer: Self-pay

## 2020-12-26 ENCOUNTER — Encounter: Payer: Self-pay | Admitting: Psychiatry

## 2020-12-26 ENCOUNTER — Telehealth (INDEPENDENT_AMBULATORY_CARE_PROVIDER_SITE_OTHER): Payer: Medicare HMO | Admitting: Psychiatry

## 2020-12-26 DIAGNOSIS — F3176 Bipolar disorder, in full remission, most recent episode depressed: Secondary | ICD-10-CM

## 2020-12-26 DIAGNOSIS — G4701 Insomnia due to medical condition: Secondary | ICD-10-CM

## 2020-12-26 DIAGNOSIS — R69 Illness, unspecified: Secondary | ICD-10-CM | POA: Diagnosis not present

## 2020-12-26 DIAGNOSIS — F411 Generalized anxiety disorder: Secondary | ICD-10-CM

## 2020-12-26 NOTE — Progress Notes (Unsigned)
Virtual Visit via Video Note  I connected with Melissa Montes on 12/26/20 at  2:40 PM EST by a video enabled telemedicine application and verified that I am speaking with the correct person using two identifiers.   Location Provider Location : [facility] Patient Location : Home  Participants: Patient , Provider  I discussed the limitations of evaluation and management by telemedicine and the availability of in person appointments. The patient expressed understanding and agreed to proceed.     I discussed the assessment and treatment plan with the patient. The patient was provided an opportunity to ask questions and all were answered. The patient agreed with the plan and demonstrated an understanding of the instructions.   The patient was advised to call back or seek an in-person evaluation if the symptoms worsen or if the condition fails to improve as anticipated.   Melissa Hondo MD OP Progress Note  12/26/2020 8:28 PM Melissa Montes  MRN:  [id number]  Chief Complaint:  Chief Complaint    Follow-up     HPI: Melissa Montes is a 65 year old Caucasian female on disability, divorced, lives in [place], has a history of bipolar disorder, GAD, coronary artery disease, abdominal aortic aneurysm, diverticulitis, chronic pain was evaluated by telemedicine today.  Patient today reports she recently underwent aortic aneurysm repair.  She reports she continues to be in pain however is managing it with tramadol.  She does have follow-up appointment scheduled with her surgeon.  Patient reports she has been noncompliant on her gabapentin since she felt it was not beneficial.  She however denies any side effects to the gabapentin.  She reports she is currently doing okay with regards to her mood.  Denies any mood swings, anxiety or crying spells.  She reports sleep is overall okay.  She however wonders whether she can go up on the hydroxyzine to 50 mg at bedtime as needed when she needs it.  She does  not take it every day.  Patient denies any suicidality, homicidality or perceptual disturbances.  Patient is compliant on the Cymbalta, denies side effects.  Patient denies any other concerns today.  Visit Diagnosis:    ICD-10-CM   1. GAD (generalized anxiety disorder)  F41.1   2. Bipolar disorder, in full remission, most recent episode depressed ([place])  F31.76   3. Insomnia due to medical condition  G47.01    pain    Past Psychiatric History: I have reviewed past psychiatric history from my progress note on 04/26/2020  Past Medical History:  Past Medical History:  Diagnosis Date  . ADD (attention deficit disorder)   . Anxiety   . Aortic aneurysm ([place])   . Atherosclerosis of abdominal aorta ([place])   . Bipolar disorder ([name])   . Bulging of lumbar intervertebral disc   . CAD (coronary artery disease)   . Cancer ([place])   . COPD (chronic obstructive pulmonary disease) ([place])   . Depression   . Diverticulitis   . Dyspnea   . History of kidney stones   . Hyperlipidemia   . Hypertension   . Insomnia   . Lumbar spondylosis   . MI (myocardial infarction) ([place])    2007, 2019  . Migraine   . Osteoporosis   . PONV (postoperative nausea and vomiting)   . PVD (peripheral vascular disease) ([place])   . 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 SVT (supraventricular tachycardia) [facility])     Past Surgical  History:  Procedure Laterality Date  . ABDOMINAL HYSTERECTOMY    . CAROTID ANGIOGRAPHY N/A 01/13/2018   Procedure: CAROTID ANGIOGRAPHY;  Surgeon: Algernon Huxley, MD;  Location: [facility];  Service: Cardiovascular;  Laterality: N/A;  . COLONOSCOPY    . CORONARY ANGIOPLASTY WITH STENT PLACEMENT    . ENDOVASCULAR REPAIR/STENT GRAFT Bilateral 12/14/2020   Procedure: ENDOVASCULAR REPAIR/STENT GRAFT;  Surgeon: Algernon Huxley, MD;  Location: [facility];  Service: Cardiovascular;  Laterality: Bilateral;  . iv  INJECTIONS TO BACK    . LEFT HEART CATH Right 07/07/2018   Procedure: Left Heart Cath and Coronary Angiography;  Surgeon: Dionisio David, MD;  Location: [facility];  Service: Cardiovascular;  Laterality: Right;    Family Psychiatric History: I have reviewed family psychiatric history from my progress note on 04/26/2020  Family History:  Family History  Problem Relation Age of Onset  . Breast cancer Mother 49  . Breast cancer Maternal Aunt        mat aunt and great aunt  . Depression Sister     Social History: Reviewed social history from my progress note on 04/26/2020 Social History   Socioeconomic History  . Marital status: Divorced    Spouse name: Not on file  . Number of children: Not on file  . Years of education: Not on file  . Highest education level: Not on file  Occupational History  . Occupation: disabled  Tobacco Use  . Smoking status: Former Smoker    Packs/day: 1.00    Years: 41.00    Pack years: 41.00    Types: Cigarettes    Quit date: 09/19/2018    Years since quitting: 2.2  . Smokeless tobacco: Never Used  . Tobacco comment: uses nicotine gums  Substance and Sexual Activity  . Alcohol use: No  . Drug use: No  . Sexual activity: Not on file  Other Topics Concern  . Not on file  Social History Narrative  . Not on file   Social Determinants of Health   Financial Resource Strain: Not on file  Food Insecurity: Not on file  Transportation Needs: Not on file  Physical Activity: Not on file  Stress: Not on file  Social Connections: Not on file    Allergies:  Allergies  Allergen Reactions  . Effexor Xr [Venlafaxine Hcl]     suicidality  . Penicillins Swelling  . Amoxicillin-Pot Clavulanate   . Keflex [Cephalexin] Hives    Metabolic Disorder Labs: No results found for: HGBA1C, MPG No results found for: PROLACTIN No results found for: CHOL, TRIG, HDL, CHOLHDL, VLDL, LDLCALC No results found for: TSH  Therapeutic Level Labs: No results  found for: LITHIUM No results found for: VALPROATE No components found for:  CBMZ  Current Medications: Current Outpatient Medications  Medication Sig Dispense Refill  . aspirin EC 81 MG tablet Take 162 mg by mouth every evening.     Marland Kitchen atorvastatin (LIPITOR) 80 MG tablet Take 80 mg by mouth every evening.     . Cholecalciferol (VITAMIN D3) 1.25 MG (50000 UT) CAPS Take 1 tablet by mouth once a week.    . clopidogrel (PLAVIX) 75 MG tablet Take 1 tablet (75 mg total) by mouth daily. 90 tablet 3  . DULoxetine (CYMBALTA) 30 MG capsule TAKE 1 CAPSULE(30 MG) BY MOUTH DAILY (Patient taking differently: Take 30 mg by mouth daily.) 30 capsule 1  . ezetimibe (ZETIA) 10 MG tablet Take 10 mg by mouth daily.     Marland Kitchen  fluticasone (FLONASE) 50 MCG/ACT nasal spray Place 1 spray into both nostrils daily as needed for allergies.    . hydrOXYzine (VISTARIL) 25 MG capsule Take 1 capsule (25 mg total) by mouth as directed. Take 1 capsule daily as needed for anxiety and 2 capsules at bedtime as needed for sleep (Patient taking differently: Take 50 mg by mouth See admin instructions. Take 1 capsule daily as needed for anxiety and 2 capsules at bedtime) 90 capsule 1  . meclizine (ANTIVERT) 25 MG tablet Take 25 mg by mouth as needed for dizziness.    . metoprolol succinate (TOPROL-XL) 50 MG 24 hr tablet Take 50 mg by mouth daily.    . Multiple Vitamin (MULTIVITAMIN) tablet Take 1 tablet by mouth daily. Centrum Silver for Women    . nitroGLYCERIN (NITROSTAT) 0.4 MG SL tablet Place 0.4 mg under the tongue every 5 (five) minutes x 3 doses as needed for chest pain.     . Probiotic Product (PROBIOTIC DAILY PO) Take 1 capsule by mouth daily.    . promethazine (PHENERGAN) 25 MG tablet Take 25 mg by mouth every 8 (eight) hours as needed for nausea or vomiting.    . traMADol HCl 100 MG TABS Take 1.5 tablets by mouth 2 (two) times daily as needed. (Patient taking differently: Take 1.5 tablets by mouth 2 (two) times daily as needed  (pain).) 90 tablet 2   No current facility-administered medications for this visit.     Musculoskeletal: Strength & Muscle Tone: UTA Gait & Station: UTA Patient leans: N/A  Psychiatric Specialty Exam: Review of Systems  Musculoskeletal:       S/P Aortic aneurysm repair - pain improving  Psychiatric/Behavioral: The patient is nervous/anxious.   All other systems reviewed and are negative.   There were no vitals taken for this visit.There is no height or weight on file to calculate BMI.  General Appearance: Casual  Eye Contact:  Fair  Speech:  Clear and Coherent  Volume:  Normal  Mood:  Anxious coping well  Affect:  Congruent  Thought Process:  Goal Directed and Descriptions of Associations: Intact  Orientation:  Full (Time, Place, and Person)  Thought Content: Logical   Suicidal Thoughts:  No  Homicidal Thoughts:  No  Memory:  Immediate;   Fair Recent;   Fair Remote;   Fair  Judgement:  Fair  Insight:  Fair  Psychomotor Activity:  Normal  Concentration:  Concentration: Fair and Attention Span: Fair  Recall:  [employer] of Knowledge: Fair  Language: Fair  Akathisia:  No  Handed:  Right  AIMS (if indicated): UTA  Assets:  Communication Skills Desire for Improvement Housing Social Support  ADL's:  Intact  Cognition: WNL  Sleep:  Fair   Screenings: [place] Office Visit from 06/28/2020 in [facility] Procedure visit from 04/11/2020 in [facility] Procedure visit from 03/14/2020 in [facility] from 11/20/2018 in [facility]  PHQ-2 Total Score 0 0 0 4  PHQ-9 Total Score - - - 13    Flowsheet Row Admission (Discharged) from 12/14/2020 in [facility] CATEGORY No Risk       Assessment and Plan: Melissa Montes is a 65 year old Caucasian female, disabled,  divorced, lives in [place], has a history of bipolar disorder, GAD, aortic aneurysm status post recent repair, diverticulitis, MI status post stent  placement, chronic pain was evaluated by telemedicine today.  Patient with anxiety symptoms however reports she is not interested in making medication changes and wants to give it more time on the current medication regimen.  Patient also has been noncompliant with gabapentin.  Plan as noted below.  Plan GAD-some progress Cymbalta 30 mg p.o. daily Discontinue gabapentin for noncompliance.  Patient agrees to Biochemist, clinical know if anxiety symptoms worsen and at that time could consider reinitiating gabapentin and gradually titrating the dosage. Hydroxyzine 25 mg p.o. daily as needed for severe anxiety and sleep.  Bipolar disorder in remission Monitor closely  Insomnia-improving Hydroxyzine 25 mg as needed for sleep.  Follow-up in clinic in 1 month or sooner if needed.  I have spent atleast 20 minutes face to face by video with patient today. More than 50 % of the time was spent for preparing to see the patient ( e.g., review of test, records ), , ordering medications and test ,psychoeducation and supportive psychotherapy and care coordination,as well as documenting clinical information in electronic health record. This note was generated in part or whole with voice recognition software. Voice recognition is usually quite accurate but there are transcription errors that can and very often do occur. I apologize for any typographical errors that were not detected and corrected.        Ursula Alert, MD 12/27/2020, 8:53 AM

## 2020-12-27 ENCOUNTER — Telehealth (INDEPENDENT_AMBULATORY_CARE_PROVIDER_SITE_OTHER): Payer: Self-pay

## 2020-12-27 ENCOUNTER — Encounter: Payer: Medicare HMO | Admitting: Student in an Organized Health Care Education/Training Program

## 2020-12-27 NOTE — Telephone Encounter (Signed)
Pt left a VM on the nurses line saying she had a procedure performed by Dr. Lucky Cowboy on 1/26 and the under the groin incisions she still feels as is something is present. The pt had a  Endo vascular stent repair. Please advise.

## 2020-12-27 NOTE — Telephone Encounter (Signed)
Dr. Lucky Cowboy uses a small device to help close the arteries following the procedure.  The device eventually dissolves but it is not uncommon to feel something in the groin for a few weeks.  Especially in [name]'s case because she's very petite.  It can remain for several weeks after the procedure

## 2020-12-28 NOTE — Telephone Encounter (Signed)
I called and left a VM for the pt with the Np's instructions.

## 2020-12-31 ENCOUNTER — Emergency Department
Admission: EM | Admit: 2020-12-31 | Discharge: 2020-12-31 | Disposition: A | Discharge status: Home / Self Care | Payer: Medicare HMO | Attending: Emergency Medicine | Admitting: Emergency Medicine

## 2020-12-31 ENCOUNTER — Other Ambulatory Visit: Payer: Self-pay

## 2020-12-31 ENCOUNTER — Emergency Department: Payer: Medicare HMO

## 2020-12-31 DIAGNOSIS — W19XXXA Unspecified fall, initial encounter: Secondary | ICD-10-CM | POA: Diagnosis not present

## 2020-12-31 DIAGNOSIS — Z859 Personal history of malignant neoplasm, unspecified: Secondary | ICD-10-CM | POA: Insufficient documentation

## 2020-12-31 DIAGNOSIS — Z951 Presence of aortocoronary bypass graft: Secondary | ICD-10-CM | POA: Insufficient documentation

## 2020-12-31 DIAGNOSIS — I2510 Atherosclerotic heart disease of native coronary artery without angina pectoris: Secondary | ICD-10-CM | POA: Insufficient documentation

## 2020-12-31 DIAGNOSIS — Z043 Encounter for examination and observation following other accident: Secondary | ICD-10-CM | POA: Diagnosis not present

## 2020-12-31 DIAGNOSIS — S0990XA Unspecified injury of head, initial encounter: Secondary | ICD-10-CM | POA: Insufficient documentation

## 2020-12-31 DIAGNOSIS — Z79899 Other long term (current) drug therapy: Secondary | ICD-10-CM | POA: Diagnosis not present

## 2020-12-31 DIAGNOSIS — J449 Chronic obstructive pulmonary disease, unspecified: Secondary | ICD-10-CM | POA: Diagnosis not present

## 2020-12-31 DIAGNOSIS — Z7902 Long term (current) use of antithrombotics/antiplatelets: Secondary | ICD-10-CM | POA: Diagnosis not present

## 2020-12-31 DIAGNOSIS — Z7982 Long term (current) use of aspirin: Secondary | ICD-10-CM | POA: Insufficient documentation

## 2020-12-31 DIAGNOSIS — Z87891 Personal history of nicotine dependence: Secondary | ICD-10-CM | POA: Insufficient documentation

## 2020-12-31 DIAGNOSIS — I10 Essential (primary) hypertension: Secondary | ICD-10-CM | POA: Diagnosis not present

## 2020-12-31 DIAGNOSIS — R42 Dizziness and giddiness: Secondary | ICD-10-CM | POA: Diagnosis not present

## 2020-12-31 LAB — CBC
HCT: 37.9 % (ref 36.0–46.0)
Hemoglobin: 12.5 g/dL (ref 12.0–15.0)
MCH: 29.3 pg (ref 26.0–34.0)
MCHC: 33 g/dL (ref 30.0–36.0)
MCV: 89 fL (ref 80.0–100.0)
Platelets: 363 10*3/uL (ref 150–400)
RBC: 4.26 MIL/uL (ref 3.87–5.11)
RDW: 13.3 % (ref 11.5–15.5)
WBC: 10.4 10*3/uL (ref 4.0–10.5)
nRBC: 0 % (ref 0.0–0.2)

## 2020-12-31 LAB — BASIC METABOLIC PANEL
Anion gap: 10 (ref 5–15)
BUN: 20 mg/dL (ref 8–23)
CO2: 25 mmol/L (ref 22–32)
Calcium: 9.1 mg/dL (ref 8.9–10.3)
Chloride: 103 mmol/L (ref 98–111)
Creatinine, Ser: 0.74 mg/dL (ref 0.44–1.00)
GFR, Estimated: >60 mL/min (ref >60)
Glucose, Bld: 111 mg/dL — ABNORMAL HIGH (ref 70–99)
Potassium: 4.8 mmol/L (ref 3.5–5.1)
Sodium: 138 mmol/L (ref 135–145)

## 2020-12-31 NOTE — ED Triage Notes (Signed)
Pt to ED POV for chief complaint of fall last night. Pt states recently started taking trazadone to help sleep but not sure what caused her to fall. +intermittent dizziness Pt does take blood thinners.  Recently had abdominal aneurysm repaired.  Pt in NAD, RR Even and unlabored

## 2020-12-31 NOTE — ED Provider Notes (Addendum)
[facility] Emergency Department Provider Note  ____________________________________________   Event Date/Time   First MD Initiated Contact with Patient 12/31/20 2034     (approximate)  I have reviewed the triage vital signs and the nursing notes.   HISTORY  Chief Complaint Fall    HPI Melissa Montes is a 65 y.o. female with history of aortic aneurysm, open heart surgery on Plavix who comes in for a fall.  Patient reports having intermittent dizziness for years.  She is having this followed by her primary care doctor and is going to see ENT.  She states that she had one of her typical dizzy episodes where she fell down and hit the back of her head behind her left ear.  She was having pain there that is moderate, constant, did not take anything to help the pain, nothing made it worse.  However she want to make sure there is no bleeding in her brain which is why she presented today.  Denies any chest pain, shortness of breath, abdominal pain.  Otherwise she feels that her baseline self and has been ambulatory since the fall.            Past Medical History:  Diagnosis Date  . ADD (attention deficit disorder)   . Anxiety   . Aortic aneurysm ([place])   . Atherosclerosis of abdominal aorta ([place])   . Bipolar disorder ([place])   . Bulging of lumbar intervertebral disc   . CAD (coronary artery disease)   . Cancer ([place])   . COPD (chronic obstructive pulmonary disease) ([place])   . Depression   . Diverticulitis   . Dyspnea   . History of kidney stones   . Hyperlipidemia   . Hypertension   . Insomnia   . Lumbar spondylosis   . MI (myocardial infarction) ([place])    2007, 2019  . Migraine   . Osteoporosis   . PONV (postoperative nausea and vomiting)   . PVD (peripheral vascular disease) ([place])   . 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 SVT (supraventricular tachycardia) [facility])      Patient Active Problem List   Diagnosis Date Noted  . Bipolar disorder, in full remission, most recent episode depressed ([place]) 10/03/2020  . Bilateral hip pain 06/28/2020  . Primary osteoarthritis of both hips 06/28/2020  . Chronic left shoulder pain 06/28/2020  . Right rotator cuff tear arthropathy 06/28/2020  . Left rotator cuff tear arthropathy 06/28/2020  . Lumbar degenerative disc disease 06/28/2020  . Bipolar disorder, current episode depressed, mild ([place]) 04/26/2020  . Chronic pain syndrome 02/16/2020  . Lumbar facet arthropathy 02/16/2020  . Unspecified injury of left internal jugular vein, subsequent encounter 05/15/2019  . Postsurgical percutaneous transluminal coronary angioplasty status 11/21/2018  . Osteoporosis, post-menopausal 11/05/2018  . Elevated alkaline phosphatase level 10/01/2018  . MGUS (monoclonal gammopathy of unknown significance) 10/01/2018  . S/P coronary artery bypass graft x 2 07/17/2018  . Unstable angina ([place]) 07/04/2018  . AAA (abdominal aortic aneurysm) without rupture ([place]) 01/31/2018  . Bilateral carotid artery stenosis 12/24/2017  . Personal history of tobacco use, presenting hazards to health 06/18/2017  . SVT (supraventricular tachycardia) ([place]) 06/18/2017  . Peripheral vascular disease ([place]) 06/18/2017  . Osteopenia 06/18/2017  . Oral herpes 06/18/2017  . COPD (chronic obstructive pulmonary disease) ([place]) 06/18/2017  . CAD (coronary artery disease) 06/18/2017  . Thrombosis of abdominal aorta ([place]) 06/18/2017  . ADD (attention  deficit disorder) 06/18/2017  . History of other disease of respiratory system 10/26/2016  . GAD (generalized anxiety disorder) 12/06/2015  . Angular cheilitis 10/18/2015  . S/P coronary artery stent placement 10/10/2015  . Pure hypercholesterolemia 10/10/2015  . Insomnia 10/10/2015  . Centrilobular emphysema ([place]) 10/10/2015  . Bipolar 1 disorder ([place]) 10/10/2015  . Syncope and collapse 03/24/2015  . Other  chronic hepatitis 03/04/2012  . Dizziness 01/03/2012  . Chest pain 01/03/2012  . Benign essential hypertension 11/15/2011  . Uninsured 08/11/2011  . Unemployed 08/11/2011  . Nephrolithiasis 08/11/2011  . Mixed hyperlipidemia 08/09/2011    Past Surgical History:  Procedure Laterality Date  . ABDOMINAL HYSTERECTOMY    . CAROTID ANGIOGRAPHY N/A 01/13/2018   Procedure: CAROTID ANGIOGRAPHY;  Surgeon: Algernon Huxley, MD;  Location: [place] [facility];  Service: Cardiovascular;  Laterality: N/A;  . COLONOSCOPY    . CORONARY ANGIOPLASTY WITH STENT PLACEMENT    . ENDOVASCULAR REPAIR/STENT GRAFT Bilateral 12/14/2020   Procedure: ENDOVASCULAR REPAIR/STENT GRAFT;  Surgeon: Algernon Huxley, MD;  Location: [place] [facility];  Service: Cardiovascular;  Laterality: Bilateral;  . iv INJECTIONS TO BACK    . LEFT HEART CATH Right 07/07/2018   Procedure: Left Heart Cath and Coronary Angiography;  Surgeon: Dionisio David, MD;  Location: [place] [facility];  Service: Cardiovascular;  Laterality: Right;    Prior to Admission medications   Medication Sig Start Date End Date Taking? Authorizing Provider  aspirin EC 81 MG tablet Take 162 mg by mouth every evening.     [provider]  atorvastatin (LIPITOR) 80 MG tablet Take 80 mg by mouth every evening.  08/30/16   [provider]  Cholecalciferol (VITAMIN D3) 1.25 MG (50000 UT) CAPS Take 1 tablet by mouth once a week. 03/18/20   [provider]  clopidogrel (PLAVIX) 75 MG tablet Take 1 tablet (75 mg total) by mouth daily. 12/15/20   Stegmayer, Janalyn Harder, PA-C  DULoxetine (CYMBALTA) 30 MG capsule TAKE 1 CAPSULE(30 MG) BY MOUTH DAILY Patient taking differently: Take 30 mg by mouth daily. 11/25/20   Ursula Alert, MD  ezetimibe (ZETIA) 10 MG tablet Take 10 mg by mouth daily.  09/24/16   [provider]  fluticasone (FLONASE) 50 MCG/ACT nasal spray Place 1 spray into both nostrils daily as needed for allergies. 07/21/20    [provider]  hydrOXYzine (VISTARIL) 25 MG capsule Take 1 capsule (25 mg total) by mouth as directed. Take 1 capsule daily as needed for anxiety and 2 capsules at bedtime as needed for sleep Patient taking differently: Take 50 mg by mouth See admin instructions. Take 1 capsule daily as needed for anxiety and 2 capsules at bedtime 10/05/20   Ursula Alert, MD  meclizine (ANTIVERT) 25 MG tablet Take 25 mg by mouth as needed for dizziness.    [provider]  metoprolol succinate (TOPROL-XL) 50 MG 24 hr tablet Take 50 mg by mouth daily. 04/08/17   [provider]  Multiple Vitamin (MULTIVITAMIN) tablet Take 1 tablet by mouth daily. Centrum Silver for Women    [provider]  nitroGLYCERIN (NITROSTAT) 0.4 MG SL tablet Place 0.4 mg under the tongue every 5 (five) minutes x 3 doses as needed for chest pain.  10/06/16   [provider]  Probiotic Product (PROBIOTIC DAILY PO) Take 1 capsule by mouth daily.    [provider]  promethazine (PHENERGAN) 25 MG tablet Take 25 mg by mouth every 8 (eight) hours as needed  for nausea or vomiting.    [provider]  traMADol HCl 100 MG TABS Take 1.5 tablets by mouth 2 (two) times daily as needed. Patient taking differently: Take 1.5 tablets by mouth 2 (two) times daily as needed (pain). 09/29/20 12/29/20  Gillis Santa, MD    Allergies Effexor xr [venlafaxine hcl], Penicillins, Amoxicillin-pot clavulanate, and Keflex [cephalexin]  Family History  Problem Relation Age of Onset  . Breast cancer Mother 36  . Breast cancer Maternal Aunt        mat aunt and great aunt  . Depression Sister     Social History Social History   Tobacco Use  . Smoking status: Former Smoker    Packs/day: 1.00    Years: 41.00    Pack years: 41.00    Types: Cigarettes    Quit date: 09/19/2018    Years since quitting: 2.2  . Smokeless tobacco: Never Used  . Tobacco comment: uses nicotine gums  Substance Use  Topics  . Alcohol use: No  . Drug use: No      Review of Systems Constitutional: No fever/chills, fall Eyes: No visual changes. ENT: No sore throat.  Hematoma noted Cardiovascular: Denies chest pain. Respiratory: Denies shortness of breath. Gastrointestinal: No abdominal pain.  No nausea, no vomiting.  No diarrhea.  No constipation. Genitourinary: Negative for dysuria. Musculoskeletal: Negative for back pain. Skin: Negative for rash. Neurological: Negative for headaches, focal weakness or numbness. All other ROS negative ____________________________________________   PHYSICAL EXAM:  VITAL SIGNS: ED Triage Vitals  Enc Vitals Group     BP 12/31/20 1733 114/70     Pulse Rate 12/31/20 1733 93     Resp 12/31/20 1733 16     Temp 12/31/20 1733 97.9 F (36.6 C)     Temp Source 12/31/20 1733 Oral     SpO2 12/31/20 1733 100 %     Weight 12/31/20 1733 100 lb (45.4 kg)     Height 12/31/20 1733 5\' 1"  (1.549 m)     Head Circumference --      Peak Flow --      Pain Score 12/31/20 1741 6     Pain Loc --      Pain Edu? --      Excl. in [place]? --     Constitutional: Alert and oriented. Well appearing and in no acute distress. Eyes: Conjunctivae are normal. EOMI. Head: Hematoma noted behind the left ear.  No auricular hematoma Nose: No congestion/rhinnorhea. Mouth/Throat: Mucous membranes are moist.   Neck: No stridor. Trachea Midline. FROM Cardiovascular: Normal rate, regular rhythm. Grossly normal heart sounds.  Good peripheral circulation. Respiratory: Normal respiratory effort.  No retractions. Lungs CTAB. Gastrointestinal: Soft and nontender. No distention. No abdominal bruits.  Musculoskeletal: No lower extremity tenderness nor edema.  No joint effusions. Neurologic:  Normal speech and language. No gross focal neurologic deficits are appreciated.  Skin:  Skin is warm, dry and intact. No rash noted. Psychiatric: Mood and affect are normal. Speech and behavior are normal. GU:  Deferred   ____________________________________________   LABS (all labs ordered are listed, but only abnormal results are displayed)  Labs Reviewed  BASIC METABOLIC PANEL - Abnormal; Notable for the following components:      Result Value   Glucose, Bld 111 (*)    All other components within normal limits  CBC  URINALYSIS, COMPLETE (UACMP) WITH MICROSCOPIC   ____________________________________________   ED ECG REPORT I, Vanessa [place], the attending physician, personally viewed and interpreted  this ECG.  Normal sinus rhythm 94, no ST elevation, no T wave inversions, short PR ____________________________________________  RADIOLOGY   Official radiology report(s): CT HEAD WO CONTRAST  Result Date: 12/31/2020 CLINICAL DATA:  Fall EXAM: CT HEAD WITHOUT CONTRAST CT CERVICAL SPINE WITHOUT CONTRAST TECHNIQUE: Multidetector CT imaging of the head and cervical spine was performed following the standard protocol without intravenous contrast. Multiplanar CT image reconstructions of the cervical spine were also generated. COMPARISON:  None. FINDINGS: CT HEAD FINDINGS Brain: No evidence of acute infarction, hemorrhage, hydrocephalus, extra-axial collection or mass lesion/mass effect. Vascular: No hyperdense vessel or unexpected calcification. Skull: Normal. Negative for fracture or focal lesion. Sinuses/Orbits: No acute finding. Other: None. CT CERVICAL SPINE FINDINGS Alignment: Normal. Skull base and vertebrae: No acute fracture. No primary bone lesion or focal pathologic process. Soft tissues and spinal canal: No prevertebral fluid or swelling. No visible canal hematoma. Disc levels: Focally moderate disc space height loss and osteophytosis of C6-C7. Otherwise intact. Upper chest: Emphysema. Other: None. IMPRESSION: 1. No acute intracranial pathology. 2. No fracture or static subluxation of the cervical spine. 3. Focally moderate disc space height loss and osteophytosis of C6-C7. Emphysema  (ICD10-J43.9). Electronically Signed   By: Eddie Candle M.D.   On: 12/31/2020 18:30   CT CERVICAL SPINE WO CONTRAST  Result Date: 12/31/2020 CLINICAL DATA:  Fall EXAM: CT HEAD WITHOUT CONTRAST CT CERVICAL SPINE WITHOUT CONTRAST TECHNIQUE: Multidetector CT imaging of the head and cervical spine was performed following the standard protocol without intravenous contrast. Multiplanar CT image reconstructions of the cervical spine were also generated. COMPARISON:  None. FINDINGS: CT HEAD FINDINGS Brain: No evidence of acute infarction, hemorrhage, hydrocephalus, extra-axial collection or mass lesion/mass effect. Vascular: No hyperdense vessel or unexpected calcification. Skull: Normal. Negative for fracture or focal lesion. Sinuses/Orbits: No acute finding. Other: None. CT CERVICAL SPINE FINDINGS Alignment: Normal. Skull base and vertebrae: No acute fracture. No primary bone lesion or focal pathologic process. Soft tissues and spinal canal: No prevertebral fluid or swelling. No visible canal hematoma. Disc levels: Focally moderate disc space height loss and osteophytosis of C6-C7. Otherwise intact. Upper chest: Emphysema. Other: None. IMPRESSION: 1. No acute intracranial pathology. 2. No fracture or static subluxation of the cervical spine. 3. Focally moderate disc space height loss and osteophytosis of C6-C7. Emphysema (ICD10-J43.9). Electronically Signed   By: Eddie Candle M.D.   On: 12/31/2020 18:30    ____________________________________________   PROCEDURES  Procedure(s) performed (including Critical Care):  Procedures   ____________________________________________   INITIAL IMPRESSION / ASSESSMENT AND PLAN / ED COURSE  Keala Drum Tarnowski was evaluated in Emergency Department on 12/31/2020 for the symptoms described in the history of present illness. She was evaluated in the context of the global COVID-19 pandemic, which necessitated consideration that the patient might be at risk for infection  with the SARS-CoV-2 virus that causes COVID-19. Institutional protocols and algorithms that pertain to the evaluation of patients at risk for COVID-19 are in a state of rapid change based on information released by regulatory bodies including the CDC and federal and state organizations. These policies and algorithms were followed during the patient's care in the ED.    Patient is a 65 year old who comes in with frequent episodes of dizziness but this time had a fall secondary to it.  Patient on Plavix.  Will get CT head evaluate for intracranial hemorrhage and CT cervical evaluate for cervical fracture labs to evaluate for Electra abnormalities.  Patient ports may be 1 second LOC and  EKG was ordered but no evidence of arrhythmia.  She has no chest chest pain although she has had a history of CABG.  She denies this feels like she had a heart attack.  She also denies any abdominal pain.  She has had recent aortic aneurysm repair but states that she is not having any recurrent pain in her abdomen is to suggest this being the cause of her dizziness.  She again reports that this dizziness is baseline for her and is being worked up outpatient by her primary care doctor and she does want to make sure that there was no bleeding in her brain.  CT scans were negative.  Patient declined pain medication at this time and would like to be discharged home  CT of the neck does show a little bit of focally moderate disc space height loss and osteophytosis of C6-C7.  Patient has no C-spine tenderness and no numbness or tingling on her arms.  Suspect that this is more likely chronic in nature not secondary to the fall.  She did not hit her neck it was the side of her head.  I did provide patient a copy of this report and she will follow up with her primary care doctor      ____________________________________________   FINAL CLINICAL IMPRESSION(S) / ED DIAGNOSES   Final diagnoses:  Fall, initial encounter  Injury of  head, initial encounter      MEDICATIONS GIVEN DURING THIS VISIT:  Medications - No data to display   ED Discharge Orders    None       Note:  This document was prepared using Dragon voice recognition software and may include unintentional dictation errors.   Vanessa [place], MD 12/31/20 2123    Vanessa [place], MD 12/31/20 2126

## 2020-12-31 NOTE — Discharge Instructions (Signed)
CT scan as below.  Work-up was reassuring.  Return to the ER if you develop chest pain, abdominal pain or any other concern.  You take Tylenol 1 g every 8 hours to help with pain   IMPRESSION: 1. No acute intracranial pathology. 2. No fracture or static subluxation of the cervical spine. 3. Focally moderate disc space height loss and osteophytosis of C6-C7.

## 2021-01-06 ENCOUNTER — Ambulatory Visit
Admission: RE | Admit: 2021-01-06 | Discharge: 2021-01-06 | Disposition: A | Discharge status: Home / Self Care | Payer: Medicare HMO | Source: Ambulatory Visit | Attending: Orthopedic Surgery | Admitting: Orthopedic Surgery

## 2021-01-06 ENCOUNTER — Other Ambulatory Visit: Payer: Self-pay

## 2021-01-06 DIAGNOSIS — M5136 Other intervertebral disc degeneration, lumbar region: Secondary | ICD-10-CM

## 2021-01-06 DIAGNOSIS — M533 Sacrococcygeal disorders, not elsewhere classified: Secondary | ICD-10-CM | POA: Insufficient documentation

## 2021-01-06 DIAGNOSIS — M47816 Spondylosis without myelopathy or radiculopathy, lumbar region: Secondary | ICD-10-CM | POA: Diagnosis not present

## 2021-01-06 DIAGNOSIS — M5126 Other intervertebral disc displacement, lumbar region: Secondary | ICD-10-CM | POA: Diagnosis not present

## 2021-01-10 ENCOUNTER — Other Ambulatory Visit (INDEPENDENT_AMBULATORY_CARE_PROVIDER_SITE_OTHER): Payer: Self-pay | Admitting: Vascular Surgery

## 2021-01-10 DIAGNOSIS — I714 Abdominal aortic aneurysm, without rupture, unspecified: Secondary | ICD-10-CM

## 2021-01-11 ENCOUNTER — Other Ambulatory Visit: Payer: Self-pay

## 2021-01-11 ENCOUNTER — Ambulatory Visit (INDEPENDENT_AMBULATORY_CARE_PROVIDER_SITE_OTHER): Payer: Medicare HMO | Admitting: Nurse Practitioner

## 2021-01-11 ENCOUNTER — Ambulatory Visit (INDEPENDENT_AMBULATORY_CARE_PROVIDER_SITE_OTHER): Payer: Medicare HMO

## 2021-01-11 VITALS — BP 121/70 | HR 86 | Ht 61.0 in | Wt 101.0 lb

## 2021-01-11 DIAGNOSIS — E7800 Pure hypercholesterolemia, unspecified: Secondary | ICD-10-CM

## 2021-01-11 DIAGNOSIS — I714 Abdominal aortic aneurysm, without rupture, unspecified: Secondary | ICD-10-CM

## 2021-01-11 DIAGNOSIS — I10 Essential (primary) hypertension: Secondary | ICD-10-CM

## 2021-01-15 ENCOUNTER — Encounter (INDEPENDENT_AMBULATORY_CARE_PROVIDER_SITE_OTHER): Payer: Self-pay | Admitting: Nurse Practitioner

## 2021-01-15 NOTE — Progress Notes (Signed)
Subjective:    Patient ID: Melissa Montes, female    DOB: 30-Aug-1956, 65 y.o.   MRN: [id number] Chief Complaint  Patient presents with  . Follow-up    1 Mo [facility] post [facility] endovascular  graft/stent  evar     The patient returns to the office for surveillance of an abdominal aortic aneurysm status post stent graft placement on 12/11/2020.   Patient denies abdominal pain or back pain, no other abdominal complaints. No groin related complaints. No symptoms consistent with distal embolization No changes in claudication distance.   There have been no interval changes in his overall healthcare since his last visit.   Patient denies amaurosis fugax or TIA symptoms. There is no history of claudication or rest pain symptoms of the lower extremities. The patient denies angina or shortness of breath.   Duplex US of the aorta and iliac arteries shows a 3.72 AAA sac with no endoleak, decrease in the sac compared to the previous study.   Review of Systems  All other systems reviewed and are negative.      Objective:   Physical Exam Vitals reviewed.  HENT:     Head: Normocephalic.  Cardiovascular:     Rate and Rhythm: Normal rate.     Pulses: Normal pulses.  Pulmonary:     Effort: Pulmonary effort is normal.  Skin:    General: Skin is warm and dry.  Neurological:     Mental Status: She is alert and oriented to person, place, and time.  Psychiatric:        Mood and Affect: Mood normal.        Behavior: Behavior normal.        Thought Content: Thought content normal.        Judgment: Judgment normal.     BP 121/70   Pulse 86   Ht 5\' 1"  (1.549 m)   Wt 101 lb (45.8 kg)   BMI 19.08 kg/m   Past Medical History:  Diagnosis Date  . ADD (attention deficit disorder)   . Anxiety   . Aortic aneurysm ([place])   . Atherosclerosis of abdominal aorta ([place])   . Bipolar disorder ([place])   . Bulging of lumbar intervertebral disc   . CAD (coronary artery disease)   . Cancer ([place])   .  COPD (chronic obstructive pulmonary disease) ([place])   . Depression   . Diverticulitis   . Dyspnea   . History of kidney stones   . Hyperlipidemia   . Hypertension   . Insomnia   . Lumbar spondylosis   . MI (myocardial infarction) ([place])    2007, 2019  . Migraine   . Osteoporosis   . PONV (postoperative nausea and vomiting)   . PVD (peripheral vascular disease) ([place])   . 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 SVT (supraventricular tachycardia) (HCC)     Social History   Socioeconomic History  . Marital status: Divorced    Spouse name: Not on file  . Number of children: Not on file  . Years of education: Not on file  . Highest education level: Not on file  Occupational History  . Occupation: disabled  Tobacco Use  . Smoking status: Former Smoker    Packs/day: 1.00    Years: 41.00    Pack years: 41.00    Types: Cigarettes    Quit date: 09/19/2018    Years since quitting: 2.3  .  Smokeless tobacco: Never Used  . Tobacco comment: uses nicotine gums  Substance and Sexual Activity  . Alcohol use: No  . Drug use: No  . Sexual activity: Not on file  Other Topics Concern  . Not on file  Social History Narrative  . Not on file   Social Determinants of Health   Financial Resource Strain: Not on file  Food Insecurity: Not on file  Transportation Needs: Not on file  Physical Activity: Not on file  Stress: Not on file  Social Connections: Not on file  Intimate Partner Violence: Not on file    Past Surgical History:  Procedure Laterality Date  . ABDOMINAL HYSTERECTOMY    . CAROTID ANGIOGRAPHY N/A 01/13/2018   Procedure: CAROTID ANGIOGRAPHY;  Surgeon: Algernon Huxley, MD;  Location: [facility];  Service: Cardiovascular;  Laterality: N/A;  . COLONOSCOPY    . CORONARY ANGIOPLASTY WITH STENT PLACEMENT    . ENDOVASCULAR REPAIR/STENT GRAFT Bilateral 12/14/2020   Procedure: ENDOVASCULAR REPAIR/STENT  GRAFT;  Surgeon: Algernon Huxley, MD;  Location: [place] [facility];  Service: Cardiovascular;  Laterality: Bilateral;  . iv INJECTIONS TO BACK    . LEFT HEART CATH Right 07/07/2018   Procedure: Left Heart Cath and Coronary Angiography;  Surgeon: Dionisio David, MD;  Location: [place];  Service: Cardiovascular;  Laterality: Right;    Family History  Problem Relation Age of Onset  . Breast cancer Mother 79  . Breast cancer Maternal Aunt        mat aunt and great aunt  . Depression Sister     Allergies  Allergen Reactions  . Effexor Xr [Venlafaxine Hcl]     suicidality  . Penicillins Swelling  . Amoxicillin-Pot Clavulanate   . Keflex [Cephalexin] Hives    CBC Latest Ref Rng & Units 12/31/2020 12/15/2020 12/12/2020  WBC 4.0 - 10.5 K/uL 10.4 16.5(H) 11.1(H)  Hemoglobin 12.0 - 15.0 g/dL 12.5 11.4(L) 14.6  Hematocrit 36.0 - 46.0 % 37.9 34.1(L) 43.2  Platelets 150 - 400 K/uL 363 214 298      CMP     Component Value Date/Time   NA 138 12/31/2020 1745   K 4.8 12/31/2020 1745   CL 103 12/31/2020 1745   CO2 25 12/31/2020 1745   GLUCOSE 111 (H) 12/31/2020 1745   BUN 20 12/31/2020 1745   CREATININE 0.74 12/31/2020 1745   CALCIUM 9.1 12/31/2020 1745   PROT 7.0 07/17/2020 0855   ALBUMIN 4.1 07/17/2020 0855   AST 22 07/17/2020 0855   ALT 16 07/17/2020 0855   ALKPHOS 107 07/17/2020 0855   BILITOT 1.0 07/17/2020 0855   GFRNONAA >60 12/31/2020 1745   GFRAA >60 07/17/2020 0855     No results found.     Assessment & Plan:   1. Abdominal aortic aneurysm (AAA) without rupture (HCC) Recommend: Patient is status post successful endovascular repair of the AAA.   No further intervention is required at this time.   No endoleak is detected and the aneurysm sac is stable.  The patient will continue antiplatelet therapy as prescribed as well as aggressive management of hyperlipidemia. Exercise is again strongly encouraged.   However, endografts require continued  surveillance with ultrasound or CT scan. This is mandatory to detect any changes that allow repressurization of the aneurysm sac.  The patient is informed that this would be asymptomatic.  The patient is reminded that lifelong routine surveillance is a necessity with an endograft. Patient will continue to follow-up at  6 month intervals with ultrasound of the aorta.  2. Benign essential hypertension Continue antihypertensive medications as already ordered, these medications have been reviewed and there are no changes at this time.   3. Pure hypercholesterolemia Continue statin as ordered and reviewed, no changes at this time    Current Outpatient Medications on File Prior to Visit  Medication Sig Dispense Refill  . aspirin EC 81 MG tablet Take 162 mg by mouth every evening.     Marland Kitchen atorvastatin (LIPITOR) 80 MG tablet Take 80 mg by mouth every evening.     . Cholecalciferol (VITAMIN D3) 1.25 MG (50000 UT) CAPS Take 1 tablet by mouth once a week.    . clopidogrel (PLAVIX) 75 MG tablet Take 1 tablet (75 mg total) by mouth daily. 90 tablet 3  . DULoxetine (CYMBALTA) 30 MG capsule TAKE 1 CAPSULE(30 MG) BY MOUTH DAILY (Patient taking differently: Take 30 mg by mouth daily.) 30 capsule 1  . ezetimibe (ZETIA) 10 MG tablet Take 10 mg by mouth daily.     . fluticasone (FLONASE) 50 MCG/ACT nasal spray Place 1 spray into both nostrils daily as needed for allergies.    . hydrOXYzine (VISTARIL) 25 MG capsule Take 1 capsule (25 mg total) by mouth as directed. Take 1 capsule daily as needed for anxiety and 2 capsules at bedtime as needed for sleep (Patient taking differently: Take 50 mg by mouth See admin instructions. Take 1 capsule daily as needed for anxiety and 2 capsules at bedtime) 90 capsule 1  . meclizine (ANTIVERT) 25 MG tablet Take 25 mg by mouth as needed for dizziness.    . metoprolol succinate (TOPROL-XL) 50 MG 24 hr tablet Take 50 mg by mouth daily.    . Multiple Vitamin (MULTIVITAMIN) tablet  Take 1 tablet by mouth daily. Centrum Silver for Women    . nitroGLYCERIN (NITROSTAT) 0.4 MG SL tablet Place 0.4 mg under the tongue every 5 (five) minutes x 3 doses as needed for chest pain.     . Probiotic Product (PROBIOTIC DAILY PO) Take 1 capsule by mouth daily.    . promethazine (PHENERGAN) 25 MG tablet Take 25 mg by mouth every 8 (eight) hours as needed for nausea or vomiting.    . traMADol HCl 100 MG TABS Take 1.5 tablets by mouth 2 (two) times daily as needed. (Patient taking differently: Take 1.5 tablets by mouth 2 (two) times daily as needed (pain).) 90 tablet 2   No current facility-administered medications on file prior to visit.    There are no Patient Instructions on file for this visit. No follow-ups on file.   Kris Hartmann, NP

## 2021-01-19 ENCOUNTER — Other Ambulatory Visit: Payer: Self-pay | Admitting: Ophthalmology

## 2021-01-19 DIAGNOSIS — H4912 Fourth [trochlear] nerve palsy, left eye: Secondary | ICD-10-CM

## 2021-01-19 DIAGNOSIS — I209 Angina pectoris, unspecified: Secondary | ICD-10-CM | POA: Diagnosis not present

## 2021-01-19 DIAGNOSIS — R0602 Shortness of breath: Secondary | ICD-10-CM | POA: Diagnosis not present

## 2021-01-19 DIAGNOSIS — I10 Essential (primary) hypertension: Secondary | ICD-10-CM | POA: Diagnosis not present

## 2021-01-19 DIAGNOSIS — I2510 Atherosclerotic heart disease of native coronary artery without angina pectoris: Secondary | ICD-10-CM | POA: Diagnosis not present

## 2021-01-19 DIAGNOSIS — I25810 Atherosclerosis of coronary artery bypass graft(s) without angina pectoris: Secondary | ICD-10-CM | POA: Diagnosis not present

## 2021-01-19 DIAGNOSIS — E782 Mixed hyperlipidemia: Secondary | ICD-10-CM | POA: Diagnosis not present

## 2021-01-23 ENCOUNTER — Telehealth (INDEPENDENT_AMBULATORY_CARE_PROVIDER_SITE_OTHER): Payer: Medicare HMO | Admitting: Psychiatry

## 2021-01-23 ENCOUNTER — Encounter: Payer: Self-pay | Admitting: Psychiatry

## 2021-01-23 ENCOUNTER — Other Ambulatory Visit: Payer: Self-pay

## 2021-01-23 DIAGNOSIS — G4701 Insomnia due to medical condition: Secondary | ICD-10-CM

## 2021-01-23 DIAGNOSIS — F3176 Bipolar disorder, in full remission, most recent episode depressed: Secondary | ICD-10-CM

## 2021-01-23 DIAGNOSIS — F411 Generalized anxiety disorder: Secondary | ICD-10-CM

## 2021-01-23 DIAGNOSIS — R69 Illness, unspecified: Secondary | ICD-10-CM | POA: Diagnosis not present

## 2021-01-23 MED ORDER — DULOXETINE HCL 30 MG PO CPEP: ORAL_CAPSULE | ORAL | 1 refills | Status: DC

## 2021-01-23 MED ORDER — TRAZODONE HCL 100 MG PO TABS: 100.0000 mg | ORAL_TABLET | Freq: Every day | ORAL | 1 refills | Status: DC

## 2021-01-23 NOTE — Progress Notes (Signed)
Virtual Visit via Video Note  I connected with Melissa Montes on 01/23/21 at  2:20 PM EST by a video enabled telemedicine application and verified that I am speaking with the correct person using two identifiers.  Location Provider Location : [facility] Patient Location : Home  Participants: Patient , Provider    I discussed the limitations of evaluation and management by telemedicine and the availability of in person appointments. The patient expressed understanding and agreed to proceed.   I discussed the assessment and treatment plan with the patient. The patient was provided an opportunity to ask questions and all were answered. The patient agreed with the plan and demonstrated an understanding of the instructions.   The patient was advised to call back or seek an in-person evaluation if the symptoms worsen or if the condition fails to improve as anticipated.   [place] MD OP Progress Note  01/23/2021 9:17 PM Melissa Montes  MRN:  [id number]  Chief Complaint:  Chief Complaint    Follow-up; Anxiety; Insomnia     HPI: Melissa Montes is a 65 year old Caucasian female on disability, divorced, lives in [place], has a history of bipolar disorder, GAD, abdominal aortic aneurysm, diverticulitis, chronic pain was evaluated by telemedicine today.  Patient today reports she is currently struggling with dizzy spells, double vision.  She has upcoming appointment with her providers for management.  She had a fall a few weeks ago and she was evaluated in the emergency department.  She reports she had imaging done of her head and that was normal.  She reports it is likely due to her double vision and they asked her to follow-up with her provider for the same.  She has to get special classes.  Patient reports she had trouble sleeping and has been back on the trazodone.  The trazodone started working again and she wants to stay on it.  She currently takes 100 mg.  She denies any significant  depression.  Denies any mood swings.  Patient does report she is anxious about her current health issues.  sHe however has been coping okay.  She has been using the hydroxyzine  rarely.  Patient is compliant on Cymbalta.  Denies side effects.  She denies any suicidality, homicidality or perceptual disturbances.    Visit Diagnosis:    ICD-10-CM   1. GAD (generalized anxiety disorder)  F41.1 DULoxetine (CYMBALTA) 30 MG capsule  2. Bipolar disorder, in full remission, most recent episode depressed ([place])  F31.76   3. Insomnia due to medical condition  G47.01 traZODone (DESYREL) 100 MG tablet   Pain    Past Psychiatric History: I have reviewed past psychiatric history from my progress note on 04/26/2020  Past Medical History:  Past Medical History:  Diagnosis Date  . ADD (attention deficit disorder)   . Anxiety   . Aortic aneurysm ([employer])   . Atherosclerosis of abdominal aorta ([place])   . Bipolar disorder ([place])   . Bulging of lumbar intervertebral disc   . CAD (coronary artery disease)   . Cancer ([place])   . COPD (chronic obstructive pulmonary disease) ([place])   . Depression   . Diverticulitis   . Dyspnea   . History of kidney stones   . Hyperlipidemia   . Hypertension   . Insomnia   . Lumbar spondylosis   . MI (myocardial infarction) ([place])    2007, 2019  . Migraine   . Osteoporosis   . PONV (postoperative nausea and vomiting)   . PVD (peripheral  vascular disease) ([place])   . 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 SVT (supraventricular tachycardia) (HCC)     Past Surgical History:  Procedure Laterality Date  . ABDOMINAL HYSTERECTOMY    . CAROTID ANGIOGRAPHY N/A 01/13/2018   Procedure: CAROTID ANGIOGRAPHY;  Surgeon: Algernon Huxley, MD;  Location: [facility];  Service: Cardiovascular;  Laterality: N/A;  . COLONOSCOPY    . CORONARY ANGIOPLASTY WITH STENT PLACEMENT    . ENDOVASCULAR REPAIR/STENT GRAFT  Bilateral 12/14/2020   Procedure: ENDOVASCULAR REPAIR/STENT GRAFT;  Surgeon: Algernon Huxley, MD;  Location: [facility];  Service: Cardiovascular;  Laterality: Bilateral;  . iv INJECTIONS TO BACK    . LEFT HEART CATH Right 07/07/2018   Procedure: Left Heart Cath and Coronary Angiography;  Surgeon: Dionisio David, MD;  Location: [facility];  Service: Cardiovascular;  Laterality: Right;    Family Psychiatric History: Reviewed family psychiatric history from my progress note from 04/26/2020  Family History:  Family History  Problem Relation Age of Onset  . Breast cancer Mother 17  . Breast cancer Maternal Aunt        mat aunt and great aunt  . Depression Sister     Social History: Reviewed social history from my progress note on 04/21/2020 Social History   Socioeconomic History  . Marital status: Divorced    Spouse name: Not on file  . Number of children: Not on file  . Years of education: Not on file  . Highest education level: Not on file  Occupational History  . Occupation: disabled  Tobacco Use  . Smoking status: Former Smoker    Packs/day: 1.00    Years: 41.00    Pack years: 41.00    Types: Cigarettes    Quit date: 09/19/2018    Years since quitting: 2.3  . Smokeless tobacco: Never Used  . Tobacco comment: uses nicotine gums  Substance and Sexual Activity  . Alcohol use: No  . Drug use: No  . Sexual activity: Not on file  Other Topics Concern  . Not on file  Social History Narrative  . Not on file   Social Determinants of Health   Financial Resource Strain: Not on file  Food Insecurity: Not on file  Transportation Needs: Not on file  Physical Activity: Not on file  Stress: Not on file  Social Connections: Not on file    Allergies:  Allergies  Allergen Reactions  . Effexor Xr [Venlafaxine Hcl]     suicidality  . Penicillins Swelling  . Amoxicillin-Pot Clavulanate   . Keflex [Cephalexin] Hives    Metabolic Disorder Labs: No results found  for: HGBA1C, MPG No results found for: PROLACTIN No results found for: CHOL, TRIG, HDL, CHOLHDL, VLDL, LDLCALC No results found for: TSH  Therapeutic Level Labs: No results found for: LITHIUM No results found for: VALPROATE No components found for:  CBMZ  Current Medications: Current Outpatient Medications  Medication Sig Dispense Refill  . traZODone (DESYREL) 100 MG tablet Take 1 tablet (100 mg total) by mouth at bedtime. 30 tablet 1  . aspirin EC 81 MG tablet Take 162 mg by mouth every evening.     Marland Kitchen atorvastatin (LIPITOR) 80 MG tablet Take 80 mg by mouth every evening.     . Cholecalciferol (VITAMIN D3) 1.25 MG (50000 UT) CAPS Take 1 tablet by mouth once a week.    . clopidogrel (PLAVIX) 75  MG tablet Take 1 tablet (75 mg total) by mouth daily. 90 tablet 3  . DULoxetine (CYMBALTA) 30 MG capsule TAKE 1 CAPSULE(30 MG) BY MOUTH DAILY 30 capsule 1  . ezetimibe (ZETIA) 10 MG tablet Take 10 mg by mouth daily.     . fluticasone (FLONASE) 50 MCG/ACT nasal spray Place 1 spray into both nostrils daily as needed for allergies.    . hydrOXYzine (VISTARIL) 25 MG capsule Take 1 capsule (25 mg total) by mouth as directed. Take 1 capsule daily as needed for anxiety and 2 capsules at bedtime as needed for sleep (Patient taking differently: Take 50 mg by mouth See admin instructions. Take 1 capsule daily as needed for anxiety and 2 capsules at bedtime) 90 capsule 1  . meclizine (ANTIVERT) 25 MG tablet Take 25 mg by mouth as needed for dizziness.    . metoprolol succinate (TOPROL-XL) 50 MG 24 hr tablet Take 50 mg by mouth daily.    . Multiple Vitamin (MULTIVITAMIN) tablet Take 1 tablet by mouth daily. Centrum Silver for Women    . nitroGLYCERIN (NITROSTAT) 0.4 MG SL tablet Place 0.4 mg under the tongue every 5 (five) minutes x 3 doses as needed for chest pain.     . Probiotic Product (PROBIOTIC DAILY PO) Take 1 capsule by mouth daily.    . promethazine (PHENERGAN) 25 MG tablet Take 25 mg by mouth every 8  (eight) hours as needed for nausea or vomiting.    . traMADol HCl 100 MG TABS Take 1.5 tablets by mouth 2 (two) times daily as needed. (Patient taking differently: Take 1.5 tablets by mouth 2 (two) times daily as needed (pain).) 90 tablet 2   No current facility-administered medications for this visit.     Musculoskeletal: Strength & Muscle Tone: UTA Gait & Station: UTA Patient leans: N/A  Psychiatric Specialty Exam: Review of Systems  Neurological: Positive for dizziness.       Double vision - chronic  Psychiatric/Behavioral: The patient is nervous/anxious.     There were no vitals taken for this visit.There is no height or weight on file to calculate BMI.  General Appearance: Casual  Eye Contact:  Fair  Speech:  Clear and Coherent  Volume:  Normal  Mood:  Anxious  Affect:  Appropriate  Thought Process:  Goal Directed and Descriptions of Associations: Intact  Orientation:  Full (Time, Place, and Person)  Thought Content: Logical   Suicidal Thoughts:  No  Homicidal Thoughts:  No  Memory:  Immediate;   Fair Recent;   Fair Remote;   Fair  Judgement:  Fair  Insight:  Fair  Psychomotor Activity:  Normal  Concentration:  Concentration: Fair and Attention Span: Fair  Recall:  [employer] of Knowledge: Fair  Language: Fair  Akathisia:  No  Handed:  Right  AIMS (if indicated): UTA  Assets:  Communication Skills Desire for Improvement Housing Social Support  ADL's:  Intact  Cognition: WNL  Sleep:  Fair   Screenings: PHQ2-9   Flowsheet Row Video Visit from 01/23/2021 in [facility] Office Visit from 06/28/2020 in [facility] Procedure visit from 04/11/2020 in [facility] Procedure visit from 03/14/2020 in [facility] from [facility]/20/2018 in [facility]  PHQ-2 Total Score 1 0 0 0 4  PHQ-9  Total Score - - - - 13    Flowsheet Row Video Visit from 01/23/2021  in [facility] ED from 12/31/2020 in [facility] Admission (Discharged) from 12/14/2020 in [facility] No Risk No Risk No Risk       Assessment and Plan: Presleigh Feldstein is a 65 year old Caucasian female, disabled, divorced, lives in [place], has a history of bipolar disorder, GAD, aortic aneurysm status post recent repair, diverticulitis, MI status post stent placement, chronic pain was evaluated by telemedicine today.  Patient with anxiety symptoms, however is coping okay.  Continue plan as noted below.  Plan GAD-stable Cymbalta 30 mg p.o. daily Hydroxyzine 25 mg p.o. daily as needed for severe anxiety and sleep.  Bipolar disorder in remission Monitor closely  Insomnia-improving Restart trazodone 100 mg p.o. nightly as needed.  Patient already started taking trazodone from a previous prescription that she had. Hydroxyzine 25 mg p.o. nightly as needed  I have reviewed medical records dated 12/31/2020-emergency department-Dr.Funke -CT head-12/31/2020-no acute pathology.  CT cervical spine without contrast-no acute pathology like fractures or subluxation.  Focal moderate disc space height loss and osteophytes of C6-C7.'  Follow-up in clinic in 2 months in person.  This note was generated in part or whole with voice recognition software. Voice recognition is usually quite accurate but there are transcription errors that can and very often do occur. I apologize for any typographical errors that were not detected and corrected.        Ursula Alert, MD 01/23/2021, 9:17 PM

## 2021-01-24 DIAGNOSIS — H903 Sensorineural hearing loss, bilateral: Secondary | ICD-10-CM | POA: Diagnosis not present

## 2021-01-24 DIAGNOSIS — R42 Dizziness and giddiness: Secondary | ICD-10-CM | POA: Diagnosis not present

## 2021-02-02 DIAGNOSIS — I209 Angina pectoris, unspecified: Secondary | ICD-10-CM | POA: Diagnosis not present

## 2021-02-03 ENCOUNTER — Ambulatory Visit
Admission: RE | Admit: 2021-02-03 | Discharge: 2021-02-03 | DRG: 123 | Disposition: A | Discharge status: Home / Self Care | Payer: Medicare HMO | Source: Ambulatory Visit | Attending: Ophthalmology | Admitting: Ophthalmology

## 2021-02-03 ENCOUNTER — Other Ambulatory Visit: Payer: Self-pay

## 2021-02-03 DIAGNOSIS — R42 Dizziness and giddiness: Secondary | ICD-10-CM | POA: Diagnosis not present

## 2021-02-03 DIAGNOSIS — H4912 Fourth [trochlear] nerve palsy, left eye: Secondary | ICD-10-CM | POA: Diagnosis not present

## 2021-02-03 DIAGNOSIS — H532 Diplopia: Secondary | ICD-10-CM | POA: Diagnosis not present

## 2021-02-03 DIAGNOSIS — J323 Chronic sphenoidal sinusitis: Secondary | ICD-10-CM | POA: Diagnosis not present

## 2021-02-03 DIAGNOSIS — J320 Chronic maxillary sinusitis: Secondary | ICD-10-CM | POA: Diagnosis not present

## 2021-02-03 MED ORDER — GADOBUTROL 1 MMOL/ML IV SOLN
4.0000 mL | Freq: Once | INTRAVENOUS | Status: AC | PRN
  Administered 2021-02-03: 4 mL via INTRAVENOUS

## 2021-02-07 ENCOUNTER — Encounter: Payer: Self-pay | Admitting: Student in an Organized Health Care Education/Training Program

## 2021-02-07 ENCOUNTER — Ambulatory Visit
Payer: Medicare HMO | Attending: Student in an Organized Health Care Education/Training Program | Admitting: Student in an Organized Health Care Education/Training Program

## 2021-02-07 ENCOUNTER — Other Ambulatory Visit: Payer: Self-pay

## 2021-02-07 VITALS — BP 128/83 | HR 76 | Temp 97.4°F | Resp 18 | Ht 61.0 in | Wt 102.0 lb

## 2021-02-07 DIAGNOSIS — M25551 Pain in right hip: Secondary | ICD-10-CM | POA: Diagnosis not present

## 2021-02-07 DIAGNOSIS — M12811 Other specific arthropathies, not elsewhere classified, right shoulder: Secondary | ICD-10-CM

## 2021-02-07 DIAGNOSIS — G894 Chronic pain syndrome: Secondary | ICD-10-CM

## 2021-02-07 DIAGNOSIS — G8929 Other chronic pain: Secondary | ICD-10-CM

## 2021-02-07 DIAGNOSIS — M25552 Pain in left hip: Secondary | ICD-10-CM | POA: Insufficient documentation

## 2021-02-07 DIAGNOSIS — M75101 Unspecified rotator cuff tear or rupture of right shoulder, not specified as traumatic: Secondary | ICD-10-CM | POA: Insufficient documentation

## 2021-02-07 DIAGNOSIS — M47816 Spondylosis without myelopathy or radiculopathy, lumbar region: Secondary | ICD-10-CM

## 2021-02-07 DIAGNOSIS — M160 Bilateral primary osteoarthritis of hip: Secondary | ICD-10-CM | POA: Diagnosis not present

## 2021-02-07 DIAGNOSIS — M25512 Pain in left shoulder: Secondary | ICD-10-CM | POA: Diagnosis not present

## 2021-02-07 DIAGNOSIS — M25511 Pain in right shoulder: Secondary | ICD-10-CM | POA: Diagnosis not present

## 2021-02-07 MED ORDER — TRAMADOL HCL 100 MG PO TABS: 1.5000 | ORAL_TABLET | Freq: Two times a day (BID) | ORAL | 2 refills | Status: DC | PRN

## 2021-02-07 NOTE — Progress Notes (Signed)
Nursing Pain Medication Assessment:  Safety precautions to be maintained throughout the outpatient stay will include: orient to surroundings, keep bed in low position, maintain call bell within reach at all times, provide assistance with transfer out of bed and ambulation.  Medication Inspection Compliance: Pill count conducted under aseptic conditions, in front of the patient. Neither the pills nor the bottle was removed from the patient's sight at any time. Once count was completed pills were immediately returned to the patient in their original bottle.  Medication: Tramadol (Ultram) Pill/Patch Count: 85 of 90 pills remain Pill/Patch Appearance: Markings consistent with prescribed medication Bottle Appearance: Standard pharmacy container. Clearly labeled. Filled Date: 2 / 1 / 2022 Last Medication intake:  Today

## 2021-02-07 NOTE — Progress Notes (Signed)
PROVIDER NOTE: Information contained herein reflects review and annotations entered in association with encounter. Interpretation of such information and data should be left to medically-trained personnel. Information provided to patient can be located elsewhere in the medical record under "Patient Instructions". Document created using STT-dictation technology, any transcriptional errors that may result from process are unintentional.    Patient: Melissa Montes  Service Category: E/M  Provider: Gillis Santa, MD  DOB: [birth year]  DOS: 02/07/2021  Specialty: Interventional Pain Management  MRN: [id number]  Setting: Ambulatory outpatient  PCP: Perrin Maltese, MD  Type: Established Patient    Referring Provider: Perrin Maltese, MD  Location: Office  Delivery: Face-to-face     HPI  Melissa Montes, a 65 y.o. year old female, is here today because of her Chronic pain syndrome [G89.4]. Ms. Lang's primary complain today is Back Pain (low) Last encounter: My last encounter with her was on 08/29/2020. Pertinent problems: Ms. Wesche has S/P coronary artery stent placement; Peripheral vascular disease ([place]); GAD (generalized anxiety disorder); S/P coronary artery bypass graft x 2; Osteoporosis, post-menopausal; Chronic pain syndrome; Lumbar facet arthropathy; Bilateral hip pain; Primary osteoarthritis of both hips; Chronic left shoulder pain; Right rotator cuff tear arthropathy; Left rotator cuff tear arthropathy; and Lumbar degenerative disc disease on their pertinent problem list. Pain Assessment: Severity of Chronic pain is reported as a 5 /10. Location: Back Lower/denies. Onset: More than a month ago. Quality: Aching. Timing: Constant. Modifying factor(s): medications. Vitals:  height is '5\' 1"'  (1.549 m) and weight is 102 lb (46.3 kg). Her temperature is 97.4 F (36.3 C) (abnormal). Her blood pressure is 128/83 and her pulse is 76. Her respiration is 18 and oxygen saturation is 99%.   Reason  for encounter: medication management.    Patient follows up today for medication management.  Since her last visit with me, she had surgery for her abdominal aortic aneurysm and had a stent graft placed on 12/11/2020.  She also had an MRI done of her sacrum which shows mild SI joint arthropathy.  She is on Plavix long-term.  Would like to avoid any neuraxial procedures that would necessitate her being off of Plavix for 7 days.  Focus will be medication management going forward.  I will refill her tramadol as below.  No change in dose.  We will also repeat her urine toxicology screen for medication compliance and monitoring.  Pharmacotherapy Assessment   Analgesic: Tramadol 100 mg TID PRN #90/month MME 30   Monitoring: [place] PMP: PDMP reviewed during this encounter.       Pharmacotherapy: No side-effects or adverse reactions reported. Compliance: No problems identified. Effectiveness: Clinically acceptable.  Dewayne Shorter, RN  02/07/2021  1:31 PM  Signed Nursing Pain Medication Assessment:  Safety precautions to be maintained throughout the outpatient stay will include: orient to surroundings, keep bed in low position, maintain call bell within reach at all times, provide assistance with transfer out of bed and ambulation.  Medication Inspection Compliance: Pill count conducted under aseptic conditions, in front of the patient. Neither the pills nor the bottle was removed from the patient's sight at any time. Once count was completed pills were immediately returned to the patient in their original bottle.  Medication: Tramadol (Ultram) Pill/Patch Count: 85 of 90 pills remain Pill/Patch Appearance: Markings consistent with prescribed medication Bottle Appearance: Standard pharmacy container. Clearly labeled. Filled Date: 2 / 1 / 2022 Last Medication intake:  Today    UDS: No results found for: SUMMARY  ROS  Constitutional: Denies any fever or chills Gastrointestinal: No reported hemesis,  hematochezia, vomiting, or acute GI distress Musculoskeletal: Low back, bilateral SI joint pain Neurological: No reported episodes of acute onset apraxia, aphasia, dysarthria, agnosia, amnesia, paralysis, loss of coordination, or loss of consciousness  Medication Review  DULoxetine, Probiotic Product, Vitamin D3, aspirin EC, atorvastatin, clopidogrel, ezetimibe, fluticasone, hydrOXYzine, meclizine, metoprolol succinate, multivitamin, nitroGLYCERIN, promethazine, traMADol HCl, and traZODone  History Review  Allergy: Ms. Start is allergic to effexor xr [venlafaxine hcl], penicillins, amoxicillin-pot clavulanate, and keflex [cephalexin]. Drug: Ms. Cottingham  reports no history of drug use. Alcohol:  reports no history of alcohol use. Tobacco:  reports that she quit smoking about 2 years ago. Her smoking use included cigarettes. She has a 41.00 pack-year smoking history. She has never used smokeless tobacco. Social: Melissa Montes  reports that she quit smoking about 2 years ago. Her smoking use included cigarettes. She has a 41.00 pack-year smoking history. She has never used smokeless tobacco. She reports that she does not drink alcohol and does not use drugs. Medical:  has a past medical history of ADD (attention deficit disorder), Anxiety, Aortic aneurysm ([place]), Atherosclerosis of abdominal aorta ([place]), Bipolar disorder ([place]), Bulging of lumbar intervertebral disc, CAD (coronary artery disease), Cancer ([place]), COPD (chronic obstructive pulmonary disease) ([place]), Depression, Diverticulitis, Dyspnea, History of kidney stones, Hyperlipidemia, Hypertension, Insomnia, Lumbar spondylosis, MI (myocardial infarction) ([place]), Migraine, Osteoporosis, PONV (postoperative nausea and vomiting), PVD (peripheral vascular disease) ([place]), 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 Surgical: Melissa Montes  has a past  surgical history that includes Coronary angioplasty with stent; Abdominal hysterectomy; CAROTID ANGIOGRAPHY (N/A, 01/13/2018); Left Heart Cath (Right, 07/07/2018); Colonoscopy; iv INJECTIONS TO BACK; and ENDOVASCULAR REPAIR/STENT GRAFT (Bilateral, 12/14/2020). Family: family history includes Breast cancer in her maternal aunt; Breast cancer (age of onset: 7) in her mother; Depression in her sister.  Laboratory Chemistry Profile   Renal Lab Results  Component Value Date   BUN 20 12/31/2020   CREATININE 0.74 12/31/2020   GFRAA >60 07/17/2020   GFRNONAA >60 12/31/2020     Hepatic Lab Results  Component Value Date   AST 22 07/17/2020   ALT 16 07/17/2020   ALBUMIN 4.1 07/17/2020   ALKPHOS 107 07/17/2020     Electrolytes Lab Results  Component Value Date   NA 138 12/31/2020   K 4.8 12/31/2020   CL 103 12/31/2020   CALCIUM 9.1 12/31/2020     Bone No results found for: VD25OH, VD125OH2TOT, NL8921JH4, RD4081KG8, 25OHVITD1, 25OHVITD2, 25OHVITD3, TESTOFREE, TESTOSTERONE   Inflammation (CRP: Acute Phase) (ESR: Chronic Phase) No results found for: CRP, ESRSEDRATE, LATICACIDVEN     Note: Above Lab results reviewed.  Recent Imaging Review  MR BRAIN/IAC W WO CONTRAST CLINICAL DATA:  Double vision over the last 8 months. Left fourth nerve palsy. Dizziness with falling.  EXAM: MRI HEAD WITHOUT AND WITH CONTRAST  TECHNIQUE: Multiplanar, multiecho pulse sequences of the brain and surrounding structures were obtained without and with intravenous contrast.  CONTRAST:  29m GADAVIST GADOBUTROL 1 MMOL/ML IV SOLN  COMPARISON:  Head CT 12/31/2020  FINDINGS: Brain: Diffusion imaging does not show any acute or subacute infarction or other cause of restricted diffusion. The brainstem and cerebellum appear normal. Perimesencephalic cistern regions appear normal. No abnormality seen of the skull base or cavernous sinus regions.  Cerebral hemispheres are normal except for a 1 cm region of  T2 and FLAIR signal within the  deep white matter just above the mid body of the right lateral ventricle. This is nonspecific and at this age would most commonly represent an old white matter infarction. Demyelination is not excluded but there are no second lesions or characteristic pattern. No evidence of mass, hemorrhage, hydrocephalus or extra-axial collection.  Detailed images of the fourth nerve course do not show any abnormal finding. Elsewhere, no abnormal enhancement of the brain or leptomeninges.  Vascular: Major vessels at the base of the brain show flow. The examination was not done as an MR angiogram, but there is no finding to suggest aneurysm.  Skull and upper cervical spine: Normal  Sinuses/Orbits: Small amount of fluid in the left division of the sphenoid sinus. Mild mucosal thickening at floor of the left maxillary sinus. Orbits negative.  Other: None  IMPRESSION: 1. No abnormality seen to explain the clinical presentation. No cause of left cranial nerve IV abnormality seen. 2. 1 cm region of T2 and FLAIR signal within the deep white matter just above the mid body of the right lateral ventricle. This is nonspecific and could represent an old white matter infarction. Demyelination is not excluded but there are no second lesions or any characteristic pattern.  Electronically Signed   By: Nelson Chimes M.D.   On: 02/03/2021 16:11 Note: Reviewed        Physical Exam  General appearance: Well nourished, well developed, and well hydrated. In no apparent acute distress Mental status: Alert, oriented x 3 (person, place, & time)       Respiratory: No evidence of acute respiratory distress Eyes: PERLA Vitals: BP 128/83   Pulse 76   Temp (!) 97.4 F (36.3 C)   Resp 18   Ht '5\' 1"'  (1.549 m)   Wt 102 lb (46.3 kg)   SpO2 99%   BMI 19.27 kg/m  BMI: Estimated body mass index is 19.27 kg/m as calculated from the following:   Height as of this encounter: '5\' 1"'   (1.549 m).   Weight as of this encounter: 102 lb (46.3 kg). Ideal: Ideal body weight: 47.8 kg (105 lb 6.1 oz)  Bilateral SI joint pain, positive Faber's. Low back pain, worse with lumbar extension. 5 out of 5 strength bilateral lower extremity: Plantar flexion, dorsiflexion, knee flexion, knee extension.  Assessment   Status Diagnosis  Controlled Controlled Controlled 1. Chronic pain syndrome   2. Lumbar facet arthropathy   3. Bilateral hip pain   4. Primary osteoarthritis of both hips   5. Right rotator cuff tear arthropathy   6. Chronic left shoulder pain   7. Chronic right shoulder pain       Plan of Care   Ms. Ayako Tapanes Agostini has a current medication list which includes the following long-term medication(s): atorvastatin, duloxetine, ezetimibe, fluticasone, metoprolol succinate, nitroglycerin, promethazine, trazodone, and [START ON 02/26/2021] tramadol hcl.  Pharmacotherapy (Medications Ordered): Meds ordered this encounter  Medications  . traMADol HCl 100 MG TABS    Sig: Take 1.5 tablets by mouth 2 (two) times daily as needed.    Dispense:  90 tablet    Refill:  2    For chronic pain syndrome   Orders:  Orders Placed This Encounter  Procedures  . ToxASSURE Select 13 (MW), Urine    Volume: 30 ml(s). Minimum 3 ml of urine is needed. Document temperature of fresh sample. Indications: Long term (current) use of opiate analgesic (Q30.092)    Order Specific Question:   Release to patient    Answer:  Immediate   Follow-up plan:   Return in about 3 months (around 05/10/2021) for Medication Management, in person.     Status post bilateral L3, L4, L5 facet medial branch nerve block #2 3/15/202, 90% pain relief for 10-12 hrs, proceed with RFA.  Left L3, L4, L5 RFA 03/14/2020; Right L3,4,5 RFA 04/11/20.  Repeat as needed, consider sprint peripheral nerve stimulation of lumbar medial branch         Recent Visits No visits were found meeting these conditions. Showing recent  visits within past 90 days and meeting all other requirements Today's Visits Date Type Provider Dept  02/07/21 Office Visit Gillis Santa, MD [facility]  Showing today's visits and meeting all other requirements Future Appointments No visits were found meeting these conditions. Showing future appointments within next 90 days and meeting all other requirements  I discussed the assessment and treatment plan with the patient. The patient was provided an opportunity to ask questions and all were answered. The patient agreed with the plan and demonstrated an understanding of the instructions.  Patient advised to call back or seek an in-person evaluation if the symptoms or condition worsens.  Duration of encounter: 30 minutes.  Note by: Gillis Santa, MD Date: 02/07/2021; Time: 2:07 PM

## 2021-02-08 DIAGNOSIS — R42 Dizziness and giddiness: Secondary | ICD-10-CM | POA: Diagnosis not present

## 2021-02-10 DIAGNOSIS — Z0100 Encounter for examination of eyes and vision without abnormal findings: Secondary | ICD-10-CM | POA: Diagnosis not present

## 2021-02-10 DIAGNOSIS — H4912 Fourth [trochlear] nerve palsy, left eye: Secondary | ICD-10-CM | POA: Diagnosis not present

## 2021-02-13 DIAGNOSIS — I209 Angina pectoris, unspecified: Secondary | ICD-10-CM | POA: Diagnosis not present

## 2021-02-14 ENCOUNTER — Encounter: Payer: Self-pay | Admitting: Student in an Organized Health Care Education/Training Program

## 2021-02-14 LAB — TOXASSURE SELECT 13 (MW), URINE

## 2021-02-16 DIAGNOSIS — I10 Essential (primary) hypertension: Secondary | ICD-10-CM | POA: Diagnosis not present

## 2021-02-16 DIAGNOSIS — R0602 Shortness of breath: Secondary | ICD-10-CM | POA: Diagnosis not present

## 2021-02-16 DIAGNOSIS — I2510 Atherosclerotic heart disease of native coronary artery without angina pectoris: Secondary | ICD-10-CM | POA: Diagnosis not present

## 2021-02-16 DIAGNOSIS — I25810 Atherosclerosis of coronary artery bypass graft(s) without angina pectoris: Secondary | ICD-10-CM | POA: Diagnosis not present

## 2021-03-08 DIAGNOSIS — R42 Dizziness and giddiness: Secondary | ICD-10-CM | POA: Diagnosis not present

## 2021-03-19 ENCOUNTER — Other Ambulatory Visit: Payer: Self-pay | Admitting: Psychiatry

## 2021-03-19 DIAGNOSIS — F411 Generalized anxiety disorder: Secondary | ICD-10-CM

## 2021-03-20 ENCOUNTER — Ambulatory Visit: Payer: Medicare HMO

## 2021-03-23 ENCOUNTER — Ambulatory Visit: Payer: Medicare HMO

## 2021-03-28 ENCOUNTER — Ambulatory Visit: Payer: No Typology Code available for payment source | Admitting: Psychiatry

## 2021-03-28 ENCOUNTER — Telehealth: Payer: Self-pay

## 2021-03-28 NOTE — Telephone Encounter (Signed)
Medication management and appointment - Telephone call with pt after she left a message she has potentially been exposed to COVID-19 to arrange for her appt today to be cancelled and rescheduled.  Informed admin assistant will call back today to schedule.  Patient stated she is not out of medication yet and should be okay for a few weeks if admin staff could call back and reschedule for the coming week.  Agreed to send notice to Dr. Shea Evans and administrative staff.  Patient wanted to make sure she will not be charged for late cancellation as she does have some symptoms and is taking a COVID-19 test today too.

## 2021-03-31 DIAGNOSIS — U071 COVID-19: Secondary | ICD-10-CM | POA: Diagnosis not present

## 2021-03-31 DIAGNOSIS — Z20822 Contact with and (suspected) exposure to covid-19: Secondary | ICD-10-CM | POA: Diagnosis not present

## 2021-04-05 ENCOUNTER — Telehealth: Payer: Self-pay

## 2021-04-05 NOTE — Telephone Encounter (Signed)
Left message for patient to notify them that it is time to schedule annual low dose lung cancer screening CT scan. Instructed patient to call back ([phone]) to verify information and schedule.  

## 2021-04-18 ENCOUNTER — Telehealth: Payer: Self-pay | Admitting: Student in an Organized Health Care Education/Training Program

## 2021-04-18 NOTE — Telephone Encounter (Signed)
Patient script expired before she could get her refill. Needs MM appointment before 06/21 scheduled appt. Will FU with Dr. Holley Raring on Monday.

## 2021-04-26 ENCOUNTER — Other Ambulatory Visit: Payer: Self-pay

## 2021-04-26 ENCOUNTER — Encounter: Payer: Self-pay | Admitting: Psychiatry

## 2021-04-26 ENCOUNTER — Telehealth (INDEPENDENT_AMBULATORY_CARE_PROVIDER_SITE_OTHER): Payer: Medicare HMO | Admitting: Psychiatry

## 2021-04-26 DIAGNOSIS — R69 Illness, unspecified: Secondary | ICD-10-CM | POA: Diagnosis not present

## 2021-04-26 DIAGNOSIS — G4701 Insomnia due to medical condition: Secondary | ICD-10-CM | POA: Diagnosis not present

## 2021-04-26 DIAGNOSIS — F411 Generalized anxiety disorder: Secondary | ICD-10-CM

## 2021-04-26 DIAGNOSIS — F3176 Bipolar disorder, in full remission, most recent episode depressed: Secondary | ICD-10-CM | POA: Diagnosis not present

## 2021-04-26 MED ORDER — TRAZODONE HCL 100 MG PO TABS: 100.0000 mg | ORAL_TABLET | Freq: Every day | ORAL | 1 refills | Status: DC

## 2021-04-26 MED ORDER — DULOXETINE HCL 30 MG PO CPEP: 30.0000 mg | ORAL_CAPSULE | Freq: Every day | ORAL | 1 refills | Status: DC

## 2021-04-26 MED ORDER — HYDROXYZINE PAMOATE 25 MG PO CAPS: 25.0000 mg | ORAL_CAPSULE | ORAL | 1 refills | Status: DC

## 2021-04-26 NOTE — Progress Notes (Signed)
Virtual Visit via Video Note  I connected with Melissa Montes on 04/26/21 at  2:00 PM EDT by a video enabled telemedicine application and verified that I am speaking with the correct person using two identifiers.  Location Provider Location : Office Patient Location : Home  Participants: Patient , Provider    I discussed the limitations of evaluation and management by telemedicine and the availability of in person appointments. The patient expressed understanding and agreed to proceed.   I discussed the assessment and treatment plan with the patient. The patient was provided an opportunity to ask questions and all were answered. The patient agreed with the plan and demonstrated an understanding of the instructions.   The patient was advised to call back or seek an in-person evaluation if the symptoms worsen or if the condition fails to improve as anticipated.  [place] MD OP Progress Note  04/26/2021 2:26 PM Melissa Montes  MRN:  [id number]  Chief Complaint:  Chief Complaint    Follow-up; Depression; Anxiety     HPI: Melissa Montes is a 65 year old Caucasian female on disability, divorced, lives in [place], has a history of bipolar disorder, GAD, abdominal aortic aneurysm, diverticulitis, chronic pain was evaluated by telemedicine today.  Patient today reports she recovered well from her COVID-19 infection.  She reports she does have some fatigue however she already had it even prior to the COVID-19 infection and does not really think it is significant at this time.  She reports overall her mood symptoms are good.  Denies any significant anxiety or sadness.  She reports sleep is good on the trazodone.  She continues to be in pain, low back pain and is currently under the care of pain management.  She takes tramadol which helps.  Patient denies any suicidality, homicidality or perceptual disturbances.  Patient denies any other concerns today.  Visit Diagnosis:     ICD-10-CM   1. GAD (generalized anxiety disorder)  F41.1 DULoxetine (CYMBALTA) 30 MG capsule    hydrOXYzine (VISTARIL) 25 MG capsule  2. Bipolar disorder, in full remission, most recent episode depressed ([place])  F31.76   3. Insomnia due to medical condition  G47.01 traZODone (DESYREL) 100 MG tablet   Pain    Past Psychiatric History: I have reviewed past psychiatric history from progress note on 04/26/2020  Past Medical History:  Past Medical History:  Diagnosis Date  . ADD (attention deficit disorder)   . Anxiety   . Aortic aneurysm ([place])   . Atherosclerosis of abdominal aorta ([place])   . Bipolar disorder ([place])   . Bulging of lumbar intervertebral disc   . CAD (coronary artery disease)   . Cancer ([place])   . COPD (chronic obstructive pulmonary disease) ([place])   . Depression   . Diverticulitis   . Dyspnea   . History of kidney stones   . Hyperlipidemia   . Hypertension   . Insomnia   . Lumbar spondylosis   . MI (myocardial infarction) ([place])    2007, 2019  . Migraine   . Osteoporosis   . PONV (postoperative nausea and vomiting)   . PVD (peripheral vascular disease) ([place])   . 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 SVT (supraventricular tachycardia) ([facility])     Past Surgical History:  Procedure Laterality Date  . ABDOMINAL HYSTERECTOMY    . CAROTID ANGIOGRAPHY N/A 01/13/2018   Procedure: CAROTID ANGIOGRAPHY;  Surgeon: Algernon Huxley, MD;  Location: [facility];  Service: Cardiovascular;  Laterality: N/A;  . COLONOSCOPY    . CORONARY ANGIOPLASTY WITH STENT PLACEMENT    . ENDOVASCULAR REPAIR/STENT GRAFT Bilateral 12/14/2020   Procedure: ENDOVASCULAR REPAIR/STENT GRAFT;  Surgeon: Algernon Huxley, MD;  Location: [facility];  Service: Cardiovascular;  Laterality: Bilateral;  . iv INJECTIONS TO BACK    . LEFT HEART CATH Right 07/07/2018   Procedure: Left Heart Cath and Coronary Angiography;  Surgeon: Dionisio David, MD;  Location: [facility];  Service: Cardiovascular;  Laterality: Right;    Family Psychiatric History: Reviewed family psychiatric history from progress note on 04/26/2020  Family History:  Family History  Problem Relation Age of Onset  . Breast cancer Mother 57  . Breast cancer Maternal Aunt        mat aunt and great aunt  . Depression Sister     Social History: Reviewed social history from progress note on 04/26/2020 Social History   Socioeconomic History  . Marital status: Divorced    Spouse name: Not on file  . Number of children: Not on file  . Years of education: Not on file  . Highest education level: Not on file  Occupational History  . Occupation: disabled  Tobacco Use  . Smoking status: Former Smoker    Packs/day: 1.00    Years: 41.00    Pack years: 41.00    Types: Cigarettes    Quit date: 09/19/2018    Years since quitting: 2.6  . Smokeless tobacco: Never Used  . Tobacco comment: uses nicotine gums  Substance and Sexual Activity  . Alcohol use: No  . Drug use: No  . Sexual activity: Not on file  Other Topics Concern  . Not on file  Social History Narrative  . Not on file   Social Determinants of Health   Financial Resource Strain: Not on file  Food Insecurity: Not on file  Transportation Needs: Not on file  Physical Activity: Not on file  Stress: Not on file  Social Connections: Not on file    Allergies:  Allergies  Allergen Reactions  . Effexor Xr [Venlafaxine Hcl]     suicidality  . Penicillins Swelling  . Amoxicillin-Pot Clavulanate   . Keflex [Cephalexin] Hives    Metabolic Disorder Labs: No results found for: HGBA1C, MPG No results found for: PROLACTIN No results found for: CHOL, TRIG, HDL, CHOLHDL, VLDL, LDLCALC No results found for: TSH  Therapeutic Level Labs: No results found for: LITHIUM No results found for: VALPROATE No components found for:  CBMZ  Current Medications: Current Outpatient Medications   Medication Sig Dispense Refill  . aspirin EC 81 MG tablet Take 162 mg by mouth every evening.     Marland Kitchen atorvastatin (LIPITOR) 80 MG tablet Take 80 mg by mouth every evening.     Marland Kitchen azithromycin (ZITHROMAX) 250 MG tablet Take by mouth. (Patient not taking: Reported on 04/26/2021)    . Cholecalciferol (VITAMIN D3) 1.25 MG (50000 UT) CAPS Take 1 tablet by mouth once a week.    . clopidogrel (PLAVIX) 75 MG tablet Take 1 tablet (75 mg total) by mouth daily. 90 tablet 3  . DULoxetine (CYMBALTA) 30 MG capsule Take 1 capsule (30 mg total) by mouth daily. 90 capsule 1  . ezetimibe (ZETIA) 10 MG tablet Take 10 mg by mouth daily.     . fluticasone (FLONASE) 50 MCG/ACT nasal spray Place 1 spray into both nostrils daily as needed for allergies.    Marland Kitchen  hydrOXYzine (VISTARIL) 25 MG capsule Take 1 capsule (25 mg total) by mouth as directed. Take 1 capsule daily as needed for anxiety and 2 capsules at bedtime as needed for sleep 100 capsule 1  . meclizine (ANTIVERT) 25 MG tablet Take 25 mg by mouth as needed for dizziness.    . metoprolol succinate (TOPROL-XL) 50 MG 24 hr tablet Take 50 mg by mouth daily.    . Multiple Vitamin (MULTIVITAMIN) tablet Take 1 tablet by mouth daily. Centrum Silver for Women    . nitroGLYCERIN (NITROSTAT) 0.4 MG SL tablet Place 0.4 mg under the tongue every 5 (five) minutes x 3 doses as needed for chest pain.     . Probiotic Product (PROBIOTIC DAILY PO) Take 1 capsule by mouth daily.    . promethazine (PHENERGAN) 25 MG tablet Take 25 mg by mouth every 8 (eight) hours as needed for nausea or vomiting.    . traMADol HCl 100 MG TABS Take 1.5 tablets by mouth 2 (two) times daily as needed. 90 tablet 2  . traZODone (DESYREL) 100 MG tablet Take 1 tablet (100 mg total) by mouth at bedtime. 90 tablet 1   No current facility-administered medications for this visit.     Musculoskeletal: Strength & Muscle Tone: UTA Gait & Station: UTA Patient leans: N/A  Psychiatric Specialty Exam: Review of  Systems  Musculoskeletal: Positive for back pain.  Psychiatric/Behavioral: The patient is nervous/anxious.   All other systems reviewed and are negative.   There were no vitals taken for this visit.There is no height or weight on file to calculate BMI.  General Appearance: Casual  Eye Contact:  Fair  Speech:  Clear and Coherent  Volume:  Normal  Mood:  Anxious improving  Affect:  Appropriate  Thought Process:  Goal Directed and Descriptions of Associations: Intact  Orientation:  Full (Time, Place, and Person)  Thought Content: Logical   Suicidal Thoughts:  No  Homicidal Thoughts:  No  Memory:  Immediate;   Fair Recent;   Fair Remote;   Fair  Judgement:  Fair  Insight:  Fair  Psychomotor Activity:  Normal  Concentration:  Concentration: Fair and Attention Span: Fair  Recall:  [employer] of Knowledge: Fair  Language: Fair  Akathisia:  No  Handed:  Right  AIMS (if indicated): UTA  Assets:  Communication Skills Desire for Improvement Housing Social Support  ADL's:  Intact  Cognition: WNL  Sleep:  Fair   Screenings: GAD-7   Flowsheet Row Video Visit from 04/26/2021 in [facility]  Total GAD-7 Score 1    PHQ2-9   Flowsheet Row Video Visit from 04/26/2021 in [facility] Office Visit from 02/07/2021 in [facility] Video Visit from 01/23/2021 in [facility] Office Visit from 06/28/2020 in [facility] Procedure visit from 04/11/2020 in [facility]  PHQ-2 Total Score 1 0 1 0 0    Flowsheet Row Video Visit from 01/23/2021 in [facility] from 12/31/2020 in [facility] Admission (Discharged) from 12/14/2020 in [facility] No Risk No Risk No Risk       Assessment  and Plan: Aireanna Luellen is a 65 year old Caucasian female, disabled, divorced, lives in [place], has a history of bipolar disorder, GAD, aortic aneurysm status post recent repair, diverticulitis, MI status post stent placement, chronic pain, recent  COVID-19 infection was evaluated by telemedicine today.  Patient is currently making progress although continues to have chronic pain, reports medications as effective.  Plan as noted below.  Plan GAD-stable Cymbalta 30 mg p.o. daily Hydroxyzine 25 mg p.o. daily as needed for severe anxiety and sleep  Bipolar disorder in remission We will monitor closely We will consider adding a mood stabilizer as needed.  Insomnia-stable Trazodone 100 mg p.o. nightly as needed Hydroxyzine 25 mg p.o. nightly as needed  Follow-up in clinic in 3 months or sooner if needed.  This note was generated in part or whole with voice recognition software. Voice recognition is usually quite accurate but there are transcription errors that can and very often do occur. I apologize for any typographical errors that were not detected and corrected.      Ursula Alert, MD 04/26/2021, 2:26 PM

## 2021-04-26 NOTE — Telephone Encounter (Signed)
Called patient for follow up. I offered to see if we could move her appointment up from the 21st of this month for her medication refill and she declined stating that she could wait until the 21 st for her refill.

## 2021-04-26 NOTE — Patient Instructions (Signed)
Serotonin Syndrome Serotonin is a chemical in your body (neurotransmitter) that helps to control several functions, such as:  Brain and nerve cell function.  Mood and emotions.  Memory.  Eating.  Sleeping.  Sexual activity.  Stress response. Having too much serotonin in your body can cause serotonin syndrome. This condition can be harmful to your brain and nerve cells. This can be a life-threatening condition. What are the causes? This condition may be caused by taking medicines or drugs that increase the level of serotonin in your body, such as:  Antidepressant medicines.  Migraine medicines.  Certain pain medicines.  Certain drugs, including ecstasy, LSD, cocaine, and amphetamines.  Over-the-counter cough or cold medicines that contain dextromethorphan.  Certain herbal supplements, including St. John's wort, ginseng, and nutmeg. This condition usually occurs when you take these medicines or drugs in combination, but it can also happen with a high dose of a single medicine or drug. What increases the risk? You are more likely to develop this condition if:  You just started taking a medicine or drug that increases the level of serotonin in the body.  You recently increased the dose of a medicine or drug that increases the level of serotonin in the body.  You take more than one medicine or drug that increases the level of serotonin in the body. What are the signs or symptoms? Symptoms of this condition usually start within several hours of taking a medicine or drug. Symptoms may be mild or severe. Mild symptoms include:  Sweating.  Restlessness or agitation.  Muscle twitching or stiffness.  Rapid heart rate.  Nausea and vomiting.  Diarrhea.  Headache.  Shivering or goose bumps.  Confusion. Severe symptoms include:  Irregular heartbeat.  Seizures.  Loss of consciousness.  High fever. How is this diagnosed? This condition may be diagnosed based  on:  Your medical history.  A physical exam.  Your prior use of drugs and medicines.  Blood or urine tests. These may be used to rule out other causes of your symptoms. How is this treated? The treatment for this condition depends on the severity of your symptoms.  For mild cases, stopping the medicine or drug that caused your condition is usually all that is needed.  For moderate to severe cases, treatment in a hospital may be needed to prevent or manage life-threatening symptoms. This may include medicines to control your symptoms, IV fluids, interventions to support your breathing, and treatments to control your body temperature. Follow these instructions at home: Medicines  Take over-the-counter and prescription medicines only as told by your health care provider. This is important.  Check with your health care provider before you start taking any new prescriptions, over-the-counter medicines, herbs, or supplements.  Avoid combining any medicines that can cause this condition to occur.   Lifestyle  Maintain a healthy lifestyle. ? Eat a healthy diet that includes plenty of vegetables, fruits, whole grains, low-fat dairy products, and lean protein. Do not eat a lot of foods that are high in fat, added sugars, or salt. ? Get the right amount and quality of sleep. Most adults need 7-9 hours of sleep each night. ? Make time to exercise, even if it is only for short periods of time. Most adults should exercise for at least 150 minutes each week. ? Do not drink alcohol. ? Do not use illegal drugs, and do not take medicines for reasons other than they are prescribed.   General instructions  Do not use any products that   contain nicotine or tobacco, such as cigarettes and e-cigarettes. If you need help quitting, ask your health care provider.  Keep all follow-up visits as told by your health care provider. This is important. Contact a health care provider if:  Your symptoms do not  improve or they get worse. Get help right away if you:  Have worsening confusion, severe headache, chest pain, high fever, seizures, or loss of consciousness.  Experience serious side effects of medicine, such as swelling of your face, lips, tongue, or throat.  Have serious thoughts about hurting yourself or others. These symptoms may represent a serious problem that is an emergency. Do not wait to see if the symptoms will go away. Get medical help right away. Call your local emergency services (911 in the [place] Do not drive yourself to the hospital. If you ever feel like you may hurt yourself or others, or have thoughts about taking your own life, get help right away. You can go to your nearest emergency department or call:  Your local emergency services (911 in the [place]  A suicide crisis helpline, such as the National Suicide Prevention Lifeline at 1-800-273-8255. This is open 24 hours a day. Summary  Serotonin is a brain chemical that helps to regulate the nervous system. High levels of serotonin in the body can cause serotonin syndrome, which is a very dangerous condition.  This condition may be caused by taking medicines or drugs that increase the level of serotonin in your body.  Treatment depends on the severity of your symptoms. For mild cases, stopping the medicine or drug that caused your condition is usually all that is needed.  Check with your health care provider before you start taking any new prescriptions, over-the-counter medicines, herbs, or supplements. This information is not intended to replace advice given to you by your health care provider. Make sure you discuss any questions you have with your health care provider. Document Revised: 12/13/2017 Document Reviewed: 12/13/2017 Elsevier Patient Education  2021 Elsevier Inc.  

## 2021-04-27 ENCOUNTER — Telehealth: Payer: Self-pay

## 2021-04-27 NOTE — Telephone Encounter (Signed)
received fax requesting a prior auth for the hydroxyzine pamoate 25mg 

## 2021-04-27 NOTE — Telephone Encounter (Signed)
received fax that prior auth for the hydroxyzine was approved from 11-19-20 to  11-18-21

## 2021-04-27 NOTE — Telephone Encounter (Signed)
went online to [email or web address] and submitted the prior auth . - pending 

## 2021-04-28 ENCOUNTER — Other Ambulatory Visit: Payer: Self-pay

## 2021-04-28 ENCOUNTER — Inpatient Hospital Stay: Payer: Medicare HMO | Attending: Internal Medicine

## 2021-04-28 DIAGNOSIS — D472 Monoclonal gammopathy: Secondary | ICD-10-CM

## 2021-04-28 LAB — COMPREHENSIVE METABOLIC PANEL
ALT: 16 U/L (ref 0–44)
AST: 22 U/L (ref 15–41)
Albumin: 3.8 g/dL (ref 3.5–5.0)
Alkaline Phosphatase: 119 U/L (ref 38–126)
Anion gap: 10 (ref 5–15)
BUN: 18 mg/dL (ref 8–23)
CO2: 28 mmol/L (ref 22–32)
Calcium: 9.2 mg/dL (ref 8.9–10.3)
Chloride: 100 mmol/L (ref 98–111)
Creatinine, Ser: 0.77 mg/dL (ref 0.44–1.00)
GFR, Estimated: >60 mL/min (ref >60)
Glucose, Bld: 123 mg/dL — ABNORMAL HIGH (ref 70–99)
Potassium: 4 mmol/L (ref 3.5–5.1)
Sodium: 138 mmol/L (ref 135–145)
Total Bilirubin: 0.5 mg/dL (ref 0.3–1.2)
Total Protein: 7 g/dL (ref 6.5–8.1)

## 2021-04-28 LAB — CBC WITH DIFFERENTIAL/PLATELET
Abs Immature Granulocytes: 0.02 10*3/uL (ref 0.00–0.07)
Basophils Absolute: 0.1 10*3/uL (ref 0.0–0.1)
Basophils Relative: 1 %
Eosinophils Absolute: 0.4 10*3/uL (ref 0.0–0.5)
Eosinophils Relative: 4 %
HCT: 41.1 % (ref 36.0–46.0)
Hemoglobin: 13.9 g/dL (ref 12.0–15.0)
Immature Granulocytes: 0 %
Lymphocytes Relative: 21 %
Lymphs Abs: 1.9 10*3/uL (ref 0.7–4.0)
MCH: 29.6 pg (ref 26.0–34.0)
MCHC: 33.8 g/dL (ref 30.0–36.0)
MCV: 87.4 fL (ref 80.0–100.0)
Monocytes Absolute: 0.5 10*3/uL (ref 0.1–1.0)
Monocytes Relative: 6 %
Neutro Abs: 6.1 10*3/uL (ref 1.7–7.7)
Neutrophils Relative %: 68 %
Platelets: 270 10*3/uL (ref 150–400)
RBC: 4.7 MIL/uL (ref 3.87–5.11)
RDW: 13.5 % (ref 11.5–15.5)
WBC: 9 10*3/uL (ref 4.0–10.5)
nRBC: 0 % (ref 0.0–0.2)

## 2021-05-01 ENCOUNTER — Encounter: Payer: Self-pay | Admitting: Internal Medicine

## 2021-05-01 LAB — KAPPA/LAMBDA LIGHT CHAINS
Kappa free light chain: 16.2 mg/L (ref 3.3–19.4)
Kappa, lambda light chain ratio: 1.12 (ref 0.26–1.65)
Lambda free light chains: 14.5 mg/L (ref 5.7–26.3)

## 2021-05-02 LAB — MULTIPLE MYELOMA PANEL, SERUM
Albumin SerPl Elph-Mcnc: 3.6 g/dL (ref 2.9–4.4)
Albumin/Glob SerPl: 1.4 (ref 0.7–1.7)
Alpha 1: 0.2 g/dL (ref 0.0–0.4)
Alpha2 Glob SerPl Elph-Mcnc: 0.9 g/dL (ref 0.4–1.0)
B-Globulin SerPl Elph-Mcnc: 0.8 g/dL (ref 0.7–1.3)
Gamma Glob SerPl Elph-Mcnc: 0.7 g/dL (ref 0.4–1.8)
Globulin, Total: 2.6 g/dL (ref 2.2–3.9)
IgA: 205 mg/dL (ref 87–352)
IgG (Immunoglobin G), Serum: 781 mg/dL (ref 586–1602)
IgM (Immunoglobulin M), Srm: 87 mg/dL (ref 26–217)
Total Protein ELP: 6.2 g/dL (ref 6.0–8.5)

## 2021-05-05 ENCOUNTER — Inpatient Hospital Stay: Payer: Medicare HMO | Admitting: Internal Medicine

## 2021-05-09 ENCOUNTER — Encounter: Payer: Self-pay | Admitting: Student in an Organized Health Care Education/Training Program

## 2021-05-09 ENCOUNTER — Ambulatory Visit
Payer: Medicare HMO | Attending: Student in an Organized Health Care Education/Training Program | Admitting: Student in an Organized Health Care Education/Training Program

## 2021-05-09 ENCOUNTER — Other Ambulatory Visit: Payer: Self-pay

## 2021-05-09 VITALS — BP 112/85 | HR 100 | Temp 97.2°F | Resp 16 | Ht 62.0 in | Wt 100.0 lb

## 2021-05-09 DIAGNOSIS — M25551 Pain in right hip: Secondary | ICD-10-CM

## 2021-05-09 DIAGNOSIS — M160 Bilateral primary osteoarthritis of hip: Secondary | ICD-10-CM | POA: Diagnosis not present

## 2021-05-09 DIAGNOSIS — M47816 Spondylosis without myelopathy or radiculopathy, lumbar region: Secondary | ICD-10-CM

## 2021-05-09 DIAGNOSIS — M25511 Pain in right shoulder: Secondary | ICD-10-CM | POA: Diagnosis not present

## 2021-05-09 DIAGNOSIS — G8929 Other chronic pain: Secondary | ICD-10-CM

## 2021-05-09 DIAGNOSIS — M25552 Pain in left hip: Secondary | ICD-10-CM

## 2021-05-09 DIAGNOSIS — G894 Chronic pain syndrome: Secondary | ICD-10-CM

## 2021-05-09 MED ORDER — TRAMADOL HCL 100 MG PO TABS: 1.5000 | ORAL_TABLET | Freq: Two times a day (BID) | ORAL | 2 refills | Status: DC | PRN

## 2021-05-09 NOTE — Progress Notes (Signed)
PROVIDER NOTE: Information contained herein reflects review and annotations entered in association with encounter. Interpretation of such information and data should be left to medically-trained personnel. Information provided to patient can be located elsewhere in the medical record under "Patient Instructions". Document created using STT-dictation technology, any transcriptional errors that may result from process are unintentional.    Patient: Melissa Montes  Service Category: E/M  Provider: Gillis Santa, MD  DOB: 1956-03-27  DOS: 05/09/2021  Specialty: Interventional Pain Management  MRN: [id number]  Setting: Ambulatory outpatient  PCP: Perrin Maltese, MD  Type: Established Patient    Referring Provider: Perrin Maltese, MD  Location: Office  Delivery: Face-to-face     HPI  Melissa Montes, a 65 y.o. year old female, is here today because of her Bilateral hip pain [M25.551, M25.552]. Ms. Barrantes's primary complain today is Back Pain (low) Last encounter: My last encounter with her was on 02/07/21 Pertinent problems: Melissa Montes has S/P coronary artery stent placement; Peripheral vascular disease ([place]); GAD (generalized anxiety disorder); S/P coronary artery bypass graft x 2; Osteoporosis, post-menopausal; Chronic pain syndrome; Lumbar facet arthropathy; Bilateral hip pain; Primary osteoarthritis of both hips; Chronic left shoulder pain; Right rotator cuff tear arthropathy; Left rotator cuff tear arthropathy; and Lumbar degenerative disc disease on their pertinent problem list. Pain Assessment: Severity of Chronic pain is reported as a 6 /10. Location: Back Lower, Right, Left/denies. Onset: More than a month ago. Quality: Aching. Timing: Intermittent. Modifying factor(s): movement. Vitals:  height is _0  (1.575 m) and weight is 100 lb (45.4 kg). Her temporal temperature is 97.2 F (36.2 C) (abnormal). Her blood pressure is 112/85 and her pulse is 100. Her respiration is 16 and oxygen  saturation is 98%.   Reason for encounter: medication management.    No change in medical history since last visit.  Patient's pain is at baseline.  Patient continues multimodal pain regimen as prescribed.  States that it provides pain relief and improvement in functional status.   Pharmacotherapy Assessment   Analgesic: Tramadol 100 mg TID PRN #90/month MME 30   Monitoring: [place] PMP: PDMP reviewed during this encounter.       Pharmacotherapy: No side-effects or adverse reactions reported. Compliance: No problems identified. Effectiveness: Clinically acceptable.  Dewayne Shorter, RN  05/09/2021  2:39 PM  Sign when Signing Visit Nursing Pain Medication Assessment:  Safety precautions to be maintained throughout the outpatient stay will include: orient to surroundings, keep bed in low position, maintain call bell within reach at all times, provide assistance with transfer out of bed and ambulation.  Medication Inspection Compliance: Melissa Montes did not comply with our request to bring her pills to be counted. She was reminded that bringing the medication bottles, even when empty, is a requirement.  Medication: None brought in. Pill/Patch Count: None available to be counted. Bottle Appearance: No container available. Did not bring bottle(s) to appointment. Filled Date: N/A Last Medication intake:  Today   UDS:  Summary  Date Value Ref Range Status  02/07/2021 Note  Final    Comment:    ==================================================================== [employer] Select 13 (MW) ==================================================================== Test                             Result       Flag       Units  Drug Present and Declared for Prescription Verification   Tramadol                       >  2907        EXPECTED   ng/mg creat   O-Desmethyltramadol            >2907        EXPECTED   ng/mg creat   N-Desmethyltramadol            >2907        EXPECTED   ng/mg creat    Source of  tramadol is a prescription medication. O-desmethyltramadol    and N-desmethyltramadol are expected metabolites of tramadol.  ==================================================================== Test                      Result    Flag   Units      Ref Range   Creatinine              172              mg/dL      >=20 ==================================================================== Declared Medications:  The flagging and interpretation on this report are based on the  following declared medications.  Unexpected results may arise from  inaccuracies in the declared medications.   **Note: The testing scope of this panel includes these medications:   Tramadol   **Note: The testing scope of this panel does not include the  following reported medications:   Aspirin  Atorvastatin  Clopidogrel  Duloxetine  Ezetimibe  Fluticasone  Hydroxyzine  Meclizine  Metoprolol  Multivitamin  Nitroglycerin  Probiotic  Promethazine  Trazodone  Vitamin D3 ==================================================================== For clinical consultation, please call [phone]. ====================================================================      ROS  Constitutional: Denies any fever or chills Gastrointestinal: No reported hemesis, hematochezia, vomiting, or acute GI distress Musculoskeletal:  Low back, bilateral SI joint pain Neurological: No reported episodes of acute onset apraxia, aphasia, dysarthria, agnosia, amnesia, paralysis, loss of coordination, or loss of consciousness  Medication Review  DULoxetine, Probiotic Product, Vitamin D3, aspirin EC, atorvastatin, azithromycin, clopidogrel, ezetimibe, fluticasone, hydrOXYzine, meclizine, metoprolol succinate, multivitamin, nitroGLYCERIN, promethazine, traMADol HCl, and traZODone  History Review  Allergy: Melissa Montes is allergic to effexor xr [venlafaxine hcl], penicillins, amoxicillin-pot clavulanate, and keflex [cephalexin]. Drug:  Melissa Montes  reports no history of drug use. Alcohol:  reports no history of alcohol use. Tobacco:  reports that she quit smoking about 2 years ago. Her smoking use included cigarettes. She has a 41.00 pack-year smoking history. She has never used smokeless tobacco. Social: Ms. Graffam  reports that she quit smoking about 2 years ago. Her smoking use included cigarettes. She has a 41.00 pack-year smoking history. She has never used smokeless tobacco. She reports that she does not drink alcohol and does not use drugs. Medical:  has a past medical history of ADD (attention deficit disorder), Anxiety, Aortic aneurysm ([place]), Atherosclerosis of abdominal aorta ([place]), Bipolar disorder ([place]), Bulging of lumbar intervertebral disc, CAD (coronary artery disease), Cancer ([place]), COPD (chronic obstructive pulmonary disease) ([place]), Depression, Diverticulitis, Dyspnea, History of kidney stones, Hyperlipidemia, Hypertension, Insomnia, Lumbar spondylosis, MI (myocardial infarction) ([place]), Migraine, Osteoporosis, PONV (postoperative nausea and vomiting), PVD (peripheral vascular disease) ([place]), 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 Surgical: Ms. Bernstein  has a past surgical history that includes Coronary angioplasty with stent; Abdominal hysterectomy; CAROTID ANGIOGRAPHY (N/A, 01/13/2018); Left Heart Cath (Right, 07/07/2018); Colonoscopy; iv INJECTIONS TO BACK; and ENDOVASCULAR REPAIR/STENT GRAFT (Bilateral, 12/14/2020). Family: family history includes Breast cancer in her maternal aunt; Breast cancer (age of onset: 80)  in her mother; Depression in her sister.  Laboratory Chemistry Profile   Renal Lab Results  Component Value Date   BUN 18 04/28/2021   CREATININE 0.77 04/28/2021   GFRAA >60 07/17/2020   GFRNONAA >60 04/28/2021     Hepatic Lab Results  Component Value Date   AST 22 04/28/2021   ALT 16  04/28/2021   ALBUMIN 3.8 04/28/2021   ALKPHOS 119 04/28/2021     Electrolytes Lab Results  Component Value Date   NA 138 04/28/2021   K 4.0 04/28/2021   CL 100 04/28/2021   CALCIUM 9.2 04/28/2021     Bone No results found for: VD25OH, VD125OH2TOT, YQ6578IO9, GE9528UX3, 25OHVITD1, 25OHVITD2, 25OHVITD3, TESTOFREE, TESTOSTERONE   Inflammation (CRP: Acute Phase) (ESR: Chronic Phase) No results found for: CRP, ESRSEDRATE, LATICACIDVEN     Note: Above Lab results reviewed.  Recent Imaging Review  MR BRAIN/IAC W WO CONTRAST CLINICAL DATA:  Double vision over the last 8 months. Left fourth nerve palsy. Dizziness with falling.  EXAM: MRI HEAD WITHOUT AND WITH CONTRAST  TECHNIQUE: Multiplanar, multiecho pulse sequences of the brain and surrounding structures were obtained without and with intravenous contrast.  CONTRAST:  63m GADAVIST GADOBUTROL 1 MMOL/ML IV SOLN  COMPARISON:  Head CT 12/31/2020  FINDINGS: Brain: Diffusion imaging does not show any acute or subacute infarction or other cause of restricted diffusion. The brainstem and cerebellum appear normal. Perimesencephalic cistern regions appear normal. No abnormality seen of the skull base or cavernous sinus regions.  Cerebral hemispheres are normal except for a 1 cm region of T2 and FLAIR signal within the deep white matter just above the mid body of the right lateral ventricle. This is nonspecific and at this age would most commonly represent an old white matter infarction. Demyelination is not excluded but there are no second lesions or characteristic pattern. No evidence of mass, hemorrhage, hydrocephalus or extra-axial collection.  Detailed images of the fourth nerve course do not show any abnormal finding. Elsewhere, no abnormal enhancement of the brain or leptomeninges.  Vascular: Major vessels at the base of the brain show flow. The examination was not done as an MR angiogram, but there is no finding to  suggest aneurysm.  Skull and upper cervical spine: Normal  Sinuses/Orbits: Small amount of fluid in the left division of the sphenoid sinus. Mild mucosal thickening at floor of the left maxillary sinus. Orbits negative.  Other: None  IMPRESSION: 1. No abnormality seen to explain the clinical presentation. No cause of left cranial nerve IV abnormality seen. 2. 1 cm region of T2 and FLAIR signal within the deep white matter just above the mid body of the right lateral ventricle. This is nonspecific and could represent an old white matter infarction. Demyelination is not excluded but there are no second lesions or any characteristic pattern.  Electronically Signed   By: MNelson ChimesM.D.   On: 02/03/2021 16:11 Note: Reviewed        Physical Exam  General appearance: Well nourished, well developed, and well hydrated. In no apparent acute distress Mental status: Alert, oriented x 3 (person, place, & time)       Respiratory: No evidence of acute respiratory distress Eyes: PERLA Vitals: BP 112/85   Pulse 100   Temp (!) 97.2 F (36.2 C) (Temporal)   Resp 16   Ht _0  (1.575 m)   Wt 100 lb (45.4 kg)   SpO2 98%   BMI 18.29 kg/m  BMI: Estimated body mass index is  18.29 kg/m as calculated from the following:   Height as of this encounter: _0  (1.575 m).   Weight as of this encounter: 100 lb (45.4 kg). Ideal: Female patients must weigh at least 45.5 kg to calculate ideal body weight   Low back pain, worse with lumbar extension. 5 out of 5 strength bilateral lower extremity: Plantar flexion, dorsiflexion, knee flexion, knee extension.  Assessment   Status Diagnosis  Controlled Controlled Controlled 1. Bilateral hip pain   2. Chronic pain syndrome   3. Lumbar facet arthropathy   4. Chronic right shoulder pain   5. Primary osteoarthritis of both hips       Plan of Care   Ms. Rosella Crandell Bonelli has a current medication list which includes the following long-term  medication(s): atorvastatin, duloxetine, ezetimibe, fluticasone, metoprolol succinate, nitroglycerin, promethazine, trazodone, and tramadol hcl.  Pharmacotherapy (Medications Ordered): Meds ordered this encounter  Medications   traMADol HCl 100 MG TABS    Sig: Take 1.5 tablets by mouth 2 (two) times daily as needed.    Dispense:  90 tablet    Refill:  2    For chronic pain syndrome   Follow-up plan:   Return in about 3 months (around 08/09/2021) for Medication Management, in person.     Status post bilateral L3, L4, L5 facet medial branch nerve block #2 3/15/202, 90% pain relief for 10-12 hrs, proceed with RFA.  Left L3, L4, L5 RFA 03/14/2020; Right L3,4,5 RFA 04/11/20.  Repeat as needed, consider sprint peripheral nerve stimulation of lumbar medial branch         Recent Visits No visits were found meeting these conditions. Showing recent visits within past 90 days and meeting all other requirements Today's Visits Date Type Provider Dept  05/09/21 Office Visit Gillis Santa, MD [facility]  Showing today's visits and meeting all other requirements Future Appointments Date Type Provider Dept  08/03/21 Appointment Gillis Santa, MD [facility]  Showing future appointments within next 90 days and meeting all other requirements I discussed the assessment and treatment plan with the patient. The patient was provided an opportunity to ask questions and all were answered. The patient agreed with the plan and demonstrated an understanding of the instructions.  Patient advised to call back or seek an in-person evaluation if the symptoms or condition worsens.  Duration of encounter: 30 minutes.  Note by: Gillis Santa, MD Date: 05/09/2021; Time: 3:05 PM

## 2021-05-09 NOTE — Progress Notes (Signed)
Nursing Pain Medication Assessment:  Safety precautions to be maintained throughout the outpatient stay will include: orient to surroundings, keep bed in low position, maintain call bell within reach at all times, provide assistance with transfer out of bed and ambulation.  Medication Inspection Compliance: Ms. Florance did not comply with our request to bring her pills to be counted. She was reminded that bringing the medication bottles, even when empty, is a requirement.  Medication: None brought in. Pill/Patch Count: None available to be counted. Bottle Appearance: No container available. Did not bring bottle(s) to appointment. Filled Date: N/A Last Medication intake:  Today

## 2021-05-15 ENCOUNTER — Encounter: Payer: Self-pay | Admitting: Internal Medicine

## 2021-05-15 ENCOUNTER — Inpatient Hospital Stay (HOSPITAL_BASED_OUTPATIENT_CLINIC_OR_DEPARTMENT_OTHER): Payer: Medicare HMO | Admitting: Internal Medicine

## 2021-05-15 ENCOUNTER — Other Ambulatory Visit: Payer: Self-pay

## 2021-05-15 DIAGNOSIS — D472 Monoclonal gammopathy: Secondary | ICD-10-CM | POA: Diagnosis not present

## 2021-05-15 NOTE — Assessment & Plan Note (Addendum)
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  Not quantified; however 3 subsequent myeloma work-up [2020; 2021; more recently 2022]-negative for any M protein/even on immunofixation.  Kappa (chain ratio normal.  CBC CMP normal.  Skeletal survey-2021-negative in the bone lesions.  Recommend follow-up with PCP regarding age-appropriate cancer screening.  # Smoker/lung cancer screening- quit 2 years ago-continue lung cancer screening per protocol..  # AAA- [noted on skeletal survey in 2021]-s/p repair January 2022  # DISPOSITION: # follow up as needed-Dr.B   Cc; Dr.Neelam Khan/[facility].

## 2021-05-15 NOTE — Progress Notes (Signed)
States she was having trouble with double vision a few months ago and went to eye doctor. They told her it looks like she could have had a stroke. Has not follow up with PCP yet.

## 2021-05-15 NOTE — Progress Notes (Signed)
I connected with Wanna Gully Caruth on 05/15/21 at  2:45 PM EDT by video enabled telemedicine visit and verified that I am speaking with the correct person using two identifiers.  I discussed the limitations, risks, security and privacy concerns of performing an evaluation and management service by telemedicine and the availability of in-person appointments. I also discussed with the patient that there may be a patient responsible charge related to this service. The patient expressed understanding and agreed to proceed.    Other persons participating in the visit and their role in the encounter: RN/medical reconciliation Patient's location: home Provider's location: office  Oncology History   No history exists.     Chief Complaint: Possible MGUS   History of present illness:Melissa Montes 65 y.o.  female with history of possible MGUS is here for follow-up.  The interim patient was evaluated by ophthalmology for double vision/noted to have possible stroke on MRI.  She is awaiting to follow-up with PCP.  Otherwise denies any bone pain or joint pains.  Denies any tingling numbness extremities.  Observation/objective: Alert & oriented x 3. In No acute distress.   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  Not quantified; however 3 subsequent myeloma work-up [2020; 2021; more recently 2022]-negative for any M protein/even on immunofixation.  Kappa (chain ratio normal.  CBC CMP normal.  Skeletal survey-2021-negative in the bone lesions.  Recommend follow-up with PCP regarding age-appropriate cancer screening.  # Smoker/lung cancer screening- quit 2 years ago-continue lung cancer screening per protocol..  # AAA- [noted on skeletal survey in 2021]-s/p repair January 2022  # DISPOSITION: # follow up as needed-Dr.B   Cc; Dr.Neelam Khan/[place].   Follow-up  instructions:  I discussed the assessment and treatment plan with the patient.  The patient was provided an opportunity to ask questions and all were answered.  The patient agreed with the plan and demonstrated understanding of instructions.  The patient was advised to call back or seek an in person evaluation if the symptoms worsen or if the condition fails to improve as anticipated.  Dr. Charlaine Dalton [place] at [facility] 05/15/2021 3:15 PM

## 2021-05-29 ENCOUNTER — Telehealth: Payer: Self-pay | Admitting: *Deleted

## 2021-05-29 NOTE — Telephone Encounter (Signed)
Spoke to patient via telephone re: scheduling her annual low dose lung screening CT Scan. Patient stated that she was not at home and did not have her calendar with her. She will call back later today to schedule.

## 2021-07-07 ENCOUNTER — Other Ambulatory Visit (INDEPENDENT_AMBULATORY_CARE_PROVIDER_SITE_OTHER): Payer: Self-pay | Admitting: Nurse Practitioner

## 2021-07-07 DIAGNOSIS — I714 Abdominal aortic aneurysm, without rupture, unspecified: Secondary | ICD-10-CM

## 2021-07-14 ENCOUNTER — Ambulatory Visit (INDEPENDENT_AMBULATORY_CARE_PROVIDER_SITE_OTHER): Payer: Medicare HMO | Admitting: Vascular Surgery

## 2021-07-14 ENCOUNTER — Other Ambulatory Visit (INDEPENDENT_AMBULATORY_CARE_PROVIDER_SITE_OTHER): Payer: Medicare HMO

## 2021-07-18 ENCOUNTER — Other Ambulatory Visit (INDEPENDENT_AMBULATORY_CARE_PROVIDER_SITE_OTHER): Payer: Medicare HMO

## 2021-07-26 ENCOUNTER — Other Ambulatory Visit: Payer: Self-pay

## 2021-07-26 ENCOUNTER — Telehealth (INDEPENDENT_AMBULATORY_CARE_PROVIDER_SITE_OTHER): Payer: Medicare HMO | Admitting: Psychiatry

## 2021-07-26 ENCOUNTER — Encounter: Payer: Self-pay | Admitting: Psychiatry

## 2021-07-26 DIAGNOSIS — G4701 Insomnia due to medical condition: Secondary | ICD-10-CM

## 2021-07-26 DIAGNOSIS — F3176 Bipolar disorder, in full remission, most recent episode depressed: Secondary | ICD-10-CM | POA: Diagnosis not present

## 2021-07-26 DIAGNOSIS — F411 Generalized anxiety disorder: Secondary | ICD-10-CM

## 2021-07-26 DIAGNOSIS — R69 Illness, unspecified: Secondary | ICD-10-CM | POA: Diagnosis not present

## 2021-07-26 NOTE — Progress Notes (Signed)
Virtual Visit via Video Note  I connected with Melissa Montes on 07/26/21 at  2:00 PM EDT by a video enabled telemedicine application and verified that I am speaking with the correct person using two identifiers.  Location Provider Location : [facility] Patient Location : Home  Participants: Patient , Provider   I discussed the limitations of evaluation and management by telemedicine and the availability of in person appointments. The patient expressed understanding and agreed to proceed.    I discussed the assessment and treatment plan with the patient. The patient was provided an opportunity to ask questions and all were answered. The patient agreed with the plan and demonstrated an understanding of the instructions.   The patient was advised to call back or seek an in-person evaluation if the symptoms worsen or if the condition fails to improve as anticipated.    [facility] MD OP Progress Note  07/26/2021 2:15 PM Melissa Montes  MRN:  [id number]  Chief Complaint:  Chief Complaint   Follow-up; Anxiety    HPI: Melissa Montes is a 65 year old Caucasian female on disability, divorced, lives in [place], has a history of bipolar disorder, GAD, abdominal aortic aneurysm, diverticulitis, chronic pain was evaluated by telemedicine today.  Patient today reports she is currently doing well.  Denies any significant depression or anxiety symptoms.  Reports sleep and appetite is fair.  She continues to take her medications as prescribed.  Denies side effects.  She denies any suicidality, homicidality or perceptual disturbances.  Patient continues to have chronic pain and is currently under the care of Dr. Holley Raring.  Patient reports her niece recently moved closer to her and they are spending time together.  That has been helpful.  Patient denies any other concerns today.  Visit Diagnosis:    ICD-10-CM   1. GAD (generalized anxiety disorder)  F41.1     2. Bipolar disorder, in full  remission, most recent episode depressed (Melissa Montes)  F31.76     3. Insomnia due to medical condition  G47.01    pain      Past Psychiatric History: Reviewed past psychiatric history from progress note on 04/26/2020.  Past Medical History:  Past Medical History:  Diagnosis Date   ADD (attention deficit disorder)    Anxiety    Aortic aneurysm (HCC)    Atherosclerosis of abdominal aorta (HCC)    Bipolar disorder (HCC)    Bulging of lumbar intervertebral disc    CAD (coronary artery disease)    Cancer (HCC)    COPD (chronic obstructive pulmonary disease) (HCC)    Depression    Diverticulitis    Dyspnea    History of kidney stones    Hyperlipidemia    Hypertension    Insomnia    Lumbar spondylosis    MI (myocardial infarction) ([place])    2007, 2019   Migraine    Osteoporosis    PONV (postoperative nausea and vomiting)    PVD (peripheral vascular disease) (HCC)    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HCC)    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   SVT (supraventricular tachycardia) (HCC)     Past Surgical History:  Procedure Laterality Date   ABDOMINAL HYSTERECTOMY     CAROTID ANGIOGRAPHY N/A 01/13/2018   Procedure: CAROTID ANGIOGRAPHY;  Surgeon: Algernon Huxley, MD;  Location: [facility];  Service: Cardiovascular;  Laterality: N/A;   COLONOSCOPY     CORONARY ANGIOPLASTY WITH STENT PLACEMENT     ENDOVASCULAR REPAIR/STENT GRAFT  Bilateral 12/14/2020   Procedure: ENDOVASCULAR REPAIR/STENT GRAFT;  Surgeon: Algernon Huxley, MD;  Location: [facility];  Service: Cardiovascular;  Laterality: Bilateral;   iv INJECTIONS TO BACK     LEFT HEART CATH Right 07/07/2018   Procedure: Left Heart Cath and Coronary Angiography;  Surgeon: Dionisio David, MD;  Location: [facility];  Service: Cardiovascular;  Laterality: Right;    Family Psychiatric History: Reviewed family psychiatric history from progress note on 04/26/2020.  Family History:  Family  History  Problem Relation Age of Onset   Breast cancer Mother 67   Breast cancer Maternal Aunt        mat aunt and great aunt   Depression Sister     Social History: Reviewed social history from progress note on 04/26/2020. Social History   Socioeconomic History   Marital status: Divorced    Spouse name: Not on file   Number of children: Not on file   Years of education: Not on file   Highest education level: Not on file  Occupational History   Occupation: disabled  Tobacco Use   Smoking status: Former    Packs/day: 1.00    Years: 41.00    Pack years: 41.00    Types: Cigarettes    Quit date: 09/19/2018    Years since quitting: 2.8   Smokeless tobacco: Never   Tobacco comments:    uses nicotine gums  Substance and Sexual Activity   Alcohol use: No   Drug use: No   Sexual activity: Not on file  Other Topics Concern   Not on file  Social History Narrative   Not on file   Social Determinants of Health   Financial Resource Strain: Not on file  Food Insecurity: Not on file  Transportation Needs: Not on file  Physical Activity: Not on file  Stress: Not on file  Social Connections: Not on file    Allergies:  Allergies  Allergen Reactions   Effexor Xr [Venlafaxine Hcl]     suicidality   Penicillins Swelling   Amoxicillin-Pot Clavulanate    Keflex [Cephalexin] Hives    Metabolic Disorder Labs: No results found for: HGBA1C, MPG No results found for: PROLACTIN No results found for: CHOL, TRIG, HDL, CHOLHDL, VLDL, LDLCALC No results found for: TSH  Therapeutic Level Labs: No results found for: LITHIUM No results found for: VALPROATE No components found for:  CBMZ  Current Medications: Current Outpatient Medications  Medication Sig Dispense Refill   aspirin EC 81 MG tablet Take 162 mg by mouth every evening.      atorvastatin (LIPITOR) 80 MG tablet Take 80 mg by mouth every evening.      Cholecalciferol (VITAMIN D3) 1.25 MG (50000 UT) CAPS Take 1 tablet by  mouth once a week.     clopidogrel (PLAVIX) 75 MG tablet Take 1 tablet (75 mg total) by mouth daily. 90 tablet 3   DULoxetine (CYMBALTA) 30 MG capsule Take 1 capsule (30 mg total) by mouth daily. 90 capsule 1   ezetimibe (ZETIA) 10 MG tablet Take 10 mg by mouth daily.      fluticasone (FLONASE) 50 MCG/ACT nasal spray Place 1 spray into both nostrils daily as needed for allergies.     hydrOXYzine (VISTARIL) 25 MG capsule Take 1 capsule (25 mg total) by mouth as directed. Take 1 capsule daily as needed for anxiety and 2 capsules at bedtime as needed for sleep 100 capsule 1   metoprolol succinate (TOPROL-XL) 50 MG 24  hr tablet Take 50 mg by mouth daily.     Multiple Vitamin (MULTIVITAMIN) tablet Take 1 tablet by mouth daily. Centrum Silver for Women     nitroGLYCERIN (NITROSTAT) 0.4 MG SL tablet Place 0.4 mg under the tongue every 5 (five) minutes x 3 doses as needed for chest pain.      Probiotic Product (PROBIOTIC DAILY PO) Take 1 capsule by mouth daily.     promethazine (PHENERGAN) 25 MG tablet Take 25 mg by mouth every 8 (eight) hours as needed for nausea or vomiting.     traMADol HCl 100 MG TABS Take 1.5 tablets by mouth 2 (two) times daily as needed. 90 tablet 2   traZODone (DESYREL) 100 MG tablet Take 1 tablet (100 mg total) by mouth at bedtime. 90 tablet 1   No current facility-administered medications for this visit.     Musculoskeletal: Strength & Muscle Tone: uta Gait & Station: normal Patient leans: N/A  Psychiatric Specialty Exam: Review of Systems  Psychiatric/Behavioral:  Negative for agitation, behavioral problems, confusion, decreased concentration, dysphoric mood, hallucinations, self-injury, sleep disturbance and suicidal ideas. The patient is not nervous/anxious and is not hyperactive.   All other systems reviewed and are negative.  There were no vitals taken for this visit.There is no height or weight on file to calculate BMI.  General Appearance: Casual  Eye Contact:   Good  Speech:  Clear and Coherent  Volume:  Normal  Mood:  Euthymic  Affect:  Congruent  Thought Process:  Goal Directed and Descriptions of Associations: Intact  Orientation:  Full (Time, Place, and Person)  Thought Content: Logical   Suicidal Thoughts:  No  Homicidal Thoughts:  No  Memory:  Immediate;   Fair Recent;   Fair Remote;   Fair  Judgement:  Fair  Insight:  Fair  Psychomotor Activity:  Normal  Concentration:  Concentration: Fair and Attention Span: Fair  Recall:  [employer] of Knowledge: Fair  Language: Fair  Akathisia:  No  Handed:  Right  AIMS (if indicated): done  Assets:  Communication Skills Desire for Improvement Housing Social Support  ADL's:  Intact  Cognition: WNL  Sleep:  Fair   Screenings: GAD-7    Flowsheet Row Video Visit from 04/26/2021 in [facility]  Total GAD-7 Score 1      PHQ2-9    [facility] Visit from 05/09/2021 in [facility] Video Visit from 04/26/2021 in [facility] Office Visit from 02/07/2021 in [facility] Video Visit from 01/23/2021 in [facility] Office Visit from 06/28/2020 in [facility]  PHQ-2 Total Score 0 1 0 1 0      Flowsheet Row Video Visit from 01/23/2021 in [facility] from 12/31/2020 in [facility] Admission (Discharged) from 12/14/2020 in [facility] No Risk No Risk No Risk        Assessment and Plan: Melissa Montes is a 65 year old Caucasian female, disabled, divorced, lives in [place], has a history of bipolar disorder, GAD, aortic aneurysm status post recent repair, diverticulitis, MI status post stent placement, chronic pain, was evaluated by telemedicine today.  Patient is  currently stable.  Plan as noted below.  Plan GAD-stable Cymbalta 30 mg p.o. daily Hydroxyzine 25 mg p.o. daily as needed for severe anxiety and sleep AIMS -0  Bipolar disorder  in remission Patient is not interested in adding a mood stabilizer at this time since her symptoms has been in remission. However we will consider a mood stabilizer as needed.  Insomnia-stable Trazodone 100 mg p.o. nightly as needed Hydroxyzine 25 mg p.o. nightly as needed  Patient will benefit from getting TSH labs-she prefers to get it from her primary care provider's office.  Follow-up in clinic in 6 months in person.  This note was generated in part or whole with voice recognition software. Voice recognition is usually quite accurate but there are transcription errors that can and very often do occur. I apologize for any typographical errors that were not detected and corrected.       Ursula Alert, MD 07/26/2021, 2:15 PM

## 2021-07-28 ENCOUNTER — Other Ambulatory Visit: Payer: Self-pay | Admitting: Psychiatry

## 2021-07-28 DIAGNOSIS — F411 Generalized anxiety disorder: Secondary | ICD-10-CM

## 2021-08-03 ENCOUNTER — Encounter: Payer: Medicare HMO | Admitting: Student in an Organized Health Care Education/Training Program

## 2021-08-10 ENCOUNTER — Other Ambulatory Visit: Payer: Self-pay | Admitting: Internal Medicine

## 2021-08-11 DIAGNOSIS — I2510 Atherosclerotic heart disease of native coronary artery without angina pectoris: Secondary | ICD-10-CM | POA: Diagnosis not present

## 2021-08-11 DIAGNOSIS — E782 Mixed hyperlipidemia: Secondary | ICD-10-CM | POA: Diagnosis not present

## 2021-08-11 DIAGNOSIS — E559 Vitamin D deficiency, unspecified: Secondary | ICD-10-CM | POA: Diagnosis not present

## 2021-08-11 DIAGNOSIS — I10 Essential (primary) hypertension: Secondary | ICD-10-CM | POA: Diagnosis not present

## 2021-08-11 DIAGNOSIS — N644 Mastodynia: Secondary | ICD-10-CM | POA: Diagnosis not present

## 2021-08-11 DIAGNOSIS — Z23 Encounter for immunization: Secondary | ICD-10-CM | POA: Diagnosis not present

## 2021-08-11 DIAGNOSIS — R5383 Other fatigue: Secondary | ICD-10-CM | POA: Diagnosis not present

## 2021-08-14 ENCOUNTER — Other Ambulatory Visit: Payer: Self-pay | Admitting: Internal Medicine

## 2021-08-14 DIAGNOSIS — N644 Mastodynia: Secondary | ICD-10-CM

## 2021-08-17 ENCOUNTER — Other Ambulatory Visit: Payer: Medicare HMO

## 2021-08-18 ENCOUNTER — Ambulatory Visit (INDEPENDENT_AMBULATORY_CARE_PROVIDER_SITE_OTHER): Payer: Medicare HMO | Admitting: Vascular Surgery

## 2021-08-18 ENCOUNTER — Encounter (INDEPENDENT_AMBULATORY_CARE_PROVIDER_SITE_OTHER): Payer: Self-pay | Admitting: Vascular Surgery

## 2021-08-18 ENCOUNTER — Other Ambulatory Visit: Payer: Self-pay

## 2021-08-18 ENCOUNTER — Ambulatory Visit (INDEPENDENT_AMBULATORY_CARE_PROVIDER_SITE_OTHER): Payer: Medicare HMO

## 2021-08-18 VITALS — BP 131/76 | HR 89 | Resp 15 | Wt 107.4 lb

## 2021-08-18 DIAGNOSIS — I10 Essential (primary) hypertension: Secondary | ICD-10-CM

## 2021-08-18 DIAGNOSIS — I714 Abdominal aortic aneurysm, without rupture, unspecified: Secondary | ICD-10-CM

## 2021-08-18 DIAGNOSIS — I6523 Occlusion and stenosis of bilateral carotid arteries: Secondary | ICD-10-CM | POA: Diagnosis not present

## 2021-08-18 DIAGNOSIS — E7800 Pure hypercholesterolemia, unspecified: Secondary | ICD-10-CM

## 2021-08-18 NOTE — Progress Notes (Signed)
MRN : [id number]  Melissa Montes is a 65 y.o. (07-15-1956) female who presents with chief complaint of  Chief Complaint  Patient presents with   Follow-up    Ultrasound follow up  .  History of Present Illness: Patient returns today in follow up of her abdominal aortic aneurysm.  She is about 7 to 8 months status post endovascular repair of her abdominal aortic aneurysm.  She is doing well.  She has no AAA related symptoms.  She has no new complaints today.  Her duplex shows a patent stent graft without endoleak and a significant decrease in the aortic sac size down to 3.2 cm in maximal diameter.  Current Outpatient Medications  Medication Sig Dispense Refill   aspirin EC 81 MG tablet Take 162 mg by mouth every evening.      atorvastatin (LIPITOR) 80 MG tablet Take 80 mg by mouth every evening.      Cholecalciferol (VITAMIN D3) 1.25 MG (50000 UT) CAPS Take 1 tablet by mouth once a week.     clopidogrel (PLAVIX) 75 MG tablet Take 1 tablet (75 mg total) by mouth daily. 90 tablet 3   DULoxetine (CYMBALTA) 30 MG capsule Take 1 capsule (30 mg total) by mouth daily. 90 capsule 1   ezetimibe (ZETIA) 10 MG tablet Take 10 mg by mouth daily.      fluticasone (FLONASE) 50 MCG/ACT nasal spray Place 1 spray into both nostrils daily as needed for allergies.     hydrOXYzine (VISTARIL) 25 MG capsule TAKE 1 CAPSULE BY MOUTH DAILY AS NEEDED FOR ANXIETY AND 2 CAPSULES AT BEDTIME AS NEEDED FOR SLEEP AS DIRECTED 90 capsule 1   metoprolol succinate (TOPROL-XL) 50 MG 24 hr tablet Take 50 mg by mouth daily.     Multiple Vitamin (MULTIVITAMIN) tablet Take 1 tablet by mouth daily. Centrum Silver for Women     nitroGLYCERIN (NITROSTAT) 0.4 MG SL tablet Place 0.4 mg under the tongue every 5 (five) minutes x 3 doses as needed for chest pain.      Probiotic Product (PROBIOTIC DAILY PO) Take 1 capsule by mouth daily.     promethazine (PHENERGAN) 25 MG tablet Take 25 mg by mouth every 8 (eight) hours as needed for  nausea or vomiting.     traZODone (DESYREL) 100 MG tablet Take 1 tablet (100 mg total) by mouth at bedtime. 90 tablet 1   traMADol HCl 100 MG TABS Take 1.5 tablets by mouth 2 (two) times daily as needed. 90 tablet 2   No current facility-administered medications for this visit.    Past Medical History:  Diagnosis Date   ADD (attention deficit disorder)    Anxiety    Aortic aneurysm (HCC)    Atherosclerosis of abdominal aorta (HCC)    Bipolar disorder ([place])    Bulging of lumbar intervertebral disc    CAD (coronary artery disease)    Cancer (HCC)    COPD (chronic obstructive pulmonary disease) (HCC)    Depression    Diverticulitis    Dyspnea    History of kidney stones    Hyperlipidemia    Hypertension    Insomnia    Lumbar spondylosis    MI (myocardial infarction) ([place])    2007, 2019   Migraine    Osteoporosis    PONV (postoperative nausea and vomiting)    PVD (peripheral vascular disease) (HCC)    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HCC)    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  SVT (supraventricular tachycardia) (HCC)     Past Surgical History:  Procedure Laterality Date   ABDOMINAL HYSTERECTOMY     CAROTID ANGIOGRAPHY N/A 01/13/2018   Procedure: CAROTID ANGIOGRAPHY;  Surgeon: Algernon Huxley, MD;  Location: [facility];  Service: Cardiovascular;  Laterality: N/A;   COLONOSCOPY     CORONARY ANGIOPLASTY WITH STENT PLACEMENT     ENDOVASCULAR REPAIR/STENT GRAFT Bilateral 12/14/2020   Procedure: ENDOVASCULAR REPAIR/STENT GRAFT;  Surgeon: Algernon Huxley, MD;  Location: [facility];  Service: Cardiovascular;  Laterality: Bilateral;   iv INJECTIONS TO BACK     LEFT HEART CATH Right 07/07/2018   Procedure: Left Heart Cath and Coronary Angiography;  Surgeon: Dionisio David, MD;  Location: [facility];  Service: Cardiovascular;  Laterality: Right;     Social History   Tobacco Use   Smoking status: Former    Packs/day: 1.00     Years: 41.00    Pack years: 41.00    Types: Cigarettes    Quit date: 09/19/2018    Years since quitting: 2.9   Smokeless tobacco: Never   Tobacco comments:    uses nicotine gums  Substance Use Topics   Alcohol use: No   Drug use: No      Family History  Problem Relation Age of Onset   Breast cancer Mother 39   Breast cancer Maternal Aunt        mat aunt and great aunt   Depression Sister      Allergies  Allergen Reactions   Effexor Xr [Venlafaxine Hcl]     suicidality   Penicillins Swelling   Amoxicillin-Pot Clavulanate    Keflex [Cephalexin] Hives    REVIEW OF SYSTEMS (Negative unless checked)   Constitutional: [] Weight loss  [] Fever  [] Chills Cardiac: [] Chest pain   [] Chest pressure   [x] Palpitations   [] Shortness of breath when laying flat   [] Shortness of breath at rest   [x] Shortness of breath with exertion. Vascular:  [] Pain in legs with walking   [] Pain in legs at rest   [] Pain in legs when laying flat   [x] Claudication   [] Pain in feet when walking  [] Pain in feet at rest  [] Pain in feet when laying flat   [] History of DVT   [] Phlebitis   [] Swelling in legs   [] Varicose veins   [] Non-healing ulcers Pulmonary:   [] Uses home oxygen   [] Productive cough   [] Hemoptysis   [] Wheeze  [] COPD   [] Asthma Neurologic:  [x] Dizziness  [] Blackouts   [] Seizures   [] History of stroke   [] History of TIA  [] Aphasia   [] Temporary blindness   [] Dysphagia   [] Weakness or numbness in arms   [] Weakness or numbness in legs Musculoskeletal:  [] Arthritis   [] Joint swelling   [] Joint pain   [] Low back pain Hematologic:  [] Easy bruising  [] Easy bleeding   [] Hypercoagulable state   [] Anemic   Gastrointestinal:  [] Blood in stool   [] Vomiting blood  [x] Gastroesophageal reflux/heartburn   [] Abdominal pain Genitourinary:  [] Chronic kidney disease   [] Difficult urination  [] Frequent urination  [] Burning with urination   [] Hematuria Skin:  [] Rashes   [] Ulcers   [] Wounds Psychological:  [x] History of  anxiety   [x]  History of major depression.  Physical Examination  BP 131/76 (BP Location: Right Arm)   Pulse 89   Resp 15   Wt 107 lb 6.4 oz (48.7 kg)   BMI 19.64 kg/m  Gen:  WD/WN, NAD Head: [place]/AT, No temporalis wasting.  Ear/Nose/Throat: Hearing grossly intact, nares w/o erythema or drainage Eyes: Conjunctiva clear. Sclera non-icteric Neck: Supple.  Trachea midline Pulmonary:  Good air movement, no use of accessory muscles.  Cardiac: RRR, no JVD Vascular:  Vessel Right Left  Radial Palpable Palpable                                   Gastrointestinal: soft, non-tender/non-distended. No increased aortic impulse.  Musculoskeletal: M/S 5/5 throughout.  No deformity or atrophy. No edema. Neurologic: Sensation grossly intact in extremities.  Symmetrical.  Speech is fluent.  Psychiatric: Judgment intact, Mood & affect appropriate for pt's clinical situation. Dermatologic: No rashes or ulcers noted.  No cellulitis or open wounds.      Labs No results found for this or any previous visit (from the past 2160 hour(s)).  Radiology No results found.  Assessment/Plan Pure hypercholesterolemia lipid control important in reducing the progression of atherosclerotic disease. Continue statin therapy     Benign essential hypertension blood pressure control important in reducing the progression of atherosclerotic disease. On appropriate oral medications.     Bilateral carotid artery stenosis Mild at last check, can be checked at her next follow-up visit.  AAA (abdominal aortic aneurysm) without rupture (HCC) Her duplex shows a patent stent graft without endoleak and a significant decrease in the aortic sac size down to 3.2 cm in maximal diameter.  She seems to be doing quite well from her aneurysm.  At this point, we will do 1 more 15-month follow-up, and if that is stable we can go to an annual follow-up thereafter.    Leotis Pain, MD  08/18/2021 11:28 AM    This note  was created with Dragon medical transcription system.  Any errors from dictation are purely unintentional

## 2021-08-18 NOTE — Assessment & Plan Note (Signed)
Her duplex shows a patent stent graft without endoleak and a significant decrease in the aortic sac size down to 3.2 cm in maximal diameter.  She seems to be doing quite well from her aneurysm.  At this point, we will do 1 more 39-month follow-up, and if that is stable we can go to an annual follow-up thereafter.

## 2021-08-23 ENCOUNTER — Other Ambulatory Visit: Payer: Self-pay

## 2021-08-23 ENCOUNTER — Ambulatory Visit
Admission: RE | Admit: 2021-08-23 | Discharge: 2021-08-23 | Disposition: A | Discharge status: Home / Self Care | Payer: Medicare HMO | Source: Ambulatory Visit | Attending: Internal Medicine | Admitting: Internal Medicine

## 2021-08-23 DIAGNOSIS — N644 Mastodynia: Secondary | ICD-10-CM | POA: Diagnosis not present

## 2021-08-23 DIAGNOSIS — R922 Inconclusive mammogram: Secondary | ICD-10-CM | POA: Diagnosis not present

## 2021-09-12 ENCOUNTER — Other Ambulatory Visit: Payer: Self-pay | Admitting: *Deleted

## 2021-09-12 DIAGNOSIS — Z87891 Personal history of nicotine dependence: Secondary | ICD-10-CM

## 2021-09-21 ENCOUNTER — Other Ambulatory Visit: Payer: Self-pay

## 2021-09-21 ENCOUNTER — Ambulatory Visit
Admission: RE | Admit: 2021-09-21 | Discharge: 2021-09-21 | Disposition: A | Discharge status: Home / Self Care | Payer: Medicare HMO | Source: Ambulatory Visit | Attending: Acute Care | Admitting: Acute Care

## 2021-09-21 DIAGNOSIS — Z87891 Personal history of nicotine dependence: Secondary | ICD-10-CM | POA: Diagnosis not present

## 2021-09-25 DIAGNOSIS — E782 Mixed hyperlipidemia: Secondary | ICD-10-CM | POA: Diagnosis not present

## 2021-09-25 DIAGNOSIS — J432 Centrilobular emphysema: Secondary | ICD-10-CM | POA: Diagnosis not present

## 2021-09-25 DIAGNOSIS — J449 Chronic obstructive pulmonary disease, unspecified: Secondary | ICD-10-CM | POA: Diagnosis not present

## 2021-09-25 DIAGNOSIS — I2510 Atherosclerotic heart disease of native coronary artery without angina pectoris: Secondary | ICD-10-CM | POA: Diagnosis not present

## 2021-09-25 DIAGNOSIS — I10 Essential (primary) hypertension: Secondary | ICD-10-CM | POA: Diagnosis not present

## 2021-09-26 ENCOUNTER — Encounter: Payer: Self-pay | Admitting: Student in an Organized Health Care Education/Training Program

## 2021-09-26 ENCOUNTER — Ambulatory Visit
Payer: Medicare HMO | Attending: Student in an Organized Health Care Education/Training Program | Admitting: Student in an Organized Health Care Education/Training Program

## 2021-09-26 ENCOUNTER — Other Ambulatory Visit: Payer: Self-pay

## 2021-09-26 VITALS — BP 131/71 | HR 102 | Temp 97.2°F | Resp 16 | Ht 62.0 in | Wt 108.0 lb

## 2021-09-26 DIAGNOSIS — G8929 Other chronic pain: Secondary | ICD-10-CM | POA: Diagnosis not present

## 2021-09-26 DIAGNOSIS — G894 Chronic pain syndrome: Secondary | ICD-10-CM | POA: Diagnosis not present

## 2021-09-26 DIAGNOSIS — M25552 Pain in left hip: Secondary | ICD-10-CM

## 2021-09-26 DIAGNOSIS — M47816 Spondylosis without myelopathy or radiculopathy, lumbar region: Secondary | ICD-10-CM

## 2021-09-26 DIAGNOSIS — M25551 Pain in right hip: Secondary | ICD-10-CM

## 2021-09-26 DIAGNOSIS — M160 Bilateral primary osteoarthritis of hip: Secondary | ICD-10-CM | POA: Diagnosis not present

## 2021-09-26 DIAGNOSIS — M25511 Pain in right shoulder: Secondary | ICD-10-CM | POA: Diagnosis not present

## 2021-09-26 MED ORDER — BUPRENORPHINE 5 MCG/HR TD PTWK
1.0000 | MEDICATED_PATCH | TRANSDERMAL | 0 refills | Status: AC
Start: 2021-09-26 — End: 2021-10-24

## 2021-09-26 MED ORDER — BUPRENORPHINE 7.5 MCG/HR TD PTWK: 1.0000 | MEDICATED_PATCH | TRANSDERMAL | 0 refills | Status: DC

## 2021-09-26 NOTE — Progress Notes (Signed)
PROVIDER NOTE: Information contained herein reflects review and annotations entered in association with encounter. Interpretation of such information and data should be left to medically-trained personnel. Information provided to patient can be located elsewhere in the medical record under "Patient Instructions". Document created using STT-dictation technology, any transcriptional errors that may result from process are unintentional.    Patient: Melissa Montes  Service Category: E/M  Provider: Gillis Santa, MD  DOB: 1955/12/13  DOS: 09/26/2021  Specialty: Interventional Pain Management  MRN: [id number]  Setting: Ambulatory outpatient  PCP: Perrin Maltese, MD  Type: Established Patient    Referring Provider: Perrin Maltese, MD  Location: Office  Delivery: Face-to-face     HPI  [name], a 65 y.o. year old female, is here today because of her Chronic pain syndrome [G89.4]. [name]'s primary complain today is chronic low back pain Last encounter: My last encounter with her was on 05/09/21 Pertinent problems: [name] has S/P coronary artery stent placement; Peripheral vascular disease ([place]); GAD (generalized anxiety disorder); S/P coronary artery bypass graft x 2; Osteoporosis, post-menopausal; Chronic pain syndrome; Lumbar facet arthropathy; Bilateral hip pain; Primary osteoarthritis of both hips; Chronic left shoulder pain; Right rotator cuff tear arthropathy; Left rotator cuff tear arthropathy; and Lumbar degenerative disc disease on their pertinent problem list. Pain Assessment: Severity of Chronic pain is reported as a 8 /10. Location: Back Lower/denies. Onset: More than a month ago. Quality: Aching, Dull, Sharp. Timing: Constant. Modifying factor(s): Tramadol. Vitals:  height is '5\' 2"'  (1.575 m) and weight is 108 lb (49 kg). Her temporal temperature is 97.2 F (36.2 C) (abnormal). Her blood pressure is 131/71 and her pulse is 102 (abnormal). Her respiration is 16 and oxygen  saturation is 89% (abnormal).   Reason for encounter: medication management.    Patient is having increased low back pain.  States that tramadol is not as helpful anymore.  She states that she is more tearful during the day.  She sees Dr Shea Evans with psychiatry. Discussed transition to buprenorphine for pain management given tramadol has become less effective.  Discussed risks and benefits of buprenorphine.  Discussed proper application of patch. Recommend that she start at 5 mcg/h q. 7 days and increase to 7.5 as below.   Pharmacotherapy Assessment   Analgesic: Transition to Butrans starting at 5 mcg an hour, q. 7 days plan to titrate up to 7.5  Monitoring: [place] PMP: PDMP reviewed during this encounter.       Pharmacotherapy: No side-effects or adverse reactions reported. Compliance: No problems identified. Effectiveness: Clinically acceptable.  Melissa Martins, RN  09/26/2021  1:39 PM  Sign when Signing Visit Nursing Pain Medication Assessment:  Safety precautions to be maintained throughout the outpatient stay will include: orient to surroundings, keep bed in low position, maintain call bell within reach at all times, provide assistance with transfer out of bed and ambulation.  Medication Inspection Compliance: Pill count conducted under aseptic conditions, in front of the patient. Neither the pills nor the bottle was removed from the patient's sight at any time. Once count was completed pills were immediately returned to the patient in their original bottle.  Medication: Tramadol (Ultram) Pill/Patch Count:  29 of 90 pills remain Pill/Patch Appearance: Markings consistent with prescribed medication Bottle Appearance: Standard pharmacy container. Clearly labeled. Filled Date: 08 / 20 / 2022 Last Medication intake:  Yesterday     UDS:  Summary  Date Value Ref Range Status  02/07/2021 Note  Final  Comment:     ==================================================================== [employer] Select 13 (MW) ==================================================================== Test                             Result       Flag       Units  Drug Present and Declared for Prescription Verification   Tramadol                       >2907        EXPECTED   ng/mg creat   O-Desmethyltramadol            >2907        EXPECTED   ng/mg creat   N-Desmethyltramadol            >2907        EXPECTED   ng/mg creat    Source of tramadol is a prescription medication. O-desmethyltramadol    and N-desmethyltramadol are expected metabolites of tramadol.  ==================================================================== Test                      Result    Flag   Units      Ref Range   Creatinine              172              mg/dL      >=20 ==================================================================== Declared Medications:  The flagging and interpretation on this report are based on the  following declared medications.  Unexpected results may arise from  inaccuracies in the declared medications.   **Note: The testing scope of this panel includes these medications:   Tramadol   **Note: The testing scope of this panel does not include the  following reported medications:   Aspirin  Atorvastatin  Clopidogrel  Duloxetine  Ezetimibe  Fluticasone  Hydroxyzine  Meclizine  Metoprolol  Multivitamin  Nitroglycerin  Probiotic  Promethazine  Trazodone  Vitamin D3 ==================================================================== For clinical consultation, please call [phone]. ====================================================================      ROS  Constitutional: Denies any fever or chills Gastrointestinal: No reported hemesis, hematochezia, vomiting, or acute GI distress Musculoskeletal:  Low back, bilateral SI joint pain Neurological: No reported episodes of acute onset apraxia,  aphasia, dysarthria, agnosia, amnesia, paralysis, loss of coordination, or loss of consciousness  Medication Review  DULoxetine, Probiotic Product, Vitamin D3, aspirin EC, atorvastatin, buprenorphine, clopidogrel, ezetimibe, fluticasone, hydrOXYzine, metoprolol succinate, multivitamin, nitroGLYCERIN, promethazine, traMADol HCl, and traZODone  History Review  Allergy: Ms. Hustead is allergic to effexor xr [venlafaxine hcl], penicillins, amoxicillin-pot clavulanate, and keflex [cephalexin]. Drug: Ms. Bare  reports no history of drug use. Alcohol:  reports no history of alcohol use. Tobacco:  reports that she quit smoking about 3 years ago. Her smoking use included cigarettes. She has a 41.00 pack-year smoking history. She has never used smokeless tobacco. Social: Ms. Blanchett  reports that she quit smoking about 3 years ago. Her smoking use included cigarettes. She has a 41.00 pack-year smoking history. She has never used smokeless tobacco. She reports that she does not drink alcohol and does not use drugs. Medical:  has a past medical history of ADD (attention deficit disorder), Anxiety, Aortic aneurysm ([place]), Atherosclerosis of abdominal aorta ([place]), Bipolar disorder ([place]), Bulging of lumbar intervertebral disc, CAD (coronary artery disease), Cancer ([place]), COPD (chronic obstructive pulmonary disease) ([place]), Depression, Diverticulitis, Dyspnea, History of kidney stones, Hyperlipidemia, Hypertension, Insomnia, Lumbar spondylosis, MI (myocardial  infarction) ([place]), Migraine, Osteoporosis, PONV (postoperative nausea and vomiting), PVD (peripheral vascular disease) ([place]), 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 Surgical: Ms. Hoganson  has a past surgical history that includes Coronary angioplasty with stent; Abdominal hysterectomy; CAROTID ANGIOGRAPHY (N/A, 01/13/2018); Left Heart Cath (Right,  07/07/2018); Colonoscopy; iv INJECTIONS TO BACK; and ENDOVASCULAR REPAIR/STENT GRAFT (Bilateral, 12/14/2020). Family: family history includes Breast cancer in her maternal aunt; Breast cancer (age of onset: 15) in her mother; Depression in her sister.  Laboratory Chemistry Profile   Renal Lab Results  Component Value Date   BUN 18 04/28/2021   CREATININE 0.77 04/28/2021   GFRAA >60 07/17/2020   GFRNONAA >60 04/28/2021     Hepatic Lab Results  Component Value Date   AST 22 04/28/2021   ALT 16 04/28/2021   ALBUMIN 3.8 04/28/2021   ALKPHOS 119 04/28/2021     Electrolytes Lab Results  Component Value Date   NA 138 04/28/2021   K 4.0 04/28/2021   CL 100 04/28/2021   CALCIUM 9.2 04/28/2021     Bone No results found for: VD25OH, VD125OH2TOT, SA6301SW1, UX3235TD3, 25OHVITD1, 25OHVITD2, 25OHVITD3, TESTOFREE, TESTOSTERONE   Inflammation (CRP: Acute Phase) (ESR: Chronic Phase) No results found for: CRP, ESRSEDRATE, LATICACIDVEN     Note: Above Lab results reviewed.  Recent Imaging Review  CT CHEST LUNG CA SCREEN LOW DOSE W/O CM CLINICAL DATA:  Lung cancer screening. Forty-one pack-year history. Former asymptomatic smoker.  EXAM: CT CHEST WITHOUT CONTRAST LOW-DOSE FOR LUNG CANCER SCREENING  TECHNIQUE: Multidetector CT imaging of the chest was performed following the standard protocol without IV contrast.  COMPARISON:  04/07/2020  FINDINGS: Cardiovascular: Previous median sternotomy and CABG procedure. Heart size normal. Aortic atherosclerosis. Coronary artery calcifications. No pericardial effusion.  Mediastinum/Nodes: No enlarged mediastinal, hilar, or axillary lymph nodes. Thyroid gland, trachea, and esophagus demonstrate no significant findings.  Lungs/Pleura: No pleural effusion, airspace consolidation, or pneumothorax. Severe changes of paraseptal and centrilobular emphysema identified. The previously noted lung nodules are stable in the interval. There is a new  solid subpleural nodule within the periphery of the right lower lobe which has a mean derived diameter of 5 mm.  Upper Abdomen: No acute abnormality. Right lobe of liver cyst measures 1 cm, image 58/2. Aortic stent graft identified. Stone within upper pole of the left kidney measures 3 mm.  Musculoskeletal: No chest wall mass or suspicious bone lesions identified.  IMPRESSION: 1. Lung-RADS 3, probably benign findings. Short-term follow-up in 6 months is recommended with repeat low-dose chest CT without contrast (please use the following order, "CT CHEST LCS NODULE FOLLOW-UP W/O CM"). 2. Aortic Atherosclerosis (ICD10-I70.0) and Emphysema (ICD10-J43.9). 3. Coronary artery calcifications.  Electronically Signed   By: Kerby Moors M.D.   On: 09/22/2021 16:25 Note: Reviewed        Physical Exam  General appearance: Well nourished, well developed, and well hydrated. In no apparent acute distress Mental status: Alert, oriented x 3 (person, place, & time)       Respiratory: No evidence of acute respiratory distress Eyes: PERLA Vitals: BP 131/71 (BP Location: Right Arm, Patient Position: Sitting, Cuff Size: Normal)   Pulse (!) 102   Temp (!) 97.2 F (36.2 C) (Temporal)   Resp 16   Ht '5\' 2"'  (1.575 m)   Wt 108 lb (49 kg)   SpO2 (!) 89%   BMI 19.75 kg/m  BMI: Estimated body mass index is 19.75 kg/m as calculated  from the following:   Height as of this encounter: '5\' 2"'  (1.575 m).   Weight as of this encounter: 108 lb (49 kg). Ideal: Ideal body weight: 50.1 kg (110 lb 7.2 oz)   Low back pain, worse with lumbar extension. 5 out of 5 strength bilateral lower extremity: Plantar flexion, dorsiflexion, knee flexion, knee extension.  Assessment   Status Diagnosis  Having a Flare-up Having a Flare-up Having a Flare-up 1. Chronic pain syndrome   2. Lumbar facet arthropathy   3. Bilateral hip pain   4. Chronic right shoulder pain   5. Primary osteoarthritis of both hips   6.  Lumbar spondylosis        Plan of Care   Ms. Eulonda Andalon Due has a current medication list which includes the following long-term medication(s): atorvastatin, duloxetine, ezetimibe, fluticasone, metoprolol succinate, nitroglycerin, promethazine, trazodone, and tramadol hcl.  Pharmacotherapy (Medications Ordered): Meds ordered this encounter  Medications   buprenorphine (BUTRANS) 5 MCG/HR PTWK    Sig: Place 1 patch onto the skin once a week for 28 days.    Dispense:  4 patch    Refill:  0    Chronic Pain: STOP Act (Not applicable) Fill 1 day early if closed on refill date. Avoid benzodiazepines within 8 hours of opioids   buprenorphine (BUTRANS) 7.5 MCG/HR    Sig: Place 1 patch onto the skin once a week for 28 days.    Dispense:  4 patch    Refill:  0    Chronic Pain: STOP Act (Not applicable) Fill 1 day early if closed on refill date. Avoid benzodiazepines within 8 hours of opioids    Follow-up plan:   Return in about 8 weeks (around 11/21/2021) for Medication Management, in person.     Status post bilateral L3, L4, L5 facet medial branch nerve block #2 3/15/202, 90% pain relief for 10-12 hrs, proceed with RFA.  Left L3, L4, L5 RFA 03/14/2020; Right L3,4,5 RFA 04/11/20.  Repeat as needed, consider sprint peripheral nerve stimulation of lumbar medial branch         Recent Visits No visits were found meeting these conditions. Showing recent visits within past 90 days and meeting all other requirements Today's Visits Date Type Provider Dept  09/26/21 Office Visit Gillis Santa, MD [facility]  Showing today's visits and meeting all other requirements Future Appointments Date Type Provider Dept  11/21/21 Appointment Gillis Santa, MD [facility]  Showing future appointments within next 90 days and meeting all other requirements I discussed the assessment and treatment plan with the patient. The patient was provided an opportunity to ask questions and all were  answered. The patient agreed with the plan and demonstrated an understanding of the instructions.  Patient advised to call back or seek an in-person evaluation if the symptoms or condition worsens.  Duration of encounter: 30 minutes.  Note by: Gillis Santa, MD Date: 09/26/2021; Time: 1:55 PM

## 2021-09-26 NOTE — Progress Notes (Signed)
Nursing Pain Medication Assessment:  Safety precautions to be maintained throughout the outpatient stay will include: orient to surroundings, keep bed in low position, maintain call bell within reach at all times, provide assistance with transfer out of bed and ambulation.  Medication Inspection Compliance: Pill count conducted under aseptic conditions, in front of the patient. Neither the pills nor the bottle was removed from the patient's sight at any time. Once count was completed pills were immediately returned to the patient in their original bottle.  Medication: Tramadol (Ultram) Pill/Patch Count:  29 of 90 pills remain Pill/Patch Appearance: Markings consistent with prescribed medication Bottle Appearance: Standard pharmacy container. Clearly labeled. Filled Date: 08 / 20 / 2022 Last Medication intake:  Yesterday

## 2021-09-27 ENCOUNTER — Other Ambulatory Visit: Payer: Self-pay | Admitting: Acute Care

## 2021-09-27 DIAGNOSIS — Z87891 Personal history of nicotine dependence: Secondary | ICD-10-CM

## 2021-09-28 ENCOUNTER — Telehealth: Payer: Self-pay

## 2021-09-28 NOTE — Telephone Encounter (Signed)
Pharmacy called inquiring about dose of the Butrans patch.  I have called twice and waited for 30 minutes on hold and each time it says there are more than 3 callers in the que.Will try again when time allows.

## 2021-10-02 ENCOUNTER — Telehealth: Payer: Self-pay

## 2021-10-02 NOTE — Telephone Encounter (Signed)
Patient notified by Terrilyn Saver, RN that she must allow more time in order for the [facility] patch to be effective, and that she may take up to 2 Tramadol per day, per Dr. Holley Raring.

## 2021-10-02 NOTE — Telephone Encounter (Signed)
Pt states that the pain patch is not working she is in a lot of pain and she can not take the tramadol since she started the patch asking for ways to manage the pain

## 2021-10-05 ENCOUNTER — Telehealth: Payer: Self-pay | Admitting: Acute Care

## 2021-10-05 NOTE — Telephone Encounter (Signed)
CT results faxed to PCP. Order was placed for 6 mth /u low dose nodule f/u CT.

## 2021-10-05 NOTE — Telephone Encounter (Signed)
I have called the patient with the results of her low-dose CT.  I explained that her scan had been read as a lung RADS 3, Lung  RADS 3, nodules that are probably benign findings, short term follow up suggested: includes nodules with a low likelihood of becoming a clinically active cancer. Radiology recommends a 6 month repeat LDCT follow up.  I explained that there was notation of a new subpleural nodule in the right lower lobe with the main derived diameter 5 mm. I also explained to her that previously noted lung nodules are stable in the interval.  We also discussed that there was notation of a right lobe of the liver cyst. Patient does have coronary artery disease, has had a stent graft placed, and is followed by cardiology There was notation that the patient had severe changes of paraseptal and centrilobular emphysema.  She is very concerned about this.  She does not recall ever having had pulmonary function testing, but she did see Dr. Raul Del at [facility] [place] in 2016.  I have encouraged the patient to call Dr. Gust Brooms office and request a follow-up appointment. She does currently have a Symbicort inhaler that she states she does not use on a regular basis.  When asked her if she has shortness of breath every day she states with exertion that she does. We discussed that it would be a good idea for her to start her Symbicort 2 puffs twice daily rinsing mouth after use until she can follow-up with Dr. Raul Del at the [place] [facility].  Patient does understand she will get a call closer to the time her scan is due which is May 2023 to set up date and time. Langley Gauss please place order for 28-month follow-up for May 2023, and fax results to PCP.  Thanks so much

## 2021-10-23 DIAGNOSIS — R0602 Shortness of breath: Secondary | ICD-10-CM | POA: Diagnosis not present

## 2021-10-23 DIAGNOSIS — J449 Chronic obstructive pulmonary disease, unspecified: Secondary | ICD-10-CM | POA: Diagnosis not present

## 2021-10-23 DIAGNOSIS — I2510 Atherosclerotic heart disease of native coronary artery without angina pectoris: Secondary | ICD-10-CM | POA: Diagnosis not present

## 2021-10-23 DIAGNOSIS — E782 Mixed hyperlipidemia: Secondary | ICD-10-CM | POA: Diagnosis not present

## 2021-10-23 DIAGNOSIS — I25810 Atherosclerosis of coronary artery bypass graft(s) without angina pectoris: Secondary | ICD-10-CM | POA: Diagnosis not present

## 2021-10-23 DIAGNOSIS — I340 Nonrheumatic mitral (valve) insufficiency: Secondary | ICD-10-CM | POA: Diagnosis not present

## 2021-10-23 DIAGNOSIS — I10 Essential (primary) hypertension: Secondary | ICD-10-CM | POA: Diagnosis not present

## 2021-10-24 ENCOUNTER — Other Ambulatory Visit: Payer: Self-pay | Admitting: Student in an Organized Health Care Education/Training Program

## 2021-10-24 DIAGNOSIS — G894 Chronic pain syndrome: Secondary | ICD-10-CM

## 2021-10-24 DIAGNOSIS — M47816 Spondylosis without myelopathy or radiculopathy, lumbar region: Secondary | ICD-10-CM

## 2021-10-31 ENCOUNTER — Telehealth: Payer: Self-pay | Admitting: Student in an Organized Health Care Education/Training Program

## 2021-10-31 DIAGNOSIS — M47816 Spondylosis without myelopathy or radiculopathy, lumbar region: Secondary | ICD-10-CM

## 2021-10-31 DIAGNOSIS — G894 Chronic pain syndrome: Secondary | ICD-10-CM

## 2021-10-31 MED ORDER — TRAMADOL HCL 100 MG PO TABS: 1.5000 | ORAL_TABLET | Freq: Two times a day (BID) | ORAL | 0 refills | Status: DC | PRN

## 2021-10-31 NOTE — Telephone Encounter (Signed)
Patient lvmail stating she needs Tramadol script sent to pharmacy. She has been waiting a week, pharmacy sent request. States Dr. Holley Raring told her to call if she needed refill on tramadol. She has Med Mgmt appt on 11-21-20 please let patient know when she can pick up

## 2021-10-31 NOTE — Telephone Encounter (Signed)
Patient notified

## 2021-11-21 ENCOUNTER — Encounter: Payer: Self-pay | Admitting: Student in an Organized Health Care Education/Training Program

## 2021-11-21 ENCOUNTER — Ambulatory Visit
Payer: Medicare HMO | Attending: Student in an Organized Health Care Education/Training Program | Admitting: Student in an Organized Health Care Education/Training Program

## 2021-11-21 ENCOUNTER — Other Ambulatory Visit: Payer: Self-pay

## 2021-11-21 VITALS — BP 93/64 | HR 93 | Temp 79.1°F | Ht 62.0 in | Wt 108.0 lb

## 2021-11-21 DIAGNOSIS — M25551 Pain in right hip: Secondary | ICD-10-CM | POA: Diagnosis not present

## 2021-11-21 DIAGNOSIS — M160 Bilateral primary osteoarthritis of hip: Secondary | ICD-10-CM | POA: Insufficient documentation

## 2021-11-21 DIAGNOSIS — M25552 Pain in left hip: Secondary | ICD-10-CM | POA: Diagnosis not present

## 2021-11-21 DIAGNOSIS — G894 Chronic pain syndrome: Secondary | ICD-10-CM | POA: Diagnosis not present

## 2021-11-21 DIAGNOSIS — M25511 Pain in right shoulder: Secondary | ICD-10-CM | POA: Diagnosis not present

## 2021-11-21 DIAGNOSIS — G8929 Other chronic pain: Secondary | ICD-10-CM | POA: Insufficient documentation

## 2021-11-21 DIAGNOSIS — M47816 Spondylosis without myelopathy or radiculopathy, lumbar region: Secondary | ICD-10-CM | POA: Diagnosis not present

## 2021-11-21 MED ORDER — BUPRENORPHINE 10 MCG/HR TD PTWK: 1.0000 | MEDICATED_PATCH | TRANSDERMAL | 1 refills | Status: DC

## 2021-11-21 MED ORDER — TRAMADOL HCL 100 MG PO TABS: 1.5000 | ORAL_TABLET | Freq: Two times a day (BID) | ORAL | 1 refills | Status: DC | PRN

## 2021-11-21 NOTE — Progress Notes (Signed)
Nursing Pain Medication Assessment:  Safety precautions to be maintained throughout the outpatient stay will include: orient to surroundings, keep bed in low position, maintain call bell within reach at all times, provide assistance with transfer out of bed and ambulation.  Medication Inspection Compliance: Pill count conducted under aseptic conditions, in front of the patient. Neither the pills nor the bottle was removed from the patient's sight at any time. Once count was completed pills were immediately returned to the patient in their original bottle.  Medication: Tramadol (Ultram) Pill/Patch Count:  61 of 90 pills remain Pill/Patch Appearance: Markings consistent with prescribed medication Bottle Appearance: Standard pharmacy container. Clearly labeled. Filled Date: 39 / 13 / 2022 Last Medication intake:  YesterdaySafety precautions to be maintained throughout the outpatient stay will include: orient to surroundings, keep bed in low position, maintain call bell within reach at all times, provide assistance with transfer out of bed and ambulation.

## 2021-11-21 NOTE — Progress Notes (Signed)
PROVIDER NOTE: Information contained herein reflects review and annotations entered in association with encounter. Interpretation of such information and data should be left to medically-trained personnel. Information provided to patient can be located elsewhere in the medical record under "Patient Instructions". Document created using STT-dictation technology, any transcriptional errors that may result from process are unintentional.    Patient: Melissa Montes  Service Category: E/M  Provider: Gillis Santa, MD  DOB: [birth year]  DOS: 11/21/2021  Specialty: Interventional Pain Management  MRN: [id number]  Setting: Ambulatory outpatient  PCP: Perrin Maltese, MD  Type: Established Patient    Referring Provider: Perrin Maltese, MD  Location: Office  Delivery: Face-to-face     HPI  Ms. Ovie Eastep, a 66 y.o. year old female, is here today because of her Chronic pain syndrome [G89.4]. Ms. Spindler's primary complain today is chronic low back pain Last encounter: My last encounter with her was on 09/26/21 Pertinent problems: Ms. Eveland has S/P coronary artery stent placement; Peripheral vascular disease ([place]); GAD (generalized anxiety disorder); S/P coronary artery bypass graft x 2; Osteoporosis, post-menopausal; Chronic pain syndrome; Lumbar facet arthropathy; Bilateral hip pain; Primary osteoarthritis of both hips; Chronic left shoulder pain; Right rotator cuff tear arthropathy; Left rotator cuff tear arthropathy; and Lumbar degenerative disc disease on their pertinent problem list. Pain Assessment: Severity of Chronic pain is reported as a 6 /10. Location: Back  /Denies. Onset: More than a month ago. Quality: Aching, Sharp, Burning. Timing: Constant. Modifying factor(s): Tramadol. Vitals:  height is $RemoveB'5\' 2"'zsZEvCdg$  (1.575 m) and weight is 108 lb (49 kg). Her temperature is 79.1 F (26.2 C) (abnormal). Her blood pressure is 93/64 and her pulse is 93. Her oxygen saturation is 97%.   Reason for encounter:  medication management.    Patient is having increased low back pain.  She does find benefit with tramadol 150 mg twice daily.  She was also started on Butrans patch and is finding some albeit very minor pain relief in regards to her baseline chronic pain with it.  We will try dose escalation to 10 mcg an hour and see if that optimizes pain management for her.  Continue tramadol as prescribed.  Patient counseled on risk of serotonin syndrome and instructed to monitor for any concerning symptoms such as diaphoresis, headache, nausea, retention, tachycardia, flushing.   Pharmacotherapy Assessment   Analgesic: Increase Butrans patch to 10 mcg an hour.  Continue tramadol 150 mg every 12 hrs for breakthrough pain Monitoring: [place] PMP: PDMP reviewed during this encounter.       Pharmacotherapy: No side-effects or adverse reactions reported. Compliance: No problems identified. Effectiveness: Clinically acceptable.  Chauncey Fischer, RN  11/21/2021  1:43 PM  Sign when Signing Visit Nursing Pain Medication Assessment:  Safety precautions to be maintained throughout the outpatient stay will include: orient to surroundings, keep bed in low position, maintain call bell within reach at all times, provide assistance with transfer out of bed and ambulation.  Medication Inspection Compliance: Pill count conducted under aseptic conditions, in front of the patient. Neither the pills nor the bottle was removed from the patient's sight at any time. Once count was completed pills were immediately returned to the patient in their original bottle.  Medication: Tramadol (Ultram) Pill/Patch Count:  61 of 90 pills remain Pill/Patch Appearance: Markings consistent with prescribed medication Bottle Appearance: Standard pharmacy container. Clearly labeled. Filled Date: 39 / 13 / 2022 Last Medication intake:  YesterdaySafety precautions to be maintained throughout the outpatient stay  will include: orient to surroundings, keep  bed in low position, maintain call bell within reach at all times, provide assistance with transfer out of bed and ambulation.      UDS:  Summary  Date Value Ref Range Status  02/07/2021 Note  Final    Comment:    ==================================================================== [employer] [facility] (MW) ==================================================================== Test                             Result       Flag       Units  Drug Present and Declared for Prescription Verification   Tramadol                       >2907        EXPECTED   ng/mg creat   O-Desmethyltramadol            >2907        EXPECTED   ng/mg creat   N-Desmethyltramadol            >2907        EXPECTED   ng/mg creat    Source of tramadol is a prescription medication. O-desmethyltramadol    and N-desmethyltramadol are expected metabolites of tramadol.  ==================================================================== Test                      Result    Flag   Units      Ref Range   Creatinine              172              mg/dL      >=20 ==================================================================== Declared Medications:  The flagging and interpretation on this report are based on the  following declared medications.  Unexpected results may arise from  inaccuracies in the declared medications.   **Note: The testing scope of this panel includes these medications:   Tramadol   **Note: The testing scope of this panel does not include the  following reported medications:   Aspirin  Atorvastatin  Clopidogrel  Duloxetine  Ezetimibe  Fluticasone  Hydroxyzine  Meclizine  Metoprolol  Multivitamin  Nitroglycerin  Probiotic  Promethazine  Trazodone  Vitamin D3 ==================================================================== For clinical consultation, please call [phone]. ====================================================================      ROS  Constitutional: Denies  any fever or chills Gastrointestinal: No reported hemesis, hematochezia, vomiting, or acute GI distress Musculoskeletal:  Low back, bilateral SI joint pain Neurological: No reported episodes of acute onset apraxia, aphasia, dysarthria, agnosia, amnesia, paralysis, loss of coordination, or loss of consciousness  Medication Review  DULoxetine, Probiotic Product, Vitamin D3, aspirin EC, atorvastatin, buprenorphine, clopidogrel, ezetimibe, fluticasone, hydrOXYzine, metoprolol succinate, multivitamin, nitroGLYCERIN, promethazine, traMADol HCl, and traZODone  History Review  Allergy: Ms. Crotwell is allergic to effexor xr [venlafaxine hcl], penicillins, amoxicillin-pot clavulanate, and keflex [cephalexin]. Drug: Ms. Marsan  reports no history of drug use. Alcohol:  reports no history of alcohol use. Tobacco:  reports that she quit smoking about 3 years ago. Her smoking use included cigarettes. She has a 41.00 pack-year smoking history. She has never used smokeless tobacco. Social: Ms. Riesen  reports that she quit smoking about 3 years ago. Her smoking use included cigarettes. She has a 41.00 pack-year smoking history. She has never used smokeless tobacco. She reports that she does not drink alcohol and does not use drugs. Medical:  has a past medical  history of ADD (attention deficit disorder), Anxiety, Aortic aneurysm ([place]), Atherosclerosis of abdominal aorta ([place]), Bipolar disorder ([place]), Bulging of lumbar intervertebral disc, CAD (coronary artery disease), Cancer ([place]), COPD (chronic obstructive pulmonary disease) ([place]), Depression, Diverticulitis, Dyspnea, History of kidney stones, Hyperlipidemia, Hypertension, Insomnia, Lumbar spondylosis, MI (myocardial infarction) ([place]), Migraine, Osteoporosis, PONV (postoperative nausea and vomiting), PVD (peripheral vascular disease) ([place]), 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 Surgical: Ms. Dutan  has a past surgical history that includes Coronary angioplasty with stent; Abdominal hysterectomy; CAROTID ANGIOGRAPHY (N/A, 01/13/2018); Left Heart Cath (Right, 07/07/2018); Colonoscopy; iv INJECTIONS TO BACK; and ENDOVASCULAR REPAIR/STENT GRAFT (Bilateral, 12/14/2020). Family: family history includes Breast cancer in her maternal aunt; Breast cancer (age of onset: 36) in her mother; Depression in her sister.  Laboratory Chemistry Profile   Renal Lab Results  Component Value Date   BUN 18 04/28/2021   CREATININE 0.77 04/28/2021   GFRAA >60 07/17/2020   GFRNONAA >60 04/28/2021     Hepatic Lab Results  Component Value Date   AST 22 04/28/2021   ALT 16 04/28/2021   ALBUMIN 3.8 04/28/2021   ALKPHOS 119 04/28/2021     Electrolytes Lab Results  Component Value Date   NA 138 04/28/2021   K 4.0 04/28/2021   CL 100 04/28/2021   CALCIUM 9.2 04/28/2021     Bone No results found for: VD25OH, VD125OH2TOT, YY5035WS5, KC1275TZ0, 25OHVITD1, 25OHVITD2, 25OHVITD3, TESTOFREE, TESTOSTERONE   Inflammation (CRP: Acute Phase) (ESR: Chronic Phase) No results found for: CRP, ESRSEDRATE, LATICACIDVEN     Note: Above Lab results reviewed.  Recent Imaging Review  CT CHEST LUNG CA SCREEN LOW DOSE W/O CM CLINICAL DATA:  Lung cancer screening. Forty-one pack-year history. Former asymptomatic smoker.  EXAM: CT CHEST WITHOUT CONTRAST LOW-DOSE FOR LUNG CANCER SCREENING  TECHNIQUE: Multidetector CT imaging of the chest was performed following the standard protocol without IV contrast.  COMPARISON:  04/07/2020  FINDINGS: Cardiovascular: Previous median sternotomy and CABG procedure. Heart size normal. Aortic atherosclerosis. Coronary artery calcifications. No pericardial effusion.  Mediastinum/Nodes: No enlarged mediastinal, hilar, or axillary lymph nodes. Thyroid gland, trachea, and esophagus demonstrate no significant  findings.  Lungs/Pleura: No pleural effusion, airspace consolidation, or pneumothorax. Severe changes of paraseptal and centrilobular emphysema identified. The previously noted lung nodules are stable in the interval. There is a new solid subpleural nodule within the periphery of the right lower lobe which has a mean derived diameter of 5 mm.  Upper Abdomen: No acute abnormality. Right lobe of liver cyst measures 1 cm, image 58/2. Aortic stent graft identified. Stone within upper pole of the left kidney measures 3 mm.  Musculoskeletal: No chest wall mass or suspicious bone lesions identified.  IMPRESSION: 1. Lung-RADS 3, probably benign findings. Short-term follow-up in 6 months is recommended with repeat low-dose chest CT without contrast (please use the following order, "CT CHEST LCS NODULE FOLLOW-UP W/O CM"). 2. Aortic Atherosclerosis (ICD10-I70.0) and Emphysema (ICD10-J43.9). 3. Coronary artery calcifications.  Electronically Signed   By: Kerby Moors M.D.   On: 09/22/2021 16:25  Note: Reviewed        Physical Exam  General appearance: Well nourished, well developed, and well hydrated. In no apparent acute distress Mental status: Alert, oriented x 3 (person, place, & time)       Respiratory: No evidence of acute respiratory distress Eyes: PERLA Vitals: BP 93/64    Pulse 93    Temp (!) 79.1 F (26.2  C)    Ht 5' 2" (1.575 m)    Wt 108 lb (49 kg)    SpO2 97%    BMI 19.75 kg/m  BMI: Estimated body mass index is 19.75 kg/m as calculated from the following:   Height as of this encounter: 5' 2" (1.575 m).   Weight as of this encounter: 108 lb (49 kg). Ideal: Ideal body weight: 50.1 kg (110 lb 7.2 oz)   Low back pain, worse with lumbar extension. 5 out of 5 strength bilateral lower extremity: Plantar flexion, dorsiflexion, knee flexion, knee extension.  Assessment   Status Diagnosis  Persistent Persistent Persistent 1. Chronic pain syndrome   2. Lumbar facet  arthropathy   3. Bilateral hip pain   4. Chronic right shoulder pain   5. Primary osteoarthritis of both hips   6. Lumbar spondylosis        Plan of Care   Ms. Glenetta Kiger Welter has a current medication list which includes the following long-term medication(s): atorvastatin, duloxetine, ezetimibe, fluticasone, metoprolol succinate, nitroglycerin, promethazine, trazodone, and tramadol hcl.  Pharmacotherapy (Medications Ordered): Meds ordered this encounter  Medications   buprenorphine (BUTRANS) 10 MCG/HR PTWK    Sig: Place 1 patch onto the skin once a week.    Dispense:  4 patch    Refill:  1    Chronic Pain: STOP Act (Not applicable) Fill 1 day early if closed on refill date. Avoid benzodiazepines within 8 hours of opioids   traMADol HCl 100 MG TABS    Sig: Take 1.5 tablets by mouth 2 (two) times daily as needed.    Dispense:  90 tablet    Refill:  1    For chronic pain syndrome  Continue Cymbalta as prescribed.  Counseled on risk of serotonin syndrome. Avoid NSAIDs as patient is on Plavix. Has tried and failed gabapentin and Lyrica in the past.  Follow-up plan:   Return in about 8 weeks (around 01/16/2022) for Medication Management, in person.     Status post bilateral L3, L4, L5 facet medial branch nerve block #2 3/15/202, 90% pain relief for 10-12 hrs, proceed with RFA.  Left L3, L4, L5 RFA 03/14/2020; Right L3,4,5 RFA 04/11/20.  Repeat as needed, consider sprint peripheral nerve stimulation of lumbar medial branch         Recent Visits Date Type Provider Dept  09/26/21 Office Visit Gillis Santa, MD [facility]  Showing recent visits within past 90 days and meeting all other requirements Today's Visits Date Type Provider Dept  11/21/21 Office Visit Gillis Santa, MD [facility]  Showing today's visits and meeting all other requirements Future Appointments Date Type Provider Dept  01/09/22 Appointment Gillis Santa, MD [facility]  Showing  future appointments within next 90 days and meeting all other requirements  I discussed the assessment and treatment plan with the patient. The patient was provided an opportunity to ask questions and all were answered. The patient agreed with the plan and demonstrated an understanding of the instructions.  Patient advised to call back or seek an in-person evaluation if the symptoms or condition worsens.  Duration of encounter: 30 minutes.  Note by: Gillis Santa, MD Date: 11/21/2021; Time: 2:17 PM

## 2021-12-10 ENCOUNTER — Other Ambulatory Visit: Payer: Self-pay | Admitting: Psychiatry

## 2021-12-10 DIAGNOSIS — F411 Generalized anxiety disorder: Secondary | ICD-10-CM

## 2021-12-19 ENCOUNTER — Other Ambulatory Visit: Payer: Self-pay | Admitting: Student in an Organized Health Care Education/Training Program

## 2021-12-19 DIAGNOSIS — G894 Chronic pain syndrome: Secondary | ICD-10-CM

## 2021-12-19 DIAGNOSIS — M47816 Spondylosis without myelopathy or radiculopathy, lumbar region: Secondary | ICD-10-CM

## 2021-12-21 ENCOUNTER — Other Ambulatory Visit: Payer: Self-pay | Admitting: Student in an Organized Health Care Education/Training Program

## 2021-12-21 DIAGNOSIS — G894 Chronic pain syndrome: Secondary | ICD-10-CM

## 2021-12-21 DIAGNOSIS — M47816 Spondylosis without myelopathy or radiculopathy, lumbar region: Secondary | ICD-10-CM

## 2022-01-09 ENCOUNTER — Encounter: Payer: Self-pay | Admitting: Student in an Organized Health Care Education/Training Program

## 2022-01-09 ENCOUNTER — Ambulatory Visit
Payer: Medicare HMO | Attending: Student in an Organized Health Care Education/Training Program | Admitting: Student in an Organized Health Care Education/Training Program

## 2022-01-09 ENCOUNTER — Other Ambulatory Visit: Payer: Self-pay

## 2022-01-09 VITALS — BP 111/73 | HR 110 | Temp 97.4°F | Resp 16 | Ht 62.0 in | Wt 108.0 lb

## 2022-01-09 DIAGNOSIS — G8929 Other chronic pain: Secondary | ICD-10-CM | POA: Insufficient documentation

## 2022-01-09 DIAGNOSIS — M25551 Pain in right hip: Secondary | ICD-10-CM | POA: Diagnosis not present

## 2022-01-09 DIAGNOSIS — M160 Bilateral primary osteoarthritis of hip: Secondary | ICD-10-CM | POA: Diagnosis not present

## 2022-01-09 DIAGNOSIS — M75101 Unspecified rotator cuff tear or rupture of right shoulder, not specified as traumatic: Secondary | ICD-10-CM | POA: Insufficient documentation

## 2022-01-09 DIAGNOSIS — M25512 Pain in left shoulder: Secondary | ICD-10-CM | POA: Insufficient documentation

## 2022-01-09 DIAGNOSIS — M47816 Spondylosis without myelopathy or radiculopathy, lumbar region: Secondary | ICD-10-CM | POA: Insufficient documentation

## 2022-01-09 DIAGNOSIS — G894 Chronic pain syndrome: Secondary | ICD-10-CM | POA: Insufficient documentation

## 2022-01-09 DIAGNOSIS — M25552 Pain in left hip: Secondary | ICD-10-CM | POA: Diagnosis not present

## 2022-01-09 DIAGNOSIS — M25511 Pain in right shoulder: Secondary | ICD-10-CM | POA: Diagnosis not present

## 2022-01-09 DIAGNOSIS — M12811 Other specific arthropathies, not elsewhere classified, right shoulder: Secondary | ICD-10-CM | POA: Diagnosis not present

## 2022-01-09 DIAGNOSIS — M5136 Other intervertebral disc degeneration, lumbar region: Secondary | ICD-10-CM | POA: Diagnosis not present

## 2022-01-09 MED ORDER — BUPRENORPHINE 10 MCG/HR TD PTWK: 1.0000 | MEDICATED_PATCH | TRANSDERMAL | 5 refills | Status: DC

## 2022-01-09 NOTE — Progress Notes (Signed)
Nursing Pain Medication Assessment:  Safety precautions to be maintained throughout the outpatient stay will include: orient to surroundings, keep bed in low position, maintain call bell within reach at all times, provide assistance with transfer out of bed and ambulation.  Medication Inspection Compliance: Pill count conducted under aseptic conditions, in front of the patient. Neither the pills nor the bottle was removed from the patient's sight at any time. Once count was completed pills were immediately returned to the patient in their original bottle.  Medication #1: Tramadol (Ultram) Pill/Patch Count:  70 of 90 pills remain Pill/Patch Appearance: Markings consistent with prescribed medication Bottle Appearance: Standard pharmacy container. Clearly labeled. Filled Date: 28 / 13 / 2022 Last Medication intake:  Yesterday  Medication #2: Buprenorphine (Suboxone) Pill/Patch Count:  did not bring empty box Pill/Patch Appearance:  did not bring empty box Bottle Appearance:  none brought  Filled Date: ? / ? /  ? Last Medication intake:   Friday

## 2022-01-09 NOTE — Progress Notes (Signed)
PROVIDER NOTE: Information contained herein reflects review and annotations entered in association with encounter. Interpretation of such information and data should be left to medically-trained personnel. Information provided to patient can be located elsewhere in the medical record under "Patient Instructions". Document created using STT-dictation technology, any transcriptional errors that may result from process are unintentional.    Patient: Melissa Montes  Service Category: E/M  Provider: Gillis Santa, MD  DOB: [birth year]  DOS: 01/09/2022  Specialty: Interventional Pain Management  MRN: [id number]  Setting: Ambulatory outpatient  PCP: Perrin Maltese, MD  Type: Established Patient    Referring Provider: Perrin Maltese, MD  Location: Office  Delivery: Face-to-face     HPI  [name], a 66 y.o. year old female, is here today because of her Chronic pain syndrome [G89.4]. [name]'s primary complain today is chronic low back pain Last encounter: My last encounter with her was on 11/21/21 Pertinent problems: [name] has S/P coronary artery stent placement; Peripheral vascular disease ([place]); GAD (generalized anxiety disorder); S/P coronary artery bypass graft x 2; Osteoporosis, post-menopausal; Chronic pain syndrome; Lumbar facet arthropathy; Bilateral hip pain; Primary osteoarthritis of both hips; Chronic left shoulder pain; Right rotator cuff tear arthropathy; Left rotator cuff tear arthropathy; and Lumbar degenerative disc disease on their pertinent problem list. Pain Assessment: Severity of Chronic pain is reported as a 5 /10. Location: Back Lower, Left, Right/into the buttocks. Onset: More than a month ago. Quality: Discomfort, Constant, Sharp, Aching (hips feel like there is electicity in them.  shoulders are achy and wake her up.). Timing: Constant. Modifying factor(s): resting helps.  trying to save medications d/t insurance is not going to cover anymore so she is being  judicious with use.. Vitals:  height is $RemoveB'5\' 2"'[id number]$  (1.575 m) and weight is 108 lb (49 kg). Her temporal temperature is 97.4 F (36.3 C) (abnormal). Her blood pressure is 111/73 and her pulse is 110 (abnormal). Her respiration is 16 and oxygen saturation is 96%.   Reason for encounter: medication management.    Patient follows up today for medication management.  At her last clinic visit, her Butrans patch was increased to 10 mcg an hour.  She is finding benefit with that.  She is utilizing less tramadol as result.  Her last refill of tramadol was 10/31/2021 and she has only utilized 20 tablets since then.  She reserves this for breakthrough pain.  Today we will refill her Butrans patch for the next 6 months.  Pharmacotherapy Assessment   Analgesic: Butrans patch at 10 mcg an hour.  Continue tramadol 150 mg every 12 hrs for breakthrough pain Monitoring: [place] PMP: PDMP reviewed during this encounter.       Pharmacotherapy: No side-effects or adverse reactions reported. Compliance: No problems identified. Effectiveness: Clinically acceptable.  Janett Billow, RN  01/09/2022  2:07 PM  Sign when Signing Visit Nursing Pain Medication Assessment:  Safety precautions to be maintained throughout the outpatient stay will include: orient to surroundings, keep bed in low position, maintain call bell within reach at all times, provide assistance with transfer out of bed and ambulation.  Medication Inspection Compliance: Pill count conducted under aseptic conditions, in front of the patient. Neither the pills nor the bottle was removed from the patient's sight at any time. Once count was completed pills were immediately returned to the patient in their original bottle.  Medication #1: Tramadol (Ultram) Pill/Patch Count:  70 of 90 pills remain Pill/Patch Appearance: Markings consistent with prescribed  medication Bottle Appearance: Standard pharmacy container. Clearly labeled. Filled Date: 79 / 13 /  2022 Last Medication intake:  Yesterday  Medication #2: Buprenorphine (Suboxone) Pill/Patch Count:  did not bring empty box Pill/Patch Appearance:  did not bring empty box Bottle Appearance:  none brought  Filled Date: ? / ? /  ? Last Medication intake:   Friday      UDS:  Summary  Date Value Ref Range Status  02/07/2021 Note  Final    Comment:    ==================================================================== [employer] [facility] 13 (MW) ==================================================================== Test                             Result       Flag       Units  Drug Present and Declared for Prescription Verification   Tramadol                       >2907        EXPECTED   ng/mg creat   O-Desmethyltramadol            >2907        EXPECTED   ng/mg creat   N-Desmethyltramadol            >2907        EXPECTED   ng/mg creat    Source of tramadol is a prescription medication. O-desmethyltramadol    and N-desmethyltramadol are expected metabolites of tramadol.  ==================================================================== Test                      Result    Flag   Units      Ref Range   Creatinine              172              mg/dL      >=20 ==================================================================== Declared Medications:  The flagging and interpretation on this report are based on the  following declared medications.  Unexpected results may arise from  inaccuracies in the declared medications.   **Note: The testing scope of this panel includes these medications:   Tramadol   **Note: The testing scope of this panel does not include the  following reported medications:   Aspirin  Atorvastatin  Clopidogrel  Duloxetine  Ezetimibe  Fluticasone  Hydroxyzine  Meclizine  Metoprolol  Multivitamin  Nitroglycerin  Probiotic  Promethazine  Trazodone  Vitamin D3 ==================================================================== For clinical  consultation, please call [phone]. ====================================================================      ROS  Constitutional: Denies any fever or chills Gastrointestinal: No reported hemesis, hematochezia, vomiting, or acute GI distress Musculoskeletal:  Low back, bilateral hip joint pain Neurological: No reported episodes of acute onset apraxia, aphasia, dysarthria, agnosia, amnesia, paralysis, loss of coordination, or loss of consciousness  Medication Review  DULoxetine, Probiotic Product, Vitamin D3, aspirin EC, atorvastatin, buprenorphine, clopidogrel, ezetimibe, hydrOXYzine, metoprolol succinate, multivitamin, nitroGLYCERIN, promethazine, traMADol HCl, and traZODone  History Review  Allergy: [name] is allergic to effexor xr [venlafaxine hcl], penicillins, amoxicillin-pot clavulanate, and keflex [cephalexin]. Drug: [name]  reports no history of drug use. Alcohol:  reports no history of alcohol use. Tobacco:  reports that she quit smoking about 3 years ago. Her smoking use included cigarettes. She has a 41.00 pack-year smoking history. She has never used smokeless tobacco. Social: [name]  reports that she quit smoking about 3 years ago. Her smoking use included  cigarettes. She has a 41.00 pack-year smoking history. She has never used smokeless tobacco. She reports that she does not drink alcohol and does not use drugs. Medical:  has a past medical history of ADD (attention deficit disorder), Anxiety, Aortic aneurysm ([place]), Atherosclerosis of abdominal aorta ([place]), Bipolar disorder ([place]), Bulging of lumbar intervertebral disc, CAD (coronary artery disease), Cancer ([place]), COPD (chronic obstructive pulmonary disease) ([place]), Depression, Diverticulitis, Dyspnea, History of kidney stones, Hyperlipidemia, Hypertension, Insomnia, Lumbar spondylosis, MI (myocardial infarction) ([place]), Migraine, Osteoporosis, PONV (postoperative nausea and vomiting), PVD (peripheral vascular  disease) ([place]), 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 Surgical: [name]  has a past surgical history that includes Coronary angioplasty with stent; Abdominal hysterectomy; CAROTID ANGIOGRAPHY (N/A, 01/13/2018); Left Heart Cath (Right, 07/07/2018); Colonoscopy; iv INJECTIONS TO BACK; and ENDOVASCULAR REPAIR/STENT GRAFT (Bilateral, 12/14/2020). Family: family history includes Breast cancer in her maternal aunt; Breast cancer (age of onset: 89) in her mother; Depression in her sister.  Laboratory Chemistry Profile   Renal Lab Results  Component Value Date   BUN 18 04/28/2021   CREATININE 0.77 04/28/2021   GFRAA >60 07/17/2020   GFRNONAA >60 04/28/2021     Hepatic Lab Results  Component Value Date   AST 22 04/28/2021   ALT 16 04/28/2021   ALBUMIN 3.8 04/28/2021   ALKPHOS 119 04/28/2021     Electrolytes Lab Results  Component Value Date   NA 138 04/28/2021   K 4.0 04/28/2021   CL 100 04/28/2021   CALCIUM 9.2 04/28/2021     Bone No results found for: VD25OH, VD125OH2TOT, EY8144YJ8, HU3149FW2, 25OHVITD1, 25OHVITD2, 25OHVITD3, TESTOFREE, TESTOSTERONE   Inflammation (CRP: Acute Phase) (ESR: Chronic Phase) No results found for: CRP, ESRSEDRATE, LATICACIDVEN     Note: Above Lab results reviewed.  Recent Imaging Review  CT CHEST LUNG CA SCREEN LOW DOSE W/O CM CLINICAL DATA:  Lung cancer screening. Forty-one pack-year history. Former asymptomatic smoker.  EXAM: CT CHEST WITHOUT CONTRAST LOW-DOSE FOR LUNG CANCER SCREENING  TECHNIQUE: Multidetector CT imaging of the chest was performed following the standard protocol without IV contrast.  COMPARISON:  04/07/2020  FINDINGS: Cardiovascular: Previous median sternotomy and CABG procedure. Heart size normal. Aortic atherosclerosis. Coronary artery calcifications. No pericardial effusion.  Mediastinum/Nodes: No  enlarged mediastinal, hilar, or axillary lymph nodes. Thyroid gland, trachea, and esophagus demonstrate no significant findings.  Lungs/Pleura: No pleural effusion, airspace consolidation, or pneumothorax. Severe changes of paraseptal and centrilobular emphysema identified. The previously noted lung nodules are stable in the interval. There is a new solid subpleural nodule within the periphery of the right lower lobe which has a mean derived diameter of 5 mm.  Upper Abdomen: No acute abnormality. Right lobe of liver cyst measures 1 cm, image 58/2. Aortic stent graft identified. Stone within upper pole of the left kidney measures 3 mm.  Musculoskeletal: No chest wall mass or suspicious bone lesions identified.  IMPRESSION: 1. Lung-RADS 3, probably benign findings. Short-term follow-up in 6 months is recommended with repeat low-dose chest CT without contrast (please use the following order, "CT CHEST LCS NODULE FOLLOW-UP W/O CM"). 2. Aortic Atherosclerosis (ICD10-I70.0) and Emphysema (ICD10-J43.9). 3. Coronary artery calcifications.  Electronically Signed   By: Kerby Moors M.D.   On: 09/22/2021 16:25  Note: Reviewed        Physical Exam  General appearance: Well nourished, well developed, and well hydrated. In no apparent acute distress Mental status: Alert, oriented x 3 (person, place, &  time)       Respiratory: No evidence of acute respiratory distress Eyes: PERLA Vitals: BP 111/73 (BP Location: Right Arm, Patient Position: Sitting, Cuff Size: Normal)    Pulse (!) 110    Temp (!) 97.4 F (36.3 C) (Temporal)    Resp 16    Ht _0  (1.575 m)    Wt 108 lb (49 kg)    SpO2 96%    BMI 19.75 kg/m  BMI: Estimated body mass index is 19.75 kg/m as calculated from the following:   Height as of this encounter: _1  (1.575 m).   Weight as of this encounter: 108 lb (49 kg). Ideal: Ideal body weight: 50.1 kg (110 lb 7.2 oz)   Low back pain, worse with lumbar extension. 5 out of 5  strength bilateral lower extremity: Plantar flexion, dorsiflexion, knee flexion, knee extension.  Assessment   Status Diagnosis  Controlled Controlled Controlled 1. Chronic pain syndrome   2. Lumbar facet arthropathy   3. Bilateral hip pain   4. Chronic right shoulder pain   5. Primary osteoarthritis of both hips   6. Lumbar spondylosis   7. Right rotator cuff tear arthropathy   8. Lumbar degenerative disc disease   9. Chronic left shoulder pain        Plan of Care   [name] has a current medication list which includes the following long-term medication(s): atorvastatin, duloxetine, ezetimibe, metoprolol succinate, nitroglycerin, promethazine, tramadol hcl, and trazodone.  Pharmacotherapy (Medications Ordered): Meds ordered this encounter  Medications   buprenorphine (BUTRANS) 10 MCG/HR PTWK    Sig: Place 1 patch onto the skin once a week.    Dispense:  4 patch    Refill:  5    Chronic Pain: STOP Act (Not applicable) Fill 1 day early if closed on refill date. Avoid benzodiazepines within 8 hours of opioids  Continue Cymbalta as prescribed.  Counseled on risk of serotonin syndrome. Continue tramadol as needed for breakthrough pain.  No refills needed. Avoid NSAIDs as patient is on Plavix. Has tried and failed gabapentin and Lyrica in the past.  Follow-up plan:   Return in about 6 months (around 07/09/2022) for Medication Management, in person.     Status post bilateral L3, L4, L5 facet medial branch nerve block #2 3/15/202, 90% pain relief for 10-12 hrs, proceed with RFA.  Left L3, L4, L5 RFA 03/14/2020; Right L3,4,5 RFA 04/11/20.  Repeat as needed, consider sprint peripheral nerve stimulation of lumbar medial branch         Recent Visits Date Type Provider Dept  11/21/21 Office Visit Gillis Santa, MD [facility]  Showing recent visits within past 90 days and meeting all other requirements Today's Visits Date Type Provider Dept  01/09/22 Office  Visit Gillis Santa, MD [facility]  Showing today's visits and meeting all other requirements Future Appointments No visits were found meeting these conditions. Showing future appointments within next 90 days and meeting all other requirements  I discussed the assessment and treatment plan with the patient. The patient was provided an opportunity to ask questions and all were answered. The patient agreed with the plan and demonstrated an understanding of the instructions.  Patient advised to call back or seek an in-person evaluation if the symptoms or condition worsens.  Duration of encounter: 30 minutes.  Note by: Gillis Santa, MD Date: 01/09/2022; Time: 2:37 PM

## 2022-01-16 ENCOUNTER — Ambulatory Visit (INDEPENDENT_AMBULATORY_CARE_PROVIDER_SITE_OTHER): Payer: Medicare HMO | Admitting: Psychiatry

## 2022-01-16 ENCOUNTER — Other Ambulatory Visit: Payer: Self-pay

## 2022-01-16 ENCOUNTER — Encounter: Payer: Self-pay | Admitting: Psychiatry

## 2022-01-16 VITALS — BP 119/80 | HR 98 | Temp 98.7°F | Wt 110.6 lb

## 2022-01-16 DIAGNOSIS — R69 Illness, unspecified: Secondary | ICD-10-CM | POA: Diagnosis not present

## 2022-01-16 DIAGNOSIS — G4701 Insomnia due to medical condition: Secondary | ICD-10-CM | POA: Diagnosis not present

## 2022-01-16 DIAGNOSIS — F319 Bipolar disorder, unspecified: Secondary | ICD-10-CM | POA: Diagnosis not present

## 2022-01-16 DIAGNOSIS — I2510 Atherosclerotic heart disease of native coronary artery without angina pectoris: Secondary | ICD-10-CM | POA: Diagnosis not present

## 2022-01-16 DIAGNOSIS — F411 Generalized anxiety disorder: Secondary | ICD-10-CM

## 2022-01-16 DIAGNOSIS — E782 Mixed hyperlipidemia: Secondary | ICD-10-CM | POA: Diagnosis not present

## 2022-01-16 DIAGNOSIS — I10 Essential (primary) hypertension: Secondary | ICD-10-CM | POA: Diagnosis not present

## 2022-01-16 MED ORDER — TRAZODONE HCL 100 MG PO TABS: 100.0000 mg | ORAL_TABLET | Freq: Every day | ORAL | 1 refills | Status: DC

## 2022-01-16 MED ORDER — DULOXETINE HCL 30 MG PO CPEP: ORAL_CAPSULE | ORAL | 1 refills | Status: DC

## 2022-01-16 MED ORDER — GABAPENTIN 100 MG PO CAPS: 100.0000 mg | ORAL_CAPSULE | Freq: Every day | ORAL | 0 refills | Status: DC

## 2022-01-16 NOTE — Progress Notes (Signed)
[facility] MD OP Progress Note  01/16/2022 5:46 PM Melissa Montes  MRN:  [id number]  Chief Complaint:  Chief Complaint  Patient presents with   Follow-up: 66 year old Caucasian female with history of GAD, bipolar disorder, insomnia presented for medication management.   HPI: Melissa Montes is a 66 year old Caucasian female on disability, divorced, lives in [place], has a history of bipolar disorder, GAD,aortic aneurysm, diverticulitis, abdominal aortic aneurysm, chronic pain was evaluated in office today.   Patient today reports she is currently struggling with mood symptoms like anxiety, sadness, mood swings mostly because of relationship struggles.  She reports she is currently struggling with relationship problems with her siblings as well as a friend.  Patient reports she hence is currently trying to stay away from family.  She reports when she is around the it affects her emotionally.  Patient currently reports she is compliant on her medications.  She is currently not on a mood stabilizer, agreeable to a trial of gabapentin.  Patient reports sleep is overall okay as long as she takes her trazodone.  Patient continues to have low back pain and reports she is currently under the care of her pain providers.  Denies any suicidality, homicidality or perceptual disturbances.  Patient denies any other concerns today. Visit Diagnosis:    ICD-10-CM   1. GAD (generalized anxiety disorder)  F41.1 DULoxetine (CYMBALTA) 30 MG capsule    gabapentin (NEURONTIN) 100 MG capsule    2. Bipolar 1 disorder, depressed (HCC)  F31.9 gabapentin (NEURONTIN) 100 MG capsule   mild    3. Insomnia due to medical condition  G47.01 traZODone (DESYREL) 100 MG tablet   Pain      Past Psychiatric History: Reviewed past psychiatric history from progress note on 04/26/2020.  Past Medical History:  Past Medical History:  Diagnosis Date   ADD (attention deficit disorder)    Anxiety    Aortic aneurysm (HCC)     Atherosclerosis of abdominal aorta (HCC)    Bipolar disorder (HCC)    Bulging of lumbar intervertebral disc    CAD (coronary artery disease)    Cancer (HCC)    COPD (chronic obstructive pulmonary disease) (HCC)    Depression    Diverticulitis    Dyspnea    History of kidney stones    Hyperlipidemia    Hypertension    Insomnia    Lumbar spondylosis    MI (myocardial infarction) ([place])    2007, 2019   Migraine    Osteoporosis    PONV (postoperative nausea and vomiting)    PVD (peripheral vascular disease) (HCC)    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HCC)    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   SVT (supraventricular tachycardia) (HCC)     Past Surgical History:  Procedure Laterality Date   ABDOMINAL HYSTERECTOMY     CAROTID ANGIOGRAPHY N/A 01/13/2018   Procedure: CAROTID ANGIOGRAPHY;  Surgeon: Algernon Huxley, MD;  Location: [facility];  Service: Cardiovascular;  Laterality: N/A;   COLONOSCOPY     CORONARY ANGIOPLASTY WITH STENT PLACEMENT     ENDOVASCULAR REPAIR/STENT GRAFT Bilateral 12/14/2020   Procedure: ENDOVASCULAR REPAIR/STENT GRAFT;  Surgeon: Algernon Huxley, MD;  Location: [facility];  Service: Cardiovascular;  Laterality: Bilateral;   iv INJECTIONS TO BACK     LEFT HEART CATH Right 07/07/2018   Procedure: Left Heart Cath and Coronary Angiography;  Surgeon: Dionisio David, MD;  Location: [facility];  Service: Cardiovascular;  Laterality: Right;    Family Psychiatric History: Reviewed family psychiatric history from progress note on 04/26/2020.  Family History:  Family History  Problem Relation Age of Onset   Breast cancer Mother 34   Breast cancer Maternal Aunt        mat aunt and great aunt   Depression Sister     Social History: Reviewed social history from progress note on 04/26/2020. Social History   Socioeconomic History   Marital status: Divorced    Spouse name: Not on file   Number of children: Not on file    Years of education: Not on file   Highest education level: Not on file  Occupational History   Occupation: disabled  Tobacco Use   Smoking status: Former    Packs/day: 1.00    Years: 41.00    Pack years: 41.00    Types: Cigarettes    Quit date: 09/19/2018    Years since quitting: 3.3   Smokeless tobacco: Never   Tobacco comments:    uses nicotine gums  Substance and Sexual Activity   Alcohol use: No   Drug use: No   Sexual activity: Not on file  Other Topics Concern   Not on file  Social History Narrative   Not on file   Social Determinants of Health   Financial Resource Strain: Not on file  Food Insecurity: Not on file  Transportation Needs: Not on file  Physical Activity: Not on file  Stress: Not on file  Social Connections: Not on file    Allergies:  Allergies  Allergen Reactions   Effexor Xr [Venlafaxine Hcl]     suicidality   Penicillins Swelling   Amoxicillin-Pot Clavulanate    Keflex [Cephalexin] Hives    Metabolic Disorder Labs: No results found for: HGBA1C, MPG No results found for: PROLACTIN No results found for: CHOL, TRIG, HDL, CHOLHDL, VLDL, LDLCALC No results found for: TSH  Therapeutic Level Labs: No results found for: LITHIUM No results found for: VALPROATE No components found for:  CBMZ  Current Medications: Current Outpatient Medications  Medication Sig Dispense Refill   aspirin EC 81 MG tablet Take 162 mg by mouth every evening.      atorvastatin (LIPITOR) 80 MG tablet Take 80 mg by mouth every evening.      Cholecalciferol (VITAMIN D3) 1.25 MG (50000 UT) CAPS Take 1 tablet by mouth once a week.     clopidogrel (PLAVIX) 75 MG tablet Take 1 tablet (75 mg total) by mouth daily. 90 tablet 3   DULoxetine (CYMBALTA) 30 MG capsule TAKE 1 CAPSULE(30 MG) BY MOUTH DAILY 90 capsule 1   ezetimibe (ZETIA) 10 MG tablet Take 10 mg by mouth daily.      gabapentin (NEURONTIN) 100 MG capsule Take 1 capsule (100 mg total) by mouth at bedtime. 30  capsule 0   hydrOXYzine (VISTARIL) 25 MG capsule TAKE 1 CAPSULE BY MOUTH DAILY AS NEEDED FOR ANXIETY AND 2 CAPSULES AT BEDTIME AS NEEDED FOR SLEEP AS DIRECTED 90 capsule 1   metoprolol succinate (TOPROL-XL) 50 MG 24 hr tablet Take 50 mg by mouth daily.     Multiple Vitamin (MULTIVITAMIN) tablet Take 1 tablet by mouth daily. Centrum Silver for Women     nitroGLYCERIN (NITROSTAT) 0.4 MG SL tablet Place 0.4 mg under the tongue every 5 (five) minutes x 3 doses as needed for chest pain.      Probiotic Product (PROBIOTIC DAILY PO) Take 1 capsule by mouth daily.     promethazine (  PHENERGAN) 25 MG tablet Take 25 mg by mouth every 8 (eight) hours as needed for nausea or vomiting.     traMADol HCl 100 MG TABS Take 1.5 tablets by mouth 2 (two) times daily as needed. 90 tablet 1   traZODone (DESYREL) 100 MG tablet Take 1 tablet (100 mg total) by mouth at bedtime. 90 tablet 1   buprenorphine (BUTRANS) 10 MCG/HR PTWK Place 1 patch onto the skin once a week. 4 patch 5   No current facility-administered medications for this visit.     Musculoskeletal: Strength & Muscle Tone: within normal limits Gait & Station: normal Patient leans: N/A  Psychiatric Specialty Exam: Review of Systems  Musculoskeletal:  Positive for back pain.  Psychiatric/Behavioral:  The patient is nervous/anxious.        Mood swings  All other systems reviewed and are negative.  Blood pressure 119/80, pulse 98, temperature 98.7 F (37.1 C), temperature source Temporal, weight 110 lb 9.6 oz (50.2 kg).Body mass index is 20.23 kg/m.  General Appearance: Casual  Eye Contact:  Fair  Speech:  Clear and Coherent  Volume:  Normal  Mood:  Anxious, mood swings  Affect:  Congruent  Thought Process:  Goal Directed and Descriptions of Associations: Intact  Orientation:  Full (Time, Place, and Person)  Thought Content: Logical   Suicidal Thoughts:  No  Homicidal Thoughts:  No  Memory:  Immediate;   Fair Recent;   Fair Remote;   Fair   Judgement:  Fair  Insight:  Fair  Psychomotor Activity:  Normal  Concentration:  Concentration: Fair and Attention Span: Fair  Recall:  [employer] of Knowledge: Fair  Language: Fair  Akathisia:  No  Handed:  Right  AIMS (if indicated): done  Assets:  Communication Skills Desire for Improvement Housing Social Support  ADL's:  Intact  Cognition: WNL  Sleep:  Fair   Screenings: [facility] Office Visit from 01/16/2022 in [facility] Total Score 0      GAD-7    Flowsheet Row Video Visit from 04/26/2021 in [facility]  Total GAD-7 Score 1      PHQ2-9    [facility] Visit from 01/16/2022 in [facility] Office Visit from 09/26/2021 in [facility] Office Visit from 05/09/2021 in [facility] Video Visit from 04/26/2021 in [facility] Office Visit from 02/07/2021 in [facility]  PHQ-2 Total Score 2 0 0 1 0  PHQ-9 Total Score 8 -- -- -- --      Flowsheet Row Video Visit from 01/23/2021 in [facility] from 12/31/2020 in [facility] Admission (Discharged) from 12/14/2020 in [facility] No Risk No Risk No Risk        Assessment and Plan: Tamatha Gadbois is a 66 year old Caucasian female, disabled, divorced, lives in [place], has a history of bipolar disorder, GAD, aortic aneurysm status post recent repair, diverticulitis, MI status post stent placement, chronic pain, was evaluated in office today.  Patient currently struggling with mood symptoms, does have multiple psychosocial stressors, relationship struggles, will benefit from the following plan.  Plan GAD-unstable Continue Cymbalta 30 mg p.o.  daily Hydroxyzine 25 mg as needed for severe anxiety and sleep Referral for CBT.  Bipolar disorder depressed mild-unstable Start gabapentin 100 mg p.o. nightly  We will consider increasing the dosage as needed  Insomnia-stable Trazodone 100 mg p.o. nightly as needed Hydroxyzine 25 mg p.o. nightly as needed  Follow-up in clinic in 3 weeks or sooner if needed.   Collaboration of Care: Collaboration of Care: Referral or follow-up with counselor/therapist AEB communicated with staff to schedule  with therapist.  Patient/Guardian was advised Release of Information must be obtained prior to any record release in order to collaborate their care with an outside provider. Patient/Guardian was advised if they have not already done so to contact the registration department to sign all necessary forms in order for [place] to release information regarding their care.   Consent: Patient/Guardian gives verbal consent for treatment and assignment of benefits for services provided during this visit. Patient/Guardian expressed understanding and agreed to proceed.   This note was generated in part or whole with voice recognition software. Voice recognition is usually quite accurate but there are transcription errors that can and very often do occur. I apologize for any typographical errors that were not detected and corrected.      Ursula Alert, MD 01/17/2022, 1:11 PM

## 2022-01-17 ENCOUNTER — Telehealth: Payer: Self-pay | Admitting: Student in an Organized Health Care Education/Training Program

## 2022-01-17 ENCOUNTER — Other Ambulatory Visit: Payer: Self-pay | Admitting: Student in an Organized Health Care Education/Training Program

## 2022-01-17 DIAGNOSIS — M47816 Spondylosis without myelopathy or radiculopathy, lumbar region: Secondary | ICD-10-CM

## 2022-01-17 DIAGNOSIS — G894 Chronic pain syndrome: Secondary | ICD-10-CM

## 2022-01-17 MED ORDER — BUPRENORPHINE 10 MCG/HR TD PTWK: 1.0000 | MEDICATED_PATCH | TRANSDERMAL | 5 refills | Status: AC

## 2022-01-17 NOTE — Telephone Encounter (Signed)
Patient is calling about refill on buprenorphine? Looks like she should have refills at the pharmacy. Please call patient and advise.  ?

## 2022-01-17 NOTE — Progress Notes (Signed)
Patient notified that script resent to pharmacy. ?

## 2022-01-17 NOTE — Telephone Encounter (Signed)
Patient informed per voicemail that there are 5 refills on her prescription for Buprenorphine. ?

## 2022-01-22 ENCOUNTER — Telehealth: Payer: Self-pay | Admitting: Psychiatry

## 2022-01-22 NOTE — Telephone Encounter (Signed)
I have reviewed the following labs.  Reported-08/12/2021-received from her primary care provider-[facility] ? ?CMP-within normal limits except for alkaline phosphatase elevated at 128 ? ?CBC with differential-within normal limits except for neutrophils slightly elevated at 8.7, monocyte-1, eosinophils-0.7. ? ?Lipid panel-within normal limits. ? ?Thyroid panel-within normal limits. ? ?Vitamin D-62-within normal limits. ? ?We will scan the labs into the system. ?

## 2022-02-12 ENCOUNTER — Telehealth: Payer: Self-pay | Admitting: Psychiatry

## 2022-02-12 NOTE — Telephone Encounter (Signed)
Reviewed labs-dated 08/11/2021- ?BUN-within normal limits ?Creatinine-0.79-within normal limits. ?Sodium-143-within normal limits. ?AST/ALT-within normal limits. ?Alkaline phosphatase-elevated at 128. ? ? ?CBC with differential- ?WBC-elevated at 12.3. ?Neutrophil-elevated at 8.7 ?Monocyte absolute-1-elevated. ?Eosinophils-0.7-elevated. ?Otherwise within normal limits. ? ?Lipid panel-within normal limits-cholesterol, triglycerides, HDL, VLDL, LDL. ? ?TSH plus T4-T3-within normal limits. ? ?Vitamin D, 25-hydroxy-within normal limits at 82. ? ?We will scan labs received into her chart. ?

## 2022-02-14 ENCOUNTER — Telehealth (INDEPENDENT_AMBULATORY_CARE_PROVIDER_SITE_OTHER): Payer: Medicare HMO | Admitting: Psychiatry

## 2022-02-14 ENCOUNTER — Other Ambulatory Visit: Payer: Self-pay

## 2022-02-14 ENCOUNTER — Encounter: Payer: Self-pay | Admitting: Psychiatry

## 2022-02-14 DIAGNOSIS — F411 Generalized anxiety disorder: Secondary | ICD-10-CM | POA: Diagnosis not present

## 2022-02-14 DIAGNOSIS — F319 Bipolar disorder, unspecified: Secondary | ICD-10-CM | POA: Diagnosis not present

## 2022-02-14 DIAGNOSIS — G4701 Insomnia due to medical condition: Secondary | ICD-10-CM | POA: Diagnosis not present

## 2022-02-14 DIAGNOSIS — R69 Illness, unspecified: Secondary | ICD-10-CM | POA: Diagnosis not present

## 2022-02-14 MED ORDER — GABAPENTIN 300 MG PO CAPS: 300.0000 mg | ORAL_CAPSULE | Freq: Every day | ORAL | 0 refills | Status: DC

## 2022-02-14 NOTE — Progress Notes (Signed)
Virtual Visit via Video Note ? ?I connected with Melissa Montes on 02/14/22 at  2:30 PM EDT by a video enabled telemedicine application and verified that I am speaking with the correct person using two identifiers. ? ?Location ?Provider Location : [facility] ?Patient Location : Home ? ?Participants: Patient , Provider ? ?  ?I discussed the limitations of evaluation and management by telemedicine and the availability of in person appointments. The patient expressed understanding and agreed to proceed. ?  ?I discussed the assessment and treatment plan with the patient. The patient was provided an opportunity to ask questions and all were answered. The patient agreed with the plan and demonstrated an understanding of the instructions. ?  ?The patient was advised to call back or seek an in-person evaluation if the symptoms worsen or if the condition fails to improve as anticipated. ? ?[facility] MD OP Progress Note ? ?02/14/2022 2:41 PM ?Melissa Montes  ?MRN:  [id number] ? ?Chief Complaint:  ?Chief Complaint  ?Patient presents with  ? Follow-up: 66 year old Caucasian female with history of GAD, bipolar disorder, insomnia, presented for medication management.  ? ?HPI: Melissa Montes is a 66 year old Caucasian female on disability, divorced, lives in [place], has a history of bipolar disorder, GAD, diverticulitis, abdominal aortic aneurysm, chronic pain was evaluated by telemedicine today. ? ?Patient today reports she is currently improving with regards to her mood symptoms.  Mood swings are improving as well as anxiety is more under control.  Patient reports gabapentin is helpful.  She reports the gabapentin also helps with pain.  She reports she is interested in dosage increase if possible. ? ?Patient reports sleep is good. ? ?Denies any sadness or crying spells. ? ?Reports appetite is fair. ? ?Currently compliant on all her medications otherwise.  Denies side effects. ? ?Reports relationship with her brother is getting  better. ? ?Denies any other concerns today. ? ? ? ? ? ?Visit Diagnosis:  ?  ICD-10-CM   ?1. GAD (generalized anxiety disorder)  F41.1 gabapentin (NEURONTIN) 300 MG capsule  ?  ?2. Bipolar 1 disorder, depressed (HCC)  F31.9 gabapentin (NEURONTIN) 300 MG capsule  ?  ?3. Insomnia due to medical condition  G47.01 gabapentin (NEURONTIN) 300 MG capsule  ? pain  ?  ? ? ?Past Psychiatric History: Reviewed past psychiatric history from progress note on 04/26/2020. ? ?Past Medical History:  ?Past Medical History:  ?Diagnosis Date  ? ADD (attention deficit disorder)   ? Anxiety   ? Aortic aneurysm ([place])   ? Atherosclerosis of abdominal aorta ([place])   ? Bipolar disorder ([place])   ? Bulging of lumbar intervertebral disc   ? CAD (coronary artery disease)   ? Cancer [facility])   ? COPD (chronic obstructive pulmonary disease) ([place])   ? Depression   ? Diverticulitis   ? Dyspnea   ? History of kidney stones   ? Hyperlipidemia   ? Hypertension   ? Insomnia   ? Lumbar spondylosis   ? MI (myocardial infarction) ([place])   ? 2007, 2019  ? Migraine   ? Osteoporosis   ? PONV (postoperative nausea and vomiting)   ? PVD (peripheral vascular disease) ([place])   ? 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HCC)   ? 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 SVT (supraventricular tachycardia) ([place])   ?  ?Past Surgical History:  ?Procedure Laterality Date  ? ABDOMINAL HYSTERECTOMY    ? CAROTID ANGIOGRAPHY N/A 01/13/2018  ? Procedure: CAROTID ANGIOGRAPHY;  Surgeon: Leotis Pain  S, MD;  Location: [facility];  Service: Cardiovascular;  Laterality: N/A;  ? COLONOSCOPY    ? CORONARY ANGIOPLASTY WITH STENT PLACEMENT    ? ENDOVASCULAR REPAIR/STENT GRAFT Bilateral 12/14/2020  ? Procedure: ENDOVASCULAR REPAIR/STENT GRAFT;  Surgeon: Algernon Huxley, MD;  Location: [facility];  Service: Cardiovascular;  Laterality: Bilateral;  ? iv INJECTIONS TO BACK    ? LEFT HEART CATH Right 07/07/2018  ? Procedure: Left Heart Cath and Coronary Angiography;   Surgeon: Dionisio David, MD;  Location: [facility];  Service: Cardiovascular;  Laterality: Right;  ? ? ?Family Psychiatric History: Reviewed family psychiatric history from progress note on 04/26/2020. ? ?Family History:  ?Family History  ?Problem Relation Age of Onset  ? Breast cancer Mother 70  ? Breast cancer Maternal Aunt   ?     mat aunt and great aunt  ? Depression Sister   ? ? ?Social History: Reviewed social history from progress note on 04/26/2020. ?Social History  ? ?Socioeconomic History  ? Marital status: Divorced  ?  Spouse name: Not on file  ? Number of children: Not on file  ? Years of education: Not on file  ? Highest education level: Not on file  ?Occupational History  ? Occupation: disabled  ?Tobacco Use  ? Smoking status: Former  ?  Packs/day: 1.00  ?  Years: 41.00  ?  Pack years: 41.00  ?  Types: Cigarettes  ?  Quit date: 09/19/2018  ?  Years since quitting: 3.4  ? Smokeless tobacco: Never  ? Tobacco comments:  ?  uses nicotine gums  ?Substance and Sexual Activity  ? Alcohol use: No  ? Drug use: No  ? Sexual activity: Not on file  ?Other Topics Concern  ? Not on file  ?Social History Narrative  ? Not on file  ? ?Social Determinants of Health  ? ?Financial Resource Strain: Not on file  ?Food Insecurity: Not on file  ?Transportation Needs: Not on file  ?Physical Activity: Not on file  ?Stress: Not on file  ?Social Connections: Not on file  ? ? ?Allergies:  ?Allergies  ?Allergen Reactions  ? Effexor Xr [Venlafaxine Hcl]   ?  suicidality  ? Penicillins Swelling  ? Amoxicillin-Pot Clavulanate   ? Keflex [Cephalexin] Hives  ? ? ?Metabolic Disorder Labs: ?No results found for: HGBA1C, MPG ?No results found for: PROLACTIN ?No results found for: CHOL, TRIG, HDL, CHOLHDL, VLDL, LDLCALC ?No results found for: TSH ? ?Therapeutic Level Labs: ?No results found for: LITHIUM ?No results found for: VALPROATE ?No components found for:  CBMZ ? ?Current Medications: ?Current Outpatient Medications  ?Medication  Sig Dispense Refill  ? gabapentin (NEURONTIN) 300 MG capsule Take 1 capsule (300 mg total) by mouth at bedtime. 90 capsule 0  ? aspirin EC 81 MG tablet Take 162 mg by mouth every evening.     ? atorvastatin (LIPITOR) 80 MG tablet Take 80 mg by mouth every evening.     ? buprenorphine (BUTRANS) 10 MCG/HR PTWK Place 1 patch onto the skin once a week. 4 patch 5  ? Cholecalciferol (VITAMIN D3) 1.25 MG (50000 UT) CAPS Take 1 tablet by mouth once a week.    ? clopidogrel (PLAVIX) 75 MG tablet Take 1 tablet (75 mg total) by mouth daily. 90 tablet 3  ? DULoxetine (CYMBALTA) 30 MG capsule TAKE 1 CAPSULE(30 MG) BY MOUTH DAILY 90 capsule 1  ? ezetimibe (ZETIA) 10 MG tablet Take 10 mg by mouth  daily.     ? hydrOXYzine (VISTARIL) 25 MG capsule TAKE 1 CAPSULE BY MOUTH DAILY AS NEEDED FOR ANXIETY AND 2 CAPSULES AT BEDTIME AS NEEDED FOR SLEEP AS DIRECTED 90 capsule 1  ? metoprolol succinate (TOPROL-XL) 50 MG 24 hr tablet Take 50 mg by mouth daily.    ? Multiple Vitamin (MULTIVITAMIN) tablet Take 1 tablet by mouth daily. Centrum Silver for Women    ? nitroGLYCERIN (NITROSTAT) 0.4 MG SL tablet Place 0.4 mg under the tongue every 5 (five) minutes x 3 doses as needed for chest pain.     ? Probiotic Product (PROBIOTIC DAILY PO) Take 1 capsule by mouth daily.    ? promethazine (PHENERGAN) 25 MG tablet Take 25 mg by mouth every 8 (eight) hours as needed for nausea or vomiting.    ? traMADol HCl 100 MG TABS Take 1.5 tablets by mouth 2 (two) times daily as needed. 90 tablet 1  ? traZODone (DESYREL) 100 MG tablet Take 1 tablet (100 mg total) by mouth at bedtime. 90 tablet 1  ? ?No current facility-administered medications for this visit.  ? ? ? ?Musculoskeletal: ?Strength & Muscle Tone:  UTA ?Gait & Station:  Seated ?Patient leans: N/A ? ?Psychiatric Specialty Exam: ?Review of Systems  ?Musculoskeletal:  Positive for back pain.  ?Psychiatric/Behavioral:  The patient is nervous/anxious.   ?     Mood swings  ?All other systems reviewed and are  negative.  ?There were no vitals taken for this visit.There is no height or weight on file to calculate BMI.  ?General Appearance: Casual  ?Eye Contact:  Fair  ?Speech:  Clear and Coherent  ?Volume:  No

## 2022-02-16 ENCOUNTER — Encounter (INDEPENDENT_AMBULATORY_CARE_PROVIDER_SITE_OTHER): Payer: Self-pay | Admitting: Nurse Practitioner

## 2022-02-16 ENCOUNTER — Ambulatory Visit (INDEPENDENT_AMBULATORY_CARE_PROVIDER_SITE_OTHER): Payer: Medicare HMO

## 2022-02-16 ENCOUNTER — Ambulatory Visit (INDEPENDENT_AMBULATORY_CARE_PROVIDER_SITE_OTHER): Payer: Medicare HMO | Admitting: Nurse Practitioner

## 2022-02-16 VITALS — BP 98/74 | HR 73 | Resp 17 | Ht 62.0 in | Wt 111.2 lb

## 2022-02-16 DIAGNOSIS — I6523 Occlusion and stenosis of bilateral carotid arteries: Secondary | ICD-10-CM

## 2022-02-16 DIAGNOSIS — I10 Essential (primary) hypertension: Secondary | ICD-10-CM | POA: Diagnosis not present

## 2022-02-16 DIAGNOSIS — E7800 Pure hypercholesterolemia, unspecified: Secondary | ICD-10-CM

## 2022-02-16 DIAGNOSIS — I714 Abdominal aortic aneurysm, without rupture, unspecified: Secondary | ICD-10-CM

## 2022-02-16 DIAGNOSIS — I7143 Infrarenal abdominal aortic aneurysm, without rupture: Secondary | ICD-10-CM | POA: Diagnosis not present

## 2022-02-17 ENCOUNTER — Encounter (INDEPENDENT_AMBULATORY_CARE_PROVIDER_SITE_OTHER): Payer: Self-pay | Admitting: Nurse Practitioner

## 2022-02-17 NOTE — Progress Notes (Signed)
? ?Subjective:  ? ? Patient ID: Melissa Montes, female    DOB: 1956-05-25, 66 y.o.   MRN: [id number] ?Chief Complaint  ?Patient presents with  ? Follow-up  ?  ultrasound  ? ? ?Patient returns today in follow up of her abdominal aortic aneurysm.  The patient also has some carotid artery stenosis that we are following as well.  She underwent EVAR on 12/14/2020.  She has done well since.  She denies any abdominal pain or symptoms of distal embolization.  There are no signs symptoms of amaurosis fugax or any TIA or CVA-like symptoms.  Today the diameter measures 3.6 cm.  Previous EVAR measured 3.2 cm however before that it measured 3.7 cm.  There is imaging today is more consistent with previous exams and the images.  There is no endoleak noted. ? ?Today noninvasive studies show a right carotid 1 to 39% stenosis with 40 to 59% in the left, however this is more consistent within the bifurcation region. ? ? ?Review of Systems  ?All other systems reviewed and are negative. ? ?   ?Objective:  ? Physical Exam ?Vitals reviewed.  ?HENT:  ?   Head: Normocephalic.  ?Cardiovascular:  ?   Rate and Rhythm: Normal rate.  ?   Pulses: Normal pulses.  ?Pulmonary:  ?   Effort: Pulmonary effort is normal.  ?Skin: ?   General: Skin is warm and dry.  ?Neurological:  ?   Mental Status: She is alert and oriented to person, place, and time.  ?Psychiatric:     ?   Mood and Affect: Mood normal.     ?   Behavior: Behavior normal.     ?   Thought Content: Thought content normal.     ?   Judgment: Judgment normal.  ? ? ?BP 98/74 (BP Location: Left Arm)   Pulse 73   Resp 17   Ht '5\' 2"'$  (1.575 m)   Wt 111 lb 3.2 oz (50.4 kg)   BMI 20.34 kg/m?  ? ?Past Medical History:  ?Diagnosis Date  ? ADD (attention deficit disorder)   ? Anxiety   ? Aortic aneurysm ([place])   ? Atherosclerosis of abdominal aorta ([place])   ? Bipolar disorder ([place])   ? Bulging of lumbar intervertebral disc   ? CAD (coronary artery disease)   ? Cancer [facility])   ? COPD (chronic  obstructive pulmonary disease) ([place])   ? Depression   ? Diverticulitis   ? Dyspnea   ? History of kidney stones   ? Hyperlipidemia   ? Hypertension   ? Insomnia   ? Lumbar spondylosis   ? MI (myocardial infarction) ([place])   ? 2007, 2019  ? Migraine   ? Osteoporosis   ? PONV (postoperative nausea and vomiting)   ? PVD (peripheral vascular disease) ([place])   ? 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HCC)   ? 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 SVT (supraventricular tachycardia) ([place])   ? ? ?Social History  ? ?Socioeconomic History  ? Marital status: Divorced  ?  Spouse name: Not on file  ? Number of children: Not on file  ? Years of education: Not on file  ? Highest education level: Not on file  ?Occupational History  ? Occupation: disabled  ?Tobacco Use  ? Smoking status: Former  ?  Packs/day: 1.00  ?  Years: 41.00  ?  Pack years: 41.00  ?  Types: Cigarettes  ?  Quit date: 09/19/2018  ?  Years since quitting: 3.4  ? Smokeless tobacco: Never  ? Tobacco comments:  ?  uses nicotine gums  ?Substance and Sexual Activity  ? Alcohol use: No  ? Drug use: No  ? Sexual activity: Not on file  ?Other Topics Concern  ? Not on file  ?Social History Narrative  ? Not on file  ? ?Social Determinants of Health  ? ?Financial Resource Strain: Not on file  ?Food Insecurity: Not on file  ?Transportation Needs: Not on file  ?Physical Activity: Not on file  ?Stress: Not on file  ?Social Connections: Not on file  ?Intimate Partner Violence: Not on file  ? ? ?Past Surgical History:  ?Procedure Laterality Date  ? ABDOMINAL HYSTERECTOMY    ? CAROTID ANGIOGRAPHY N/A 01/13/2018  ? Procedure: CAROTID ANGIOGRAPHY;  Surgeon: Algernon Huxley, MD;  Location: [place] [facility];  Service: Cardiovascular;  Laterality: N/A;  ? COLONOSCOPY    ? CORONARY ANGIOPLASTY WITH STENT PLACEMENT    ? ENDOVASCULAR REPAIR/STENT GRAFT Bilateral 12/14/2020  ? Procedure: ENDOVASCULAR REPAIR/STENT GRAFT;  Surgeon: Algernon Huxley, MD;  Location: [place] [facility];  Service: Cardiovascular;  Laterality: Bilateral;  ? iv INJECTIONS TO BACK    ? LEFT HEART CATH Right 07/07/2018  ? Procedure: Left Heart Cath and Coronary Angiography;  Surgeon: Dionisio David, MD;  Location: [place] [facility];  Service: Cardiovascular;  Laterality: Right;  ? ? ?Family History  ?Problem Relation Age of Onset  ? Breast cancer Mother 12  ? Breast cancer Maternal Aunt   ?     mat aunt and great aunt  ? Depression Sister   ? ? ?Allergies  ?Allergen Reactions  ? Effexor Xr [Venlafaxine Hcl]   ?  suicidality  ? Penicillins Swelling  ? Amoxicillin-Pot Clavulanate   ? Keflex [Cephalexin] Hives  ? ? ? ?  Latest Ref Rng & Units 04/28/2021  ?  1:39 PM 12/31/2020  ?  5:45 PM 12/15/2020  ?  3:14 AM  ?CBC  ?WBC 4.0 - 10.5 K/uL 9.0   10.4   16.5    ?Hemoglobin 12.0 - 15.0 g/dL 13.9   12.5   11.4    ?Hematocrit 36.0 - 46.0 % 41.1   37.9   34.1    ?Platelets 150 - 400 K/uL 270   363   214    ? ? ? ? ?CMP  ?   ?Component Value Date/Time  ? NA 138 04/28/2021 1339  ? K 4.0 04/28/2021 1339  ? CL 100 04/28/2021 1339  ? CO2 28 04/28/2021 1339  ? GLUCOSE 123 (H) 04/28/2021 1339  ? BUN 18 04/28/2021 1339  ? CREATININE 0.77 04/28/2021 1339  ? CALCIUM 9.2 04/28/2021 1339  ? PROT 7.0 04/28/2021 1339  ? ALBUMIN 3.8 04/28/2021 1339  ? AST 22 04/28/2021 1339  ? ALT 16 04/28/2021 1339  ? ALKPHOS 119 04/28/2021 1339  ? BILITOT 0.5 04/28/2021 1339  ? GFRNONAA >60 04/28/2021 1339  ? GFRAA >60 07/17/2020 0855  ? ? ? ?No results found. ? ?   ?Assessment & Plan:  ? ?1. Infrarenal abdominal aortic aneurysm (AAA) without rupture ([place]) ?Recommend: Patient is status post successful endovascular repair of the AAA.  ? ?No further intervention is required at this time.   ?No endoleak is detected and the aneurysm sac is stable. ? ?The patient will continue antiplatelet therapy as prescribed as well as aggressive management of hyperlipidemia. Exercise is again strongly encouraged.  ? ?However, endografts require continued  surveillance with ultrasound or CT scan. This is mandatory to detect any changes that allow repressurization of the aneurysm sac.  The patient is informed that this would be asymptomatic. ? ?The patient is reminded that lifelong routine surveillance is a necessity with an endograft. Patient will continue to follow-up at 12 month intervals with ultrasound of the aorta.  ? ?2. Bilateral carotid artery stenosis ?Recommend: ? ?Given the patient's asymptomatic subcritical stenosis no further invasive testing or surgery at this time. ? ?Today noninvasive studies show a right carotid 1 to 39% stenosis with 40 to 59% in the left, however this is more consistent within the bifurcation region. ? ?Continue antiplatelet therapy as prescribed ?Continue management of CAD, HTN and Hyperlipidemia ?Healthy heart diet,  encouraged exercise at least 4 times per week ?Follow up in 12 months with duplex ultrasound and physical exam   ? ?3. Benign essential hypertension ?Continue antihypertensive medications as already ordered, these medications have been reviewed and there are no changes at this time.  ? ?4. Pure hypercholesterolemia ?Continue statin as ordered and reviewed, no changes at this time  ? ? ?Current Outpatient Medications on File Prior to Visit  ?Medication Sig Dispense Refill  ? aspirin EC 81 MG tablet Take 162 mg by mouth every evening.     ? atorvastatin (LIPITOR) 80 MG tablet Take 80 mg by mouth every evening.     ? buprenorphine (BUTRANS) 10 MCG/HR PTWK Place 1 patch onto the skin once a week. 4 patch 5  ? Cholecalciferol (VITAMIN D3) 1.25 MG (50000 UT) CAPS Take 1 tablet by mouth once a week.    ? clopidogrel (PLAVIX) 75 MG tablet Take 1 tablet (75 mg total) by mouth daily. 90 tablet 3  ? DULoxetine (CYMBALTA) 30 MG capsule TAKE 1 CAPSULE(30 MG) BY MOUTH DAILY 90 capsule 1  ? ezetimibe (ZETIA) 10 MG tablet Take 10 mg by mouth daily.     ? gabapentin (NEURONTIN) 300 MG capsule Take 1 capsule (300 mg total) by mouth at  bedtime. 90 capsule 0  ? hydrOXYzine (VISTARIL) 25 MG capsule TAKE 1 CAPSULE BY MOUTH DAILY AS NEEDED FOR ANXIETY AND 2 CAPSULES AT BEDTIME AS NEEDED FOR SLEEP AS DIRECTED 90 capsule 1  ? metoprolol succinate (TOPROL-XL) 5

## 2022-02-22 DIAGNOSIS — J449 Chronic obstructive pulmonary disease, unspecified: Secondary | ICD-10-CM | POA: Diagnosis not present

## 2022-02-22 DIAGNOSIS — I25810 Atherosclerosis of coronary artery bypass graft(s) without angina pectoris: Secondary | ICD-10-CM | POA: Diagnosis not present

## 2022-02-22 DIAGNOSIS — E782 Mixed hyperlipidemia: Secondary | ICD-10-CM | POA: Diagnosis not present

## 2022-02-22 DIAGNOSIS — I340 Nonrheumatic mitral (valve) insufficiency: Secondary | ICD-10-CM | POA: Diagnosis not present

## 2022-02-22 DIAGNOSIS — I10 Essential (primary) hypertension: Secondary | ICD-10-CM | POA: Diagnosis not present

## 2022-02-22 DIAGNOSIS — I2510 Atherosclerotic heart disease of native coronary artery without angina pectoris: Secondary | ICD-10-CM | POA: Diagnosis not present

## 2022-04-17 ENCOUNTER — Telehealth (INDEPENDENT_AMBULATORY_CARE_PROVIDER_SITE_OTHER): Payer: Medicare HMO | Admitting: Psychiatry

## 2022-04-17 ENCOUNTER — Encounter: Payer: Self-pay | Admitting: Psychiatry

## 2022-04-17 DIAGNOSIS — G4701 Insomnia due to medical condition: Secondary | ICD-10-CM

## 2022-04-17 DIAGNOSIS — R69 Illness, unspecified: Secondary | ICD-10-CM | POA: Diagnosis not present

## 2022-04-17 DIAGNOSIS — F411 Generalized anxiety disorder: Secondary | ICD-10-CM | POA: Diagnosis not present

## 2022-04-17 DIAGNOSIS — F3176 Bipolar disorder, in full remission, most recent episode depressed: Secondary | ICD-10-CM | POA: Diagnosis not present

## 2022-04-17 MED ORDER — GABAPENTIN 300 MG PO CAPS: 300.0000 mg | ORAL_CAPSULE | Freq: Every day | ORAL | 1 refills | Status: DC

## 2022-04-17 NOTE — Progress Notes (Signed)
Virtual Visit via Video Note  I connected with Melissa Montes on 04/17/22 at  1:30 PM EDT by a video enabled telemedicine application and verified that I am speaking with the correct person using two identifiers.  Location Provider Location : [facility] Patient Location : Home  Participants: Patient , Provider    I discussed the limitations of evaluation and management by telemedicine and the availability of in person appointments. The patient expressed understanding and agreed to proceed.   I discussed the assessment and treatment plan with the patient. The patient was provided an opportunity to ask questions and all were answered. The patient agreed with the plan and demonstrated an understanding of the instructions.   The patient was advised to call back or seek an in-person evaluation if the symptoms worsen or if the condition fails to improve as anticipated.   [place] MD OP Progress Note  04/17/2022 2:31 PM Melissa Montes  MRN:  [id number]  Chief Complaint:  Chief Complaint  Patient presents with   Follow-up: 66 year old Caucasian female with history of GAD, bipolar disorder, insomnia, presented for medication management.   HPI: Melissa Montes is a 66 year old Caucasian female on disability, divorced, lives in [place], has a history of bipolar disorder, GAD, diverticulitis, abdominal aortic aneurysm, chronic pain was evaluated by telemedicine today.  Patient today reports she is overall doing fairly well on the gabapentin.  The gabapentin helps with her mood as well as her sleep and pain.  Her pain is more manageable right now.  Patient reports since the gabapentin helps with sleep she has not needed the trazodone.  She reports anxiety symptoms are also improving on the current combination of medications.  She rarely takes the hydroxyzine as needed.  Patient denies any suicidality, homicidality or perceptual disturbances.  Patient denies side effects to  medications.  Patient denies any other concerns today.  Visit Diagnosis:    ICD-10-CM   1. GAD (generalized anxiety disorder)  F41.1 gabapentin (NEURONTIN) 300 MG capsule    2. Bipolar disorder, in full remission, most recent episode depressed (HCC)  F31.76 gabapentin (NEURONTIN) 300 MG capsule    3. Insomnia due to medical condition  G47.01 gabapentin (NEURONTIN) 300 MG capsule   pain      Past Psychiatric History: Reviewed past psychiatric history from progress note on 04/26/2020.  Past Medical History:  Past Medical History:  Diagnosis Date   ADD (attention deficit disorder)    Anxiety    Aortic aneurysm (HCC)    Atherosclerosis of abdominal aorta (HCC)    Bipolar disorder ([place])    Bulging of lumbar intervertebral disc    CAD (coronary artery disease)    Cancer (HCC)    COPD (chronic obstructive pulmonary disease) (HCC)    Depression    Diverticulitis    Dyspnea    History of kidney stones    Hyperlipidemia    Hypertension    Insomnia    Lumbar spondylosis    MI (myocardial infarction) ([place])    2007, 2019   Migraine    Osteoporosis    PONV (postoperative nausea and vomiting)    PVD (peripheral vascular disease) (HCC)    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HCC)    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   SVT (supraventricular tachycardia) ([place])     Past Surgical History:  Procedure Laterality Date   ABDOMINAL HYSTERECTOMY     CAROTID ANGIOGRAPHY N/A 01/13/2018   Procedure: CAROTID ANGIOGRAPHY;  Surgeon: Algernon Huxley,  MD;  Location: [place] [facility];  Service: Cardiovascular;  Laterality: N/A;   COLONOSCOPY     CORONARY ANGIOPLASTY WITH STENT PLACEMENT     ENDOVASCULAR REPAIR/STENT GRAFT Bilateral 12/14/2020   Procedure: ENDOVASCULAR REPAIR/STENT GRAFT;  Surgeon: Algernon Huxley, MD;  Location: [place] [facility];  Service: Cardiovascular;  Laterality: Bilateral;   iv INJECTIONS TO BACK     LEFT HEART CATH Right 07/07/2018   Procedure:  Left Heart Cath and Coronary Angiography;  Surgeon: Dionisio David, MD;  Location: [place] [facility];  Service: Cardiovascular;  Laterality: Right;    Family Psychiatric History: Reviewed family psychiatric history from progress note on 04/26/2020.  Family History:  Family History  Problem Relation Age of Onset   Breast cancer Mother 80   Breast cancer Maternal Aunt        mat aunt and great aunt   Depression Sister     Social History: Reviewed social history from progress note on 04/26/2020. Social History   Socioeconomic History   Marital status: Divorced    Spouse name: Not on file   Number of children: Not on file   Years of education: Not on file   Highest education level: Not on file  Occupational History   Occupation: disabled  Tobacco Use   Smoking status: Former    Packs/day: 1.00    Years: 41.00    Pack years: 41.00    Types: Cigarettes    Quit date: 09/19/2018    Years since quitting: 3.5   Smokeless tobacco: Never   Tobacco comments:    uses nicotine gums  Substance and Sexual Activity   Alcohol use: No   Drug use: No   Sexual activity: Not on file  Other Topics Concern   Not on file  Social History Narrative   Not on file   Social Determinants of Health   Financial Resource Strain: Not on file  Food Insecurity: Not on file  Transportation Needs: Not on file  Physical Activity: Not on file  Stress: Not on file  Social Connections: Not on file    Allergies:  Allergies  Allergen Reactions   Effexor Xr [Venlafaxine Hcl]     suicidality   Penicillins Swelling   Amoxicillin-Pot Clavulanate    Keflex [Cephalexin] Hives    Metabolic Disorder Labs: No results found for: HGBA1C, MPG No results found for: PROLACTIN No results found for: CHOL, TRIG, HDL, CHOLHDL, VLDL, LDLCALC No results found for: TSH  Therapeutic Level Labs: No results found for: LITHIUM No results found for: VALPROATE No components found for:  CBMZ  Current  Medications: Current Outpatient Medications  Medication Sig Dispense Refill   aspirin EC 81 MG tablet Take 162 mg by mouth every evening.      atorvastatin (LIPITOR) 80 MG tablet Take 80 mg by mouth every evening.      buprenorphine (BUTRANS) 10 MCG/HR PTWK 1 patch once a week.     Cholecalciferol (VITAMIN D3) 1.25 MG (50000 UT) CAPS Take 1 tablet by mouth once a week.     clopidogrel (PLAVIX) 75 MG tablet Take 1 tablet (75 mg total) by mouth daily. 90 tablet 3   DULoxetine (CYMBALTA) 30 MG capsule TAKE 1 CAPSULE(30 MG) BY MOUTH DAILY 90 capsule 1   ezetimibe (ZETIA) 10 MG tablet Take 10 mg by mouth daily.      hydrOXYzine (VISTARIL) 25 MG capsule TAKE 1 CAPSULE BY MOUTH DAILY AS NEEDED FOR ANXIETY AND 2 CAPSULES AT BEDTIME  AS NEEDED FOR SLEEP AS DIRECTED 90 capsule 1   metoprolol succinate (TOPROL-XL) 50 MG 24 hr tablet Take 50 mg by mouth daily.     Multiple Vitamin (MULTIVITAMIN) tablet Take 1 tablet by mouth daily. Centrum Silver for Women     nitroGLYCERIN (NITROSTAT) 0.4 MG SL tablet Place 0.4 mg under the tongue every 5 (five) minutes x 3 doses as needed for chest pain.      Probiotic Product (PROBIOTIC DAILY PO) Take 1 capsule by mouth daily.     promethazine (PHENERGAN) 25 MG tablet Take 25 mg by mouth every 8 (eight) hours as needed for nausea or vomiting.     gabapentin (NEURONTIN) 300 MG capsule Take 1 capsule (300 mg total) by mouth at bedtime. 90 capsule 1   traMADol HCl 100 MG TABS Take 1.5 tablets by mouth 2 (two) times daily as needed. 90 tablet 1   traZODone (DESYREL) 100 MG tablet Take 1 tablet (100 mg total) by mouth at bedtime. (Patient not taking: Reported on 04/17/2022) 90 tablet 1   No current facility-administered medications for this visit.     Musculoskeletal: Strength & Muscle Tone:  UTA Gait & Station:  Seated Patient leans: N/A  Psychiatric Specialty Exam: Review of Systems  Musculoskeletal:  Positive for back pain (Chronic).  Psychiatric/Behavioral:  Negative.    All other systems reviewed and are negative.  There were no vitals taken for this visit.There is no height or weight on file to calculate BMI.  General Appearance: Casual  Eye Contact:  Fair  Speech:  Clear and Coherent  Volume:  Normal  Mood:  Euthymic  Affect:  Congruent  Thought Process:  Goal Directed and Descriptions of Associations: Intact  Orientation:  Full (Time, Place, and Person)  Thought Content: Logical   Suicidal Thoughts:  No  Homicidal Thoughts:  No  Memory:  Immediate;   Fair Recent;   Fair Remote;   Fair  Judgement:  Fair  Insight:  Fair  Psychomotor Activity:  Normal  Concentration:  Concentration: Fair and Attention Span: Fair  Recall:  [employer] of Knowledge: Fair  Language: Fair  Akathisia:  No  Handed:  Right  AIMS (if indicated): done  Assets:  Communication Skills Desire for Improvement Housing Social Support  ADL's:  Intact  Cognition: WNL  Sleep:  Fair   Screenings: AIMS    Flowsheet Row Video Visit from 02/14/2022 in [facility] Office Visit from 01/16/2022 in [facility] Total Score 0 0      GAD-7    Flowsheet Row Video Visit from 04/17/2022 in [facility] Video Visit from 02/14/2022 in [facility] Video Visit from 04/26/2021 in [facility]  Total GAD-7 Score '1 1 1      '$ PHQ2-9    Flowsheet Row Video Visit from 04/17/2022 in [facility] Video Visit from 02/14/2022 in [facility] Office Visit from 01/16/2022 in [facility] Office Visit from 09/26/2021 in [facility] Office Visit from 05/09/2021 in [facility]  PHQ-2 Total Score 0 0 2 0 0  PHQ-9 Total Score -- -- 8 -- --      Flowsheet Row Video Visit from  01/23/2021 in [facility] from 12/31/2020 in [facility] Admission (Discharged) from 12/14/2020 in [facility] No  Risk No Risk No Risk        Assessment and Plan: Harleen Fineberg is a 66 year old Caucasian female, disabled, divorced, lives in [place], has a history of bipolar disorder, GAD, aortic aneurysm status post recent repair, diverticulitis, MI status post stent placement, chronic pain was evaluated by telemedicine today.  Patient is currently improving and reports back pain is also improving on the gabapentin.  Plan as noted below.  Plan GAD-stable Cymbalta 30 mg p.o. daily Hydroxyzine 25 mg as needed for severe anxiety and sleep Gabapentin 300 mg p.o. nightly. Referred for CBT-pending  Bipolar disorder depressed in remission Gabapentin 300 mg p.o. nightly  Insomnia-stable Trazodone 100 mg p.o. nightly as needed Hydroxyzine 25 mg p.o. nightly as needed Continue to need sufficient pain management  Follow-up in clinic in 5 to 6 months or sooner if needed.   Consent: Patient/Guardian gives verbal consent for treatment and assignment of benefits for services provided during this visit. Patient/Guardian expressed understanding and agreed to proceed.   This note was generated in part or whole with voice recognition software. Voice recognition is usually quite accurate but there are transcription errors that can and very often do occur. I apologize for any typographical errors that were not detected and corrected.      Ursula Alert, MD 04/17/2022, 2:31 PM

## 2022-05-11 ENCOUNTER — Telehealth: Payer: Self-pay | Admitting: *Deleted

## 2022-05-11 NOTE — Telephone Encounter (Signed)
LMTC to set up 6 month f/u Lung CA CT Scan.

## 2022-05-16 ENCOUNTER — Other Ambulatory Visit: Payer: Self-pay | Admitting: Psychiatry

## 2022-05-16 DIAGNOSIS — F3176 Bipolar disorder, in full remission, most recent episode depressed: Secondary | ICD-10-CM

## 2022-05-16 DIAGNOSIS — G4701 Insomnia due to medical condition: Secondary | ICD-10-CM

## 2022-05-16 DIAGNOSIS — F411 Generalized anxiety disorder: Secondary | ICD-10-CM

## 2022-05-18 DIAGNOSIS — I10 Essential (primary) hypertension: Secondary | ICD-10-CM | POA: Diagnosis not present

## 2022-05-18 DIAGNOSIS — E782 Mixed hyperlipidemia: Secondary | ICD-10-CM | POA: Diagnosis not present

## 2022-05-18 DIAGNOSIS — I2510 Atherosclerotic heart disease of native coronary artery without angina pectoris: Secondary | ICD-10-CM | POA: Diagnosis not present

## 2022-05-31 ENCOUNTER — Encounter: Payer: Self-pay | Admitting: Acute Care

## 2022-06-28 ENCOUNTER — Encounter: Payer: Self-pay | Admitting: Student in an Organized Health Care Education/Training Program

## 2022-06-28 ENCOUNTER — Ambulatory Visit
Payer: Medicare HMO | Attending: Student in an Organized Health Care Education/Training Program | Admitting: Student in an Organized Health Care Education/Training Program

## 2022-06-28 VITALS — BP 150/73 | HR 75 | Temp 97.0°F | Resp 16 | Ht 61.0 in | Wt 115.0 lb

## 2022-06-28 DIAGNOSIS — M12811 Other specific arthropathies, not elsewhere classified, right shoulder: Secondary | ICD-10-CM | POA: Diagnosis not present

## 2022-06-28 DIAGNOSIS — G8929 Other chronic pain: Secondary | ICD-10-CM | POA: Diagnosis not present

## 2022-06-28 DIAGNOSIS — M25551 Pain in right hip: Secondary | ICD-10-CM | POA: Insufficient documentation

## 2022-06-28 DIAGNOSIS — M5136 Other intervertebral disc degeneration, lumbar region: Secondary | ICD-10-CM | POA: Insufficient documentation

## 2022-06-28 DIAGNOSIS — I2510 Atherosclerotic heart disease of native coronary artery without angina pectoris: Secondary | ICD-10-CM | POA: Diagnosis not present

## 2022-06-28 DIAGNOSIS — M75101 Unspecified rotator cuff tear or rupture of right shoulder, not specified as traumatic: Secondary | ICD-10-CM | POA: Diagnosis not present

## 2022-06-28 DIAGNOSIS — E782 Mixed hyperlipidemia: Secondary | ICD-10-CM | POA: Diagnosis not present

## 2022-06-28 DIAGNOSIS — M25552 Pain in left hip: Secondary | ICD-10-CM | POA: Insufficient documentation

## 2022-06-28 DIAGNOSIS — M25511 Pain in right shoulder: Secondary | ICD-10-CM | POA: Diagnosis not present

## 2022-06-28 DIAGNOSIS — I10 Essential (primary) hypertension: Secondary | ICD-10-CM | POA: Diagnosis not present

## 2022-06-28 DIAGNOSIS — G894 Chronic pain syndrome: Secondary | ICD-10-CM | POA: Diagnosis not present

## 2022-06-28 DIAGNOSIS — M47816 Spondylosis without myelopathy or radiculopathy, lumbar region: Secondary | ICD-10-CM | POA: Diagnosis not present

## 2022-06-28 DIAGNOSIS — M160 Bilateral primary osteoarthritis of hip: Secondary | ICD-10-CM | POA: Diagnosis not present

## 2022-06-28 DIAGNOSIS — I340 Nonrheumatic mitral (valve) insufficiency: Secondary | ICD-10-CM | POA: Diagnosis not present

## 2022-06-28 DIAGNOSIS — I25810 Atherosclerosis of coronary artery bypass graft(s) without angina pectoris: Secondary | ICD-10-CM | POA: Diagnosis not present

## 2022-06-28 DIAGNOSIS — J449 Chronic obstructive pulmonary disease, unspecified: Secondary | ICD-10-CM | POA: Diagnosis not present

## 2022-06-28 MED ORDER — BELBUCA 450 MCG BU FILM: 1.0000 | ORAL_FILM | Freq: Two times a day (BID) | BUCCAL | 1 refills | Status: DC

## 2022-06-28 NOTE — Progress Notes (Signed)
Nursing Pain Medication Assessment:  Safety precautions to be maintained throughout the outpatient stay will include: orient to surroundings, keep bed in low position, maintain call bell within reach at all times, provide assistance with transfer out of bed and ambulation.  Medication Inspection Compliance: Pill count conducted under aseptic conditions, in front of the patient. Neither the pills nor the bottle was removed from the patient's sight at any time. Once count was completed pills were immediately returned to the patient in their original bottle.  Medication: Buprenorphine (Suboxone) Pill/Patch Count:  1 of 4 pills remain Pill/Patch Appearance: Markings consistent with prescribed medication Bottle Appearance: Standard pharmacy container. Clearly labeled. Filled Date: 07 / 19 / 2023 Last Medication intake:   last friday

## 2022-06-28 NOTE — Progress Notes (Signed)
PROVIDER NOTE: Information contained herein reflects review and annotations entered in association with encounter. Interpretation of such information and data should be left to medically-trained personnel. Information provided to patient can be located elsewhere in the medical record under "Patient Instructions". Document created using STT-dictation technology, any transcriptional errors that may result from process are unintentional.    Patient: Melissa Montes  Service Category: E/M  Provider: Gillis Santa, MD  DOB: June 04, 1956  DOS: 06/28/2022  Specialty: Interventional Pain Management  MRN: [id number]  Setting: Ambulatory outpatient  PCP: Perrin Maltese, MD  Type: Established Patient    Referring Provider: Perrin Maltese, MD  Location: Office  Delivery: Face-to-face     HPI  [name], a 66 y.o. year old female, is here today because of her Lumbar facet arthropathy [M47.816]. [name]'s primary complain today is chronic low back pain Last encounter: My last encounter with her was on 01/09/22 Pertinent problems: [name] has S/P coronary artery stent placement; Peripheral vascular disease ([place]); GAD (generalized anxiety disorder); S/P coronary artery bypass graft x 2; Osteoporosis, post-menopausal; Chronic pain syndrome; Lumbar facet arthropathy; Bilateral hip pain; Primary osteoarthritis of both hips; Chronic left shoulder pain; Right rotator cuff tear arthropathy; Left rotator cuff tear arthropathy; and Lumbar degenerative disc disease on their pertinent problem list. Pain Assessment: Severity of Chronic pain is reported as a 5 /10. Location: Back Lower/radiates into both buttocks. Onset: More than a month ago. Quality: Aching, Dull. Timing: Constant. Modifying factor(s): meds. Vitals:  height is 5' 1" (1.549 m) and weight is 115 lb (52.2 kg). Her temperature is 97 F (36.1 C) (abnormal). Her blood pressure is 150/73 (abnormal) and her pulse is 75. Her respiration is 16 and  oxygen saturation is 96%.   Reason for encounter: medication management.    Patient follows up today for medication management.  Patient is finding difficulty with patch application of buprenorphine.  She does find it beneficial but states that she has been having severe skin irritation as well as pruritus during her patch application sites.  She has tried Eucerin cream to the site without any benefit.  She is attempted to alternate sites without any benefit.  At this point, I recommend rotation to belbuca given that she is endorsing analgesic and functional benefit with buprenorphine but is not tolerating the transdermal patch secondary to skin irritation.   Pharmacotherapy Assessment   Analgesic: Transition from Butrans to belbuca as below Monitoring: [place] PMP: PDMP reviewed during this encounter.       Pharmacotherapy: No side-effects or adverse reactions reported. Compliance: No problems identified. Effectiveness: Clinically acceptable.  Dewayne Shorter, RN  06/28/2022  1:40 PM  Sign when Signing Visit Nursing Pain Medication Assessment:  Safety precautions to be maintained throughout the outpatient stay will include: orient to surroundings, keep bed in low position, maintain call bell within reach at all times, provide assistance with transfer out of bed and ambulation.  Medication Inspection Compliance: Pill count conducted under aseptic conditions, in front of the patient. Neither the pills nor the bottle was removed from the patient's sight at any time. Once count was completed pills were immediately returned to the patient in their original bottle.  Medication: Buprenorphine (Suboxone) Pill/Patch Count:  1 of 4 pills remain Pill/Patch Appearance: Markings consistent with prescribed medication Bottle Appearance: Standard pharmacy container. Clearly labeled. Filled Date: 07 / 19 / 2023 Last Medication intake:   last friday   UDS:  Summary  Date Value Ref Range  Status  02/07/2021 Note   Final    Comment:    ==================================================================== [employer] Select 13 (MW) ==================================================================== Test                             Result       Flag       Units  Drug Present and Declared for Prescription Verification   Tramadol                       >2907        EXPECTED   ng/mg creat   O-Desmethyltramadol            >2907        EXPECTED   ng/mg creat   N-Desmethyltramadol            >2907        EXPECTED   ng/mg creat    Source of tramadol is a prescription medication. O-desmethyltramadol    and N-desmethyltramadol are expected metabolites of tramadol.  ==================================================================== Test                      Result    Flag   Units      Ref Range   Creatinine              172              mg/dL      >=20 ==================================================================== Declared Medications:  The flagging and interpretation on this report are based on the  following declared medications.  Unexpected results may arise from  inaccuracies in the declared medications.   **Note: The testing scope of this panel includes these medications:   Tramadol   **Note: The testing scope of this panel does not include the  following reported medications:   Aspirin  Atorvastatin  Clopidogrel  Duloxetine  Ezetimibe  Fluticasone  Hydroxyzine  Meclizine  Metoprolol  Multivitamin  Nitroglycerin  Probiotic  Promethazine  Trazodone  Vitamin D3 ==================================================================== For clinical consultation, please call [phone]. ====================================================================      ROS  Constitutional: Denies any fever or chills Gastrointestinal: No reported hemesis, hematochezia, vomiting, or acute GI distress Musculoskeletal:  Low back, bilateral hip joint pain Neurological: No reported episodes of  acute onset apraxia, aphasia, dysarthria, agnosia, amnesia, paralysis, loss of coordination, or loss of consciousness  Medication Review  Buprenorphine HCl, DULoxetine, Probiotic Product, Vitamin D3, aspirin EC, atorvastatin, buprenorphine, clopidogrel, ezetimibe, gabapentin, hydrOXYzine, metoprolol succinate, multivitamin, nitroGLYCERIN, promethazine, traMADol HCl, and traZODone  History Review  Allergy: [name] is allergic to effexor xr [venlafaxine hcl], penicillins, amoxicillin-pot clavulanate, and keflex [cephalexin]. Drug: [name]  reports no history of drug use. Alcohol:  reports no history of alcohol use. Tobacco:  reports that she quit smoking about 3 years ago. Her smoking use included cigarettes. She has a 41.00 pack-year smoking history. She has never used smokeless tobacco. Social: [name]  reports that she quit smoking about 3 years ago. Her smoking use included cigarettes. She has a 41.00 pack-year smoking history. She has never used smokeless tobacco. She reports that she does not drink alcohol and does not use drugs. Medical:  has a past medical history of ADD (attention deficit disorder), Anxiety, Aortic aneurysm ([place]), Atherosclerosis of abdominal aorta ([place]), Bipolar disorder ([place]), Bulging of lumbar intervertebral disc, CAD (coronary artery disease), Cancer ([place]), COPD (chronic obstructive pulmonary disease) ([place]), Depression, Diverticulitis, Dyspnea,  History of kidney stones, Hyperlipidemia, Hypertension, Insomnia, Lumbar spondylosis, MI (myocardial infarction) ([place]), Migraine, Osteoporosis, PONV (postoperative nausea and vomiting), PVD (peripheral vascular disease) ([place]), 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 Surgical: [name]  has a past surgical history that includes Coronary angioplasty with stent; Abdominal hysterectomy; CAROTID ANGIOGRAPHY (N/A,  01/13/2018); Left Heart Cath (Right, 07/07/2018); Colonoscopy; iv INJECTIONS TO BACK; and ENDOVASCULAR REPAIR/STENT GRAFT (Bilateral, 12/14/2020). Family: family history includes Breast cancer in her maternal aunt; Breast cancer (age of onset: 44) in her mother; Depression in her sister.  Laboratory Chemistry Profile   Renal Lab Results  Component Value Date   BUN 18 04/28/2021   CREATININE 0.77 04/28/2021   GFRAA >60 07/17/2020   GFRNONAA >60 04/28/2021     Hepatic Lab Results  Component Value Date   AST 22 04/28/2021   ALT 16 04/28/2021   ALBUMIN 3.8 04/28/2021   ALKPHOS 119 04/28/2021     Electrolytes Lab Results  Component Value Date   NA 138 04/28/2021   K 4.0 04/28/2021   CL 100 04/28/2021   CALCIUM 9.2 04/28/2021     Bone No results found for: "VD25OH", "VD125OH2TOT", "OE3212YQ8", "GN0037CW8", "25OHVITD1", "25OHVITD2", "25OHVITD3", "TESTOFREE", "TESTOSTERONE"   Inflammation (CRP: Acute Phase) (ESR: Chronic Phase) No results found for: "CRP", "ESRSEDRATE", "LATICACIDVEN"     Note: Above Lab results reviewed.  Recent Imaging Review  VAS [place] EVAR DUPLEX ABDOMINAL AORTA STUDY  Patient Name:  ALVIS PULCINI [facility]  Date of Exam:   02/16/2022 Medical Rec #: [id number]            Accession #:    [id number] Date of Birth: 02-27-1956            Patient Gender: F Patient Age:   30 years Exam Location:  [facility] Procedure:      VAS [place] EVAR DUPLEX Referring Phys: Leotis Pain  --------------------------------------------------------------------------------   Indications: Follow up exam for EVAR.  Vascular Interventions: EVAR on 12/14/20.    Performing Technologist: Blondell Reveal RT, RDMS, RVT    Examination Guidelines: A complete evaluation includes B-mode imaging, spectral Doppler, color Doppler, and power Doppler as needed of all accessible portions of each vessel. Bilateral testing is considered an integral part of a complete examination. Limited  examinations for reoccurring indications may be performed as noted.    Endovascular Aortic Repair (EVAR): +------------+----------------+-------------------+-------------------+             Diameter AP (cm)Diameter Trans (cm)Velocities (cm/sec) +------------+----------------+-------------------+-------------------+ Aorta distal3.31            3.60               35                  +------------+----------------+-------------------+-------------------+ Right CIA   1.42            1.34               111                 +------------+----------------+-------------------+-------------------+ Left CIA    1.16            1.33               134                 +------------+----------------+-------------------+-------------------+    Summary: Abdominal Aorta: Patent endovascular aneurysm repair with no evidence of endoleak. No significant  change in the maximum distal abdominal aorta diameter when compared to the previous exams and their images with the most recent one from 08/18/21.   *See table(s) above for measurements and observations.   Electronically signed by Leotis Pain MD on 02/21/2022 at 7:36:21 AM.    Final   VAS US CAROTID Carotid Arterial Duplex Study  Patient Name:  RUKAYA KLEINSCHMIDT [facility]  Date of Exam:   02/16/2022 Medical Rec #: [id number]            Accession #:    [id number] Date of Birth: [birth year]            Patient Gender: F Patient Age:   51 years Exam Location:  [facility] Procedure:      VAS US CAROTID Referring Phys: Leotis Pain  --------------------------------------------------------------------------------   Indications: Carotid artery disease.  Performing Technologist: Blondell Reveal RT, RDMS, RVT    Examination Guidelines: A complete evaluation includes B-mode imaging, spectral Doppler, color Doppler, and power Doppler as needed of all accessible portions of each vessel. Bilateral testing is considered an integral part of a  complete examination. Limited examinations for reoccurring indications may be performed as noted.    Right Carotid Findings: +----------+--------+--------+--------+------------------+--------+           PSV cm/sEDV cm/sStenosisPlaque DescriptionComments +----------+--------+--------+--------+------------------+--------+ CCA Prox  76      17                                         +----------+--------+--------+--------+------------------+--------+ CCA Mid   59      16                                         +----------+--------+--------+--------+------------------+--------+ CCA Distal66      18                                         +----------+--------+--------+--------+------------------+--------+ ICA Prox  74      21      1-39%   heterogenous               +----------+--------+--------+--------+------------------+--------+ ICA Mid   64      26                                         +----------+--------+--------+--------+------------------+--------+ ICA Distal57      18                                         +----------+--------+--------+--------+------------------+--------+ ECA       81      11                                         +----------+--------+--------+--------+------------------+--------+  +----------+--------+-------+----------------+-------------------+           PSV cm/sEDV cmsDescribe        Arm Pressure (mmHG) +----------+--------+-------+----------------+-------------------+ [id number]  Multiphasic, WNL                    +----------+--------+-------+----------------+-------------------+  +---------+--------+--+--------+---------+ VertebralPSV cm/s41EDV cm/sAntegrade +---------+--------+--+--------+---------+    Left Carotid Findings: +----------+--------+--------+--------+------------------+--------+           PSV cm/sEDV cm/sStenosisPlaque  DescriptionComments +----------+--------+--------+--------+------------------+--------+ CCA Prox  65      17                                         +----------+--------+--------+--------+------------------+--------+ CCA Mid   65      19                                         +----------+--------+--------+--------+------------------+--------+ CCA Distal64      19                                         +----------+--------+--------+--------+------------------+--------+ ICA Prox  137     31      40-59%  heterogenous               +----------+--------+--------+--------+------------------+--------+ ICA Mid   104     27                                         +----------+--------+--------+--------+------------------+--------+ ICA Distal90      33                                         +----------+--------+--------+--------+------------------+--------+ ECA       105     15                                         +----------+--------+--------+--------+------------------+--------+  +----------+--------+--------+----------------+-------------------+           PSV cm/sEDV cm/sDescribe        Arm Pressure (mmHG) +----------+--------+--------+----------------+-------------------+ [id number]              Multiphasic, WNL                    +----------+--------+--------+----------------+-------------------+  +---------+--------+--+--------+---------+ VertebralPSV cm/s51EDV cm/sAntegrade +---------+--------+--+--------+---------+    Summary: Right Carotid: Velocities in the right ICA are consistent with a 1-39% stenosis.                The extracranial vessels were near-normal with only minimal wall                thickening or plaque.  Left Carotid: Velocities in the left ICA/bifurcation region are consistent with               a 40-59% stenosis.  No significant change in the bilateral carotid artery velocities when  compared to the previous exam on 09/15/20.  *See table(s) above for measurements and observations.    Electronically signed by Leotis Pain MD on 02/21/2022 at 7:35:54 AM.      Final    Note: Reviewed  Physical Exam  General appearance: Well nourished, well developed, and well hydrated. In no apparent acute distress Mental status: Alert, oriented x 3 (person, place, & time)       Respiratory: No evidence of acute respiratory distress Eyes: PERLA Vitals: BP (!) 150/73   Pulse 75   Temp (!) 97 F (36.1 C)   Resp 16   Ht 5' 1" (1.549 m)   Wt 115 lb (52.2 kg)   SpO2 96%   BMI 21.73 kg/m  BMI: Estimated body mass index is 21.73 kg/m as calculated from the following:   Height as of this encounter: 5' 1" (1.549 m).   Weight as of this encounter: 115 lb (52.2 kg). Ideal: Ideal body weight: 47.8 kg (105 lb 6.1 oz) Adjusted ideal body weight: 49.5 kg (109 lb 3.7 oz)   Low back pain, worse with lumbar extension. 5 out of 5 strength bilateral lower extremity: Plantar flexion, dorsiflexion, knee flexion, knee extension.  Assessment   Status Diagnosis  Controlled Controlled Controlled 1. Lumbar facet arthropathy   2. Bilateral hip pain   3. Lumbar spondylosis   4. Primary osteoarthritis of both hips   5. Chronic right shoulder pain   6. Right rotator cuff tear arthropathy   7. Lumbar degenerative disc disease   8. Chronic pain syndrome        Plan of Care   [name] has a current medication list which includes the following long-term medication(s): atorvastatin, duloxetine, ezetimibe, gabapentin, metoprolol succinate, nitroglycerin, promethazine, trazodone, and tramadol hcl.  Pharmacotherapy (Medications Ordered): Meds ordered this encounter  Medications   Buprenorphine HCl (BELBUCA) 450 MCG FILM    Sig: Place 1 Film (450 mcg total) inside cheek every 12 (twelve) hours.    Dispense:  60 each    Refill:  1    Patient having severe skin irritation  with Butrans, rotation to Belbuca  Continue Cymbalta as prescribed.  Counseled on risk of serotonin syndrome. Continue tramadol as needed for breakthrough pain.  No refills needed. Avoid NSAIDs as patient is on Plavix. Has tried and failed gabapentin and Lyrica in the past.  Follow-up plan:   Return in about 5 weeks (around 08/02/2022) for Medication Management, in person.     Status post bilateral L3, L4, L5 facet medial branch nerve block #2 3/15/202, 90% pain relief for 10-12 hrs, proceed with RFA.  Left L3, L4, L5 RFA 03/14/2020; Right L3,4,5 RFA 04/11/20.  Repeat as needed, consider sprint peripheral nerve stimulation of lumbar medial branch         Recent Visits No visits were found meeting these conditions. Showing recent visits within past 90 days and meeting all other requirements Today's Visits Date Type Provider Dept  06/28/22 Office Visit Gillis Santa, MD [facility]  Showing today's visits and meeting all other requirements Future Appointments Date Type Provider Dept  08/02/22 Appointment Gillis Santa, MD [facility]  Showing future appointments within next 90 days and meeting all other requirements  I discussed the assessment and treatment plan with the patient. The patient was provided an opportunity to ask questions and all were answered. The patient agreed with the plan and demonstrated an understanding of the instructions.  Patient advised to call back or seek an in-person evaluation if the symptoms or condition worsens.  Duration of encounter: 30 minutes.  Note by: Gillis Santa, MD Date: 06/28/2022; Time: 2:24 PM

## 2022-07-03 ENCOUNTER — Ambulatory Visit: Admission: RE | Admit: 2022-07-03 | Payer: Medicare HMO | Source: Ambulatory Visit

## 2022-07-06 ENCOUNTER — Ambulatory Visit
Admission: RE | Admit: 2022-07-06 | Discharge: 2022-07-06 | Disposition: A | Discharge status: Home / Self Care | Payer: Medicare HMO | Source: Ambulatory Visit | Attending: Acute Care | Admitting: Acute Care

## 2022-07-06 DIAGNOSIS — Z87891 Personal history of nicotine dependence: Secondary | ICD-10-CM | POA: Insufficient documentation

## 2022-07-06 DIAGNOSIS — Z0389 Encounter for observation for other suspected diseases and conditions ruled out: Secondary | ICD-10-CM | POA: Diagnosis not present

## 2022-07-11 ENCOUNTER — Other Ambulatory Visit: Payer: Self-pay

## 2022-07-11 DIAGNOSIS — Z122 Encounter for screening for malignant neoplasm of respiratory organs: Secondary | ICD-10-CM

## 2022-07-11 DIAGNOSIS — Z87891 Personal history of nicotine dependence: Secondary | ICD-10-CM

## 2022-08-02 ENCOUNTER — Ambulatory Visit
Payer: Medicare HMO | Attending: Student in an Organized Health Care Education/Training Program | Admitting: Student in an Organized Health Care Education/Training Program

## 2022-08-02 ENCOUNTER — Encounter: Payer: Self-pay | Admitting: Student in an Organized Health Care Education/Training Program

## 2022-08-02 VITALS — BP 132/93 | HR 85 | Temp 97.4°F | Resp 16 | Ht 62.0 in | Wt 115.0 lb

## 2022-08-02 DIAGNOSIS — M5136 Other intervertebral disc degeneration, lumbar region: Secondary | ICD-10-CM | POA: Insufficient documentation

## 2022-08-02 DIAGNOSIS — G894 Chronic pain syndrome: Secondary | ICD-10-CM | POA: Diagnosis not present

## 2022-08-02 DIAGNOSIS — M12811 Other specific arthropathies, not elsewhere classified, right shoulder: Secondary | ICD-10-CM | POA: Diagnosis not present

## 2022-08-02 DIAGNOSIS — M25511 Pain in right shoulder: Secondary | ICD-10-CM | POA: Insufficient documentation

## 2022-08-02 DIAGNOSIS — M75101 Unspecified rotator cuff tear or rupture of right shoulder, not specified as traumatic: Secondary | ICD-10-CM | POA: Diagnosis not present

## 2022-08-02 DIAGNOSIS — M47816 Spondylosis without myelopathy or radiculopathy, lumbar region: Secondary | ICD-10-CM | POA: Diagnosis not present

## 2022-08-02 DIAGNOSIS — G8929 Other chronic pain: Secondary | ICD-10-CM | POA: Diagnosis not present

## 2022-08-02 DIAGNOSIS — M160 Bilateral primary osteoarthritis of hip: Secondary | ICD-10-CM | POA: Insufficient documentation

## 2022-08-02 DIAGNOSIS — M25551 Pain in right hip: Secondary | ICD-10-CM | POA: Diagnosis not present

## 2022-08-02 DIAGNOSIS — M25552 Pain in left hip: Secondary | ICD-10-CM | POA: Insufficient documentation

## 2022-08-02 MED ORDER — BELBUCA 450 MCG BU FILM: 1.0000 | ORAL_FILM | Freq: Two times a day (BID) | BUCCAL | 0 refills | Status: DC

## 2022-08-02 NOTE — Progress Notes (Signed)
Nursing Pain Medication Assessment:  Safety precautions to be maintained throughout the outpatient stay will include: orient to surroundings, keep bed in low position, maintain call bell within reach at all times, provide assistance with transfer out of bed and ambulation.  Medication Inspection Compliance: Pill count conducted under aseptic conditions, in front of the patient. Neither the pills nor the bottle was removed from the patient's sight at any time. Once count was completed pills were immediately returned to the patient in their original bottle.  Medication:  belbuca Pill/Patch Count:  6 of 60 pills remain Pill/Patch Appearance: Markings consistent with prescribed medication Bottle Appearance: Standard pharmacy container. Clearly labeled. Filled Date: 08 / 15 / 2023 Last Medication intake:  Today

## 2022-08-02 NOTE — Progress Notes (Signed)
PROVIDER NOTE: Information contained herein reflects review and annotations entered in association with encounter. Interpretation of such information and data should be left to medically-trained personnel. Information provided to patient can be located elsewhere in the medical record under "Patient Instructions". Document created using STT-dictation technology, any transcriptional errors that may result from process are unintentional.    Patient: Melissa Montes  Service Category: E/M  Provider: Gillis Santa, MD  DOB: 02-12-1956  DOS: 08/02/2022  Specialty: Interventional Pain Management  MRN: [id number]  Setting: Ambulatory outpatient  PCP: Perrin Maltese, MD  Type: Established Patient    Referring Provider: Perrin Maltese, MD  Location: Office  Delivery: Face-to-face     HPI  Melissa Montes, a 66 y.o. year old female, is here today because of her Lumbar facet arthropathy [M47.816]. Melissa Montes's primary complain today is chronic low back pain Last encounter: My last encounter with her was on 06/28/22 Pertinent problems: Melissa Montes has S/P coronary artery stent placement; Peripheral vascular disease ([place]); GAD (generalized anxiety disorder); S/P coronary artery bypass graft x 2; Osteoporosis, post-menopausal; Chronic pain syndrome; Lumbar facet arthropathy; Bilateral hip pain; Primary osteoarthritis of both hips; Chronic left shoulder pain; Right rotator cuff tear arthropathy; Left rotator cuff tear arthropathy; and Lumbar degenerative disc disease on their pertinent problem list. Pain Assessment: Severity of Chronic pain is reported as a 3 /10. Location: Back Lower/radiates into both butocks. Onset: More than a month ago. Quality: Aching, Sharp. Timing: Constant. Modifying factor(s): meds. Vitals:  height is '5\' 2"'  (1.575 m) and weight is 115 lb (52.2 kg). Her temperature is 97.4 F (36.3 C) (abnormal). Her blood pressure is 132/93 (abnormal) and her pulse is 85. Her respiration is 16 and  oxygen saturation is 96%.   Reason for encounter: medication management.    Patient follows up today for medication management.  She is getting really good analgesic benefit with belbuca.  She states that she has felt better than she has in the last 4 to 5 years.  She states that it helps her function better as well.  She states that she is considering having a dental extraction at the end of October.  I informed her that if she does have this done that we will need to figure out a periprocedure pain management plan.  More than likely I will have her discontinue her belbuca and we may need to cover her acute on chronic pain with a short acting opioid analgesics until she has recovered from her dental extraction at which point we will transition her back to her previous dose of belbuca.  Patient endorsed understanding.  She states that she is not sure yet if she is going to proceed with a dental extraction but if she does she will contact our clinic to make an appointment to form a perioperative pain plan.   Pharmacotherapy Assessment   Analgesic: Belbuca 450 mcg twice daily Monitoring: [facility] PMP: PDMP not reviewed this encounter.       Pharmacotherapy: No side-effects or adverse reactions reported. Compliance: No problems identified. Effectiveness: Clinically acceptable.  Dewayne Shorter, RN  08/02/2022  2:08 PM  Sign when Signing Visit Nursing Pain Medication Assessment:  Safety precautions to be maintained throughout the outpatient stay will include: orient to surroundings, keep bed in low position, maintain call bell within reach at all times, provide assistance with transfer out of bed and ambulation.  Medication Inspection Compliance: Pill count conducted under aseptic conditions, in front of the patient.  Neither the pills nor the bottle was removed from the patient's sight at any time. Once count was completed pills were immediately returned to the patient in their original bottle.  Medication:   belbuca Pill/Patch Count:  6 of 60 pills remain Pill/Patch Appearance: Markings consistent with prescribed medication Bottle Appearance: Standard pharmacy container. Clearly labeled. Filled Date: 08 / 15 / 2023 Last Medication intake:  Today   UDS:  Summary  Date Value Ref Range Status  02/07/2021 Note  Final    Comment:    ==================================================================== [employer] Select 13 (MW) ==================================================================== Test                             Result       Flag       Units  Drug Present and Declared for Prescription Verification   Tramadol                       >2907        EXPECTED   ng/mg creat   O-Desmethyltramadol            >2907        EXPECTED   ng/mg creat   N-Desmethyltramadol            >2907        EXPECTED   ng/mg creat    Source of tramadol is a prescription medication. O-desmethyltramadol    and N-desmethyltramadol are expected metabolites of tramadol.  ==================================================================== Test                      Result    Flag   Units      Ref Range   Creatinine              172              mg/dL      >=20 ==================================================================== Declared Medications:  The flagging and interpretation on this report are based on the  following declared medications.  Unexpected results may arise from  inaccuracies in the declared medications.   **Note: The testing scope of this panel includes these medications:   Tramadol   **Note: The testing scope of this panel does not include the  following reported medications:   Aspirin  Atorvastatin  Clopidogrel  Duloxetine  Ezetimibe  Fluticasone  Hydroxyzine  Meclizine  Metoprolol  Multivitamin  Nitroglycerin  Probiotic  Promethazine  Trazodone  Vitamin D3 ==================================================================== For clinical consultation, please call [phone]. ====================================================================      ROS  Constitutional: Denies any fever or chills Gastrointestinal: No reported hemesis, hematochezia, vomiting, or acute GI distress Musculoskeletal:  Low back, bilateral hip joint pain Neurological: No reported episodes of acute onset apraxia, aphasia, dysarthria, agnosia, amnesia, paralysis, loss of coordination, or loss of consciousness  Medication Review  Buprenorphine HCl, DULoxetine, Probiotic Product, Vitamin D3, aspirin EC, atorvastatin, clopidogrel, ezetimibe, gabapentin, hydrOXYzine, metoprolol succinate, multivitamin, nitroGLYCERIN, promethazine, traMADol HCl, and traZODone  History Review  Allergy: Melissa Montes is allergic to effexor xr [venlafaxine hcl], penicillins, amoxicillin-pot clavulanate, and keflex [cephalexin]. Drug: Melissa Montes  reports no history of drug use. Alcohol:  reports no history of alcohol use. Tobacco:  reports that she quit smoking about 3 years ago. Her smoking use included cigarettes. She has a 41.00 pack-year smoking history. She has never used smokeless tobacco. Social: Melissa Montes  reports that she quit smoking about 3  years ago. Her smoking use included cigarettes. She has a 41.00 pack-year smoking history. She has never used smokeless tobacco. She reports that she does not drink alcohol and does not use drugs. Medical:  has a past medical history of ADD (attention deficit disorder), Anxiety, Aortic aneurysm ([place]), Atherosclerosis of abdominal aorta ([place]), Bipolar disorder ([place]), Bulging of lumbar intervertebral disc, CAD (coronary artery disease), Cancer ([place]), COPD (chronic obstructive pulmonary disease) ([place]), Depression, Diverticulitis, Dyspnea, History of kidney stones, Hyperlipidemia, Hypertension, Insomnia, Lumbar spondylosis, MI (myocardial infarction) ([place]), Migraine, Osteoporosis, PONV (postoperative nausea and vomiting), PVD (peripheral vascular disease) ([place]),  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 Surgical: Melissa Montes  has a past surgical history that includes Coronary angioplasty with stent; Abdominal hysterectomy; CAROTID ANGIOGRAPHY (N/A, 01/13/2018); Left Heart Cath (Right, 07/07/2018); Colonoscopy; iv INJECTIONS TO BACK; and ENDOVASCULAR REPAIR/STENT GRAFT (Bilateral, 12/14/2020). Family: family history includes Breast cancer in her maternal aunt; Breast cancer (age of onset: 13) in her mother; Depression in her sister.  Laboratory Chemistry Profile   Renal Lab Results  Component Value Date   BUN 18 04/28/2021   CREATININE 0.77 04/28/2021   GFRAA >60 07/17/2020   GFRNONAA >60 04/28/2021     Hepatic Lab Results  Component Value Date   AST 22 04/28/2021   ALT 16 04/28/2021   ALBUMIN 3.8 04/28/2021   ALKPHOS 119 04/28/2021     Electrolytes Lab Results  Component Value Date   NA 138 04/28/2021   K 4.0 04/28/2021   CL 100 04/28/2021   CALCIUM 9.2 04/28/2021     Bone No results found for: "VD25OH", "VD125OH2TOT", "DV7616WV3", "XT0626RS8", "25OHVITD1", "25OHVITD2", "25OHVITD3", "TESTOFREE", "TESTOSTERONE"   Inflammation (CRP: Acute Phase) (ESR: Chronic Phase) No results found for: "CRP", "ESRSEDRATE", "LATICACIDVEN"     Note: Above Lab results reviewed.  Recent Imaging Review  CT CHEST LCS NODULE F/U W/O CONTRAST CLINICAL DATA:  Former smoker, quit 2019, 41 pack-year history.  EXAM: CT CHEST WITHOUT CONTRAST FOR LUNG CANCER SCREENING NODULE FOLLOW-UP  TECHNIQUE: Multidetector CT imaging of the chest was performed following the standard protocol without IV contrast.  RADIATION DOSE REDUCTION: This exam was performed according to the departmental dose-optimization program which includes automated exposure control, adjustment of the mA and/or kV according to patient size and/or use of iterative reconstruction  technique.  COMPARISON:  09/21/2021.  FINDINGS: Cardiovascular: Atherosclerotic calcification of the aorta and aortic valve. Heart size normal. No pericardial effusion.  Mediastinum/Nodes: No pathologically enlarged mediastinal or axillary lymph nodes. Hilar regions are difficult to definitively evaluate without IV contrast. Esophagus is grossly unremarkable.  Lungs/Pleura: Bullous paraseptal and centrilobular emphysema. Subpleural atelectasis in the posteromedial left lower lobe (3/207-219). Pulmonary nodules measure 5.0 mm or less in size. No suspicious pulmonary nodules. No pleural fluid. Minimal adherent debris in the airway.  Upper Abdomen: 12 mm fluid density lesion in the right hepatic lobe, unchanged and indicative of a cyst. No follow-up necessary. Stone in the gallbladder. Visualized portions of the adrenal glands and right kidney are unremarkable. Tiny stone in the left kidney. Visualized portions of the spleen, pancreas, stomach and bowel are grossly unremarkable.  Musculoskeletal: Degenerative changes in the spine. No worrisome lytic or sclerotic lesions.  IMPRESSION: 1. Lung-RADS 2, benign appearance or behavior. Continue annual screening with low-dose chest CT without contrast in 12 months. 2. Cholelithiasis. 3. Left renal stone. 4.  Aortic atherosclerosis (ICD10-I70.0). 5.  Emphysema (ICD10-J43.9).  Electronically Signed   By:  Lorin Picket M.D.   On: 07/10/2022 08:55  Note: Reviewed        Physical Exam  General appearance: Well nourished, well developed, and well hydrated. In no apparent acute distress Mental status: Alert, oriented x 3 (person, place, & time)       Respiratory: No evidence of acute respiratory distress Eyes: PERLA Vitals: BP (!) 132/93   Pulse 85   Temp (!) 97.4 F (36.3 C)   Resp 16   Ht '5\' 2"'  (1.575 m)   Wt 115 lb (52.2 kg)   SpO2 96%   BMI 21.03 kg/m  BMI: Estimated body mass index is 21.03 kg/m as calculated from the  following:   Height as of this encounter: '5\' 2"'  (1.575 m).   Weight as of this encounter: 115 lb (52.2 kg). Ideal: Ideal body weight: 50.1 kg (110 lb 7.2 oz) Adjusted ideal body weight: 50.9 kg (112 lb 4.3 oz)   Low back pain, worse with lumbar extension. 5 out of 5 strength bilateral lower extremity: Plantar flexion, dorsiflexion, knee flexion, knee extension.  Assessment   Status Diagnosis  Controlled Controlled Controlled 1. Lumbar facet arthropathy   2. Bilateral hip pain   3. Lumbar spondylosis   4. Primary osteoarthritis of both hips   5. Chronic right shoulder pain   6. Right rotator cuff tear arthropathy   7. Lumbar degenerative disc disease   8. Chronic pain syndrome        Plan of Care   Melissa Montes has a current medication list which includes the following long-term medication(s): atorvastatin, duloxetine, ezetimibe, metoprolol succinate, nitroglycerin, promethazine, trazodone, gabapentin, and tramadol hcl.  Pharmacotherapy (Medications Ordered): Meds ordered this encounter  Medications   Buprenorphine HCl (BELBUCA) 450 MCG FILM    Sig: Place 1 Film (450 mcg total) inside cheek every 12 (twelve) hours.    Dispense:  60 each    Refill:  0    Patient having severe skin irritation with [place], rotation to Belbuca  Continue Cymbalta as prescribed.  Counseled on risk of serotonin syndrome. Avoid NSAIDs as patient is on Plavix. Has tried and failed gabapentin and Lyrica in the past.  Follow-up plan:   Return in about 8 weeks (around 09/27/2022) for Medication Management, in person.     Status post bilateral L3, L4, L5 facet medial branch nerve block #2 3/15/202, 90% pain relief for 10-12 hrs, proceed with RFA.  Left L3, L4, L5 RFA 03/14/2020; Right L3,4,5 RFA 04/11/20.  Repeat as needed, consider sprint peripheral nerve stimulation of lumbar medial branch         Recent Visits Date Type Provider Dept  06/28/22 Office Visit Gillis Santa, MD [facility]  Showing recent visits within past 90 days and meeting all other requirements Today's Visits Date Type Provider Dept  08/02/22 Office Visit Gillis Santa, MD [facility]  Showing today's visits and meeting all other requirements Future Appointments Date Type Provider Dept  09/25/22 Appointment Gillis Santa, MD [facility]  Showing future appointments within next 90 days and meeting all other requirements  I discussed the assessment and treatment plan with the patient. The patient was provided an opportunity to ask questions and all were answered. The patient agreed with the plan and demonstrated an understanding of the instructions.  Patient advised to call back or seek an in-person evaluation if the symptoms or condition worsens.  Duration of encounter: 30 minutes.  Note by: Gillis Santa, MD Date: 08/02/2022;  Time: 3:08 PM

## 2022-08-21 ENCOUNTER — Ambulatory Visit
Payer: Medicare HMO | Attending: Student in an Organized Health Care Education/Training Program | Admitting: Student in an Organized Health Care Education/Training Program

## 2022-08-21 ENCOUNTER — Encounter: Payer: Self-pay | Admitting: Student in an Organized Health Care Education/Training Program

## 2022-08-21 VITALS — BP 97/67 | HR 83 | Temp 96.9°F | Ht 62.0 in | Wt 118.0 lb

## 2022-08-21 DIAGNOSIS — G894 Chronic pain syndrome: Secondary | ICD-10-CM | POA: Insufficient documentation

## 2022-08-21 DIAGNOSIS — M75101 Unspecified rotator cuff tear or rupture of right shoulder, not specified as traumatic: Secondary | ICD-10-CM | POA: Insufficient documentation

## 2022-08-21 DIAGNOSIS — M160 Bilateral primary osteoarthritis of hip: Secondary | ICD-10-CM | POA: Insufficient documentation

## 2022-08-21 DIAGNOSIS — M12811 Other specific arthropathies, not elsewhere classified, right shoulder: Secondary | ICD-10-CM | POA: Diagnosis not present

## 2022-08-21 DIAGNOSIS — M47816 Spondylosis without myelopathy or radiculopathy, lumbar region: Secondary | ICD-10-CM | POA: Insufficient documentation

## 2022-08-21 MED ORDER — HYDROCODONE-ACETAMINOPHEN 5-325 MG PO TABS: 1.0000 | ORAL_TABLET | Freq: Three times a day (TID) | ORAL | 0 refills | Status: DC | PRN

## 2022-08-21 NOTE — Patient Instructions (Signed)
Please provide pt with surgeons letters

## 2022-08-21 NOTE — Progress Notes (Signed)
PROVIDER NOTE: Information contained herein reflects review and annotations entered in association with encounter. Interpretation of such information and data should be left to medically-trained personnel. Information provided to patient can be located elsewhere in the medical record under "Patient Instructions". Document created using STT-dictation technology, any transcriptional errors that may result from process are unintentional.    Patient: Melissa Montes  Service Category: E/M  Provider: Gillis Santa, MD  DOB: [birth year]  DOS: 08/21/2022  Referring Provider: Perrin Maltese, MD  MRN: [id number]  Specialty: Interventional Pain Management  PCP: Perrin Maltese, MD  Type: Established Patient  Setting: Ambulatory outpatient    Location: Office  Delivery: Face-to-face     HPI  [name], a 66 y.o. year old female, is here today because of her Lumbar facet arthropathy [M47.816]. Ms. Hoelzel's primary complain today is Back Pain (lower) Last encounter: My last encounter with her was on 08/02/2022. Pertinent problems: Ms. Mellott has S/P coronary artery stent placement; Peripheral vascular disease ([place]); GAD (generalized anxiety disorder); S/P coronary artery bypass graft x 2; Osteoporosis, post-menopausal; Chronic pain syndrome; Lumbar facet arthropathy; Bilateral hip pain; Primary osteoarthritis of both hips; Chronic left shoulder pain; Right rotator cuff tear arthropathy; Left rotator cuff tear arthropathy; and Lumbar degenerative disc disease on their pertinent problem list. Pain Assessment: Severity of Chronic pain is reported as a 2 /10. Location: Back (buttock)  /pain radiaties down to her buttock. Onset: 1 to 4 weeks ago. Quality: Radiating, Pressure, Throbbing, Aching, Constant, Burning. Timing: Constant. Modifying factor(s): nolonged standing, meds. Vitals:  height is 5' 2" (1.575 m) and weight is 118 lb (53.5 kg). Her temperature is 96.9 F (36.1 C) (abnormal). Her blood  pressure is 97/67 and her pulse is 83. Her oxygen saturation is 95%.   Reason for encounter: medication management.  Patient presents today for medication management.  She has upcoming dental extraction at the end of this month.  We have discussed that if she were to move forward with this that we would need to discontinue her belbuca and place her on a short acting opioid analgesic around the time of her dental procedure.  I have instructed that she discontinue her belbuca Friday, October 13 which will be 2 weeks from her planned dental extraction date.  I provided her with hydrocodone that she can take every 8 hours as needed to help manage her pain.  I have also provided her with a surgeons letter indicating that her dentist can prescribe what ever here she sees appropriate for postoperative pain even if it is an opioid.  Patient endorsed understanding.  Pharmacotherapy Assessment  Analgesic: Discontinue belbuca 2 weeks prior to dental extraction, start hydrocodone as below.  We will transition her back to belbuca after she recovers from her dental extraction.  Monitoring: [place] PMP: PDMP reviewed during this encounter.       Pharmacotherapy: No side-effects or adverse reactions reported. Compliance: No problems identified. Effectiveness: Clinically acceptable.  Chauncey Fischer, RN  08/21/2022  1:11 PM  Sign when Signing Visit Nursing Pain Medication Assessment:  Safety precautions to be maintained throughout the outpatient stay will include: orient to surroundings, keep bed in low position, maintain call bell within reach at all times, provide assistance with transfer out of bed and ambulation.  Medication Inspection Compliance: Pill count conducted under aseptic conditions, in front of the patient. Neither the pills nor the bottle was removed from the patient's sight at any time. Once count was completed pills were  immediately returned to the patient in their original bottle.  Medication:  Buprenorphine (Suboxone) Pill/Patch Count:  47 of 60 pills remain Pill/Patch Appearance: Markings consistent with prescribed medication Bottle Appearance: Standard pharmacy container. Clearly labeled. Filled Date: 8 / 29 / 2023 Last Medication intake:  TodaySafety precautions to be maintained throughout the outpatient stay will include: orient to surroundings, keep bed in low position, maintain call bell within reach at all times, provide assistance with transfer out of bed and ambulation.     No results found for: "CBDTHCR" No results found for: "D8THCCBX" No results found for: "D9THCCBX"  UDS:  Summary  Date Value Ref Range Status  02/07/2021 Note  Final    Comment:    ==================================================================== [employer] [facility] 13 (MW) ==================================================================== Test                             Result       Flag       Units  Drug Present and Declared for Prescription Verification   Tramadol                       >2907        EXPECTED   ng/mg creat   O-Desmethyltramadol            >2907        EXPECTED   ng/mg creat   N-Desmethyltramadol            >2907        EXPECTED   ng/mg creat    Source of tramadol is a prescription medication. O-desmethyltramadol    and N-desmethyltramadol are expected metabolites of tramadol.  ==================================================================== Test                      Result    Flag   Units      Ref Range   Creatinine              172              mg/dL      >=20 ==================================================================== Declared Medications:  The flagging and interpretation on this report are based on the  following declared medications.  Unexpected results may arise from  inaccuracies in the declared medications.   **Note: The testing scope of this panel includes these medications:   Tramadol   **Note: The testing scope of this panel does not include  the  following reported medications:   Aspirin  Atorvastatin  Clopidogrel  Duloxetine  Ezetimibe  Fluticasone  Hydroxyzine  Meclizine  Metoprolol  Multivitamin  Nitroglycerin  Probiotic  Promethazine  Trazodone  Vitamin D3 ==================================================================== For clinical consultation, please call [phone]. ====================================================================       ROS  Constitutional: Denies any fever or chills Gastrointestinal: No reported hemesis, hematochezia, vomiting, or acute GI distress Musculoskeletal: Denies any acute onset joint swelling, redness, loss of ROM, or weakness Neurological: No reported episodes of acute onset apraxia, aphasia, dysarthria, agnosia, amnesia, paralysis, loss of coordination, or loss of consciousness  Medication Review  Buprenorphine HCl, DULoxetine, HYDROcodone-acetaminophen, Probiotic Product, Vitamin D3, aspirin EC, atorvastatin, clopidogrel, ezetimibe, hydrOXYzine, metoprolol succinate, multivitamin, nitroGLYCERIN, promethazine, and traZODone  History Review  Allergy: Ms. Weems is allergic to effexor xr [venlafaxine hcl], penicillins, amoxicillin-pot clavulanate, and keflex [cephalexin]. Drug: Ms. Effertz  reports no history of drug use. Alcohol:  reports no history of alcohol use. Tobacco:  reports that  she quit smoking about 3 years ago. Her smoking use included cigarettes. She has a 41.00 pack-year smoking history. She has never used smokeless tobacco. Social: Ms. Baldridge  reports that she quit smoking about 3 years ago. Her smoking use included cigarettes. She has a 41.00 pack-year smoking history. She has never used smokeless tobacco. She reports that she does not drink alcohol and does not use drugs. Medical:  has a past medical history of ADD (attention deficit disorder), Anxiety, Aortic aneurysm ([place]), Atherosclerosis of abdominal aorta ([place]), Bipolar disorder ([place]),  Bulging of lumbar intervertebral disc, CAD (coronary artery disease), Cancer ([place]), COPD (chronic obstructive pulmonary disease) ([place]), Depression, Diverticulitis, Dyspnea, History of kidney stones, Hyperlipidemia, Hypertension, Insomnia, Lumbar spondylosis, MI (myocardial infarction) ([place]), Migraine, Osteoporosis, PONV (postoperative nausea and vomiting), PVD (peripheral vascular disease) ([place]), 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 Surgical: Ms. Currington  has a past surgical history that includes Coronary angioplasty with stent; Abdominal hysterectomy; CAROTID ANGIOGRAPHY (N/A, 01/13/2018); Left Heart Cath (Right, 07/07/2018); Colonoscopy; iv INJECTIONS TO BACK; and ENDOVASCULAR REPAIR/STENT GRAFT (Bilateral, 12/14/2020). Family: family history includes Breast cancer in her maternal aunt; Breast cancer (age of onset: 28) in her mother; Depression in her sister.  Laboratory Chemistry Profile   Renal Lab Results  Component Value Date   BUN 18 04/28/2021   CREATININE 0.77 04/28/2021   GFRAA >60 07/17/2020   GFRNONAA >60 04/28/2021    Hepatic Lab Results  Component Value Date   AST 22 04/28/2021   ALT 16 04/28/2021   ALBUMIN 3.8 04/28/2021   ALKPHOS 119 04/28/2021    Electrolytes Lab Results  Component Value Date   NA 138 04/28/2021   K 4.0 04/28/2021   CL 100 04/28/2021   CALCIUM 9.2 04/28/2021    Bone No results found for: "VD25OH", "VD125OH2TOT", "YK5993TT0", "VX7939QZ0", "25OHVITD1", "25OHVITD2", "25OHVITD3", "TESTOFREE", "TESTOSTERONE"  Inflammation (CRP: Acute Phase) (ESR: Chronic Phase) No results found for: "CRP", "ESRSEDRATE", "LATICACIDVEN"       Note: Above Lab results reviewed.  Recent Imaging Review  CT CHEST LCS NODULE F/U W/O CONTRAST CLINICAL DATA:  Former smoker, quit 2019, 41 pack-year history.  EXAM: CT CHEST WITHOUT CONTRAST FOR LUNG CANCER SCREENING  NODULE FOLLOW-UP  TECHNIQUE: Multidetector CT imaging of the chest was performed following the standard protocol without IV contrast.  RADIATION DOSE REDUCTION: This exam was performed according to the departmental dose-optimization program which includes automated exposure control, adjustment of the mA and/or kV according to patient size and/or use of iterative reconstruction technique.  COMPARISON:  09/21/2021.  FINDINGS: Cardiovascular: Atherosclerotic calcification of the aorta and aortic valve. Heart size normal. No pericardial effusion.  Mediastinum/Nodes: No pathologically enlarged mediastinal or axillary lymph nodes. Hilar regions are difficult to definitively evaluate without IV contrast. Esophagus is grossly unremarkable.  Lungs/Pleura: Bullous paraseptal and centrilobular emphysema. Subpleural atelectasis in the posteromedial left lower lobe (3/207-219). Pulmonary nodules measure 5.0 mm or less in size. No suspicious pulmonary nodules. No pleural fluid. Minimal adherent debris in the airway.  Upper Abdomen: 12 mm fluid density lesion in the right hepatic lobe, unchanged and indicative of a cyst. No follow-up necessary. Stone in the gallbladder. Visualized portions of the adrenal glands and right kidney are unremarkable. Tiny stone in the left kidney. Visualized portions of the spleen, pancreas, stomach and bowel are grossly unremarkable.  Musculoskeletal: Degenerative changes in the spine. No worrisome lytic or sclerotic lesions.  IMPRESSION: 1. Lung-RADS 2, benign appearance or behavior.  Continue annual screening with low-dose chest CT without contrast in 12 months. 2. Cholelithiasis. 3. Left renal stone. 4.  Aortic atherosclerosis (ICD10-I70.0). 5.  Emphysema (ICD10-J43.9).  Electronically Signed   By: Lorin Picket M.D.   On: 07/10/2022 08:55 Note: Reviewed        Physical Exam  General appearance: Well nourished, well developed, and well hydrated.  In no apparent acute distress Mental status: Alert, oriented x 3 (person, place, & time)       Respiratory: No evidence of acute respiratory distress Eyes: PERLA Vitals: BP 97/67   Pulse 83   Temp (!) 96.9 F (36.1 C)   Ht 5' 2" (1.575 m)   Wt 118 lb (53.5 kg)   SpO2 95%   BMI 21.58 kg/m  BMI: Estimated body mass index is 21.58 kg/m as calculated from the following:   Height as of this encounter: 5' 2" (1.575 m).   Weight as of this encounter: 118 lb (53.5 kg). Ideal: Ideal body weight: 50.1 kg (110 lb 7.2 oz) Adjusted ideal body weight: 51.5 kg (113 lb 7.5 oz)  Assessment   Diagnosis Status  1. Lumbar facet arthropathy   2. Lumbar spondylosis   3. Primary osteoarthritis of both hips   4. Right rotator cuff tear arthropathy   5. Chronic pain syndrome    Controlled Controlled Controlled      Plan of Care   Ms. Jacquette Canales Quist has a current medication list which includes the following long-term medication(s): atorvastatin, duloxetine, ezetimibe, metoprolol succinate, nitroglycerin, promethazine, and trazodone.  Discontinue belbuca Friday, October 13.  Start hydrocodone as below prior to dental surgery.  I will have patient follow-up with me in in early November as scheduled to likely wean hydrocodone and restart belbuca.  Pharmacotherapy (Medications Ordered): Meds ordered this encounter  Medications   HYDROcodone-acetaminophen (NORCO/VICODIN) 5-325 MG tablet    Sig: Take 1 tablet by mouth every 8 (eight) hours as needed for severe pain. Must last 30 days.    Dispense:  90 tablet    Refill:  0    Chronic Pain: STOP Act (Not applicable) Fill 1 day early if closed on refill date. Avoid benzodiazepines within 8 hours of opioids   Orders:  No orders of the defined types were placed in this encounter.  Follow-up plan:   Return in about 4 weeks (around 09/18/2022) for Keep sch. appt.    Recent Visits Date Type Provider Dept  08/02/22 Office Visit Gillis Santa,  MD [facility]  06/28/22 Office Visit Gillis Santa, MD [facility]  Showing recent visits within past 90 days and meeting all other requirements Today's Visits Date Type Provider Dept  08/21/22 Office Visit Gillis Santa, MD [facility]  Showing today's visits and meeting all other requirements Future Appointments Date Type Provider Dept  09/25/22 Appointment Gillis Santa, MD [facility]  Showing future appointments within next 90 days and meeting all other requirements  I discussed the assessment and treatment plan with the patient. The patient was provided an opportunity to ask questions and all were answered. The patient agreed with the plan and demonstrated an understanding of the instructions.  Patient advised to call back or seek an in-person evaluation if the symptoms or condition worsens.  Duration of encounter: 4mnutes.  Total time on encounter, as per AMA guidelines included both the face-to-face and non-face-to-face time personally spent by the physician and/or other qualified health care professional(s) on the day of the encounter (includes time  in activities that require the physician or other qualified health care professional and does not include time in activities normally performed by clinical staff). Physician's time may include the following activities when performed: preparing to see the patient (eg, review of tests, pre-charting review of records) obtaining and/or reviewing separately obtained history performing a medically appropriate examination and/or evaluation counseling and educating the patient/family/caregiver ordering medications, tests, or procedures referring and communicating with other health care professionals (when not separately reported) documenting clinical information in the electronic or other health record independently interpreting results (not separately reported) and communicating results to the patient/  family/caregiver care coordination (not separately reported)  Note by: Gillis Santa, MD Date: 08/21/2022; Time: 1:27 PM

## 2022-08-21 NOTE — Progress Notes (Signed)
Nursing Pain Medication Assessment:  Safety precautions to be maintained throughout the outpatient stay will include: orient to surroundings, keep bed in low position, maintain call bell within reach at all times, provide assistance with transfer out of bed and ambulation.  Medication Inspection Compliance: Pill count conducted under aseptic conditions, in front of the patient. Neither the pills nor the bottle was removed from the patient's sight at any time. Once count was completed pills were immediately returned to the patient in their original bottle.  Medication: Buprenorphine (Suboxone) Pill/Patch Count:  47 of 60 pills remain Pill/Patch Appearance: Markings consistent with prescribed medication Bottle Appearance: Standard pharmacy container. Clearly labeled. Filled Date: 8 / 75 / 2023 Last Medication intake:  TodaySafety precautions to be maintained throughout the outpatient stay will include: orient to surroundings, keep bed in low position, maintain call bell within reach at all times, provide assistance with transfer out of bed and ambulation.

## 2022-08-29 ENCOUNTER — Telehealth: Payer: Self-pay | Admitting: *Deleted

## 2022-08-29 ENCOUNTER — Encounter: Payer: Self-pay | Admitting: Student in an Organized Health Care Education/Training Program

## 2022-08-29 NOTE — Telephone Encounter (Signed)
Patient called and per Dr. Holley Raring  1) ok to discontinue Belbuca  2) may resume Tramadol as directed.   3) Patient states she has enough to last her until her next appointment on 11/25/2021  4) Pharmacy called and Hydrocodone script discontinued as ordered. Patient states she is not going to fill it.

## 2022-09-05 ENCOUNTER — Encounter: Payer: Self-pay | Admitting: Psychiatry

## 2022-09-05 ENCOUNTER — Ambulatory Visit (INDEPENDENT_AMBULATORY_CARE_PROVIDER_SITE_OTHER): Payer: Medicare HMO | Admitting: Psychiatry

## 2022-09-05 VITALS — BP 89/60 | HR 105 | Temp 97.8°F | Ht 62.0 in | Wt 120.6 lb

## 2022-09-05 DIAGNOSIS — F3176 Bipolar disorder, in full remission, most recent episode depressed: Secondary | ICD-10-CM

## 2022-09-05 DIAGNOSIS — G4701 Insomnia due to medical condition: Secondary | ICD-10-CM | POA: Diagnosis not present

## 2022-09-05 DIAGNOSIS — F411 Generalized anxiety disorder: Secondary | ICD-10-CM

## 2022-09-05 DIAGNOSIS — R69 Illness, unspecified: Secondary | ICD-10-CM | POA: Diagnosis not present

## 2022-09-05 MED ORDER — TRAZODONE HCL 100 MG PO TABS: 100.0000 mg | ORAL_TABLET | Freq: Every day | ORAL | 1 refills | Status: DC

## 2022-09-05 MED ORDER — GABAPENTIN 300 MG PO CAPS: 300.0000 mg | ORAL_CAPSULE | Freq: Every day | ORAL | 1 refills | Status: DC

## 2022-09-05 MED ORDER — DULOXETINE HCL 30 MG PO CPEP: ORAL_CAPSULE | ORAL | 1 refills | Status: DC

## 2022-09-05 MED ORDER — HYDROXYZINE PAMOATE 25 MG PO CAPS: ORAL_CAPSULE | ORAL | 1 refills | Status: DC

## 2022-09-05 NOTE — Progress Notes (Unsigned)
[facility] MD OP Progress Note  09/05/2022 3:20 PM Melissa Montes  MRN:  [id number]  Chief Complaint:  Chief Complaint  Patient presents with   Follow-up   Anxiety   Depression   HPI: Melissa Montes is a 66 year old Caucasian female on disability, divorced, lives in [place], has a history of bipolar disorder, GAD, diverticulitis, abdominal aortic aneurysm, chronic pain was evaluated in office today.  Patient today reports she is currently doing well.  Denies any anxiety symptoms.  Denies any sadness, low motivation or low energy.  Patient reports sleep is overall good.  Does take the trazodone as needed only.  Patient reports she is compliant on the gabapentin, Cymbalta.  Denies side effects.  Patient reports she continues to be in pain, chronic back pain, currently taking tramadol.  She was prescribed Belbuca however she could not tolerate it because of her dental issues.  Hence stopped taking it.  Patient with low blood pressure reading today reports she normally has blood pressure around this range.  She does feel lightheaded when she changes her position suddenly and she is very cautious about that.  Reports she follows up with cardiology and they are aware.  Patient denies any suicidality, homicidality or perceptual disturbances.  Patient denies any other concerns today.  Visit Diagnosis:    ICD-10-CM   1. GAD (generalized anxiety disorder)  F41.1 DULoxetine (CYMBALTA) 30 MG capsule    gabapentin (NEURONTIN) 300 MG capsule    hydrOXYzine (VISTARIL) 25 MG capsule    2. Bipolar disorder, in full remission, most recent episode depressed (HCC)  F31.76 gabapentin (NEURONTIN) 300 MG capsule    3. Insomnia due to medical condition  G47.01 traZODone (DESYREL) 100 MG tablet   Pain      Past Psychiatric History: Reviewed past psychiatric history from progress note on 04/26/2020.  Past Medical History:  Past Medical History:  Diagnosis Date   ADD (attention deficit disorder)     Anxiety    Aortic aneurysm (HCC)    Atherosclerosis of abdominal aorta (HCC)    Bipolar disorder (HCC)    Bulging of lumbar intervertebral disc    CAD (coronary artery disease)    Cancer (HCC)    COPD (chronic obstructive pulmonary disease) (HCC)    Depression    Diverticulitis    Dyspnea    History of kidney stones    Hyperlipidemia    Hypertension    Insomnia    Lumbar spondylosis    MI (myocardial infarction) ([facility])    2007, 2019   Migraine    Osteoporosis    PONV (postoperative nausea and vomiting)    PVD (peripheral vascular disease) (HCC)    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HCC)    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   SVT (supraventricular tachycardia)     Past Surgical History:  Procedure Laterality Date   ABDOMINAL HYSTERECTOMY     CAROTID ANGIOGRAPHY N/A 01/13/2018   Procedure: CAROTID ANGIOGRAPHY;  Surgeon: Algernon Huxley, MD;  Location: [facility];  Service: Cardiovascular;  Laterality: N/A;   COLONOSCOPY     CORONARY ANGIOPLASTY WITH STENT PLACEMENT     ENDOVASCULAR REPAIR/STENT GRAFT Bilateral 12/14/2020   Procedure: ENDOVASCULAR REPAIR/STENT GRAFT;  Surgeon: Algernon Huxley, MD;  Location: [facility];  Service: Cardiovascular;  Laterality: Bilateral;   iv INJECTIONS TO BACK     LEFT HEART CATH Right 07/07/2018   Procedure: Left Heart Cath and Coronary Angiography;  Surgeon: Neoma Laming  A, MD;  Location: [facility];  Service: Cardiovascular;  Laterality: Right;    Family Psychiatric History: Reviewed family psychiatric history from progress note on 04/26/2020.  Family History:  Family History  Problem Relation Age of Onset   Breast cancer Mother 55   Breast cancer Maternal Aunt        mat aunt and great aunt   Depression Sister     Social History: Reviewed social history from progress note on 04/26/2020. Social History   Socioeconomic History   Marital status: Divorced    Spouse name: Not on file    Number of children: Not on file   Years of education: Not on file   Highest education level: Not on file  Occupational History   Occupation: disabled  Tobacco Use   Smoking status: Former    Packs/day: 1.00    Years: 41.00    Total pack years: 41.00    Types: Cigarettes    Quit date: 09/19/2018    Years since quitting: 3.9   Smokeless tobacco: Never   Tobacco comments:    uses nicotine gums  Substance and Sexual Activity   Alcohol use: No   Drug use: No   Sexual activity: Not on file  Other Topics Concern   Not on file  Social History Narrative   Not on file   Social Determinants of Health   Financial Resource Strain: Not on file  Food Insecurity: Not on file  Transportation Needs: Not on file  Physical Activity: Not on file  Stress: Not on file  Social Connections: Not on file    Allergies:  Allergies  Allergen Reactions   Effexor Xr [Venlafaxine Hcl]     suicidality   Penicillins Swelling   Amoxicillin-Pot Clavulanate    Keflex [Cephalexin] Hives    Metabolic Disorder Labs: No results found for: "HGBA1C", "MPG" No results found for: "PROLACTIN" No results found for: "CHOL", "TRIG", "HDL", "CHOLHDL", "VLDL", "LDLCALC" No results found for: "TSH"  Therapeutic Level Labs: No results found for: "LITHIUM" No results found for: "VALPROATE" No results found for: "CBMZ"  Current Medications: Current Outpatient Medications  Medication Sig Dispense Refill   aspirin EC 81 MG tablet Take 162 mg by mouth every evening.      atorvastatin (LIPITOR) 80 MG tablet Take 80 mg by mouth every evening.      Cholecalciferol (VITAMIN D3) 1.25 MG (50000 UT) CAPS Take 1 tablet by mouth once a week.     clopidogrel (PLAVIX) 75 MG tablet Take 1 tablet (75 mg total) by mouth daily. 90 tablet 3   ezetimibe (ZETIA) 10 MG tablet Take 10 mg by mouth daily.      metoprolol succinate (TOPROL-XL) 50 MG 24 hr tablet Take 50 mg by mouth daily.     Multiple Vitamin (MULTIVITAMIN) tablet  Take 1 tablet by mouth daily. Centrum Silver for Women     nitroGLYCERIN (NITROSTAT) 0.4 MG SL tablet Place 0.4 mg under the tongue every 5 (five) minutes x 3 doses as needed for chest pain.      Probiotic Product (PROBIOTIC DAILY PO) Take 1 capsule by mouth daily.     promethazine (PHENERGAN) 25 MG tablet Take 25 mg by mouth every 8 (eight) hours as needed for nausea or vomiting.     traMADol HCl 100 MG TABS Take by mouth.     DULoxetine (CYMBALTA) 30 MG capsule TAKE 1 CAPSULE(30 MG) BY MOUTH DAILY 90 capsule 1   [START ON 10/11/2022] gabapentin (  NEURONTIN) 300 MG capsule Take 1 capsule (300 mg total) by mouth daily. 90 capsule 1   hydrOXYzine (VISTARIL) 25 MG capsule TAKE 1 CAPSULE BY MOUTH DAILY AS NEEDED FOR ANXIETY AND 2 CAPSULES AT BEDTIME AS NEEDED FOR SLEEP AS DIRECTED 90 capsule 1   traZODone (DESYREL) 100 MG tablet Take 1 tablet (100 mg total) by mouth at bedtime. 90 tablet 1   No current facility-administered medications for this visit.     Musculoskeletal: Strength & Muscle Tone: within normal limits Gait & Station: normal Patient leans: N/A  Psychiatric Specialty Exam: Review of Systems  Musculoskeletal:  Positive for back pain.  Psychiatric/Behavioral: Negative.    All other systems reviewed and are negative.   Blood pressure (!) 89/60, pulse (!) 105, temperature 97.8 F (36.6 C), temperature source Temporal, height '5\' 2"'$  (1.575 m), weight 120 lb 9.6 oz (54.7 kg).Body mass index is 22.06 kg/m.  General Appearance: Casual  Eye Contact:  Fair  Speech:  Clear and Coherent  Volume:  Normal  Mood:  Euthymic  Affect:  Congruent  Thought Process:  Goal Directed and Descriptions of Associations: Intact  Orientation:  Full (Time, Place, and Person)  Thought Content: Logical   Suicidal Thoughts:  No  Homicidal Thoughts:  No  Memory:  Immediate;   Fair Recent;   Fair Remote;   Fair  Judgement:  Fair  Insight:  Fair  Psychomotor Activity:  Normal  Concentration:   Concentration: Fair and Attention Span: Fair  Recall:  [employer] of Knowledge: Fair  Language: Fair  Akathisia:  No  Handed:  Right  AIMS (if indicated): not done  Assets:  Communication Skills Desire for Improvement Housing Social Support  ADL's:  Intact  Cognition: WNL  Sleep:  Fair   Screenings: AIMS    Flowsheet Row Video Visit from 02/14/2022 in [facility] Office Visit from 01/16/2022 in [facility] Total Score 0 0      GAD-7    [place] Visit from 09/05/2022 in [facility] Video Visit from 04/17/2022 in [facility] Video Visit from 02/14/2022 in [facility] Video Visit from 04/26/2021 in [facility]  Total GAD-7 Score '3 1 1 1      '$ PHQ2-9    [place] Visit from 09/05/2022 in [facility] Office Visit from 08/02/2022 in [facility] Office Visit from 06/28/2022 in [facility] Video Visit from 04/17/2022 in [facility] Video Visit from 02/14/2022 in [facility]  PHQ-2 Total Score 2 0 0 0 0  PHQ-9 Total Score 5 -- -- -- --      [place] Office Visit from 09/05/2022 in [facility] Video Visit from 01/23/2021 in [facility] ED from 12/31/2020 in [facility] CATEGORY No Risk No Risk No Risk        Assessment and Plan: Melissa Montes is a 66 year old Caucasian female, divorced, disabled, lives in [place], has a history of bipolar disorder, GAD, aortic aneurysm status post recent repair, diverticulitis , MI status full stent placement, chronic pain was evaluated in office today.  Patient is  currently stable.  Plan GAD-stable Cymbalta 30 mg p.o. daily Hydroxyzine 25 mg as needed for severe anxiety, sleep. Gabapentin 300 mg p.o. nightly  Bipolar disorder depressed in remission  Gabapentin 300 mg p.o. nightly  Insomnia-stable Trazodone 100 mg p.o. nightly as needed Hydroxyzine 25 mg p.o. nightly as needed Continue pain management  Follow-up in clinic in 5 to 6 months or sooner if needed.  Patient to follow-up with cardiology for her blood pressure radiation.      This note was generated in part or whole with voice recognition software. Voice recognition is usually quite accurate but there are transcription errors that can and very often do occur. I apologize for any typographical errors that were not detected and corrected.     Ursula Alert, MD 09/06/2022, 8:41 AM

## 2022-09-17 ENCOUNTER — Encounter (INDEPENDENT_AMBULATORY_CARE_PROVIDER_SITE_OTHER): Payer: Self-pay

## 2022-09-18 DIAGNOSIS — E782 Mixed hyperlipidemia: Secondary | ICD-10-CM | POA: Diagnosis not present

## 2022-09-18 DIAGNOSIS — I2510 Atherosclerotic heart disease of native coronary artery without angina pectoris: Secondary | ICD-10-CM | POA: Diagnosis not present

## 2022-09-18 DIAGNOSIS — I10 Essential (primary) hypertension: Secondary | ICD-10-CM | POA: Diagnosis not present

## 2022-09-25 ENCOUNTER — Encounter: Payer: Self-pay | Admitting: Student in an Organized Health Care Education/Training Program

## 2022-09-25 ENCOUNTER — Ambulatory Visit
Payer: Medicare HMO | Attending: Student in an Organized Health Care Education/Training Program | Admitting: Student in an Organized Health Care Education/Training Program

## 2022-09-25 VITALS — BP 119/91 | HR 90 | Temp 97.1°F | Resp 16 | Ht 62.0 in | Wt 120.0 lb

## 2022-09-25 DIAGNOSIS — G8929 Other chronic pain: Secondary | ICD-10-CM | POA: Diagnosis not present

## 2022-09-25 DIAGNOSIS — M12811 Other specific arthropathies, not elsewhere classified, right shoulder: Secondary | ICD-10-CM | POA: Insufficient documentation

## 2022-09-25 DIAGNOSIS — M160 Bilateral primary osteoarthritis of hip: Secondary | ICD-10-CM | POA: Insufficient documentation

## 2022-09-25 DIAGNOSIS — M5136 Other intervertebral disc degeneration, lumbar region: Secondary | ICD-10-CM | POA: Insufficient documentation

## 2022-09-25 DIAGNOSIS — M25511 Pain in right shoulder: Secondary | ICD-10-CM | POA: Diagnosis not present

## 2022-09-25 DIAGNOSIS — M75101 Unspecified rotator cuff tear or rupture of right shoulder, not specified as traumatic: Secondary | ICD-10-CM | POA: Insufficient documentation

## 2022-09-25 DIAGNOSIS — M47816 Spondylosis without myelopathy or radiculopathy, lumbar region: Secondary | ICD-10-CM | POA: Insufficient documentation

## 2022-09-25 DIAGNOSIS — G894 Chronic pain syndrome: Secondary | ICD-10-CM | POA: Insufficient documentation

## 2022-09-25 MED ORDER — TRAMADOL HCL 100 MG PO TABS: 100.0000 mg | ORAL_TABLET | Freq: Three times a day (TID) | ORAL | 5 refills | Status: DC | PRN

## 2022-09-25 NOTE — Progress Notes (Signed)
PROVIDER NOTE: Information contained herein reflects review and annotations entered in association with encounter. Interpretation of such information and data should be left to medically-trained personnel. Information provided to patient can be located elsewhere in the medical record under "Patient Instructions". Document created using STT-dictation technology, any transcriptional errors that may result from process are unintentional.    Patient: Melissa Montes  Service Category: E/M  Provider: Gillis Santa, MD  DOB: 22-Sep-1956  DOS: 09/25/2022  Referring Provider: Perrin Maltese, MD  MRN: [id number]  Specialty: Interventional Pain Management  PCP: Perrin Maltese, MD  Type: Established Patient  Setting: Ambulatory outpatient    Location: Office  Delivery: Face-to-face     HPI  [name], a 66 y.o. year old female, is here today because of her Lumbar facet arthropathy [M47.816]. [name]'s primary complain today is Back Pain (Lumbar bilateral ) Last encounter: My last encounter with her was on 08/21/22 Pertinent problems: [name] has S/P coronary artery stent placement; Peripheral vascular disease ([place]); GAD (generalized anxiety disorder); S/P coronary artery bypass graft x 2; Osteoporosis, post-menopausal; Chronic pain syndrome; Lumbar facet arthropathy; Bilateral hip pain; Primary osteoarthritis of both hips; Chronic left shoulder pain; Right rotator cuff tear arthropathy; Left rotator cuff tear arthropathy; and Lumbar degenerative disc disease on their pertinent problem list. Pain Assessment: Severity of Chronic pain is reported as a 4 /10. Location: Back Lower, Left, Right/into the buttocks. Onset: More than a month ago. Quality: Discomfort, Constant, Dull, Aching. Timing: Constant. Modifying factor(s): medications help a little. Vitals:  height is _0  (1.575 m) and weight is 120 lb (54.4 kg). Her temporal temperature is 97.1 F (36.2 C) (abnormal). Her blood pressure is  119/91 (abnormal) and her pulse is 90. Her respiration is 16 and oxygen saturation is 93%.   Reason for encounter: medication management.  Patient presents today for medication management.  She has decided against her dental extraction.  She is here for a refill of her tramadol which she takes 100 mg every 8 hours as needed. Urine toxicology screen up-to-date and appropriate   Pharmacotherapy Assessment  Analgesic: Tramadol 100 mg every 8 hours as needed  Monitoring: [place] PMP: PDMP reviewed during this encounter.       Pharmacotherapy: No side-effects or adverse reactions reported. Compliance: No problems identified. Effectiveness: Clinically acceptable.  Janett Billow, RN  09/25/2022  2:21 PM  Sign when Signing Visit Nursing Pain Medication Assessment:  Safety precautions to be maintained throughout the outpatient stay will include: orient to surroundings, keep bed in low position, maintain call bell within reach at all times, provide assistance with transfer out of bed and ambulation.  Medication Inspection Compliance: Pill count conducted under aseptic conditions, in front of the patient. Neither the pills nor the bottle was removed from the patient's sight at any time. Once count was completed pills were immediately returned to the patient in their original bottle.  Medication #1: Buprenorphine (Suboxone) Pill/Patch Count:  40/60 films  Pill/Patch Appearance: Markings consistent with prescribed medication Bottle Appearance: Standard pharmacy container. Clearly labeled. Filled Date: 08 / 15 / 2023 Last Medication intake:   not taking any longer d/t dental issues   Medication #2: Tramadol (Ultram) Pill/Patch Count:  31 of 90 pills remain Pill/Patch Appearance: Markings consistent with prescribed medication Bottle Appearance: Standard pharmacy container. Clearly labeled. Filled Date: 33 / 13 /  2022 Last Medication intake:  Today    No results found for: "CBDTHCR" No  results found for: "  D8THCCBX" No results found for: "D9THCCBX"  UDS:  Summary  Date Value Ref Range Status  02/07/2021 Note  Final    Comment:    ==================================================================== [employer] Select 13 (MW) ==================================================================== Test                             Result       Flag       Units  Drug Present and Declared for Prescription Verification   Tramadol                       >2907        EXPECTED   ng/mg creat   O-Desmethyltramadol            >2907        EXPECTED   ng/mg creat   N-Desmethyltramadol            >2907        EXPECTED   ng/mg creat    Source of tramadol is a prescription medication. O-desmethyltramadol    and N-desmethyltramadol are expected metabolites of tramadol.  ==================================================================== Test                      Result    Flag   Units      Ref Range   Creatinine              172              mg/dL      >=20 ==================================================================== Declared Medications:  The flagging and interpretation on this report are based on the  following declared medications.  Unexpected results may arise from  inaccuracies in the declared medications.   **Note: The testing scope of this panel includes these medications:   Tramadol   **Note: The testing scope of this panel does not include the  following reported medications:   Aspirin  Atorvastatin  Clopidogrel  Duloxetine  Ezetimibe  Fluticasone  Hydroxyzine  Meclizine  Metoprolol  Multivitamin  Nitroglycerin  Probiotic  Promethazine  Trazodone  Vitamin D3 ==================================================================== For clinical consultation, please call [phone]. ====================================================================       ROS  Constitutional: Denies any fever or chills Gastrointestinal: No reported hemesis,  hematochezia, vomiting, or acute GI distress Musculoskeletal: Denies any acute onset joint swelling, redness, loss of ROM, or weakness Neurological: No reported episodes of acute onset apraxia, aphasia, dysarthria, agnosia, amnesia, paralysis, loss of coordination, or loss of consciousness  Medication Review  Buprenorphine HCl, DULoxetine, Probiotic Product, Vitamin D3, aspirin EC, atorvastatin, clopidogrel, ezetimibe, gabapentin, hydrOXYzine, metoprolol succinate, multivitamin, nitroGLYCERIN, promethazine, traMADol HCl, and traZODone  History Review  Allergy: Ms. Brusseau is allergic to effexor xr [venlafaxine hcl], penicillins, amoxicillin-pot clavulanate, and keflex [cephalexin]. Drug: Ms. Yanda  reports no history of drug use. Alcohol:  reports no history of alcohol use. Tobacco:  reports that she quit smoking about 4 years ago. Her smoking use included cigarettes. She has a 41.00 pack-year smoking history. She has never used smokeless tobacco. Social: Ms. Kreiter  reports that she quit smoking about 4 years ago. Her smoking use included cigarettes. She has a 41.00 pack-year smoking history. She has never used smokeless tobacco. She reports that she does not drink alcohol and does not use drugs. Medical:  has a past medical history of ADD (attention deficit disorder), Anxiety, Aortic aneurysm (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 Surgical: Ms. Aleshire  has a past surgical history that includes Coronary angioplasty with stent; Abdominal hysterectomy; CAROTID ANGIOGRAPHY (N/A, 01/13/2018); Left Heart Cath (Right, 07/07/2018); Colonoscopy; iv INJECTIONS TO BACK; and ENDOVASCULAR REPAIR/STENT GRAFT (Bilateral, 12/14/2020). Family: family history includes Breast cancer in her maternal aunt; Breast cancer (age of onset: 69) in her mother; Depression in her sister.  Laboratory Chemistry Profile   Renal Lab Results  Component Value Date   BUN 18 04/28/2021   CREATININE 0.77 04/28/2021   GFRAA >60 07/17/2020   GFRNONAA >60 04/28/2021    Hepatic Lab Results  Component Value Date   AST 22 04/28/2021   ALT 16 04/28/2021   ALBUMIN 3.8 04/28/2021   ALKPHOS 119 04/28/2021    Electrolytes Lab Results  Component Value Date   NA 138 04/28/2021   K 4.0 04/28/2021   CL 100 04/28/2021   CALCIUM 9.2 04/28/2021    Bone No results found for: "VD25OH", "VD125OH2TOT", "NK5397QB3", "AL9379KW4", "25OHVITD1", "25OHVITD2", "25OHVITD3", "TESTOFREE", "TESTOSTERONE"  Inflammation (CRP: Acute Phase) (ESR: Chronic Phase) No results found for: "CRP", "ESRSEDRATE", "LATICACIDVEN"       Note: Above Lab results reviewed.  Recent Imaging Review  CT CHEST LCS NODULE F/U W/O CONTRAST CLINICAL DATA:  Former smoker, quit 2019, 41 pack-year history.  EXAM: CT CHEST WITHOUT CONTRAST FOR LUNG CANCER SCREENING NODULE FOLLOW-UP  TECHNIQUE: Multidetector CT imaging of the chest was performed following the standard protocol without IV contrast.  RADIATION DOSE REDUCTION: This exam was performed according to the departmental dose-optimization program which includes automated exposure control, adjustment of the mA and/or kV according to patient size and/or use of iterative reconstruction technique.  COMPARISON:  09/21/2021.  FINDINGS: Cardiovascular: Atherosclerotic calcification of the aorta and aortic valve. Heart size  normal. No pericardial effusion.  Mediastinum/Nodes: No pathologically enlarged mediastinal or axillary lymph nodes. Hilar regions are difficult to definitively evaluate without IV contrast. Esophagus is grossly unremarkable.  Lungs/Pleura: Bullous paraseptal and centrilobular emphysema. Subpleural atelectasis in the posteromedial left lower lobe (3/207-219). Pulmonary nodules measure 5.0 mm or less in size. No suspicious pulmonary nodules. No pleural fluid. Minimal adherent debris in the airway.  Upper Abdomen: 12 mm fluid density lesion in the right hepatic lobe, unchanged and indicative of a cyst. No follow-up necessary. Stone in the gallbladder. Visualized portions of the adrenal glands and right kidney are unremarkable. Tiny stone in the left kidney. Visualized portions of the spleen, pancreas, stomach and bowel are grossly unremarkable.  Musculoskeletal: Degenerative changes in the spine. No worrisome lytic or sclerotic lesions.  IMPRESSION: 1. Lung-RADS 2, benign appearance or behavior. Continue annual screening with low-dose chest CT without contrast in 12 months. 2. Cholelithiasis. 3. Left renal stone. 4.  Aortic atherosclerosis (ICD10-I70.0). 5.  Emphysema (ICD10-J43.9).  Electronically Signed   By: Lorin Picket M.D.   On: 07/10/2022 08:55 Note: Reviewed        Physical Exam  General appearance: Well nourished, well developed, and well hydrated. In no apparent acute distress Mental status: Alert, oriented x 3 (person, place, & time)       Respiratory: No evidence of acute respiratory distress Eyes: PERLA Vitals:  BP (!) 119/91 (BP Location: Left Arm, Patient Position: Sitting, Cuff Size: Normal)   Pulse 90   Temp (!) 97.1 F (36.2 C) (Temporal)   Resp 16   Ht _0  (1.575 m)   Wt 120 lb (54.4 kg)   SpO2 93%   BMI 21.95 kg/m  BMI: Estimated body mass index is 21.95 kg/m as calculated from the following:   Height as of this encounter: _1  (1.575 m).    Weight as of this encounter: 120 lb (54.4 kg). Ideal: Ideal body weight: 50.1 kg (110 lb 7.2 oz) Adjusted ideal body weight: 51.8 kg (114 lb 4.3 oz)  Assessment   Diagnosis Status  1. Lumbar facet arthropathy   2. Lumbar spondylosis   3. Primary osteoarthritis of both hips   4. Right rotator cuff tear arthropathy   5. Chronic right shoulder pain   6. Lumbar degenerative disc disease   7. Chronic pain syndrome    Controlled Controlled Controlled      Plan of Care   Ms. Sophia Sperry Candelaria has a current medication list which includes the following long-term medication(s): atorvastatin, duloxetine, ezetimibe, [START ON 10/11/2022] gabapentin, metoprolol succinate, nitroglycerin, promethazine, and trazodone.  Discontinue belbuca Friday, October 13.  Start hydrocodone as below prior to dental surgery.  I will have patient follow-up with me in in early November as scheduled to likely wean hydrocodone and restart belbuca.  Pharmacotherapy (Medications Ordered): Meds ordered this encounter  Medications   traMADol HCl 100 MG TABS    Sig: Take 100 mg by mouth every 8 (eight) hours as needed (moderate to severe pain).    Dispense:  90 tablet    Refill:  5   Orders:  No orders of the defined types were placed in this encounter.  Follow-up plan:   Return in about 3 months (around 12/26/2022) for Medication Management, in person.    Recent Visits Date Type Provider Dept  08/21/22 Office Visit Gillis Santa, MD [facility]  08/02/22 Office Visit Gillis Santa, MD [facility]  06/28/22 Office Visit Gillis Santa, MD [facility]  Showing recent visits within past 90 days and meeting all other requirements Today's Visits Date Type Provider Dept  09/25/22 Office Visit Gillis Santa, MD [facility]  Showing today's visits and meeting all other requirements Future Appointments No visits were found meeting these conditions. Showing future  appointments within next 90 days and meeting all other requirements  I discussed the assessment and treatment plan with the patient. The patient was provided an opportunity to ask questions and all were answered. The patient agreed with the plan and demonstrated an understanding of the instructions.  Patient advised to call back or seek an in-person evaluation if the symptoms or condition worsens.  Duration of encounter: 42mnutes.  Total time on encounter, as per AMA guidelines included both the face-to-face and non-face-to-face time personally spent by the physician and/or other qualified health care professional(s) on the day of the encounter (includes time in activities that require the physician or other qualified health care professional and does not include time in activities normally performed by clinical staff). Physician's time may include the following activities when performed: preparing to see the patient (eg, review of tests, pre-charting review of records) obtaining and/or reviewing separately obtained history performing a medically appropriate examination and/or evaluation counseling and educating the patient/family/caregiver ordering medications, tests, or procedures referring and communicating with other health care professionals (when not separately reported) documenting clinical information  in the electronic or other health record independently interpreting results (not separately reported) and communicating results to the patient/ family/caregiver care coordination (not separately reported)  Note by: Gillis Santa, MD Date: 09/25/2022; Time: 2:44 PM

## 2022-09-25 NOTE — Progress Notes (Signed)
Nursing Pain Medication Assessment:  Safety precautions to be maintained throughout the outpatient stay will include: orient to surroundings, keep bed in low position, maintain call bell within reach at all times, provide assistance with transfer out of bed and ambulation.  Medication Inspection Compliance: Pill count conducted under aseptic conditions, in front of the patient. Neither the pills nor the bottle was removed from the patient's sight at any time. Once count was completed pills were immediately returned to the patient in their original bottle.  Medication #1: Buprenorphine (Suboxone) Pill/Patch Count:  40/60 films  Pill/Patch Appearance: Markings consistent with prescribed medication Bottle Appearance: Standard pharmacy container. Clearly labeled. Filled Date: 08 / 15 / 2023 Last Medication intake:   not taking any longer d/t dental issues   Medication #2: Tramadol (Ultram) Pill/Patch Count:  31 of 90 pills remain Pill/Patch Appearance: Markings consistent with prescribed medication Bottle Appearance: Standard pharmacy container. Clearly labeled. Filled Date: 34 / 13 /  2022 Last Medication intake:  Today

## 2022-09-27 ENCOUNTER — Other Ambulatory Visit: Payer: Self-pay | Admitting: Psychiatry

## 2022-09-27 DIAGNOSIS — F411 Generalized anxiety disorder: Secondary | ICD-10-CM

## 2022-11-01 IMAGING — MR MR SACRUM / SI JOINTS WO CM
8 series · 43 of 48 positions shown · non-contrast
Comparison: None.

CLINICAL DATA: Low back pain for 1 year.

EXAM:
MRI LUMBAR SPINE WITHOUT CONTRAST
TECHNIQUE: Multiplanar, multisequence MR imaging of the lumbar spine was
performed. No intravenous contrast was administered.

[Series 2: T1 · axial · 4.0mm · 0.86mm/px · z∈[-130,-7]mm · 6 of 28 slices shown (1 of 5)]
[im 1/28]
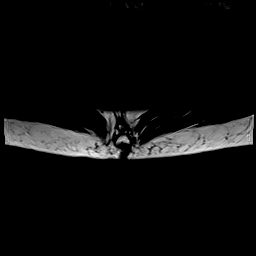
[im 6/28]
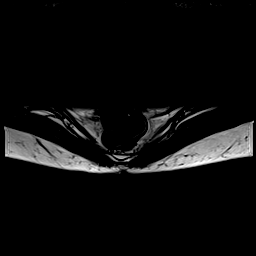
[im 11/28]
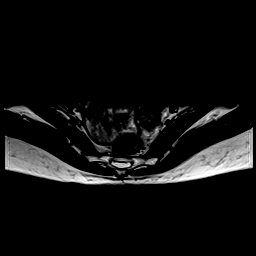
[im 17/28]
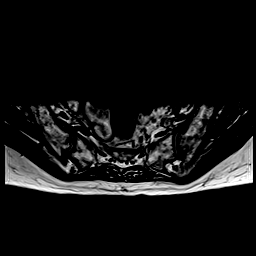
[im 22/28]
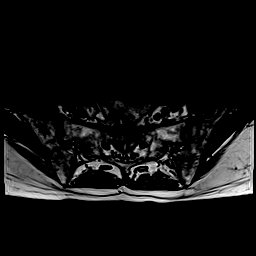
[im 28/28]
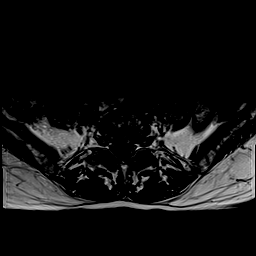

[Series 3: T1 · axial · 4.0mm · 0.86mm/px · z∈[-130,-7]mm · 6 of 28 slices shown (2 of 5)]
[im 1/28]
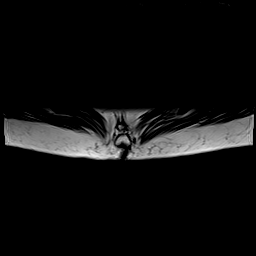
[im 6/28]
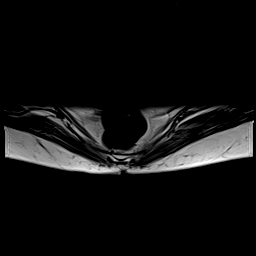
[im 11/28]
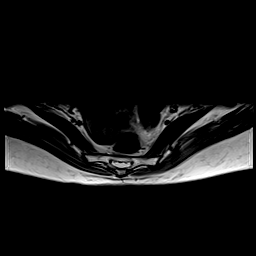
[im 17/28]
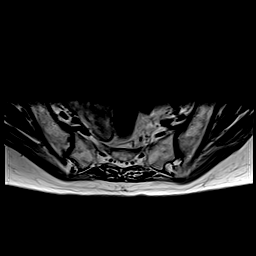
[im 22/28]
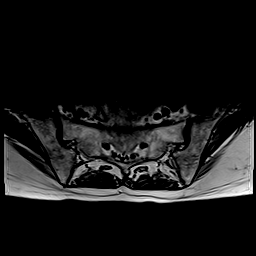
[im 28/28]
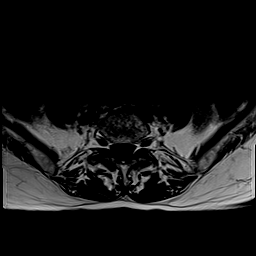

[Series 4: T1 · axial · 4.0mm · 0.86mm/px · z∈[-130,-7]mm · 6 of 28 slices shown (3 of 5)]
[im 1/28]
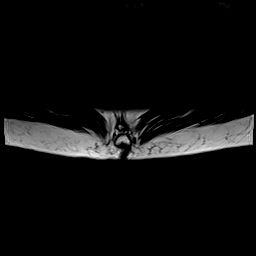
[im 6/28]
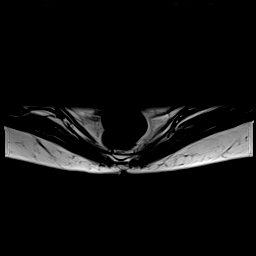
[im 11/28]
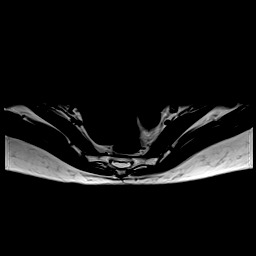
[im 17/28]
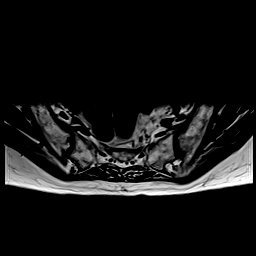
[im 22/28]
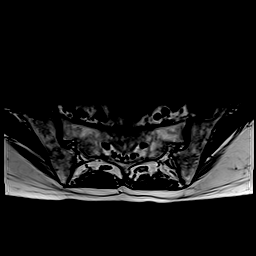
[im 28/28]
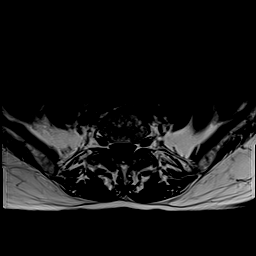

[Series 5: T1 · axial · 4.0mm · 0.86mm/px · z∈[-130,-7]mm · 6 of 28 slices shown (4 of 5)]
[im 1/28]
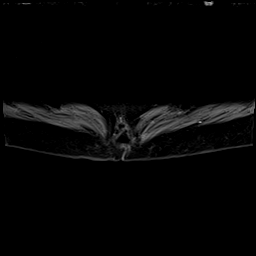
[im 6/28]
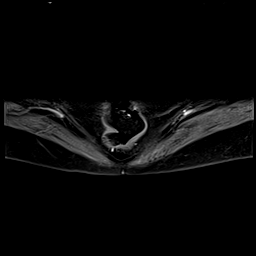
[im 11/28]
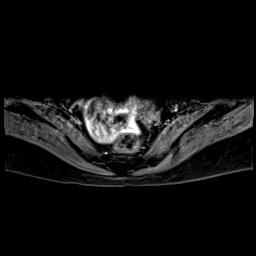
[im 17/28]
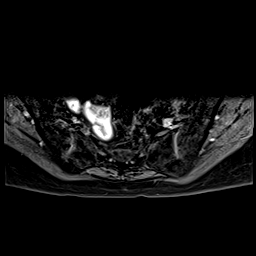
[im 22/28]
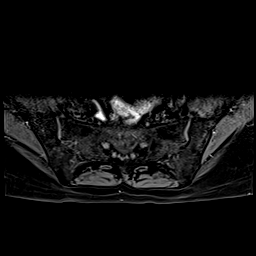
[im 28/28]
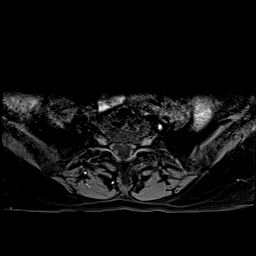

[Series 6: T2 fat-sat · axial · 4.0mm · 0.86mm/px · z∈[-130,-7]mm · 6 of 28 slices shown (1 of 2)]
[im 1/28]
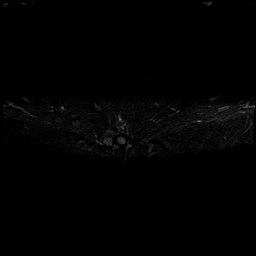
[im 6/28]
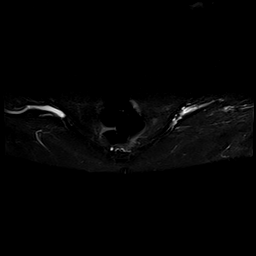
[im 11/28]
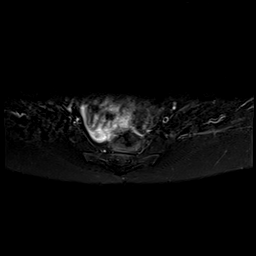
[im 17/28]
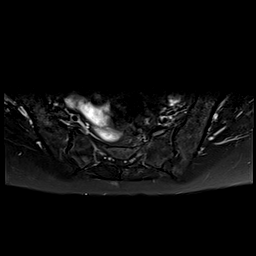
[im 22/28]
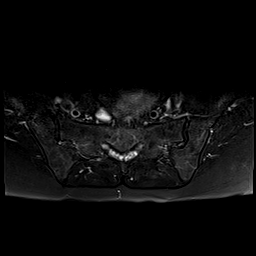
[im 28/28]
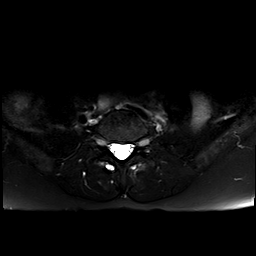

[Series 7: T2 fat-sat · sagittal · 4.0mm · 0.62mm/px · 6 of 27 slices shown (2 of 2)]
[im 1/27]
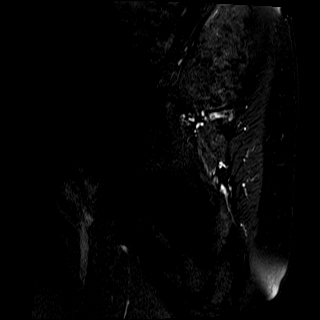
[im 6/27]
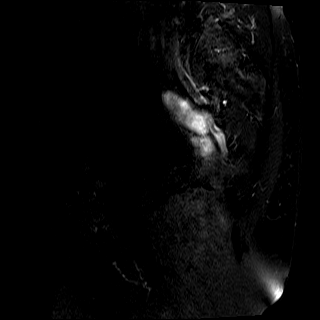
[im 11/27]
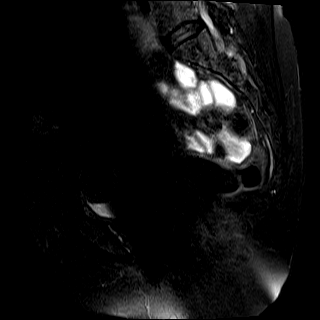
[im 16/27]
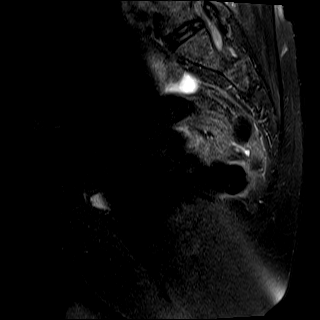
[im 21/27]
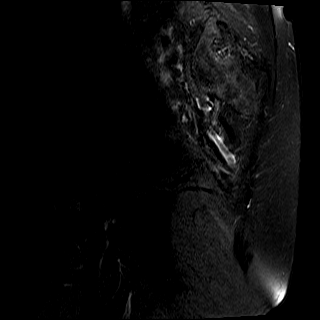
[im 27/27]
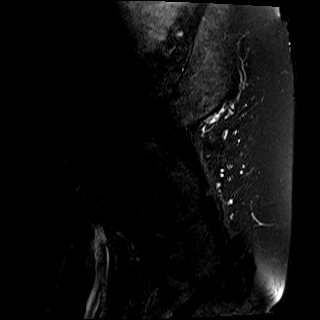

[Series 8: STIR · coronal · 3.0mm · 0.47mm/px · 6 of 25 slices shown]
[im 1/25]
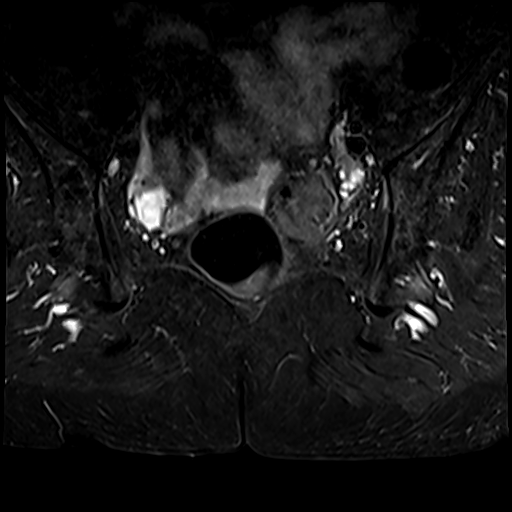
[im 5/25]
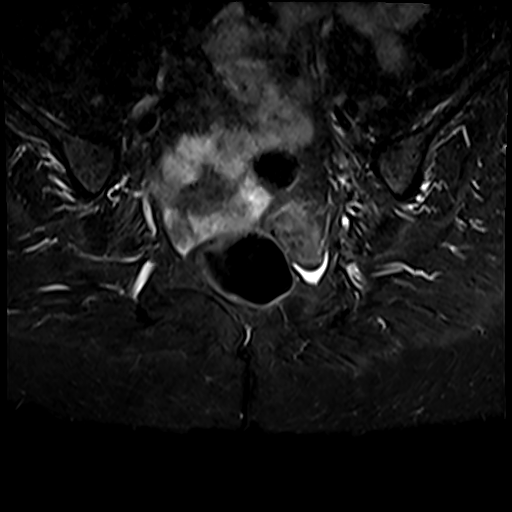
[im 10/25]
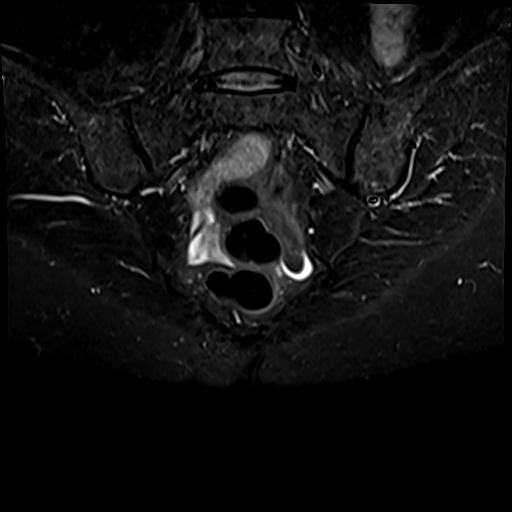
[im 15/25]
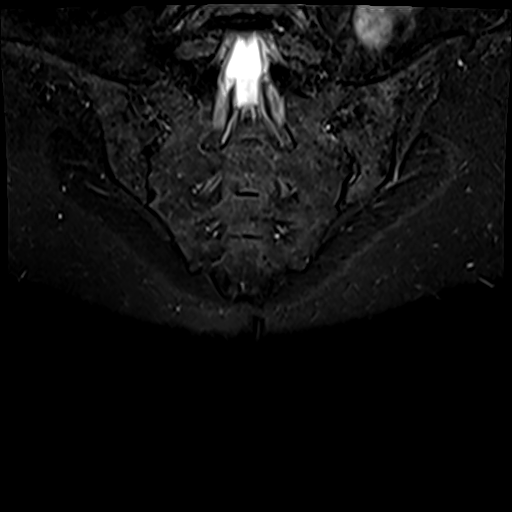
[im 20/25]
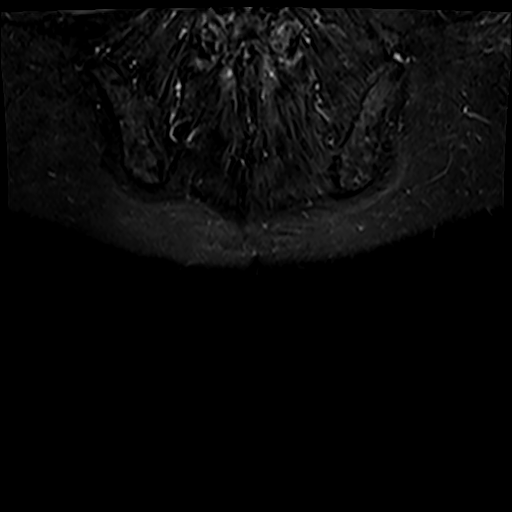
[im 25/25]
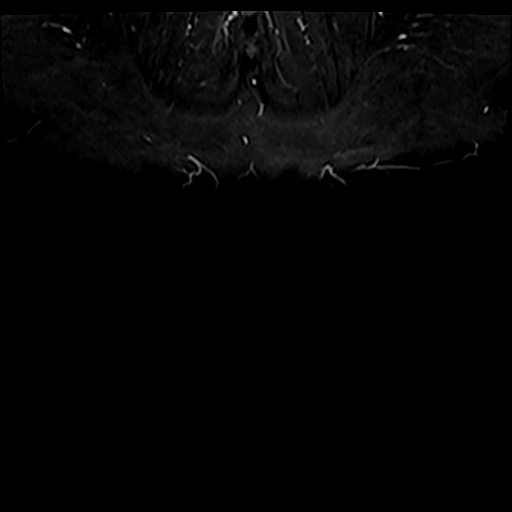

[Series 9: T1 · coronal · 3.0mm · 0.47mm/px · 1 of 25 slices shown (5 of 5)]
[im 1/25]
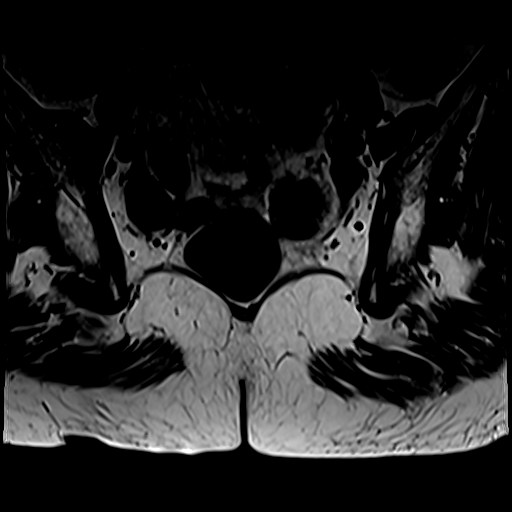

[43 of 48 positions shown; findings below may reference images not displayed]

FINDINGS: Bones/Joint/Cartilage

Marrow signal is normal without fracture, stress change or focal
lesion. Minimal degenerative change is present about the SI joints.
There is no erosion in the joints are not ankylosed. No SI joint
effusion. The L5-S1 level of the lumbar spine is included on the
exam and is normal in appearance.

Ligaments

Intact and normal in appearance.

Muscles and Tendons

Intact and normal in appearance.

Soft tissues

There is a small volume of free fluid in the pelvis. Visualized
intrapelvic contents are otherwise negative.
IMPRESSION: Mild bilateral SI joint degenerative change. No acute or focal bony
abnormality is identified.

Small volume of free pelvic fluid is nonspecific and the cause for
this finding is not identified. Question enteritis.

## 2022-11-29 DIAGNOSIS — I2510 Atherosclerotic heart disease of native coronary artery without angina pectoris: Secondary | ICD-10-CM | POA: Diagnosis not present

## 2022-11-29 DIAGNOSIS — I471 Supraventricular tachycardia, unspecified: Secondary | ICD-10-CM | POA: Diagnosis not present

## 2022-11-29 DIAGNOSIS — I25810 Atherosclerosis of coronary artery bypass graft(s) without angina pectoris: Secondary | ICD-10-CM | POA: Diagnosis not present

## 2022-11-29 DIAGNOSIS — I10 Essential (primary) hypertension: Secondary | ICD-10-CM | POA: Diagnosis not present

## 2022-11-29 DIAGNOSIS — I340 Nonrheumatic mitral (valve) insufficiency: Secondary | ICD-10-CM | POA: Diagnosis not present

## 2022-11-29 DIAGNOSIS — J449 Chronic obstructive pulmonary disease, unspecified: Secondary | ICD-10-CM | POA: Diagnosis not present

## 2022-11-29 DIAGNOSIS — E782 Mixed hyperlipidemia: Secondary | ICD-10-CM | POA: Diagnosis not present

## 2022-12-18 ENCOUNTER — Encounter: Payer: Self-pay | Admitting: Internal Medicine

## 2022-12-20 ENCOUNTER — Ambulatory Visit: Payer: Medicare HMO | Admitting: Internal Medicine

## 2022-12-22 ENCOUNTER — Other Ambulatory Visit: Payer: Self-pay | Admitting: Cardiovascular Disease

## 2022-12-24 ENCOUNTER — Other Ambulatory Visit: Payer: Self-pay

## 2022-12-24 DIAGNOSIS — I471 Supraventricular tachycardia, unspecified: Secondary | ICD-10-CM

## 2022-12-28 ENCOUNTER — Ambulatory Visit: Payer: Medicare HMO | Admitting: Internal Medicine

## 2023-01-04 ENCOUNTER — Encounter: Payer: Self-pay | Admitting: Internal Medicine

## 2023-01-04 ENCOUNTER — Ambulatory Visit (INDEPENDENT_AMBULATORY_CARE_PROVIDER_SITE_OTHER): Payer: Medicare HMO | Admitting: Internal Medicine

## 2023-01-04 VITALS — BP 110/68 | HR 83 | Ht 62.0 in | Wt 123.8 lb

## 2023-01-04 DIAGNOSIS — E782 Mixed hyperlipidemia: Secondary | ICD-10-CM

## 2023-01-04 DIAGNOSIS — N6320 Unspecified lump in the left breast, unspecified quadrant: Secondary | ICD-10-CM

## 2023-01-04 DIAGNOSIS — M19042 Primary osteoarthritis, left hand: Secondary | ICD-10-CM | POA: Diagnosis not present

## 2023-01-04 DIAGNOSIS — F411 Generalized anxiety disorder: Secondary | ICD-10-CM | POA: Diagnosis not present

## 2023-01-04 DIAGNOSIS — I10 Essential (primary) hypertension: Secondary | ICD-10-CM | POA: Diagnosis not present

## 2023-01-04 DIAGNOSIS — N644 Mastodynia: Secondary | ICD-10-CM | POA: Diagnosis not present

## 2023-01-04 DIAGNOSIS — I2510 Atherosclerotic heart disease of native coronary artery without angina pectoris: Secondary | ICD-10-CM

## 2023-01-04 DIAGNOSIS — I739 Peripheral vascular disease, unspecified: Secondary | ICD-10-CM | POA: Diagnosis not present

## 2023-01-04 DIAGNOSIS — R69 Illness, unspecified: Secondary | ICD-10-CM | POA: Diagnosis not present

## 2023-01-04 DIAGNOSIS — M19041 Primary osteoarthritis, right hand: Secondary | ICD-10-CM | POA: Insufficient documentation

## 2023-01-04 NOTE — Progress Notes (Signed)
Established Patient Office Visit  Subjective:  Patient ID: Melissa Montes, female    DOB: 07-07-1956  Age: 67 y.o. MRN: [id number]  Chief Complaint  Patient presents with   Follow-up    Lump in left breast    Patient comes in for a follow-up today.  She has missed previous appointments as she has difficulty getting a ride to come to her doctors appointments.  She also missed her mammogram last year.  Today she reports that she is feeling some pain and swelling in her left breast.  She felt a spot which feels like a palpable cyst however it moves around.  On exam today she is has some tenderness overlying 7:00 on the left breast the right breast is normal and there are no lymph nodes palpable. Patient has also noticed over the last few weeks that her PIP joints of both her hands are feeling painful tender and swollen she.  She has some morning stiffness.  However she is able to use the hands at this time.  She does not have a family history of rheumatoid arthritis.  However will check x-rays of both hands as well as arthritis panel.     Past Medical History:  Diagnosis Date   ADD (attention deficit disorder)    Anxiety    Aortic aneurysm (HCC)    Atherosclerosis of abdominal aorta (HCC)    Bipolar disorder (HCC)    Bulging of lumbar intervertebral disc    CAD (coronary artery disease)    Cancer (HCC)    COPD (chronic obstructive pulmonary disease) (HCC)    Depression    Diverticulitis    Dyspnea    History of kidney stones    Hyperlipidemia    Hypertension    Insomnia    Lumbar spondylosis    MI (myocardial infarction) ([place])    2007, 2019   Migraine    Osteoporosis    PONV (postoperative nausea and vomiting)    PVD (peripheral vascular disease) (HCC)    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HCC)    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   SVT (supraventricular tachycardia)     Social History   Socioeconomic History   Marital status: Divorced     Spouse name: Not on file   Number of children: Not on file   Years of education: Not on file   Highest education level: Not on file  Occupational History   Occupation: disabled  Tobacco Use   Smoking status: Former    Packs/day: 1.00    Years: 41.00    Total pack years: 41.00    Types: Cigarettes    Quit date: 09/19/2018    Years since quitting: 4.2   Smokeless tobacco: Never   Tobacco comments:    uses nicotine gums  Substance and Sexual Activity   Alcohol use: No   Drug use: No   Sexual activity: Not on file  Other Topics Concern   Not on file  Social History Narrative   Not on file   Social Determinants of Health   Financial Resource Strain: Not on file  Food Insecurity: Not on file  Transportation Needs: Not on file  Physical Activity: Not on file  Stress: Not on file  Social Connections: Not on file  Intimate Partner Violence: Not on file    Family History  Problem Relation Age of Onset   Breast cancer Mother 30   Breast cancer Maternal Aunt  mat aunt and great aunt   Depression Sister     Allergies  Allergen Reactions   Effexor Xr [Venlafaxine Hcl]     suicidality   Penicillins Swelling   Amoxicillin-Pot Clavulanate    Keflex [Cephalexin] Hives    Review of Systems  Constitutional:  Positive for malaise/fatigue. Negative for chills, fever and weight loss.  HENT:  Negative for congestion, ear discharge, ear pain, hearing loss, nosebleeds, sinus pain, sore throat and tinnitus.   Eyes:  Negative for blurred vision, double vision, photophobia, pain and discharge.  Respiratory:  Negative for cough, hemoptysis, sputum production, shortness of breath and wheezing.   Cardiovascular:  Negative for chest pain, palpitations, orthopnea, claudication, leg swelling and PND.  Gastrointestinal:  Negative for abdominal pain, blood in stool, constipation, diarrhea, heartburn, nausea and vomiting.  Musculoskeletal:  Positive for back pain, joint pain, myalgias  and neck pain. Negative for falls.  Skin:  Negative for itching and rash.  Neurological:  Negative for dizziness, tingling, tremors, sensory change, speech change and headaches.  Psychiatric/Behavioral:  Negative for depression and memory loss. The patient is nervous/anxious. The patient does not have insomnia.        Objective:   BP 110/68   Pulse 83   Ht 5' 2"$  (1.575 m)   Wt 123 lb 12.8 oz (56.2 kg)   SpO2 93%   BMI 22.64 kg/m   Vitals:   01/04/23 1358  BP: 110/68  Pulse: 83  Height: 5' 2"$  (1.575 m)  Weight: 123 lb 12.8 oz (56.2 kg)  SpO2: 93%  BMI (Calculated): 22.64    Physical Exam Vitals and nursing note reviewed.  Constitutional:      Appearance: Normal appearance.  HENT:     Head: Normocephalic.  Cardiovascular:     Rate and Rhythm: Normal rate.     Pulses: Normal pulses.     Heart sounds: Normal heart sounds.  Pulmonary:     Effort: Pulmonary effort is normal.     Breath sounds: Normal breath sounds.  Chest:  Breasts:    Breasts are symmetrical.     Right: Normal. No swelling, bleeding, inverted nipple, mass, nipple discharge, skin change or tenderness.     Left: Swelling and tenderness present. No bleeding, inverted nipple, mass, nipple discharge or skin change.  Abdominal:     General: Abdomen is flat. Bowel sounds are normal.     Palpations: Abdomen is soft.  Musculoskeletal:        General: Normal range of motion.     Right hand: Swelling and tenderness present.     Left hand: Swelling and tenderness present.     Cervical back: Normal range of motion and neck supple.  Skin:    General: Skin is warm and dry.  Neurological:     General: No focal deficit present.     Mental Status: She is alert and oriented to person, place, and time.  Psychiatric:        Mood and Affect: Mood normal.        Behavior: Behavior normal.      No results found for any visits on 01/04/23.  No results found for this or any previous visit (from the past 2160  hour(s)).    Assessment & Plan:  Patient with Left bReast pain and tenderness- will set up Diagnostic mammo. Xrays of both hands, and Arthritis panel.  Problem List Items Addressed This Visit     Peripheral vascular disease ([place]) - Primary   Mixed  hyperlipidemia   Relevant Orders   Lipid Panel w/o Chol/HDL Ratio   GAD (generalized anxiety disorder)   CAD (coronary artery disease)   Relevant Orders   CBC With Differential   Benign essential hypertension   Relevant Orders   CMP14+EGFR   Other Visit Diagnoses     Arthritis of both hands       Relevant Orders   Arthritis Panel   DG HANDS 1 VIEW BILAT BALLCATCHERS   Painful lumpy left breast       Relevant Orders   MM Digital Diagnostic Bilat       Return in about 4 weeks (around 02/01/2023).   Total time spent: 30 minutes  Perrin Maltese, MD  01/04/2023

## 2023-01-05 LAB — CMP14+EGFR
ALT: 17 IU/L (ref 0–32)
AST: 20 IU/L (ref 0–40)
Albumin/Globulin Ratio: 2 (ref 1.2–2.2)
Albumin: 4.3 g/dL (ref 3.9–4.9)
Alkaline Phosphatase: 135 IU/L — ABNORMAL HIGH (ref 44–121)
BUN/Creatinine Ratio: 22 (ref 12–28)
BUN: 17 mg/dL (ref 8–27)
Bilirubin Total: 0.4 mg/dL (ref 0.0–1.2)
CO2: 23 mmol/L (ref 20–29)
Calcium: 9.3 mg/dL (ref 8.7–10.3)
Chloride: 103 mmol/L (ref 96–106)
Creatinine, Ser: 0.76 mg/dL (ref 0.57–1.00)
Globulin, Total: 2.1 g/dL (ref 1.5–4.5)
Glucose: 99 mg/dL (ref 70–99)
Potassium: 4.6 mmol/L (ref 3.5–5.2)
Sodium: 140 mmol/L (ref 134–144)
Total Protein: 6.4 g/dL (ref 6.0–8.5)
eGFR: 86 mL/min/{1.73_m2} (ref >59)

## 2023-01-05 LAB — CBC WITH DIFFERENTIAL
Basophils Absolute: 0.1 10*3/uL (ref 0.0–0.2)
Basos: 1 %
EOS (ABSOLUTE): 0.4 10*3/uL (ref 0.0–0.4)
Eos: 4 %
Hematocrit: 40.9 % (ref 34.0–46.6)
Hemoglobin: 14.3 g/dL (ref 11.1–15.9)
Immature Grans (Abs): 0 10*3/uL (ref 0.0–0.1)
Immature Granulocytes: 0 %
Lymphocytes Absolute: 2.3 10*3/uL (ref 0.7–3.1)
Lymphs: 24 %
MCH: 30.8 pg (ref 26.6–33.0)
MCHC: 35 g/dL (ref 31.5–35.7)
MCV: 88 fL (ref 79–97)
Monocytes Absolute: 0.7 10*3/uL (ref 0.1–0.9)
Monocytes: 7 %
Neutrophils Absolute: 6 10*3/uL (ref 1.4–7.0)
Neutrophils: 64 %
RBC: 4.64 x10E6/uL (ref 3.77–5.28)
RDW: 12.2 % (ref 11.7–15.4)
WBC: 9.6 10*3/uL (ref 3.4–10.8)

## 2023-01-05 LAB — LIPID PANEL W/O CHOL/HDL RATIO
Cholesterol, Total: 146 mg/dL (ref 100–199)
HDL: 61 mg/dL (ref >39)
LDL Chol Calc (NIH): 69 mg/dL (ref 0–99)
Triglycerides: 82 mg/dL (ref 0–149)
VLDL Cholesterol Cal: 16 mg/dL (ref 5–40)

## 2023-01-05 LAB — ARTHRITIS PANEL
Anti Nuclear Antibody (ANA): NEGATIVE
Rheumatoid fact SerPl-aCnc: <10 IU/mL (ref <14.0)
Sed Rate: 8 mm/hr (ref 0–40)
Uric Acid: 4.2 mg/dL (ref 3.0–7.2)

## 2023-01-07 ENCOUNTER — Other Ambulatory Visit: Payer: Self-pay | Admitting: Internal Medicine

## 2023-01-07 DIAGNOSIS — M160 Bilateral primary osteoarthritis of hip: Secondary | ICD-10-CM

## 2023-01-08 ENCOUNTER — Other Ambulatory Visit: Payer: Self-pay | Admitting: Internal Medicine

## 2023-01-08 DIAGNOSIS — N6320 Unspecified lump in the left breast, unspecified quadrant: Secondary | ICD-10-CM

## 2023-01-15 ENCOUNTER — Other Ambulatory Visit: Payer: Medicare HMO

## 2023-01-24 ENCOUNTER — Ambulatory Visit
Admission: RE | Admit: 2023-01-24 | Discharge: 2023-01-24 | Disposition: A | Discharge status: Home / Self Care | Payer: Medicare HMO | Source: Ambulatory Visit | Attending: Internal Medicine | Admitting: Internal Medicine

## 2023-01-24 DIAGNOSIS — M79622 Pain in left upper arm: Secondary | ICD-10-CM | POA: Diagnosis not present

## 2023-01-24 DIAGNOSIS — N644 Mastodynia: Secondary | ICD-10-CM | POA: Insufficient documentation

## 2023-01-24 DIAGNOSIS — N6320 Unspecified lump in the left breast, unspecified quadrant: Secondary | ICD-10-CM | POA: Diagnosis not present

## 2023-02-01 ENCOUNTER — Other Ambulatory Visit: Payer: Self-pay | Admitting: Internal Medicine

## 2023-02-01 ENCOUNTER — Other Ambulatory Visit: Payer: Self-pay | Admitting: Family

## 2023-02-01 ENCOUNTER — Ambulatory Visit
Admission: RE | Admit: 2023-02-01 | Discharge: 2023-02-01 | Disposition: A | Discharge status: Home / Self Care | Payer: Medicare HMO | Source: Ambulatory Visit | Attending: Internal Medicine | Admitting: Internal Medicine

## 2023-02-01 ENCOUNTER — Ambulatory Visit
Admission: RE | Admit: 2023-02-01 | Discharge: 2023-02-01 | Disposition: A | Discharge status: Home / Self Care | Payer: Medicare HMO | Attending: Internal Medicine | Admitting: Internal Medicine

## 2023-02-01 ENCOUNTER — Encounter: Payer: Self-pay | Admitting: Internal Medicine

## 2023-02-01 ENCOUNTER — Ambulatory Visit (INDEPENDENT_AMBULATORY_CARE_PROVIDER_SITE_OTHER): Payer: Medicare HMO | Admitting: Internal Medicine

## 2023-02-01 VITALS — BP 112/60 | HR 90 | Ht 62.0 in | Wt 122.0 lb

## 2023-02-01 DIAGNOSIS — M160 Bilateral primary osteoarthritis of hip: Secondary | ICD-10-CM

## 2023-02-01 DIAGNOSIS — E782 Mixed hyperlipidemia: Secondary | ICD-10-CM | POA: Diagnosis not present

## 2023-02-01 DIAGNOSIS — M25841 Other specified joint disorders, right hand: Secondary | ICD-10-CM | POA: Diagnosis not present

## 2023-02-01 DIAGNOSIS — M19042 Primary osteoarthritis, left hand: Secondary | ICD-10-CM | POA: Diagnosis not present

## 2023-02-01 DIAGNOSIS — M19041 Primary osteoarthritis, right hand: Secondary | ICD-10-CM | POA: Insufficient documentation

## 2023-02-01 DIAGNOSIS — J301 Allergic rhinitis due to pollen: Secondary | ICD-10-CM | POA: Diagnosis not present

## 2023-02-01 DIAGNOSIS — I2510 Atherosclerotic heart disease of native coronary artery without angina pectoris: Secondary | ICD-10-CM | POA: Diagnosis not present

## 2023-02-01 DIAGNOSIS — M25842 Other specified joint disorders, left hand: Secondary | ICD-10-CM | POA: Diagnosis not present

## 2023-02-01 MED ORDER — FLUTICASONE PROPIONATE 50 MCG/ACT NA SUSP: 1.0000 | Freq: Every day | NASAL | 2 refills | Status: DC

## 2023-02-01 NOTE — Progress Notes (Signed)
Established Patient Office Visit  Subjective:  Patient ID: Melissa Montes, female    DOB: [birth year]  Age: 67 y.o. MRN: [id number]  Chief Complaint  Patient presents with   Follow-up    4 week follow up    Patient comes in for her follow-up today.  At her last visit she had complained of a painful tender lump in her left breast.  She has undergone mammogram and an ultrasound and no suspicious area of concern was identified.  She has been advised to get her mammogram next year as her annual mammogram. Patient continues to have pain in her back, as well as pain and tenderness in both her hands and both her hips.  Her arthritis panel was unremarkable.  Her x-rays are still pending she will come back next week for x-rays. Patient complains of some fullness and crackling noises in both her ears.  On exam she has opaque bilateral tympanic membranes. And she has irritated floor of her right external ear canal. Denies any fevers or chills, no sore throat, no headaches or dizziness. Patient does have history of allergic rhinitis, but she ran out of Flonase nasal spray.  Will send in a prescription.    No other concerns at this time.   Past Medical History:  Diagnosis Date   ADD (attention deficit disorder)    Anxiety    Aortic aneurysm (HCC)    Atherosclerosis of abdominal aorta (HCC)    Bipolar disorder (HCC)    Bulging of lumbar intervertebral disc    CAD (coronary artery disease)    Cancer (HCC)    COPD (chronic obstructive pulmonary disease) (HCC)    Depression    Diverticulitis    Dyspnea    History of kidney stones    Hyperlipidemia    Hypertension    Insomnia    Lumbar spondylosis    MI (myocardial infarction) ([place])    2007, 2019   Migraine    Osteoporosis    PONV (postoperative nausea and vomiting)    PVD (peripheral vascular disease) (HCC)    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HCC)    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   SVT  (supraventricular tachycardia)     Past Surgical History:  Procedure Laterality Date   ABDOMINAL HYSTERECTOMY     CAROTID ANGIOGRAPHY N/A 01/13/2018   Procedure: CAROTID ANGIOGRAPHY;  Surgeon: Algernon Huxley, MD;  Location: [facility];  Service: Cardiovascular;  Laterality: N/A;   COLONOSCOPY     CORONARY ANGIOPLASTY WITH STENT PLACEMENT     ENDOVASCULAR REPAIR/STENT GRAFT Bilateral 12/14/2020   Procedure: ENDOVASCULAR REPAIR/STENT GRAFT;  Surgeon: Algernon Huxley, MD;  Location: [facility];  Service: Cardiovascular;  Laterality: Bilateral;   iv INJECTIONS TO BACK     LEFT HEART CATH Right 07/07/2018   Procedure: Left Heart Cath and Coronary Angiography;  Surgeon: Dionisio David, MD;  Location: [facility];  Service: Cardiovascular;  Laterality: Right;    Social History   Socioeconomic History   Marital status: Divorced    Spouse name: Not on file   Number of children: Not on file   Years of education: Not on file   Highest education level: Not on file  Occupational History   Occupation: disabled  Tobacco Use   Smoking status: Former    Packs/day: 1.00    Years: 41.00    Additional pack years: 0.00    Total pack years: 41.00  Types: Cigarettes    Quit date: 09/19/2018    Years since quitting: 4.3   Smokeless tobacco: Never   Tobacco comments:    uses nicotine gums  Substance and Sexual Activity   Alcohol use: No   Drug use: No   Sexual activity: Not on file  Other Topics Concern   Not on file  Social History Narrative   Not on file   Social Determinants of Health   Financial Resource Strain: Not on file  Food Insecurity: Not on file  Transportation Needs: Not on file  Physical Activity: Not on file  Stress: Not on file  Social Connections: Not on file  Intimate Partner Violence: Not on file    Family History  Problem Relation Age of Onset   Breast cancer Mother 46   Breast cancer Maternal Aunt        mat aunt and great aunt    Depression Sister     Allergies  Allergen Reactions   Effexor Xr [Venlafaxine Hcl]     suicidality   Penicillins Swelling   Amoxicillin-Pot Clavulanate    Keflex [Cephalexin] Hives    Review of Systems  Constitutional:  Negative for chills, diaphoresis, fever, malaise/fatigue and weight loss.  HENT:  Positive for congestion and ear pain. Negative for hearing loss, sinus pain and sore throat.   Eyes: Negative.   Respiratory: Negative.  Negative for stridor.   Cardiovascular: Negative.   Gastrointestinal: Negative.   Genitourinary: Negative.   Musculoskeletal:  Positive for back pain and joint pain.  Skin: Negative.   Neurological:  Positive for dizziness.  Psychiatric/Behavioral: Negative.         Objective:   BP 112/60   Pulse 90   Ht 5\' 2"  (1.575 m)   Wt 122 lb (55.3 kg)   SpO2 96%   BMI 22.31 kg/m   Vitals:   02/01/23 1359  BP: 112/60  Pulse: 90  Height: 5\' 2"  (1.575 m)  Weight: 122 lb (55.3 kg)  SpO2: 96%  BMI (Calculated): 22.31    Physical Exam Vitals and nursing note reviewed.  HENT:     Head: Normocephalic.     Ears:     Comments: Bilateral opaque TM, no redness. Mild redness of external ear canal.    Nose: Nose normal.     Mouth/Throat:     Pharynx: Oropharynx is clear.  Cardiovascular:     Rate and Rhythm: Normal rate and regular rhythm.     Pulses: Normal pulses.  Pulmonary:     Effort: Pulmonary effort is normal.     Breath sounds: Normal breath sounds.  Abdominal:     General: Abdomen is flat. Bowel sounds are normal.     Palpations: Abdomen is soft.  Musculoskeletal:     Cervical back: Normal range of motion and neck supple. No tenderness.  Lymphadenopathy:     Cervical: No cervical adenopathy.  Skin:    General: Skin is warm.  Neurological:     General: No focal deficit present.     Mental Status: She is alert and oriented to person, place, and time.  Psychiatric:        Mood and Affect: Mood normal.        Behavior: Behavior  normal.      No results found for any visits on 02/01/23.  Recent Results (from the past 2160 hour(s))  CBC With Differential     Status: None   Collection Time: 01/04/23  2:26 PM  Result Value  Ref Range   WBC 9.6 3.4 - 10.8 x10E3/uL   RBC 4.64 3.77 - 5.28 x10E6/uL   Hemoglobin 14.3 11.1 - 15.9 g/dL   Hematocrit 40.9 34.0 - 46.6 %   MCV 88 79 - 97 fL   MCH 30.8 26.6 - 33.0 pg   MCHC 35.0 31.5 - 35.7 g/dL   RDW 12.2 11.7 - 15.4 %   Neutrophils 64 Not Estab. %   Lymphs 24 Not Estab. %   Monocytes 7 Not Estab. %   Eos 4 Not Estab. %   Basos 1 Not Estab. %   Neutrophils Absolute 6.0 1.4 - 7.0 x10E3/uL   Lymphocytes Absolute 2.3 0.7 - 3.1 x10E3/uL   Monocytes Absolute 0.7 0.1 - 0.9 x10E3/uL   EOS (ABSOLUTE) 0.4 0.0 - 0.4 x10E3/uL   Basophils Absolute 0.1 0.0 - 0.2 x10E3/uL   Immature Granulocytes 0 Not Estab. %   Immature Grans (Abs) 0.0 0.0 - 0.1 x10E3/uL  CMP14+EGFR     Status: Abnormal   Collection Time: 01/04/23  2:26 PM  Result Value Ref Range   Glucose 99 70 - 99 mg/dL   BUN 17 8 - 27 mg/dL   Creatinine, Ser 0.76 0.57 - 1.00 mg/dL   eGFR 86 >59 mL/min/1.73   BUN/Creatinine Ratio 22 12 - 28   Sodium 140 134 - 144 mmol/L   Potassium 4.6 3.5 - 5.2 mmol/L   Chloride 103 96 - 106 mmol/L   CO2 23 20 - 29 mmol/L   Calcium 9.3 8.7 - 10.3 mg/dL   Total Protein 6.4 6.0 - 8.5 g/dL   Albumin 4.3 3.9 - 4.9 g/dL   Globulin, Total 2.1 1.5 - 4.5 g/dL   Albumin/Globulin Ratio 2.0 1.2 - 2.2   Bilirubin Total 0.4 0.0 - 1.2 mg/dL   Alkaline Phosphatase 135 (H) 44 - 121 IU/L   AST 20 0 - 40 IU/L   ALT 17 0 - 32 IU/L  Lipid Panel w/o Chol/HDL Ratio     Status: None   Collection Time: 01/04/23  2:26 PM  Result Value Ref Range   Cholesterol, Total 146 100 - 199 mg/dL   Triglycerides 82 0 - 149 mg/dL   HDL 61 >39 mg/dL   VLDL Cholesterol Cal 16 5 - 40 mg/dL   LDL Chol Calc (NIH) 69 0 - 99 mg/dL  Arthritis Panel     Status: None   Collection Time: 01/04/23  2:26 PM  Result Value  Ref Range   Uric Acid 4.2 3.0 - 7.2 mg/dL    Comment:            Therapeutic target for gout patients: <6.0   Anti Nuclear Antibody (ANA) Negative Negative   Rhuematoid fact SerPl-aCnc <10.0 <14.0 IU/mL   Sed Rate 8 0 - 40 mm/hr      Assessment & Plan:  Patient will get her x-rays done today.  She will start using her Flonase nasal spray, and continue her antihistamines. Problem List Items Addressed This Visit     Mixed hyperlipidemia   CAD (coronary artery disease)   Arthritis of both hips   Arthritis of both hands   Seasonal allergic rhinitis due to pollen - Primary   Relevant Medications   fluticasone (FLONASE) 50 MCG/ACT nasal spray    Return in about 3 months (around 05/04/2023).   Total time spent: 30 minutes  Perrin Maltese, MD  02/01/2023

## 2023-02-06 ENCOUNTER — Other Ambulatory Visit (INDEPENDENT_AMBULATORY_CARE_PROVIDER_SITE_OTHER): Payer: Self-pay | Admitting: Nurse Practitioner

## 2023-02-06 DIAGNOSIS — I7143 Infrarenal abdominal aortic aneurysm, without rupture: Secondary | ICD-10-CM

## 2023-02-06 DIAGNOSIS — I6523 Occlusion and stenosis of bilateral carotid arteries: Secondary | ICD-10-CM

## 2023-02-07 ENCOUNTER — Other Ambulatory Visit: Payer: Self-pay | Admitting: Internal Medicine

## 2023-02-07 DIAGNOSIS — M19041 Primary osteoarthritis, right hand: Secondary | ICD-10-CM

## 2023-02-12 ENCOUNTER — Other Ambulatory Visit: Payer: Self-pay | Admitting: Cardiovascular Disease

## 2023-02-12 ENCOUNTER — Encounter (INDEPENDENT_AMBULATORY_CARE_PROVIDER_SITE_OTHER): Payer: Self-pay | Admitting: Vascular Surgery

## 2023-02-12 ENCOUNTER — Ambulatory Visit (INDEPENDENT_AMBULATORY_CARE_PROVIDER_SITE_OTHER): Payer: Medicare HMO

## 2023-02-12 ENCOUNTER — Ambulatory Visit (INDEPENDENT_AMBULATORY_CARE_PROVIDER_SITE_OTHER): Payer: Medicare HMO | Admitting: Vascular Surgery

## 2023-02-12 VITALS — BP 112/77 | HR 75 | Resp 16 | Wt 123.8 lb

## 2023-02-12 DIAGNOSIS — I6523 Occlusion and stenosis of bilateral carotid arteries: Secondary | ICD-10-CM

## 2023-02-12 DIAGNOSIS — I7143 Infrarenal abdominal aortic aneurysm, without rupture: Secondary | ICD-10-CM

## 2023-02-12 DIAGNOSIS — I10 Essential (primary) hypertension: Secondary | ICD-10-CM

## 2023-02-12 NOTE — Progress Notes (Signed)
MRN : [id number]  Melissa Montes is a 67 y.o. (Jul 30, 1956) female who presents with chief complaint of  Chief Complaint  Patient presents with   Follow-up    Ultrasound follow up  .  History of Present Illness: Patient returns today in follow up of multiple vascular issues.  She is doing well today.  No specific complaints other than her chronic back pain.  She is about 2 years status post endovascular repair of an abdominal aortic aneurysm.  No new abdominal pain or signs of peripheral embolization.  Duplex today shows a patent stent graft without endoleak.  The maximal aortic sac size is 3.61 cm. She is also followed for carotid disease.  No focal neurologic symptoms. Specifically, the patient denies amaurosis fugax, speech or swallowing difficulties, or arm or leg weakness or numbness. Carotid duplex today demonstrates stable, mild stenosis in the 1 to 39% range bilaterally.    Current Outpatient Medications  Medication Sig Dispense Refill   aspirin EC 81 MG tablet Take 162 mg by mouth every evening.      atorvastatin (LIPITOR) 80 MG tablet Take 80 mg by mouth every evening.      Buprenorphine HCl 450 MCG FILM Place 450 mcg inside cheek every 12 (twelve) hours as needed (moderate to severe pain .).     Cholecalciferol (VITAMIN D3) 1.25 MG (50000 UT) CAPS Take 1 tablet by mouth once a week.     clopidogrel (PLAVIX) 75 MG tablet Take 1 tablet (75 mg total) by mouth daily. 90 tablet 3   DULoxetine (CYMBALTA) 30 MG capsule TAKE 1 CAPSULE(30 MG) BY MOUTH DAILY 90 capsule 1   ezetimibe (ZETIA) 10 MG tablet TAKE 1 TABLET BY MOUTH EVERY DAY 90 tablet 1   fluticasone (FLONASE) 50 MCG/ACT nasal spray Place 1 spray into both nostrils daily. 9.9 mL 2   gabapentin (NEURONTIN) 300 MG capsule Take 1 capsule (300 mg total) by mouth daily. 90 capsule 1   hydrOXYzine (VISTARIL) 25 MG capsule TAKE 1 CAPSULE BY MOUTH DAILY AS NEEDED FOR ANXIETY AND 2 CAPSULES AT BEDTIME AS NEEDED FOR SLEEP AS  DIRECTED 90 capsule 1   metoprolol succinate (TOPROL-XL) 50 MG 24 hr tablet TAKE 1 TABLET BY MOUTH EVERY DAY 90 tablet 1   Multiple Vitamin (MULTIVITAMIN) tablet Take 1 tablet by mouth daily. Centrum Silver for Women     nitroGLYCERIN (NITROSTAT) 0.4 MG SL tablet Place 0.4 mg under the tongue every 5 (five) minutes x 3 doses as needed for chest pain.      Probiotic Product (PROBIOTIC DAILY PO) Take 1 capsule by mouth daily.     promethazine (PHENERGAN) 25 MG tablet Take 25 mg by mouth every 8 (eight) hours as needed for nausea or vomiting.     traMADol HCl 100 MG TABS Take 100 mg by mouth every 8 (eight) hours as needed (moderate to severe pain). 90 tablet 5   traZODone (DESYREL) 100 MG tablet Take 1 tablet (100 mg total) by mouth at bedtime. 90 tablet 1   No current facility-administered medications for this visit.    Past Medical History:  Diagnosis Date   ADD (attention deficit disorder)    Anxiety    Aortic aneurysm (HCC)    Atherosclerosis of abdominal aorta (HCC)    Bipolar disorder (HCC)    Bulging of lumbar intervertebral disc    CAD (coronary artery disease)    Cancer (HCC)    COPD (chronic obstructive pulmonary disease) ([place])  Depression    Diverticulitis    Dyspnea    History of kidney stones    Hyperlipidemia    Hypertension    Insomnia    Lumbar spondylosis    MI (myocardial infarction) ([place])    2007, 2019   Migraine    Osteoporosis    PONV (postoperative nausea and vomiting)    PVD (peripheral vascular disease) (HCC)    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HCC)    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   SVT (supraventricular tachycardia)     Past Surgical History:  Procedure Laterality Date   ABDOMINAL HYSTERECTOMY     CAROTID ANGIOGRAPHY N/A 01/13/2018   Procedure: CAROTID ANGIOGRAPHY;  Surgeon: Algernon Huxley, MD;  Location: [facility];  Service: Cardiovascular;  Laterality: N/A;   COLONOSCOPY     CORONARY ANGIOPLASTY WITH  STENT PLACEMENT     ENDOVASCULAR REPAIR/STENT GRAFT Bilateral 12/14/2020   Procedure: ENDOVASCULAR REPAIR/STENT GRAFT;  Surgeon: Algernon Huxley, MD;  Location: [facility];  Service: Cardiovascular;  Laterality: Bilateral;   iv INJECTIONS TO BACK     LEFT HEART CATH Right 07/07/2018   Procedure: Left Heart Cath and Coronary Angiography;  Surgeon: Dionisio David, MD;  Location: [facility];  Service: Cardiovascular;  Laterality: Right;     Social History   Tobacco Use   Smoking status: Former    Packs/day: 1.00    Years: 41.00    Additional pack years: 0.00    Total pack years: 41.00    Types: Cigarettes    Quit date: 09/19/2018    Years since quitting: 4.4   Smokeless tobacco: Never   Tobacco comments:    uses nicotine gums  Substance Use Topics   Alcohol use: No   Drug use: No       Family History  Problem Relation Age of Onset   Breast cancer Mother 29   Breast cancer Maternal Aunt        mat aunt and great aunt   Depression Sister      Allergies  Allergen Reactions   Effexor Xr [Venlafaxine Hcl]     suicidality   Penicillins Swelling   Amoxicillin-Pot Clavulanate    Keflex [Cephalexin] Hives     REVIEW OF SYSTEMS (Negative unless checked)  Constitutional: [] Weight loss  [] Fever  [] Chills Cardiac: [] Chest pain   [] Chest pressure   [] Palpitations   [] Shortness of breath when laying flat   [] Shortness of breath at rest   [] Shortness of breath with exertion. Vascular:  [] Pain in legs with walking   [] Pain in legs at rest   [] Pain in legs when laying flat   [] Claudication   [] Pain in feet when walking  [] Pain in feet at rest  [] Pain in feet when laying flat   [] History of DVT   [] Phlebitis   [] Swelling in legs   [] Varicose veins   [] Non-healing ulcers Pulmonary:   [] Uses home oxygen   [] Productive cough   [] Hemoptysis   [] Wheeze  [] COPD   [] Asthma Neurologic:  [] Dizziness  [] Blackouts   [] Seizures   [] History of stroke   [] History of TIA  [] Aphasia    [] Temporary blindness   [] Dysphagia   [] Weakness or numbness in arms   [] Weakness or numbness in legs Musculoskeletal:  [x] Arthritis   [] Joint swelling   [] Joint pain   [x] Low back pain Hematologic:  [] Easy bruising  [] Easy bleeding   [] Hypercoagulable state   [] Anemic  Gastrointestinal:  [] Blood in stool   [] Vomiting blood  [] Gastroesophageal reflux/heartburn   [] Abdominal pain Genitourinary:  [] Chronic kidney disease   [] Difficult urination  [] Frequent urination  [] Burning with urination   [] Hematuria Skin:  [] Rashes   [] Ulcers   [] Wounds Psychological:  [x] History of anxiety   []  History of major depression.  Physical Examination  BP 112/77 (BP Location: Left Arm)   Pulse 75   Resp 16   Wt 123 lb 12.8 oz (56.2 kg)   BMI 22.64 kg/m  Gen:  WD/WN, NAD Head: [place]/AT, No temporalis wasting. Ear/Nose/Throat: Hearing grossly intact, nares w/o erythema or drainage Eyes: Conjunctiva clear. Sclera non-icteric Neck: Supple.  Trachea midline Pulmonary:  Good air movement, no use of accessory muscles.  Cardiac: RRR, no JVD Vascular:  Vessel Right Left  Radial Palpable Palpable                          PT Palpable Palpable  DP Palpable Palpable   Gastrointestinal: soft, non-tender/non-distended. No guarding/reflex.  Musculoskeletal: M/S 5/5 throughout.  No deformity or atrophy. No edema. Neurologic: Sensation grossly intact in extremities.  Symmetrical.  Speech is fluent.  Psychiatric: Judgment intact, Mood & affect appropriate for pt's clinical situation. Dermatologic: No rashes or ulcers noted.  No cellulitis or open wounds.      Labs Recent Results (from the past 2160 hour(s))  CBC With Differential     Status: None   Collection Time: 01/04/23  2:26 PM  Result Value Ref Range   WBC 9.6 3.4 - 10.8 x10E3/uL   RBC 4.64 3.77 - 5.28 x10E6/uL   Hemoglobin 14.3 11.1 - 15.9 g/dL   Hematocrit 40.9 34.0 - 46.6 %   MCV 88 79 - 97 fL   MCH 30.8 26.6 - 33.0 pg   MCHC 35.0 31.5 -  35.7 g/dL   RDW 12.2 11.7 - 15.4 %   Neutrophils 64 Not Estab. %   Lymphs 24 Not Estab. %   Monocytes 7 Not Estab. %   Eos 4 Not Estab. %   Basos 1 Not Estab. %   Neutrophils Absolute 6.0 1.4 - 7.0 x10E3/uL   Lymphocytes Absolute 2.3 0.7 - 3.1 x10E3/uL   Monocytes Absolute 0.7 0.1 - 0.9 x10E3/uL   EOS (ABSOLUTE) 0.4 0.0 - 0.4 x10E3/uL   Basophils Absolute 0.1 0.0 - 0.2 x10E3/uL   Immature Granulocytes 0 Not Estab. %   Immature Grans (Abs) 0.0 0.0 - 0.1 x10E3/uL  CMP14+EGFR     Status: Abnormal   Collection Time: 01/04/23  2:26 PM  Result Value Ref Range   Glucose 99 70 - 99 mg/dL   BUN 17 8 - 27 mg/dL   Creatinine, Ser 0.76 0.57 - 1.00 mg/dL   eGFR 86 >59 mL/min/1.73   BUN/Creatinine Ratio 22 12 - 28   Sodium 140 134 - 144 mmol/L   Potassium 4.6 3.5 - 5.2 mmol/L   Chloride 103 96 - 106 mmol/L   CO2 23 20 - 29 mmol/L   Calcium 9.3 8.7 - 10.3 mg/dL   Total Protein 6.4 6.0 - 8.5 g/dL   Albumin 4.3 3.9 - 4.9 g/dL   Globulin, Total 2.1 1.5 - 4.5 g/dL   Albumin/Globulin Ratio 2.0 1.2 - 2.2   Bilirubin Total 0.4 0.0 - 1.2 mg/dL   Alkaline Phosphatase 135 (H) 44 - 121 IU/L   AST 20 0 - 40 IU/L   ALT 17 0 - 32 IU/L  Lipid Panel w/o  Chol/HDL Ratio     Status: None   Collection Time: 01/04/23  2:26 PM  Result Value Ref Range   Cholesterol, Total 146 100 - 199 mg/dL   Triglycerides 82 0 - 149 mg/dL   HDL 61 >39 mg/dL   VLDL Cholesterol Cal 16 5 - 40 mg/dL   LDL Chol Calc (NIH) 69 0 - 99 mg/dL  Arthritis Panel     Status: None   Collection Time: 01/04/23  2:26 PM  Result Value Ref Range   Uric Acid 4.2 3.0 - 7.2 mg/dL    Comment:            Therapeutic target for gout patients: <6.0   Anti Nuclear Antibody (ANA) Negative Negative   Rhuematoid fact SerPl-aCnc <10.0 <14.0 IU/mL   Sed Rate 8 0 - 40 mm/hr    Radiology DG HIPS BILAT W OR W/O PELVIS MIN 5 VIEWS  Result Date: 02/04/2023 CLINICAL DATA:  Bilateral hip pain, left-greater-than-right. No recent injury. EXAM: DG HIP  (WITH OR WITHOUT PELVIS) 5+V BILAT COMPARISON:  CT abdomen and pelvis-10/25/2020 FINDINGS: No fracture or dislocation. Very mild degenerative change the bilateral hips with joint space loss, subchondral sclerosis and osteophytosis, left slightly greater than right. No evidence of avascular necrosis. Limited visualization of the pelvis demonstrates a bone island superior to the acetabulum. Vascular stents overlie the inferior aspect of the abdominal aorta and proximal aspects of the bilateral common iliac arteries. Regional soft tissues appear normal. IMPRESSION: Very mild degenerative change of the bilateral hips, left slightly greater than right. Electronically Signed   By: Sandi Mariscal M.D.   On: 02/04/2023 15:46   DG HANDS 1 VIEW BILAT BALLCATCHERS  Result Date: 02/04/2023 CLINICAL DATA:  Bilateral hand pain and tenderness. No recent injury. EXAM: HAND LIMITED 1 VIEW COMPARISON:  None Available. FINDINGS: No definite fracture or dislocation given solitary projection. Apparent degenerative change of the DIP joints of the third and fourth digits with joint space loss, subchondral sclerosis and osteophytosis. Remaining joint spaces appear preserved. No evidence of chondrocalcinosis. Regional soft tissues appear normal. IMPRESSION: Suspected degenerative change of the DIP joints of the third and fourth digits, presumably degenerative in etiology though conceivably erosive osteoarthritis could have a similar appearance. Electronically Signed   By: Sandi Mariscal M.D.   On: 02/04/2023 15:41   MM DIAG BREAST TOMO BILATERAL  Result Date: 01/24/2023 CLINICAL DATA:  Palpable in the LEFT inner breast. LEFT breast pain. EXAM: DIGITAL DIAGNOSTIC BILATERAL MAMMOGRAM WITH TOMOSYNTHESIS; ULTRASOUND LEFT BREAST LIMITED TECHNIQUE: Bilateral digital diagnostic mammography and breast tomosynthesis was performed.; Targeted ultrasound examination of the left breast was performed. COMPARISON:  Previous exam(s). ACR Breast Density  Category c: The breasts are heterogeneously dense, which may obscure small masses. FINDINGS: Spot compression tomosynthesis views were obtained over the palpable area of concern in the LEFT breast. No suspicious mammographic finding is identified in this area. No suspicious mammographic etiology for breast pain is identified. No suspicious mass, microcalcification, or other finding is identified in the LEFT breast. No suspicious mass, distortion, or microcalcifications are identified to suggest presence of malignancy in the RIGHT breast. On physical exam, no suspicious mass is appreciated. No suspicious nipple changes are identified. Targeted LEFT breast ultrasound was performed in the palpable area of concern at the inner breast. No suspicious solid or cystic mass is identified. Targeted ultrasound was performed of the sites of pain in the LEFT axilla and retroareolar breast. No suspicious cystic or solid mass is seen. No suspicious  sonographic etiology for breast pain is identified. IMPRESSION: 1. No mammographic or sonographic evidence of malignancy at the sites of painful or palpable concern in the LEFT breast. Any further workup of the patient's symptoms should be based on the clinical assessment. Recommend routine annual screening mammogram in 1 year. 2. No mammographic evidence of malignancy bilaterally. 3. Breast pain is a common condition, which will often resolve on its own without intervention. It can be affected by hormonal changes, medication side effect, weight changes and fit of the bra. Pain may also be referred from other adjacent areas of the body. Breast pain may be improved by wearing adequate well-fitting support, over-the-counter topical and oral NSAID medication, low-fat diet, and ice/heat as needed. Studies have shown an improvement in cyclic pain with use of evening primrose oil, vitamin D and vitamin E. Clinical follow-up recommended to discuss any further work-up recommendations and  appropriate treatment. RECOMMENDATION: Screening mammogram in one year.(Code:[id number]-B-01Y) I have discussed the findings and recommendations with the patient. If applicable, a reminder letter will be sent to the patient regarding the next appointment. BI-RADS CATEGORY  1: Negative. Electronically Signed   By: Valentino Saxon M.D.   On: 01/24/2023 14:49   US BREAST LTD UNI LEFT INC AXILLA  Result Date: 01/24/2023 CLINICAL DATA:  Palpable in the LEFT inner breast. LEFT breast pain. EXAM: DIGITAL DIAGNOSTIC BILATERAL MAMMOGRAM WITH TOMOSYNTHESIS; ULTRASOUND LEFT BREAST LIMITED TECHNIQUE: Bilateral digital diagnostic mammography and breast tomosynthesis was performed.; Targeted ultrasound examination of the left breast was performed. COMPARISON:  Previous exam(s). ACR Breast Density Category c: The breasts are heterogeneously dense, which may obscure small masses. FINDINGS: Spot compression tomosynthesis views were obtained over the palpable area of concern in the LEFT breast. No suspicious mammographic finding is identified in this area. No suspicious mammographic etiology for breast pain is identified. No suspicious mass, microcalcification, or other finding is identified in the LEFT breast. No suspicious mass, distortion, or microcalcifications are identified to suggest presence of malignancy in the RIGHT breast. On physical exam, no suspicious mass is appreciated. No suspicious nipple changes are identified. Targeted LEFT breast ultrasound was performed in the palpable area of concern at the inner breast. No suspicious solid or cystic mass is identified. Targeted ultrasound was performed of the sites of pain in the LEFT axilla and retroareolar breast. No suspicious cystic or solid mass is seen. No suspicious sonographic etiology for breast pain is identified. IMPRESSION: 1. No mammographic or sonographic evidence of malignancy at the sites of painful or palpable concern in the LEFT breast. Any further workup of  the patient's symptoms should be based on the clinical assessment. Recommend routine annual screening mammogram in 1 year. 2. No mammographic evidence of malignancy bilaterally. 3. Breast pain is a common condition, which will often resolve on its own without intervention. It can be affected by hormonal changes, medication side effect, weight changes and fit of the bra. Pain may also be referred from other adjacent areas of the body. Breast pain may be improved by wearing adequate well-fitting support, over-the-counter topical and oral NSAID medication, low-fat diet, and ice/heat as needed. Studies have shown an improvement in cyclic pain with use of evening primrose oil, vitamin D and vitamin E. Clinical follow-up recommended to discuss any further work-up recommendations and appropriate treatment. RECOMMENDATION: Screening mammogram in one year.(Code:[id number]-B-01Y) I have discussed the findings and recommendations with the patient. If applicable, a reminder letter will be sent to the patient regarding the next appointment. BI-RADS CATEGORY  1:  Negative. Electronically Signed   By: Valentino Saxon M.D.   On: 01/24/2023 14:49   Assessment/Plan  AAA (abdominal aortic aneurysm) without rupture (HCC) Duplex today shows a patent stent graft without endoleak.  The maximal aortic sac size is 3.61 cm.  Doing well.  No change in medications.  Continue to follow with duplex.  Benign essential hypertension blood pressure control important in reducing the progression of atherosclerotic disease. On appropriate oral medications.   Bilateral carotid artery stenosis Carotid duplex today demonstrates stable, mild stenosis in the 1 to 39% range bilaterally.  No role for intervention.  No changes in medication.  Recheck in 1 year.    Leotis Pain, MD  02/12/2023 2:21 PM    This note was created with Dragon medical transcription system.  Any errors from dictation are purely unintentional

## 2023-02-12 NOTE — Assessment & Plan Note (Signed)
Duplex today shows a patent stent graft without endoleak.  The maximal aortic sac size is 3.61 cm.  Doing well.  No change in medications.  Continue to follow with duplex.

## 2023-02-12 NOTE — Assessment & Plan Note (Signed)
blood pressure control important in reducing the progression of atherosclerotic disease. On appropriate oral medications.  

## 2023-02-12 NOTE — Assessment & Plan Note (Signed)
Carotid duplex today demonstrates stable, mild stenosis in the 1 to 39% range bilaterally.  No role for intervention.  No changes in medication.  Recheck in 1 year.

## 2023-02-19 ENCOUNTER — Encounter: Payer: Self-pay | Admitting: Psychiatry

## 2023-02-19 ENCOUNTER — Telehealth (INDEPENDENT_AMBULATORY_CARE_PROVIDER_SITE_OTHER): Payer: Medicare HMO | Admitting: Psychiatry

## 2023-02-19 DIAGNOSIS — G4701 Insomnia due to medical condition: Secondary | ICD-10-CM

## 2023-02-19 DIAGNOSIS — F411 Generalized anxiety disorder: Secondary | ICD-10-CM

## 2023-02-19 DIAGNOSIS — R69 Illness, unspecified: Secondary | ICD-10-CM | POA: Diagnosis not present

## 2023-02-19 DIAGNOSIS — F3176 Bipolar disorder, in full remission, most recent episode depressed: Secondary | ICD-10-CM | POA: Diagnosis not present

## 2023-02-19 MED ORDER — TRAZODONE HCL 100 MG PO TABS: 100.0000 mg | ORAL_TABLET | Freq: Every day | ORAL | 1 refills | Status: DC

## 2023-02-19 MED ORDER — DULOXETINE HCL 30 MG PO CPEP: ORAL_CAPSULE | ORAL | 1 refills | Status: DC

## 2023-02-19 MED ORDER — HYDROXYZINE PAMOATE 25 MG PO CAPS: ORAL_CAPSULE | ORAL | 1 refills | Status: DC

## 2023-02-19 NOTE — Progress Notes (Unsigned)
Virtual Visit via Video Note  I connected with Melissa Montes on 02/19/23 at  3:00 PM EDT by a video enabled telemedicine application and verified that I am speaking with the correct person using two identifiers.  Location Provider Location : [facility] Patient Location : Home  Participants: Patient , Provider    I discussed the limitations of evaluation and management by telemedicine and the availability of in person appointments. The patient expressed understanding and agreed to proceed.   I discussed the assessment and treatment plan with the patient. The patient was provided an opportunity to ask questions and all were answered. The patient agreed with the plan and demonstrated an understanding of the instructions.   The patient was advised to call back or seek an in-person evaluation if the symptoms worsen or if the condition fails to improve as anticipated.    [place] MD OP Progress Note  02/20/2023 12:43 PM Melissa Montes  MRN:  [id number]  Chief Complaint:  Chief Complaint  Patient presents with   Follow-up   Anxiety   Depression   Medication Refill   HPI: Melissa Montes is a 67 year old Caucasian female on disability, divorced, lives in [place], has a history of bipolar disorder, GAD, diverticulitis, abdominal aortic aneurysm, chronic pain was evaluated by telemedicine today.  Patient today reports overall she is doing fairly well with regards to her mood.  Denies any significant sadness, denies any mania or anxiety symptoms.  Patient reports sleep is overall good.  Does take the trazodone as needed.  Currently compliant on the duloxetine, hydroxyzine as needed.  She also is on low dose of gabapentin.  Denies side effects.  Patient denies any suicidality, homicidality or perceptual disturbances.  Does report she does struggle with double vision when she drives her car due to the way the windshield is angled.  She has upcoming appointment with her eye provider for  the same.  She hence is not driving at this time.  Patient does struggle with memory problems however reports she is managing okay and it does not really affect her functioning.  Appeared to be alert, oriented to person place time situation.  Patient denies any other concerns today.  Visit Diagnosis:    ICD-10-CM   1. GAD (generalized anxiety disorder)  F41.1 DULoxetine (CYMBALTA) 30 MG capsule    hydrOXYzine (VISTARIL) 25 MG capsule    2. Bipolar disorder, in full remission, most recent episode depressed  F31.76     3. Insomnia due to medical condition  G47.01 traZODone (DESYREL) 100 MG tablet   Pain      Past Psychiatric History: Reviewed past psychiatric history from progress note on 04/26/2020.  Past Medical History:  Past Medical History:  Diagnosis Date   ADD (attention deficit disorder)    Anxiety    Aortic aneurysm    Atherosclerosis of abdominal aorta    Bipolar disorder    Bulging of lumbar intervertebral disc    CAD (coronary artery disease)    Cancer    COPD (chronic obstructive pulmonary disease)    Depression    Diverticulitis    Dyspnea    History of kidney stones    Hyperlipidemia    Hypertension    Insomnia    Lumbar spondylosis    MI (myocardial infarction)    2007, 2019   Migraine    Osteoporosis    PONV (postoperative nausea and vomiting)    PVD (peripheral vascular disease)    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   SVT (supraventricular tachycardia)     Past Surgical History:  Procedure Laterality Date   ABDOMINAL HYSTERECTOMY     CAROTID ANGIOGRAPHY N/A 01/13/2018   Procedure: CAROTID ANGIOGRAPHY;  Surgeon: Algernon Huxley, MD;  Location: [facility];  Service: Cardiovascular;  Laterality: N/A;   COLONOSCOPY     CORONARY ANGIOPLASTY WITH STENT PLACEMENT     ENDOVASCULAR REPAIR/STENT GRAFT Bilateral 12/14/2020   Procedure: ENDOVASCULAR REPAIR/STENT GRAFT;  Surgeon: Algernon Huxley,  MD;  Location: [facility];  Service: Cardiovascular;  Laterality: Bilateral;   iv INJECTIONS TO BACK     LEFT HEART CATH Right 07/07/2018   Procedure: Left Heart Cath and Coronary Angiography;  Surgeon: Dionisio David, MD;  Location: [facility];  Service: Cardiovascular;  Laterality: Right;    Family Psychiatric History: Reviewed family psychiatric history from progress note on 04/26/2020.  Family History:  Family History  Problem Relation Age of Onset   Breast cancer Mother 43   Breast cancer Maternal Aunt        mat aunt and great aunt   Depression Sister     Social History: Reviewed social history from progress note on 04/26/2020. Social History   Socioeconomic History   Marital status: Divorced    Spouse name: Not on file   Number of children: Not on file   Years of education: Not on file   Highest education level: Not on file  Occupational History   Occupation: disabled  Tobacco Use   Smoking status: Former    Packs/day: 1.00    Years: 41.00    Additional pack years: 0.00    Total pack years: 41.00    Types: Cigarettes    Quit date: 09/19/2018    Years since quitting: 4.4   Smokeless tobacco: Never   Tobacco comments:    uses nicotine gums  Substance and Sexual Activity   Alcohol use: No   Drug use: No   Sexual activity: Not on file  Other Topics Concern   Not on file  Social History Narrative   Not on file   Social Determinants of Health   Financial Resource Strain: Not on file  Food Insecurity: Not on file  Transportation Needs: Not on file  Physical Activity: Not on file  Stress: Not on file  Social Connections: Not on file    Allergies:  Allergies  Allergen Reactions   Effexor Xr [Venlafaxine Hcl]     suicidality   Penicillins Swelling   Amoxicillin-Pot Clavulanate    Keflex [Cephalexin] Hives    Metabolic Disorder Labs: No results found for: "HGBA1C", "MPG" No results found for: "PROLACTIN" Lab Results  Component Value  Date   CHOL 146 01/04/2023   TRIG 82 01/04/2023   HDL 61 01/04/2023   LDLCALC 69 01/04/2023   No results found for: "TSH"  Therapeutic Level Labs: No results found for: "LITHIUM" No results found for: "VALPROATE" No results found for: "CBMZ"  Current Medications: Current Outpatient Medications  Medication Sig Dispense Refill   aspirin EC 81 MG tablet Take 162 mg by mouth every evening.      atorvastatin (LIPITOR) 80 MG tablet Take 80 mg by mouth every evening.      Buprenorphine HCl 450 MCG FILM Place 450 mcg inside cheek every 12 (twelve) hours as needed (moderate to severe pain .).     Cholecalciferol (VITAMIN D3) 1.25 MG (50000 UT) CAPS Take 1  tablet by mouth once a week.     clopidogrel (PLAVIX) 75 MG tablet Take 1 tablet (75 mg total) by mouth daily. 90 tablet 3   ezetimibe (ZETIA) 10 MG tablet TAKE 1 TABLET BY MOUTH EVERY DAY 90 tablet 1   fluticasone (FLONASE) 50 MCG/ACT nasal spray Place 1 spray into both nostrils daily. 9.9 mL 2   gabapentin (NEURONTIN) 300 MG capsule Take 1 capsule (300 mg total) by mouth daily. 90 capsule 1   metoprolol succinate (TOPROL-XL) 50 MG 24 hr tablet TAKE 1 TABLET BY MOUTH EVERY DAY 90 tablet 1   Multiple Vitamin (MULTIVITAMIN) tablet Take 1 tablet by mouth daily. Centrum Silver for Women     nitroGLYCERIN (NITROSTAT) 0.4 MG SL tablet Place 0.4 mg under the tongue every 5 (five) minutes x 3 doses as needed for chest pain.      Probiotic Product (PROBIOTIC DAILY PO) Take 1 capsule by mouth daily.     promethazine (PHENERGAN) 25 MG tablet Take 25 mg by mouth every 8 (eight) hours as needed for nausea or vomiting.     traMADol HCl 100 MG TABS Take 100 mg by mouth every 8 (eight) hours as needed (moderate to severe pain). 90 tablet 5   DULoxetine (CYMBALTA) 30 MG capsule TAKE 1 CAPSULE(30 MG) BY MOUTH DAILY 90 capsule 1   hydrOXYzine (VISTARIL) 25 MG capsule TAKE 1 CAPSULE BY MOUTH DAILY AS NEEDED FOR ANXIETY AND 2 CAPSULES AT BEDTIME AS NEEDED FOR  SLEEP AS DIRECTED 90 capsule 1   traZODone (DESYREL) 100 MG tablet Take 1 tablet (100 mg total) by mouth at bedtime. 90 tablet 1   No current facility-administered medications for this visit.     Musculoskeletal: Strength & Muscle Tone:  UTA Gait & Station: normal Patient leans: N/A  Psychiatric Specialty Exam: Review of Systems  Eyes:  Positive for visual disturbance (while driving).  Psychiatric/Behavioral: Negative.    All other systems reviewed and are negative.   There were no vitals taken for this visit.There is no height or weight on file to calculate BMI.  General Appearance: Casual  Eye Contact:  Fair  Speech:  Clear and Coherent  Volume:  Normal  Mood:  Euthymic  Affect:  Congruent  Thought Process:  Goal Directed and Descriptions of Associations: Intact  Orientation:  Full (Time, Place, and Person)  Thought Content: Logical   Suicidal Thoughts:  No  Homicidal Thoughts:  No  Memory:  Immediate;   Fair Recent;   Fair Remote;   Fair  Judgement:  Fair  Insight:  Fair  Psychomotor Activity:  Normal  Concentration:  Concentration: Fair and Attention Span: Fair  Recall:  [employer] of Knowledge: Fair  Language: Fair  Akathisia:  No  Handed:  Right  AIMS (if indicated): not done  Assets:  Communication Skills Desire for Improvement Social Support  ADL's:  Intact  Cognition: WNL  Sleep:  Fair   Screenings: AIMS    Flowsheet Row Video Visit from 02/14/2022 in [place] Office Visit from 01/16/2022 in [facility] Total Score 0 0      GAD-7    [place] Office Visit from 09/05/2022 in [facility] Video Visit from 04/17/2022 in [facility] Video Visit from 02/14/2022 in [place] [facility] Video Visit from 04/26/2021 in [place] [facility]  Total GAD-7 Score 3  1 1 1       PHQ2-9    Flowsheet Row Video Visit from 02/19/2023 in [facility] Office Visit from 09/05/2022 in [place] Office Visit from 08/02/2022 in [facility] Visit from 06/28/2022 in [facility] Video Visit from 04/17/2022 in [place]  PHQ-2 Total Score 0 2 0 0 0  PHQ-9 Total Score -- 5 -- -- --      Flowsheet Row Video Visit from 02/19/2023 in [place] Office Visit from 09/05/2022 in [place] Video Visit from 01/23/2021 in [place]  C-SSRS RISK CATEGORY No Risk No Risk No Risk        Assessment and Plan: Ersa Senseney is a 67 year old Caucasian female, divorced, disabled, lives in [place], has a history of bipolar disorder, GAD, aortic aneurysm status post recent repair, diverticulitis, was evaluated by telemedicine today.  Patient is currently stable.  Plan GAD-stable Cymbalta 30 mg p.o. daily Hydroxyzine 25 mg as needed for severe anxiety, sleep Gabapentin 300 mg p.o. nightly  Bipolar disorder in remission Gabapentin 300 mg p.o. nightly  Insomnia-stable Trazodone 100 mg p.o. nightly as needed Hydroxyzine 25 mg p.o. nightly as needed   Follow-up in clinic in 5 to 6 months or sooner if needed.  Patient/Guardian was advised Release of Information must be obtained prior to any record release in order to collaborate their care with an outside provider. Patient/Guardian was advised if they have not already done so to contact the registration department to sign all necessary forms in order for [place] to release information regarding their care.    Consent: Patient/Guardian gives verbal consent for treatment and assignment of benefits for services provided during this visit. Patient/Guardian expressed understanding and agreed to proceed.   This note was generated in part or whole with voice recognition software. Voice recognition is usually quite accurate but there are transcription errors that can and very often do occur. I apologize for any typographical errors that were not detected and corrected.    Ursula Alert, MD 02/20/2023, 12:43 PM

## 2023-02-25 ENCOUNTER — Encounter: Payer: Self-pay | Admitting: Cardiovascular Disease

## 2023-02-25 ENCOUNTER — Ambulatory Visit (INDEPENDENT_AMBULATORY_CARE_PROVIDER_SITE_OTHER): Payer: Medicare HMO | Admitting: Cardiovascular Disease

## 2023-02-25 VITALS — BP 104/74 | HR 83 | Ht 61.0 in | Wt 124.2 lb

## 2023-02-25 DIAGNOSIS — I10 Essential (primary) hypertension: Secondary | ICD-10-CM

## 2023-02-25 DIAGNOSIS — E782 Mixed hyperlipidemia: Secondary | ICD-10-CM

## 2023-02-25 DIAGNOSIS — I2510 Atherosclerotic heart disease of native coronary artery without angina pectoris: Secondary | ICD-10-CM | POA: Diagnosis not present

## 2023-02-25 NOTE — Assessment & Plan Note (Signed)
Denies chest pain,shortness of breath.

## 2023-02-25 NOTE — Assessment & Plan Note (Signed)
LDL 01/04/23 was 69. Continue atorvastatin 80 mg daily.

## 2023-02-25 NOTE — Assessment & Plan Note (Signed)
Well controlled. Continue same medications. 

## 2023-02-25 NOTE — Progress Notes (Signed)
Cardiology Office Note   Date:  02/25/2023   ID:  Melissa Montes, DOB 09-25-1956, MRN [id number]  PCP:  Margaretann Loveless, MD  Cardiologist:  Adrian Blackwater, MD      History of Present Illness: Melissa Montes is a 67 y.o. female who presents for  Chief Complaint  Patient presents with   Follow-up    3 month follow up    Patient in office for routine cardiac exam. Denies chest pain, shortness of breath, edema, palpitations.     Past Medical History:  Diagnosis Date   ADD (attention deficit disorder)    Anxiety    Aortic aneurysm    Atherosclerosis of abdominal aorta    Bipolar disorder    Bulging of lumbar intervertebral disc    CAD (coronary artery disease)    Cancer    COPD (chronic obstructive pulmonary disease)    Depression    Diverticulitis    Dyspnea    History of kidney stones    Hyperlipidemia    Hypertension    Insomnia    Lumbar spondylosis    MI (myocardial infarction)    2007, 2019   Migraine    Osteoporosis    PONV (postoperative nausea and vomiting)    PVD (peripheral vascular disease)    Saddle thrombus of abdominal aorta    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   SVT (supraventricular tachycardia)      Past Surgical History:  Procedure Laterality Date   ABDOMINAL HYSTERECTOMY     CAROTID ANGIOGRAPHY N/A 01/13/2018   Procedure: CAROTID ANGIOGRAPHY;  Surgeon: Annice Needy, MD;  Location: [facility];  Service: Cardiovascular;  Laterality: N/A;   COLONOSCOPY     CORONARY ANGIOPLASTY WITH STENT PLACEMENT     ENDOVASCULAR REPAIR/STENT GRAFT Bilateral 12/14/2020   Procedure: ENDOVASCULAR REPAIR/STENT GRAFT;  Surgeon: Annice Needy, MD;  Location: [facility];  Service: Cardiovascular;  Laterality: Bilateral;   iv INJECTIONS TO BACK     LEFT HEART CATH Right 07/07/2018   Procedure: Left Heart Cath and Coronary Angiography;  Surgeon: Laurier Nancy, MD;  Location: [facility];  Service:  Cardiovascular;  Laterality: Right;     Current Outpatient Medications  Medication Sig Dispense Refill   aspirin EC 81 MG tablet Take 162 mg by mouth every evening.      atorvastatin (LIPITOR) 80 MG tablet Take 80 mg by mouth every evening.      Buprenorphine HCl 450 MCG FILM Place 450 mcg inside cheek every 12 (twelve) hours as needed (moderate to severe pain .).     Cholecalciferol (VITAMIN D3) 1.25 MG (50000 UT) CAPS Take 1 tablet by mouth once a week.     clopidogrel (PLAVIX) 75 MG tablet Take 1 tablet (75 mg total) by mouth daily. 90 tablet 3   DULoxetine (CYMBALTA) 30 MG capsule TAKE 1 CAPSULE(30 MG) BY MOUTH DAILY 90 capsule 1   ezetimibe (ZETIA) 10 MG tablet TAKE 1 TABLET BY MOUTH EVERY DAY 90 tablet 1   fluticasone (FLONASE) 50 MCG/ACT nasal spray Place 1 spray into both nostrils daily. 9.9 mL 2   gabapentin (NEURONTIN) 300 MG capsule Take 1 capsule (300 mg total) by mouth daily. 90 capsule 1   hydrOXYzine (VISTARIL) 25 MG capsule TAKE 1 CAPSULE BY MOUTH DAILY AS NEEDED FOR ANXIETY AND 2 CAPSULES AT BEDTIME AS NEEDED FOR SLEEP AS DIRECTED 90 capsule 1   metoprolol succinate (TOPROL-XL) 50 MG  24 hr tablet TAKE 1 TABLET BY MOUTH EVERY DAY 90 tablet 1   Multiple Vitamin (MULTIVITAMIN) tablet Take 1 tablet by mouth daily. Centrum Silver for Women     nitroGLYCERIN (NITROSTAT) 0.4 MG SL tablet Place 0.4 mg under the tongue every 5 (five) minutes x 3 doses as needed for chest pain.      Probiotic Product (PROBIOTIC DAILY PO) Take 1 capsule by mouth daily.     promethazine (PHENERGAN) 25 MG tablet Take 25 mg by mouth every 8 (eight) hours as needed for nausea or vomiting.     traMADol HCl 100 MG TABS Take 100 mg by mouth every 8 (eight) hours as needed (moderate to severe pain). 90 tablet 5   traZODone (DESYREL) 100 MG tablet Take 1 tablet (100 mg total) by mouth at bedtime. 90 tablet 1   No current facility-administered medications for this visit.    Allergies:   Effexor xr [venlafaxine  hcl], Penicillins, Amoxicillin-pot clavulanate, and Keflex [cephalexin]    Social History:   reports that she quit smoking about 4 years ago. Her smoking use included cigarettes. She has a 41.00 pack-year smoking history. She has never used smokeless tobacco. She reports that she does not drink alcohol and does not use drugs.   Family History:  family history includes Breast cancer in her maternal aunt; Breast cancer (age of onset: 77) in her mother; Depression in her sister.    ROS:     Review of Systems  Constitutional: Negative.   HENT: Negative.    Eyes: Negative.   Respiratory: Negative.    Cardiovascular: Negative.   Gastrointestinal: Negative.   Genitourinary: Negative.   Musculoskeletal: Negative.   Skin: Negative.   Neurological: Negative.   Endo/Heme/Allergies: Negative.   Psychiatric/Behavioral: Negative.    All other systems reviewed and are negative.   All other systems are reviewed and negative.   PHYSICAL EXAM: VS:  BP 104/74   Pulse 83   Ht 5\' 1"  (1.549 m)   Wt 124 lb 3.2 oz (56.3 kg)   SpO2 93%   BMI 23.47 kg/m  , BMI Body mass index is 23.47 kg/m. Last weight:  Wt Readings from Last 3 Encounters:  02/25/23 124 lb 3.2 oz (56.3 kg)  02/12/23 123 lb 12.8 oz (56.2 kg)  02/01/23 122 lb (55.3 kg)    Physical Exam Constitutional:      Appearance: Normal appearance.  Cardiovascular:     Rate and Rhythm: Normal rate and regular rhythm.     Heart sounds: Normal heart sounds.  Pulmonary:     Effort: Pulmonary effort is normal.     Breath sounds: Normal breath sounds.  Musculoskeletal:     Right lower leg: No edema.     Left lower leg: No edema.  Neurological:     Mental Status: She is alert.      EKG: none today  Recent Labs: 01/04/2023: ALT 17; BUN 17; Creatinine, Ser 0.76; Hemoglobin 14.3; Potassium 4.6; Sodium 140    Lipid Panel    Component Value Date/Time   CHOL 146 01/04/2023 1426   TRIG 82 01/04/2023 1426   HDL 61 01/04/2023 1426    LDLCALC 69 01/04/2023 1426      Other studies Reviewed: Patient: [id number] - Melissa Montes DOB:  1956-03-12  Date:  02/13/2021 13:30 Provider: Adrian Blackwater MD Encounter: ECHO   Page 2 REASON FOR VISIT  Visit for: Echocardiogram/Angine Pectoris  Sex:   female   wt=  102  lbs.  BP= 130/76  Height=  62  inches.   TESTS  Imaging: Echocardiogram:  An echocardiogram in (2-d) mode was performed and in Doppler mode with color flow velocity mapping was performed. The aortic valve cusps are abnormal 2   cm, flow velocity 1.3   m/s, and systolic calculated mean flow gradient 4 mmHg. Mitral valve diastolic peak flow velocity E 0.5    m/s and E/A ratio 1.2. Aortic root diameter 3.2  cm. The LVOT internal diameter 1.8  cm and flow velocity was abnormal 1.0  m/s. LV systolic dimension 2.3  cm, diastolic 3.3  cm, posterior wall thickness 1.02  cm, fractional shortening 30  %, and EF 58%. IVS thickness 1.3  cm. LA dimension 3.2 cm  RIGHT atrium=  10  cm2. Mitral Valve =  Ea=11.2  DT= 165 msec. Tricuspid Valve =  TR jet V=   1.9   RAP=5  RVSP= 21    mmHg. Aortic Valve is Normal. Pulmonic Valve= PIEDV=   0.9   m/s. Mitral Valve has Mild Regurgitation. Pulmonic Valve has Mild Regurgitation. Tricuspid Valve has Mild Regurgitation.     ASSESSMENT  Technically adequate study.  Ejection fraction-58  Left Ventricle- Normal size and function  Left Ventricle diastolic dysfunction grade-Normal diastolic function  Right Ventricle- Normal size/ Mildly decreased function  No wall motion abnormalities  Left Atrium-Normal size  Right Atrium-Normal size  Aortic valve-No stenosis or regurgitation  Pulmonic Valve-No stenosis/ Mild regurgitation  Mitral Valve-Mild regurgitation  Tricuspid Valve-Mild Regurgitation  No Pericardial effusion.     THERAPY   Referring physician: Laurier Nancy  Sonographer: Lenor Derrick.  Adrian Blackwater MD  Electronically signed by: Adrian Blackwater     Date:  02/20/2021 12:16  Patient: [id number] - Alee Bailey DOB:  [birth year]  Date:  02/02/2021 08:00 Provider: Adrian Blackwater MD Encounter: NUCLEAR STRESS TEST   Page 1 TESTS    [facility] [address] [place], [place] [address] [phone] STUDY:  Gated Stress / Rest Myocardial Perfusion Imaging Tomographic (SPECT) Including attenuation correction Wall Motion, Left Ventricular Ejection Fraction By Gated Technique.Persantine Stress Test. SEX: Female   WEIGHT: 101 lbs  HEIGHT: 62 in      ARMS UP: YES/NO                                                                                                                                                                                REFERRING PHYSICIAN:  Dr.Cathlene Gardella [place] Flakes  INDICATION FOR STUDY: Angina Pectoris                                                                                                                                                                                                                   TECHNIQUE:  Approximately 20 minutes following the intravenous administration of 10.2 mCi of Tc-58m Sestamibi after stress testing in a reclined supine position with arms above their head if able to do so, gated SPECT imaging of the heart was performed. After about a 2hr break, the patient was injected intravenously with 31.5 mCi of Tc-74m Sestamibi.  Approximately 45 minutes later in the same position as stress imaging SPECT rest imaging of the heart was performed.  STRESS BY:  Adrian Blackwater, MD PROTOCOL:  Persantine  DOSE ADMIN: 5.2 cc   ROUTE OF ADMINISTRATION: IV                                                                            MAX PRED HR: 155                     85%: 132               75%: 116                                                                                                                    RESTING BP: 118/64 RESTING HR:  66 PEAK BP:  112/60  PEAK HR: 83  EXERCISE DURATION:    4 min injection                                            REASON FOR TEST TERMINATION:    Protocol end                                                                                                                              SYMPTOMS:   None                                                                                                                                                                                                          EKG RESULTS: NSR. 83/min. No significant ST changes with persantine.                                                             IMAGE QUALITY:   Good  PERFUSION/WALL MOTION FINDINGS: EF = 80%. Small mild reversible mid anteroseptal wall defect, normal wall motion.                                                                         IMPRESSION:  Probable normal stress test with normal LVEF.                                                                                                                                                                                                                                                                                       Adrian Blackwater, MD Stress Interpreting Physician / Nuclear Interpreting Physician       Adrian Blackwater MD  Electronically signed by: Adrian Blackwater     Date: 02/13/2021 10:03  Patient: [id number] - Zafirah  Wombles DOB:  08/13/1956  Date:  07/04/2018 14:30 Provider: Adrian Blackwater MD Encounter: ALL ANGIOGRAMS (CTA BRAIN, CAROTIDS, RENAL ARTERIES, PE)   Page 2 REASON FOR VISIT  Referred by Dr.Trayson Stitely [place] Flakes.   TESTS  Imaging: Computed Tomographic Angiography:  Cardiac multidetector CT was performed paying particular attention to the coronary arteries for the diagnosis of: CAD (I25.10). Diagnostic Drugs:  Administered iohexol (Omnipaque) through an antecubital vein and images from the examination were analyzed for the presence and extent of coronary artery disease, using 3D image processing software. 100 mL of non-ionic contrast (Omnipaque) was used.    ASSESSMENT   The proximal right coronary artery (RCA) was abnormal:1  The mid right coronary artery (RCA) was abnormal:2 Stent  The distal right coronary artery (RCA) was abnormal:2  The posterior descending coronary arteries were abnormal:1    Postero-Lateral Branch: 1     TEST CONCLUSIONS  Quality of study: Excellent  1-Calcium score: 1132.2  2-Right dominant system.  3-Stent in mid RCA, no significant disease. High grade distal left main disease. Ostial proximal LAD high grade lesion, advise  CATH as soon as possible.   Adrian BlackwaterShaukat Taneika Choi MD  Electronically signed by: Adrian BlackwaterSHAUKAT Demba Nigh     Date: 07/07/2018 09:14   ASSESSMENT AND PLAN:    ICD-10-CM   1. Benign essential hypertension  I10     2. Coronary artery disease involving native coronary artery of native heart without angina pectoris  I25.10     3. Mixed hyperlipidemia  E78.2        Problem List Items Addressed This Visit       Cardiovascular and Mediastinum   CAD (coronary artery disease)    Denies chest pain, shortness of breath.       Benign essential hypertension - Primary    Well controlled. Continue same medications.         Other   Mixed hyperlipidemia    LDL 01/04/23 was 69. Continue atorvastatin 80 mg daily.        Disposition:   No follow-ups on file.     Total time spent: 30 minutes  Signed,  Adrian BlackwaterShaukat Tyechia Allmendinger, MD  02/25/2023 1:46 PM    [facility]

## 2023-02-26 ENCOUNTER — Other Ambulatory Visit: Payer: Self-pay | Admitting: Family

## 2023-02-28 ENCOUNTER — Ambulatory Visit: Payer: Medicare HMO | Admitting: Cardiovascular Disease

## 2023-03-01 ENCOUNTER — Other Ambulatory Visit: Payer: Self-pay | Admitting: Cardiovascular Disease

## 2023-03-07 DIAGNOSIS — H2513 Age-related nuclear cataract, bilateral: Secondary | ICD-10-CM | POA: Diagnosis not present

## 2023-03-07 DIAGNOSIS — H40003 Preglaucoma, unspecified, bilateral: Secondary | ICD-10-CM | POA: Diagnosis not present

## 2023-03-07 DIAGNOSIS — H02889 Meibomian gland dysfunction of unspecified eye, unspecified eyelid: Secondary | ICD-10-CM | POA: Diagnosis not present

## 2023-03-07 DIAGNOSIS — H4912 Fourth [trochlear] nerve palsy, left eye: Secondary | ICD-10-CM | POA: Diagnosis not present

## 2023-03-12 ENCOUNTER — Other Ambulatory Visit: Payer: Self-pay | Admitting: Cardiovascular Disease

## 2023-03-14 ENCOUNTER — Telehealth: Payer: Self-pay | Admitting: Student in an Organized Health Care Education/Training Program

## 2023-03-14 NOTE — Telephone Encounter (Signed)
Insurance stated that they need a PA for patient Tramadol prescription. [phone]. Please give them a call. TY

## 2023-03-15 NOTE — Telephone Encounter (Signed)
PA done via [facility] 

## 2023-03-26 ENCOUNTER — Encounter: Payer: Self-pay | Admitting: Student in an Organized Health Care Education/Training Program

## 2023-03-26 ENCOUNTER — Ambulatory Visit
Payer: Medicare HMO | Attending: Student in an Organized Health Care Education/Training Program | Admitting: Student in an Organized Health Care Education/Training Program

## 2023-03-26 DIAGNOSIS — M47816 Spondylosis without myelopathy or radiculopathy, lumbar region: Secondary | ICD-10-CM | POA: Insufficient documentation

## 2023-03-26 DIAGNOSIS — G894 Chronic pain syndrome: Secondary | ICD-10-CM | POA: Diagnosis not present

## 2023-03-26 DIAGNOSIS — M5136 Other intervertebral disc degeneration, lumbar region: Secondary | ICD-10-CM | POA: Insufficient documentation

## 2023-03-26 DIAGNOSIS — G8929 Other chronic pain: Secondary | ICD-10-CM | POA: Diagnosis not present

## 2023-03-26 DIAGNOSIS — M75101 Unspecified rotator cuff tear or rupture of right shoulder, not specified as traumatic: Secondary | ICD-10-CM | POA: Insufficient documentation

## 2023-03-26 DIAGNOSIS — M160 Bilateral primary osteoarthritis of hip: Secondary | ICD-10-CM | POA: Diagnosis not present

## 2023-03-26 DIAGNOSIS — M12811 Other specific arthropathies, not elsewhere classified, right shoulder: Secondary | ICD-10-CM | POA: Insufficient documentation

## 2023-03-26 DIAGNOSIS — M25511 Pain in right shoulder: Secondary | ICD-10-CM | POA: Insufficient documentation

## 2023-03-26 MED ORDER — TRAMADOL HCL 100 MG PO TABS
100.0000 mg | ORAL_TABLET | Freq: Two times a day (BID) | ORAL | 2 refills | Status: AC | PRN
Start: 2023-04-10 — End: 2023-07-09

## 2023-03-26 NOTE — Progress Notes (Signed)
Nursing Pain Medication Assessment:  Safety precautions to be maintained throughout the outpatient stay will include: orient to surroundings, keep bed in low position, maintain call bell within reach at all times, provide assistance with transfer out of bed and ambulation.  Medication Inspection Compliance: Pill count conducted under aseptic conditions, in front of the patient. Neither the pills nor the bottle was removed from the patient's sight at any time. Once count was completed pills were immediately returned to the patient in their original bottle.  Medication: Tramadol (Ultram) Pill/Patch Count:  36 of 90 pills remain Pill/Patch Appearance: Markings consistent with prescribed medication Bottle Appearance: Standard pharmacy container. Clearly labeled. Filled Date: 03 / 28 / 2024 Last Medication intake:  Today

## 2023-03-26 NOTE — Progress Notes (Signed)
PROVIDER NOTE: Information contained herein reflects review and annotations entered in association with encounter. Interpretation of such information and data should be left to medically-trained personnel. Information provided to patient can be located elsewhere in the medical record under "Patient Instructions". Document created using STT-dictation technology, any transcriptional errors that may result from process are unintentional.    Patient: Melissa Montes  Service Category: E/M  Provider: Edward Jolly, MD  DOB: 09/11/1956  DOS: 03/26/2023  Referring Provider: Margaretann Loveless, MD  MRN: [id number]  Specialty: Interventional Pain Management  PCP: Margaretann Loveless, MD  Type: Established Patient  Setting: Ambulatory outpatient    Location: Office  Delivery: Face-to-face     HPI  Melissa Montes, a 67 y.o. year old female, is here today because of her No primary diagnosis found.. Melissa Montes's primary complain today is Back Pain (lower) Last encounter: My last encounter with her was on 08/21/22 Pertinent problems: Melissa Montes has S/P coronary artery stent placement; Peripheral vascular disease (HCC); GAD (generalized anxiety disorder); S/P coronary artery bypass graft x 2; Osteoporosis, post-menopausal; Chronic pain syndrome; Lumbar facet arthropathy; Bilateral hip pain; Arthritis of both hips; Chronic left shoulder pain; Right rotator Montes tear arthropathy; Left rotator Montes tear arthropathy; and Lumbar degenerative disc disease on their pertinent problem list. Pain Assessment: Severity of Chronic pain is reported as a 6 /10. Location: Back Lower/buttock bilateral. Onset: More than a month ago. Quality: Aching, Sharp. Timing: Constant. Modifying factor(s): meds, heat. Vitals:  height is 5\' 1"  (1.549 m) and weight is 122 lb (55.3 kg). Her temperature is 97.3 F (36.3 C) (abnormal). Her blood pressure is 121/62 and her pulse is 95. Her respiration is 16 and oxygen saturation is 97%.   Reason  for encounter: medication management.  Increased lumbar spine pain after bending yesterday.  Believes it is a lumbar sprain.  Recommend TENS unit. Refill of tramadol as below, patient taking 100 mg every 12 hours as needed   Pharmacotherapy Assessment  Analgesic: Tramadol 100 mg every 12 hours as needed  Monitoring: [place] PMP: PDMP reviewed during this encounter.       Pharmacotherapy: No side-effects or adverse reactions reported. Compliance: No problems identified. Effectiveness: Clinically acceptable.  Newman Pies, RN  03/26/2023  1:15 PM  Sign when Signing Visit Nursing Pain Medication Assessment:  Safety precautions to be maintained throughout the outpatient stay will include: orient to surroundings, keep bed in low position, maintain call bell within reach at all times, provide assistance with transfer out of bed and ambulation.  Medication Inspection Compliance: Pill count conducted under aseptic conditions, in front of the patient. Neither the pills nor the bottle was removed from the patient's sight at any time. Once count was completed pills were immediately returned to the patient in their original bottle.  Medication: Tramadol (Ultram) Pill/Patch Count:  36 of 90 pills remain Pill/Patch Appearance: Markings consistent with prescribed medication Bottle Appearance: Standard pharmacy container. Clearly labeled. Filled Date: 03 / 28 / 2024 Last Medication intake:  Today    No results found for: "CBDTHCR" No results found for: "D8THCCBX" No results found for: "D9THCCBX"  UDS:  Summary  Date Value Ref Range Status  02/07/2021 Note  Final    Comment:    ==================================================================== [employer] Select 13 (MW) ==================================================================== Test                             Result  Flag       Units  Drug Present and Declared for Prescription Verification   Tramadol                       >2907         EXPECTED   ng/mg creat   O-Desmethyltramadol            >2907        EXPECTED   ng/mg creat   N-Desmethyltramadol            >2907        EXPECTED   ng/mg creat    Source of tramadol is a prescription medication. O-desmethyltramadol    and N-desmethyltramadol are expected metabolites of tramadol.  ==================================================================== Test                      Result    Flag   Units      Ref Range   Creatinine              172              mg/dL      >=96 ==================================================================== Declared Medications:  The flagging and interpretation on this report are based on the  following declared medications.  Unexpected results may arise from  inaccuracies in the declared medications.   **Note: The testing scope of this panel includes these medications:   Tramadol   **Note: The testing scope of this panel does not include the  following reported medications:   Aspirin  Atorvastatin  Clopidogrel  Duloxetine  Ezetimibe  Fluticasone  Hydroxyzine  Meclizine  Metoprolol  Multivitamin  Nitroglycerin  Probiotic  Promethazine  Trazodone  Vitamin D3 ==================================================================== For clinical consultation, please call [phone]. ====================================================================       ROS  Constitutional: Denies any fever or chills Gastrointestinal: No reported hemesis, hematochezia, vomiting, or acute GI distress Musculoskeletal:  Low back pain Neurological: No reported episodes of acute onset apraxia, aphasia, dysarthria, agnosia, amnesia, paralysis, loss of coordination, or loss of consciousness  Medication Review  Buprenorphine HCl, DULoxetine, Probiotic Product, Vitamin D3, aspirin EC, atorvastatin, clopidogrel, ezetimibe, fluticasone, gabapentin, hydrOXYzine, metoprolol succinate, multivitamin, nitroGLYCERIN, promethazine, traMADol HCl, and  traZODone  History Review  Allergy: Melissa Montes is allergic to effexor xr [venlafaxine hcl], penicillins, amoxicillin-pot clavulanate, and keflex [cephalexin]. Drug: Melissa Montes  reports no history of drug use. Alcohol:  reports no history of alcohol use. Tobacco:  reports that she quit smoking about 4 years ago. Her smoking use included cigarettes. She has a 41.00 pack-year smoking history. She has never used smokeless tobacco. Social: Melissa Montes  reports that she quit smoking about 4 years ago. Her smoking use included cigarettes. She has a 41.00 pack-year smoking history. She has never used smokeless tobacco. She reports that she does not drink alcohol and does not use drugs. Medical:  has a past medical history of ADD (attention deficit disorder), Anxiety, Aortic aneurysm (HCC), Atherosclerosis of abdominal aorta (HCC), Bipolar disorder (HCC), Bulging of lumbar intervertebral disc, CAD (coronary artery disease), Cancer (HCC), COPD (chronic obstructive pulmonary disease) (HCC), Depression, Diverticulitis, Dyspnea, History of kidney stones, Hyperlipidemia, Hypertension, Insomnia, Lumbar spondylosis, MI (myocardial infarction) (HCC), Migraine, Osteoporosis, PONV (postoperative nausea and vomiting), PVD (peripheral vascular disease) (HCC), 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HCC), Status post double vessel coronary artery bypass (07/16/2018), and SVT (supraventricular tachycardia). Surgical: Melissa Montes  has a past surgical history that includes Coronary angioplasty with  stent; Abdominal hysterectomy; CAROTID ANGIOGRAPHY (N/A, 01/13/2018); Left Heart Cath (Right, 07/07/2018); Colonoscopy; iv INJECTIONS TO BACK; and ENDOVASCULAR REPAIR/STENT GRAFT (Bilateral, 12/14/2020). Family: family history includes Breast cancer in her maternal aunt; Breast cancer (age of onset: 53) in her mother; Depression in her sister.  Laboratory Chemistry Profile   Renal Lab Results  Component Value Date   BUN 17  01/04/2023   CREATININE 0.76 01/04/2023   BCR 22 01/04/2023   GFRAA >60 07/17/2020   GFRNONAA >60 04/28/2021    Hepatic Lab Results  Component Value Date   AST 20 01/04/2023   ALT 17 01/04/2023   ALBUMIN 4.3 01/04/2023   ALKPHOS 135 (H) 01/04/2023    Electrolytes Lab Results  Component Value Date   NA 140 01/04/2023   K 4.6 01/04/2023   CL 103 01/04/2023   CALCIUM 9.3 01/04/2023    Bone No results found for: "VD25OH", "VD125OH2TOT", "UE4540JW1", "XB1478GN5", "25OHVITD1", "25OHVITD2", "25OHVITD3", "TESTOFREE", "TESTOSTERONE"  Inflammation (CRP: Acute Phase) (ESR: Chronic Phase) Lab Results  Component Value Date   ESRSEDRATE 8 01/04/2023         Note: Above Lab results reviewed.  Recent Imaging Review  VAS US CAROTID Carotid Arterial Duplex Study  Patient Name:  TAIONNA MYLIN [facility]  Date of Exam:   02/12/2023 Medical Rec #: [id number]            Accession #:    [id number] Date of Birth: 1956/04/23            Patient Gender: F Patient Age:   10 years Exam Location:  [facility] Procedure:      VAS US CAROTID Referring Phys: Sheppard Plumber  --------------------------------------------------------------------------------   Indications:  Carotid artery disease. Risk Factors: Hypertension, hyperlipidemia, past history of smoking, coronary               artery disease.  Performing Technologist: Hardie Lora RVT    Examination Guidelines: A complete evaluation includes B-mode imaging, spectral Doppler, color Doppler, and power Doppler as needed of all accessible portions of each vessel. Bilateral testing is considered an integral part of a complete examination. Limited examinations for reoccurring indications may be performed as noted.    Right Carotid Findings: +----------+--------+--------+--------+------------------+--------+           PSV cm/sEDV cm/sStenosisPlaque  DescriptionComments +----------+--------+--------+--------+------------------+--------+ CCA Prox  85      21                                         +----------+--------+--------+--------+------------------+--------+ CCA Mid   85      20                                         +----------+--------+--------+--------+------------------+--------+ CCA Distal58      13                                         +----------+--------+--------+--------+------------------+--------+ ICA Prox  62      17      1-39%   diffuse                    +----------+--------+--------+--------+------------------+--------+ ICA Mid   78      29                                         +----------+--------+--------+--------+------------------+--------+  ICA Distal84      30                                         +----------+--------+--------+--------+------------------+--------+ ECA       101     16                                         +----------+--------+--------+--------+------------------+--------+  +----------+--------+-------+----------------+-------------------+           PSV cm/sEDV cmsDescribe        Arm Pressure (mmHG) +----------+--------+-------+----------------+-------------------+ [id number]            Multiphasic, WNL                    +----------+--------+-------+----------------+-------------------+  +---------+--------+--+--------+--+---------+ VertebralPSV cm/s43EDV cm/s12Antegrade +---------+--------+--+--------+--+---------+     Left Carotid Findings: +----------+--------+--------+--------+------------------+--------+           PSV cm/sEDV cm/sStenosisPlaque DescriptionComments +----------+--------+--------+--------+------------------+--------+ CCA Prox  82      19                                         +----------+--------+--------+--------+------------------+--------+ CCA Mid   76      21                                          +----------+--------+--------+--------+------------------+--------+ CCA Distal78      21                                         +----------+--------+--------+--------+------------------+--------+ ICA Prox  121     28      1-39%   calcific and focal         +----------+--------+--------+--------+------------------+--------+ ICA Mid   73      26                                         +----------+--------+--------+--------+------------------+--------+ ICA Distal72      25                                         +----------+--------+--------+--------+------------------+--------+ ECA       103     11                                         +----------+--------+--------+--------+------------------+--------+  +----------+--------+--------+----------------+-------------------+           PSV cm/sEDV cm/sDescribe        Arm Pressure (mmHG) +----------+--------+--------+----------------+-------------------+ Subclavian89              Multiphasic, WNL                    +----------+--------+--------+----------------+-------------------+  +---------+--------+--+--------+--+---------+ VertebralPSV cm/s55EDV cm/s15Antegrade +---------+--------+--+--------+--+---------+  Summary: Right Carotid: Velocities in the right ICA are consistent with a 1-39% stenosis.  Left Carotid: Velocities in the left ICA are consistent with a 1-39% stenosis.  Vertebrals:  Bilateral vertebral arteries demonstrate antegrade flow. Subclavians: Normal flow hemodynamics were seen in bilateral subclavian              arteries.  *See table(s) above for measurements and observations.    Electronically signed by Festus Barren MD on 02/13/2023 at 1:16:49 PM.      Final   VAS [place] EVAR DUPLEX Endovascular Aortic Repair Study (EVAR)  Patient Name:  SHAREENA WILLENBRINK [facility]  Date of Exam:   02/12/2023 Medical Rec #: [id number]             Accession #:    [id number] Date of Birth: 05-23-1956            Patient Gender: F Patient Age:   66 years Exam Location:  [facility] Procedure:      VAS [place] EVAR DUPLEX Referring Phys: Sheppard Plumber  --------------------------------------------------------------------------------   Indications: Follow up exam for EVAR. Surgery date 12/14/2020.  Risk Factors: Past history of smoking.  Vascular Interventions: EVAR on 12/14/20.    Performing Technologist: Hardie Lora RVT    Examination Guidelines: A complete evaluation includes B-mode imaging, spectral Doppler, color Doppler, and power Doppler as needed of all accessible portions of each vessel. Bilateral testing is considered an integral part of a complete examination. Limited examinations for reoccurring indications may be performed as noted.    Abdominal Aorta Findings: +--------+-------+----------+----------+--------+--------+--------+ LocationAP (cm)Trans (cm)PSV (cm/s)WaveformThrombusComments +--------+-------+----------+----------+--------+--------+--------+ Proximal1.64   1.74      56                                 +--------+-------+----------+----------+--------+--------+--------+  Endovascular Aortic Repair (EVAR): +----------+----------------+-------------------+-------------------+           Diameter AP (cm)Diameter Trans (cm)Velocities (cm/sec) +----------+----------------+-------------------+-------------------+ Aorta     3.44            3.61               24                  +----------+----------------+-------------------+-------------------+ Right Limb1.20            1.29               151                 +----------+----------------+-------------------+-------------------+ Left Limb 0.90            1.03               161                 +----------+----------------+-------------------+-------------------+  +-------------+-----------------------+ Endoleak  TypeNo evidence of endoleak +-------------+-----------------------+     Summary: Abdominal Aorta: Patent endovascular aneurysm repair with no evidence of endoleak. The largest aortic diameter remains essentially unchanged compared to prior exam. Previous diameter measurement was 3.6 cm obtained on 02/16/2022.   *See table(s) above for measurements and observations.   Electronically signed by Festus Barren MD on 02/13/2023 at 1:16:40 PM.       Final   Note: Reviewed        Physical Exam  General appearance: Well nourished, well developed, and well hydrated. In no apparent acute distress Mental status: Alert, oriented x 3 (person, place, & time)  Respiratory: No evidence of acute respiratory distress Eyes: PERLA Vitals: BP 121/62   Pulse 95   Temp (!) 97.3 F (36.3 C)   Resp 16   Ht 5\' 1"  (1.549 m)   Wt 122 lb (55.3 kg)   SpO2 97%   BMI 23.05 kg/m  BMI: Estimated body mass index is 23.05 kg/m as calculated from the following:   Height as of this encounter: 5\' 1"  (1.549 m).   Weight as of this encounter: 122 lb (55.3 kg). Ideal: Ideal body weight: 47.8 kg (105 lb 6.1 oz) Adjusted ideal body weight: 50.8 kg (112 lb 0.4 oz)  Lumbar Spine Area Exam  Skin & Axial Inspection: No masses, redness, or swelling Alignment: Symmetrical Functional ROM: Pain restricted ROM affecting both sides Stability: No instability detected Muscle Tone/Strength: Functionally intact. No obvious neuro-muscular anomalies detected. Sensory (Neurological): Musculoskeletal pain pattern  Gait & Posture Assessment  Ambulation: Unassisted Gait: Relatively normal for age and body habitus Posture: WNL  Lower Extremity Exam    Side: Right lower extremity  Side: Left lower extremity  Stability: No instability observed          Stability: No instability observed          Skin & Extremity Inspection: Skin color, temperature, and hair growth are WNL. No peripheral edema or cyanosis. No masses, redness,  swelling, asymmetry, or associated skin lesions. No contractures.  Skin & Extremity Inspection: Skin color, temperature, and hair growth are WNL. No peripheral edema or cyanosis. No masses, redness, swelling, asymmetry, or associated skin lesions. No contractures.  Functional ROM: Unrestricted ROM                  Functional ROM: Unrestricted ROM                  Muscle Tone/Strength: Functionally intact. No obvious neuro-muscular anomalies detected.  Muscle Tone/Strength: Functionally intact. No obvious neuro-muscular anomalies detected.  Sensory (Neurological): Unimpaired        Sensory (Neurological): Unimpaired        DTR: Patellar: deferred today Achilles: deferred today Plantar: deferred today  DTR: Patellar: deferred today Achilles: deferred today Plantar: deferred today  Palpation: No palpable anomalies  Palpation: No palpable anomalies   Assessment   Diagnosis Status  1. Lumbar spondylosis   2. Chronic pain syndrome   3. Chronic right shoulder pain   4. Lumbar facet arthropathy   5. Primary osteoarthritis of both hips   6. Lumbar degenerative disc disease   7. Right rotator Montes tear arthropathy    Controlled Controlled Controlled      Plan of Care   Melissa Montes has a current medication list which includes the following long-term medication(s): atorvastatin, duloxetine, ezetimibe, fluticasone, gabapentin, metoprolol succinate, nitroglycerin, promethazine, and trazodone.    Pharmacotherapy (Medications Ordered): Meds ordered this encounter  Medications   traMADol HCl 100 MG TABS    Sig: Take 100 mg by mouth every 12 (twelve) hours as needed (moderate to severe pain).    Dispense:  60 tablet    Refill:  2  -Recommend TENS unit to her lower back   Orders:  No orders of the defined types were placed in this encounter.  Follow-up plan:   Return in about 15 weeks (around 07/09/2023) for Medication Management, in person.    Recent Visits No visits  were found meeting these conditions. Showing recent visits within past 90 days and meeting all other requirements Today's Visits Date Type Provider Dept  03/26/23 Office Visit Edward Jolly, MD [facility]  Showing today's visits and meeting all other requirements Future Appointments No visits were found meeting these conditions. Showing future appointments within next 90 days and meeting all other requirements  I discussed the assessment and treatment plan with the patient. The patient was provided an opportunity to ask questions and all were answered. The patient agreed with the plan and demonstrated an understanding of the instructions.  Patient advised to call back or seek an in-person evaluation if the symptoms or condition worsens.  Duration of encounter: .  Total time on encounter, as per AMA guidelines included both the face-to-face and non-face-to-face time personally spent by the physician and/or other qualified health care professional(s) on the day of the encounter (includes time in activities that require the physician or other qualified health care professional and does not include time in activities normally performed by clinical staff). Physician's time may include the following activities when performed: preparing to see the patient (eg, review of tests, pre-charting review of records) obtaining and/or reviewing separately obtained history performing a medically appropriate examination and/or evaluation counseling and educating the patient/family/caregiver ordering medications, tests, or procedures referring and communicating with other health care professionals (when not separately reported) documenting clinical information in the electronic or other health record independently interpreting results (not separately reported) and communicating results to the patient/ family/caregiver care coordination (not separately reported)  Note by: Edward Jolly,  MD Date: 03/26/2023; Time: 1:51 PM

## 2023-04-27 ENCOUNTER — Other Ambulatory Visit: Payer: Self-pay | Admitting: Psychiatry

## 2023-04-27 DIAGNOSIS — F411 Generalized anxiety disorder: Secondary | ICD-10-CM

## 2023-04-27 DIAGNOSIS — F3176 Bipolar disorder, in full remission, most recent episode depressed: Secondary | ICD-10-CM

## 2023-05-01 ENCOUNTER — Other Ambulatory Visit: Payer: Self-pay | Admitting: Acute Care

## 2023-05-01 DIAGNOSIS — Z122 Encounter for screening for malignant neoplasm of respiratory organs: Secondary | ICD-10-CM

## 2023-05-01 DIAGNOSIS — Z87891 Personal history of nicotine dependence: Secondary | ICD-10-CM

## 2023-05-06 ENCOUNTER — Ambulatory Visit: Payer: Medicare HMO | Admitting: Internal Medicine

## 2023-06-06 ENCOUNTER — Other Ambulatory Visit: Payer: Self-pay | Admitting: Cardiovascular Disease

## 2023-06-21 ENCOUNTER — Other Ambulatory Visit: Payer: Self-pay | Admitting: Internal Medicine

## 2023-06-21 MED ORDER — ATORVASTATIN CALCIUM 80 MG PO TABS: 80.0000 mg | ORAL_TABLET | Freq: Every day | ORAL | 0 refills | Status: DC

## 2023-06-21 MED ORDER — METOPROLOL SUCCINATE ER 50 MG PO TB24: 50.0000 mg | ORAL_TABLET | Freq: Every day | ORAL | 1 refills | Status: DC

## 2023-07-04 ENCOUNTER — Encounter: Payer: Medicare HMO | Admitting: Student in an Organized Health Care Education/Training Program

## 2023-07-08 ENCOUNTER — Ambulatory Visit: Payer: Medicare HMO

## 2023-07-27 ENCOUNTER — Other Ambulatory Visit: Payer: Self-pay | Admitting: Psychiatry

## 2023-07-27 DIAGNOSIS — F411 Generalized anxiety disorder: Secondary | ICD-10-CM

## 2023-07-27 DIAGNOSIS — F3176 Bipolar disorder, in full remission, most recent episode depressed: Secondary | ICD-10-CM

## 2023-07-28 DIAGNOSIS — Z719 Counseling, unspecified: Secondary | ICD-10-CM | POA: Diagnosis not present

## 2023-07-28 DIAGNOSIS — H109 Unspecified conjunctivitis: Secondary | ICD-10-CM | POA: Diagnosis not present

## 2023-07-28 DIAGNOSIS — R4589 Other symptoms and signs involving emotional state: Secondary | ICD-10-CM | POA: Diagnosis not present

## 2023-08-01 ENCOUNTER — Telehealth (INDEPENDENT_AMBULATORY_CARE_PROVIDER_SITE_OTHER): Payer: Medicare HMO | Admitting: Psychiatry

## 2023-08-01 ENCOUNTER — Encounter: Payer: Self-pay | Admitting: Psychiatry

## 2023-08-01 ENCOUNTER — Telehealth: Payer: Self-pay

## 2023-08-01 DIAGNOSIS — F3176 Bipolar disorder, in full remission, most recent episode depressed: Secondary | ICD-10-CM | POA: Diagnosis not present

## 2023-08-01 DIAGNOSIS — F411 Generalized anxiety disorder: Secondary | ICD-10-CM

## 2023-08-01 DIAGNOSIS — G4701 Insomnia due to medical condition: Secondary | ICD-10-CM | POA: Diagnosis not present

## 2023-08-01 MED ORDER — DULOXETINE HCL 30 MG PO CPEP: ORAL_CAPSULE | ORAL | 1 refills | Status: DC

## 2023-08-01 MED ORDER — TRAZODONE HCL 100 MG PO TABS
100.0000 mg | ORAL_TABLET | Freq: Every day | ORAL | 1 refills | Status: DC
Start: 2023-08-01 — End: 2024-02-06

## 2023-08-01 MED ORDER — GABAPENTIN 300 MG PO CAPS: 300.0000 mg | ORAL_CAPSULE | Freq: Every day | ORAL | 1 refills | Status: DC

## 2023-08-01 MED ORDER — HYDROXYZINE PAMOATE 25 MG PO CAPS: ORAL_CAPSULE | ORAL | 1 refills | Status: DC

## 2023-08-01 NOTE — Telephone Encounter (Signed)
Please contact pharmacy and let them know a script was sent out and received by them - dated 04/28/2023- 90 days with 1 refill. Please let me know once you verify this.

## 2023-08-01 NOTE — Telephone Encounter (Signed)
Patient requesting a refill of   gabapentin (NEURONTIN) 300 MG capsule   Preferred pharmacy  [id number] - [place], [place] - [address] AT [facility] OF [address] [phone]

## 2023-08-01 NOTE — Telephone Encounter (Signed)
I have sent another prescription for gabapentin to pharmacy.

## 2023-08-01 NOTE — Progress Notes (Signed)
Virtual Visit via Video Note  I connected with Melissa Montes on 08/01/23 at  3:00 PM EDT by a video enabled telemedicine application and verified that I am speaking with the correct person using two identifiers.  Location Provider Location : [facility] Patient Location : Home  Participants: Patient , Provider    I discussed the limitations of evaluation and management by telemedicine and the availability of in person appointments. The patient expressed understanding and agreed to proceed.  I discussed the assessment and treatment plan with the patient. The patient was provided an opportunity to ask questions and all were answered. The patient agreed with the plan and demonstrated an understanding of the instructions.   The patient was advised to call back or seek an in-person evaluation if the symptoms worsen or if the condition fails to improve as anticipated.   BH MD OP Progress Note  08/01/2023 3:20 PM Melissa Montes  MRN:  [id number]  Chief Complaint:  Chief Complaint  Patient presents with   Follow-up   Anxiety   Depression   Medication Refill   HPI: Melissa Montes is a 67 year old Caucasian female, on disability, divorced, lives in [place], has a history of bipolar disorder, GAD, diverticulitis, abdominal aortic aneurysm, chronic pain was evaluated by telemedicine today.  Patient today reports she is currently doing fairly well on the current medication regimen.  She denies any significant mood lability, irritability, sadness, crying spells.  Patient reports sleep as good.  She does continue to struggle with pain, currently takes tramadol.  Patient denies any suicidality, homicidality or perceptual disturbances.  She does struggle with double vision likely due to her history of stroke.  She reports she had follow-up with ophthalmology recently.  She reports they are going to change her prescription glasses slightly to help her with that.  She continues to drive  although short distances only.  Patient denies any other concerns today.  Visit Diagnosis:    ICD-10-CM   1. GAD (generalized anxiety disorder)  F41.1 hydrOXYzine (VISTARIL) 25 MG capsule    DULoxetine (CYMBALTA) 30 MG capsule    2. Bipolar disorder, in full remission, most recent episode depressed (HCC)  F31.76     3. Insomnia due to medical condition  G47.01 traZODone (DESYREL) 100 MG tablet   Pain      Past Psychiatric History: I have reviewed past psychiatric history from progress note on 04/26/2020.  Past Medical History:  Past Medical History:  Diagnosis Date   ADD (attention deficit disorder)    Anxiety    Aortic aneurysm (HCC)    Atherosclerosis of abdominal aorta (HCC)    Bipolar disorder (HCC)    Bulging of lumbar intervertebral disc    CAD (coronary artery disease)    Cancer (HCC)    COPD (chronic obstructive pulmonary disease) (HCC)    Depression    Diverticulitis    Dyspnea    History of kidney stones    Hyperlipidemia    Hypertension    Insomnia    Lumbar spondylosis    MI (myocardial infarction) (HCC)    2007, 2019   Migraine    Osteoporosis    PONV (postoperative nausea and vomiting)    PVD (peripheral vascular disease) (HCC)    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HCC)    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   SVT (supraventricular tachycardia)     Past Surgical History:  Procedure Laterality Date   ABDOMINAL HYSTERECTOMY     CAROTID  ANGIOGRAPHY N/A 01/13/2018   Procedure: CAROTID ANGIOGRAPHY;  Surgeon: Annice Needy, MD;  Location: [facility];  Service: Cardiovascular;  Laterality: N/A;   COLONOSCOPY     CORONARY ANGIOPLASTY WITH STENT PLACEMENT     ENDOVASCULAR REPAIR/STENT GRAFT Bilateral 12/14/2020   Procedure: ENDOVASCULAR REPAIR/STENT GRAFT;  Surgeon: Annice Needy, MD;  Location: [facility];  Service: Cardiovascular;  Laterality: Bilateral;   iv INJECTIONS TO BACK     LEFT HEART CATH Right 07/07/2018    Procedure: Left Heart Cath and Coronary Angiography;  Surgeon: Laurier Nancy, MD;  Location: [facility];  Service: Cardiovascular;  Laterality: Right;    Family Psychiatric History: I have reviewed family psychiatric history from progress note on 04/26/2020.  Family History:  Family History  Problem Relation Age of Onset   Breast cancer Mother 11   Breast cancer Maternal Aunt        mat aunt and great aunt   Depression Sister     Social History: I have reviewed social history from progress note on 04/26/2020. Social History   Socioeconomic History   Marital status: Divorced    Spouse name: Not on file   Number of children: Not on file   Years of education: Not on file   Highest education level: Not on file  Occupational History   Occupation: disabled  Tobacco Use   Smoking status: Former    Current packs/day: 0.00    Average packs/day: 1 pack/day for 41.0 years (41.0 ttl pk-yrs)    Types: Cigarettes    Start date: 09/19/1977    Quit date: 09/19/2018    Years since quitting: 4.8   Smokeless tobacco: Never   Tobacco comments:    uses nicotine gums  Substance and Sexual Activity   Alcohol use: No   Drug use: No   Sexual activity: Not on file  Other Topics Concern   Not on file  Social History Narrative   Not on file   Social Determinants of Health   Financial Resource Strain: Not on file  Food Insecurity: Not on file  Transportation Needs: Not on file  Physical Activity: Not on file  Stress: Not on file  Social Connections: Not on file    Allergies:  Allergies  Allergen Reactions   Effexor Xr [Venlafaxine Hcl]     suicidality   Penicillins Swelling   Amoxicillin-Pot Clavulanate    Keflex [Cephalexin] Hives    Metabolic Disorder Labs: No results found for: "HGBA1C", "MPG" No results found for: "PROLACTIN" Lab Results  Component Value Date   CHOL 146 01/04/2023   TRIG 82 01/04/2023   HDL 61 01/04/2023   LDLCALC 69 01/04/2023   No results  found for: "TSH"  Therapeutic Level Labs: No results found for: "LITHIUM" No results found for: "VALPROATE" No results found for: "CBMZ"  Current Medications: Current Outpatient Medications  Medication Sig Dispense Refill   aspirin EC 81 MG tablet Take 162 mg by mouth every evening.      atorvastatin (LIPITOR) 80 MG tablet Take 1 tablet (80 mg total) by mouth at bedtime. 90 tablet 0   Cholecalciferol (VITAMIN D3) 1.25 MG (50000 UT) capsule TAKE 1 CAPSULE BY MOUTH 1 TIME WEEKLY 12 capsule 3   clopidogrel (PLAVIX) 75 MG tablet TAKE 1 TABLET BY MOUTH EVERY DAY 90 tablet 1   ezetimibe (ZETIA) 10 MG tablet TAKE 1 TABLET BY MOUTH EVERY DAY 90 tablet 1   fluticasone (FLONASE) 50 MCG/ACT nasal  spray Place 1 spray into both nostrils daily. 9.9 mL 2   gabapentin (NEURONTIN) 300 MG capsule TAKE 1 CAPSULE(300 MG) BY MOUTH DAILY 90 capsule 1   metoprolol succinate (TOPROL-XL) 50 MG 24 hr tablet Take 1 tablet (50 mg total) by mouth daily. 90 tablet 1   Multiple Vitamin (MULTIVITAMIN) tablet Take 1 tablet by mouth daily. Centrum Silver for Women     nitroGLYCERIN (NITROSTAT) 0.4 MG SL tablet Place 0.4 mg under the tongue every 5 (five) minutes x 3 doses as needed for chest pain.      Probiotic Product (PROBIOTIC DAILY PO) Take 1 capsule by mouth daily.     promethazine (PHENERGAN) 25 MG tablet Take 25 mg by mouth every 8 (eight) hours as needed for nausea or vomiting.     traMADol HCl 100 MG TABS Take by mouth.     Buprenorphine HCl 450 MCG FILM Place 450 mcg inside cheek every 12 (twelve) hours as needed (moderate to severe pain .). (Patient not taking: Reported on 08/01/2023)     DULoxetine (CYMBALTA) 30 MG capsule TAKE 1 CAPSULE(30 MG) BY MOUTH DAILY 90 capsule 1   hydrOXYzine (VISTARIL) 25 MG capsule TAKE 1 CAPSULE BY MOUTH DAILY AS NEEDED FOR ANXIETY AND 2 CAPSULES AT BEDTIME AS NEEDED FOR SLEEP AS DIRECTED 90 capsule 1   traZODone (DESYREL) 100 MG tablet Take 1 tablet (100 mg total) by mouth at  bedtime. 90 tablet 1   No current facility-administered medications for this visit.     Musculoskeletal: Strength & Muscle Tone:  UTA Gait & Station:  Seated Patient leans: N/A  Psychiatric Specialty Exam: Review of Systems  Musculoskeletal:  Positive for back pain.  Psychiatric/Behavioral: Negative.      There were no vitals taken for this visit.There is no height or weight on file to calculate BMI.  General Appearance: Fairly Groomed  Eye Contact:  Good  Speech:  Clear and Coherent  Volume:  Normal  Mood:  Euthymic  Affect:  Congruent  Thought Process:  Goal Directed and Descriptions of Associations: Intact  Orientation:  Full (Time, Place, and Person)  Thought Content: Logical   Suicidal Thoughts:  No  Homicidal Thoughts:  No  Memory:  Immediate;   Fair Recent;   Fair Remote;   Fair  Judgement:  Fair  Insight:  Fair  Psychomotor Activity:  Normal  Concentration:  Concentration: Good and Attention Span: Fair  Recall:  [employer] of Knowledge: Fair  Language: Fair  Akathisia:  No  Handed:  Right  AIMS (if indicated): not done  Assets:  Communication Skills Desire for Improvement Housing Social Support  ADL's:  Intact  Cognition: WNL  Sleep:  Fair   Screenings: AIMS    Flowsheet Row Video Visit from 02/14/2022 in [facility] Office Visit from 01/16/2022 in [facility]  AIMS Total Score 0 0      GAD-7    Flowsheet Row Office Visit from 09/05/2022 in [facility] Video Visit from 04/17/2022 in [facility] Video Visit from 02/14/2022 in [facility] Video Visit from 04/26/2021 in [facility]  Total GAD-7 Score 3 1 1 1       PHQ2-9    Flowsheet Row Video Visit from 08/01/2023 in [facility] Video Visit from 02/19/2023 in [facility] Office Visit from  09/05/2022 in [facility] Office Visit from 08/02/2022 in [facility] Visit from 06/28/2022 in [facility] Total Score 0 0 2 0 0  PHQ-9 Total Score -- -- 5 -- --      Flowsheet Row Video Visit from 08/01/2023 in [facility] Video Visit from 02/19/2023 in [facility] Office Visit from 09/05/2022 in [facility]  C-SSRS RISK CATEGORY No Risk No Risk No Risk        Assessment and Plan: Melissa Montes is a 67 year old Caucasian female, divorced, disabled, lives in [place], has a history of bipolar disorder, GAD, aortic aneurysm status post recent repair, diverticulitis was evaluated by telemedicine today.  Patient is currently stable.  Plan GAD-stable Cymbalta 30 mg p.o. daily Hydroxyzine 25 mg p.o. daily,1 capsule during the day and 2 capsules at bedtime as needed for severe anxiety, sleep Gabapentin 300 mg p.o. nightly Patient aware of drug to drug interaction between tramadol and medications like Cymbalta, trazodone.  Bipolar disorder in remission Gabapentin 300 mg p.o. nightly  Insomnia-stable Trazodone 100 mg p.o. nightly as needed Hydroxyzine 25 mg p.o. nightly as needed     Consent: Patient/Guardian gives verbal consent for treatment and assignment of benefits for services provided during this visit. Patient/Guardian expressed understanding and agreed to proceed.   Follow-up in clinic in 6 months or sooner in person.  This note was generated in part or whole with voice recognition software. Voice recognition is usually quite accurate but there are transcription errors  that can and very often do occur. I apologize for any typographical errors that were not detected and corrected.    Jomarie Longs, MD 08/01/2023, 3:20 PM

## 2023-08-01 NOTE — Telephone Encounter (Signed)
I already contacted the pharmacy and spoke to [place] she stated that she did not have any refills on file

## 2023-08-02 NOTE — Telephone Encounter (Signed)
Spoke to patient she stated that she got a notification from the pharmacy that she has 2 ready for pick up and one is too soon to fill and another has an insurance issue she was not able to tell me which medication she stated that she would go to the pharmacy today and will give the office a call on Monday if she has any other issues

## 2023-08-02 NOTE — Telephone Encounter (Signed)
Thank you :)

## 2023-08-13 ENCOUNTER — Ambulatory Visit
Payer: Medicare HMO | Attending: Student in an Organized Health Care Education/Training Program | Admitting: Student in an Organized Health Care Education/Training Program

## 2023-08-13 ENCOUNTER — Encounter: Payer: Self-pay | Admitting: Student in an Organized Health Care Education/Training Program

## 2023-08-13 DIAGNOSIS — M47818 Spondylosis without myelopathy or radiculopathy, sacral and sacrococcygeal region: Secondary | ICD-10-CM | POA: Insufficient documentation

## 2023-08-13 DIAGNOSIS — M533 Sacrococcygeal disorders, not elsewhere classified: Secondary | ICD-10-CM | POA: Diagnosis not present

## 2023-08-13 DIAGNOSIS — G894 Chronic pain syndrome: Secondary | ICD-10-CM | POA: Insufficient documentation

## 2023-08-13 DIAGNOSIS — G5703 Lesion of sciatic nerve, bilateral lower limbs: Secondary | ICD-10-CM | POA: Diagnosis not present

## 2023-08-13 DIAGNOSIS — M47816 Spondylosis without myelopathy or radiculopathy, lumbar region: Secondary | ICD-10-CM | POA: Diagnosis not present

## 2023-08-13 MED ORDER — TRAMADOL HCL 100 MG PO TABS: 1.0000 | ORAL_TABLET | Freq: Two times a day (BID) | ORAL | 2 refills | Status: DC | PRN

## 2023-08-13 MED ORDER — TRAMADOL HCL 100 MG PO TABS: 1.0000 | ORAL_TABLET | Freq: Two times a day (BID) | ORAL | 5 refills | Status: DC | PRN

## 2023-08-13 NOTE — Progress Notes (Signed)
CALCIUM 9.3 01/04/2023    Bone No results found for: "VD25OH", "VD125OH2TOT", "VO5366YQ0", "HK7425ZD6", "25OHVITD1", "25OHVITD2", "25OHVITD3", "TESTOFREE", "TESTOSTERONE"  Inflammation (CRP: Acute Phase) (ESR: Chronic Phase) Lab Results  Component Value Date   ESRSEDRATE 8 01/04/2023         Note: Above Lab results reviewed.  Recent Imaging Review  VAS US CAROTID Carotid Arterial Duplex Study  Patient Name:  Melissa Montes [facility]  Date of Exam:   02/12/2023 Medical Rec #: [id number]             Accession #:    [id number] Date of Birth: Oct 14, 1956            Patient Gender: F Patient Age:   67 years Exam Location:  [facility] Procedure:      VAS US CAROTID Referring Phys: Sheppard Plumber  --------------------------------------------------------------------------------   Indications:  Carotid artery disease. Risk Factors: Hypertension, hyperlipidemia, past history of smoking, coronary               artery disease.  Performing Technologist: Hardie Lora RVT    Examination Guidelines: A complete evaluation includes B-mode imaging, spectral Doppler, color Doppler, and power Doppler as needed of all accessible portions of each vessel. Bilateral testing is considered an integral part of a complete examination. Limited examinations for reoccurring indications may be performed as noted.    Right Carotid Findings: +----------+--------+--------+--------+------------------+--------+           PSV cm/sEDV cm/sStenosisPlaque DescriptionComments +----------+--------+--------+--------+------------------+--------+ CCA Prox  85      21                                         +----------+--------+--------+--------+------------------+--------+ CCA Mid   85      20                                         +----------+--------+--------+--------+------------------+--------+ CCA Distal58      13                                         +----------+--------+--------+--------+------------------+--------+ ICA Prox  62      17      1-39%   diffuse                    +----------+--------+--------+--------+------------------+--------+ ICA Mid   78      29                                         +----------+--------+--------+--------+------------------+--------+ ICA Distal84      30                                         +----------+--------+--------+--------+------------------+--------+ ECA       101     16                                          +----------+--------+--------+--------+------------------+--------+  +----------+--------+-------+----------------+-------------------+  PROVIDER NOTE: Information contained herein reflects review and annotations entered in association with encounter. Interpretation of such information and data should be left to medically-trained personnel. Information provided to patient can be located elsewhere in the medical record under "Patient Instructions". Document created using STT-dictation technology, any transcriptional errors that may result from process are unintentional.    Patient: Melissa Montes  Service Category: E/M  Provider: Edward Jolly, MD  DOB: 02-10-1956  DOS: 08/13/2023  Referring Provider: Margaretann Loveless, MD  MRN: [id number]  Specialty: Interventional Pain Management  PCP: Margaretann Loveless, MD  Type: Established Patient  Setting: Ambulatory outpatient    Location: Office  Delivery: Face-to-face     HPI  Melissa Montes, a 67 y.o. 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 Melissa Montes's primary complain today is buttock pain Last encounter: My last encounter with her was on 03/26/23 Pertinent problems: Melissa Montes has S/P coronary artery stent placement; Peripheral vascular disease (HCC); GAD (generalized anxiety disorder); S/P coronary artery bypass graft x 2; Osteoporosis, post-menopausal; Chronic pain syndrome; Lumbar facet arthropathy; Bilateral hip pain; Arthritis of both hips; Chronic left shoulder pain; Right rotator cuff tear arthropathy; Left rotator cuff tear arthropathy; and Lumbar degenerative disc disease on their pertinent problem list. Pain Assessment: Severity of   is reported as a  /10. Location:    / . Onset:  . Quality:  . Timing:  . Modifying factor(s):  Marland Kitchen Vitals:  vitals were not taken for this visit.   Reason for encounter: medication management.  Patient presents today for medication management.  She is endorsing increased bilateral buttock and gluteal pain overlying her SI joint and piriformis.  We discussed stretching exercises for her piriformis.  Future  considerations could include diagnostic SI joint and piriformis injection.  Otherwise continue medications as prescribed, no refills needed, refilled as below.    Pharmacotherapy Assessment  Analgesic: Tramadol 100 mg every 12 hours as needed  Monitoring:  PMP: PDMP not reviewed this encounter.       Pharmacotherapy: No side-effects or adverse reactions reported. Compliance: No problems identified. Effectiveness: Clinically acceptable.  No notes on file  No results found for: "CBDTHCR" No results found for: "D8THCCBX" No results found for: "D9THCCBX"  UDS:  Summary  Date Value Ref Range Status  02/07/2021 Note  Final    Comment:    ==================================================================== [employer] Select 13 (MW) ==================================================================== Test                             Result       Flag       Units  Drug Present and Declared for Prescription Verification   Tramadol                       >2907        EXPECTED   ng/mg creat   O-Desmethyltramadol            >2907        EXPECTED   ng/mg creat   N-Desmethyltramadol            >2907        EXPECTED   ng/mg creat    Source of tramadol is a prescription medication. O-desmethyltramadol    and N-desmethyltramadol are expected metabolites of tramadol.  ==================================================================== Test  CALCIUM 9.3 01/04/2023    Bone No results found for: "VD25OH", "VD125OH2TOT", "VO5366YQ0", "HK7425ZD6", "25OHVITD1", "25OHVITD2", "25OHVITD3", "TESTOFREE", "TESTOSTERONE"  Inflammation (CRP: Acute Phase) (ESR: Chronic Phase) Lab Results  Component Value Date   ESRSEDRATE 8 01/04/2023         Note: Above Lab results reviewed.  Recent Imaging Review  VAS US CAROTID Carotid Arterial Duplex Study  Patient Name:  Melissa Montes [facility]  Date of Exam:   02/12/2023 Medical Rec #: [id number]             Accession #:    [id number] Date of Birth: Oct 14, 1956            Patient Gender: F Patient Age:   67 years Exam Location:  [facility] Procedure:      VAS US CAROTID Referring Phys: Sheppard Plumber  --------------------------------------------------------------------------------   Indications:  Carotid artery disease. Risk Factors: Hypertension, hyperlipidemia, past history of smoking, coronary               artery disease.  Performing Technologist: Hardie Lora RVT    Examination Guidelines: A complete evaluation includes B-mode imaging, spectral Doppler, color Doppler, and power Doppler as needed of all accessible portions of each vessel. Bilateral testing is considered an integral part of a complete examination. Limited examinations for reoccurring indications may be performed as noted.    Right Carotid Findings: +----------+--------+--------+--------+------------------+--------+           PSV cm/sEDV cm/sStenosisPlaque DescriptionComments +----------+--------+--------+--------+------------------+--------+ CCA Prox  85      21                                         +----------+--------+--------+--------+------------------+--------+ CCA Mid   85      20                                         +----------+--------+--------+--------+------------------+--------+ CCA Distal58      13                                         +----------+--------+--------+--------+------------------+--------+ ICA Prox  62      17      1-39%   diffuse                    +----------+--------+--------+--------+------------------+--------+ ICA Mid   78      29                                         +----------+--------+--------+--------+------------------+--------+ ICA Distal84      30                                         +----------+--------+--------+--------+------------------+--------+ ECA       101     16                                          +----------+--------+--------+--------+------------------+--------+  +----------+--------+-------+----------------+-------------------+  CALCIUM 9.3 01/04/2023    Bone No results found for: "VD25OH", "VD125OH2TOT", "VO5366YQ0", "HK7425ZD6", "25OHVITD1", "25OHVITD2", "25OHVITD3", "TESTOFREE", "TESTOSTERONE"  Inflammation (CRP: Acute Phase) (ESR: Chronic Phase) Lab Results  Component Value Date   ESRSEDRATE 8 01/04/2023         Note: Above Lab results reviewed.  Recent Imaging Review  VAS US CAROTID Carotid Arterial Duplex Study  Patient Name:  Melissa Montes [facility]  Date of Exam:   02/12/2023 Medical Rec #: [id number]             Accession #:    [id number] Date of Birth: Oct 14, 1956            Patient Gender: F Patient Age:   67 years Exam Location:  [facility] Procedure:      VAS US CAROTID Referring Phys: Sheppard Plumber  --------------------------------------------------------------------------------   Indications:  Carotid artery disease. Risk Factors: Hypertension, hyperlipidemia, past history of smoking, coronary               artery disease.  Performing Technologist: Hardie Lora RVT    Examination Guidelines: A complete evaluation includes B-mode imaging, spectral Doppler, color Doppler, and power Doppler as needed of all accessible portions of each vessel. Bilateral testing is considered an integral part of a complete examination. Limited examinations for reoccurring indications may be performed as noted.    Right Carotid Findings: +----------+--------+--------+--------+------------------+--------+           PSV cm/sEDV cm/sStenosisPlaque DescriptionComments +----------+--------+--------+--------+------------------+--------+ CCA Prox  85      21                                         +----------+--------+--------+--------+------------------+--------+ CCA Mid   85      20                                         +----------+--------+--------+--------+------------------+--------+ CCA Distal58      13                                         +----------+--------+--------+--------+------------------+--------+ ICA Prox  62      17      1-39%   diffuse                    +----------+--------+--------+--------+------------------+--------+ ICA Mid   78      29                                         +----------+--------+--------+--------+------------------+--------+ ICA Distal84      30                                         +----------+--------+--------+--------+------------------+--------+ ECA       101     16                                          +----------+--------+--------+--------+------------------+--------+  +----------+--------+-------+----------------+-------------------+  PROVIDER NOTE: Information contained herein reflects review and annotations entered in association with encounter. Interpretation of such information and data should be left to medically-trained personnel. Information provided to patient can be located elsewhere in the medical record under "Patient Instructions". Document created using STT-dictation technology, any transcriptional errors that may result from process are unintentional.    Patient: Melissa Montes  Service Category: E/M  Provider: Edward Jolly, MD  DOB: 02-10-1956  DOS: 08/13/2023  Referring Provider: Margaretann Loveless, MD  MRN: [id number]  Specialty: Interventional Pain Management  PCP: Margaretann Loveless, MD  Type: Established Patient  Setting: Ambulatory outpatient    Location: Office  Delivery: Face-to-face     HPI  Melissa Montes, a 67 y.o. 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 Melissa Montes's primary complain today is buttock pain Last encounter: My last encounter with her was on 03/26/23 Pertinent problems: Melissa Montes has S/P coronary artery stent placement; Peripheral vascular disease (HCC); GAD (generalized anxiety disorder); S/P coronary artery bypass graft x 2; Osteoporosis, post-menopausal; Chronic pain syndrome; Lumbar facet arthropathy; Bilateral hip pain; Arthritis of both hips; Chronic left shoulder pain; Right rotator cuff tear arthropathy; Left rotator cuff tear arthropathy; and Lumbar degenerative disc disease on their pertinent problem list. Pain Assessment: Severity of   is reported as a  /10. Location:    / . Onset:  . Quality:  . Timing:  . Modifying factor(s):  Marland Kitchen Vitals:  vitals were not taken for this visit.   Reason for encounter: medication management.  Patient presents today for medication management.  She is endorsing increased bilateral buttock and gluteal pain overlying her SI joint and piriformis.  We discussed stretching exercises for her piriformis.  Future  considerations could include diagnostic SI joint and piriformis injection.  Otherwise continue medications as prescribed, no refills needed, refilled as below.    Pharmacotherapy Assessment  Analgesic: Tramadol 100 mg every 12 hours as needed  Monitoring:  PMP: PDMP not reviewed this encounter.       Pharmacotherapy: No side-effects or adverse reactions reported. Compliance: No problems identified. Effectiveness: Clinically acceptable.  No notes on file  No results found for: "CBDTHCR" No results found for: "D8THCCBX" No results found for: "D9THCCBX"  UDS:  Summary  Date Value Ref Range Status  02/07/2021 Note  Final    Comment:    ==================================================================== [employer] Select 13 (MW) ==================================================================== Test                             Result       Flag       Units  Drug Present and Declared for Prescription Verification   Tramadol                       >2907        EXPECTED   ng/mg creat   O-Desmethyltramadol            >2907        EXPECTED   ng/mg creat   N-Desmethyltramadol            >2907        EXPECTED   ng/mg creat    Source of tramadol is a prescription medication. O-desmethyltramadol    and N-desmethyltramadol are expected metabolites of tramadol.  ==================================================================== Test  CALCIUM 9.3 01/04/2023    Bone No results found for: "VD25OH", "VD125OH2TOT", "VO5366YQ0", "HK7425ZD6", "25OHVITD1", "25OHVITD2", "25OHVITD3", "TESTOFREE", "TESTOSTERONE"  Inflammation (CRP: Acute Phase) (ESR: Chronic Phase) Lab Results  Component Value Date   ESRSEDRATE 8 01/04/2023         Note: Above Lab results reviewed.  Recent Imaging Review  VAS US CAROTID Carotid Arterial Duplex Study  Patient Name:  Melissa Montes [facility]  Date of Exam:   02/12/2023 Medical Rec #: [id number]             Accession #:    [id number] Date of Birth: Oct 14, 1956            Patient Gender: F Patient Age:   67 years Exam Location:  [facility] Procedure:      VAS US CAROTID Referring Phys: Sheppard Plumber  --------------------------------------------------------------------------------   Indications:  Carotid artery disease. Risk Factors: Hypertension, hyperlipidemia, past history of smoking, coronary               artery disease.  Performing Technologist: Hardie Lora RVT    Examination Guidelines: A complete evaluation includes B-mode imaging, spectral Doppler, color Doppler, and power Doppler as needed of all accessible portions of each vessel. Bilateral testing is considered an integral part of a complete examination. Limited examinations for reoccurring indications may be performed as noted.    Right Carotid Findings: +----------+--------+--------+--------+------------------+--------+           PSV cm/sEDV cm/sStenosisPlaque DescriptionComments +----------+--------+--------+--------+------------------+--------+ CCA Prox  85      21                                         +----------+--------+--------+--------+------------------+--------+ CCA Mid   85      20                                         +----------+--------+--------+--------+------------------+--------+ CCA Distal58      13                                         +----------+--------+--------+--------+------------------+--------+ ICA Prox  62      17      1-39%   diffuse                    +----------+--------+--------+--------+------------------+--------+ ICA Mid   78      29                                         +----------+--------+--------+--------+------------------+--------+ ICA Distal84      30                                         +----------+--------+--------+--------+------------------+--------+ ECA       101     16                                          +----------+--------+--------+--------+------------------+--------+  +----------+--------+-------+----------------+-------------------+  CALCIUM 9.3 01/04/2023    Bone No results found for: "VD25OH", "VD125OH2TOT", "VO5366YQ0", "HK7425ZD6", "25OHVITD1", "25OHVITD2", "25OHVITD3", "TESTOFREE", "TESTOSTERONE"  Inflammation (CRP: Acute Phase) (ESR: Chronic Phase) Lab Results  Component Value Date   ESRSEDRATE 8 01/04/2023         Note: Above Lab results reviewed.  Recent Imaging Review  VAS US CAROTID Carotid Arterial Duplex Study  Patient Name:  Melissa Montes [facility]  Date of Exam:   02/12/2023 Medical Rec #: [id number]             Accession #:    [id number] Date of Birth: Oct 14, 1956            Patient Gender: F Patient Age:   67 years Exam Location:  [facility] Procedure:      VAS US CAROTID Referring Phys: Sheppard Plumber  --------------------------------------------------------------------------------   Indications:  Carotid artery disease. Risk Factors: Hypertension, hyperlipidemia, past history of smoking, coronary               artery disease.  Performing Technologist: Hardie Lora RVT    Examination Guidelines: A complete evaluation includes B-mode imaging, spectral Doppler, color Doppler, and power Doppler as needed of all accessible portions of each vessel. Bilateral testing is considered an integral part of a complete examination. Limited examinations for reoccurring indications may be performed as noted.    Right Carotid Findings: +----------+--------+--------+--------+------------------+--------+           PSV cm/sEDV cm/sStenosisPlaque DescriptionComments +----------+--------+--------+--------+------------------+--------+ CCA Prox  85      21                                         +----------+--------+--------+--------+------------------+--------+ CCA Mid   85      20                                         +----------+--------+--------+--------+------------------+--------+ CCA Distal58      13                                         +----------+--------+--------+--------+------------------+--------+ ICA Prox  62      17      1-39%   diffuse                    +----------+--------+--------+--------+------------------+--------+ ICA Mid   78      29                                         +----------+--------+--------+--------+------------------+--------+ ICA Distal84      30                                         +----------+--------+--------+--------+------------------+--------+ ECA       101     16                                          +----------+--------+--------+--------+------------------+--------+  +----------+--------+-------+----------------+-------------------+

## 2023-08-21 ENCOUNTER — Other Ambulatory Visit: Payer: Self-pay | Admitting: Cardiovascular Disease

## 2023-08-26 ENCOUNTER — Ambulatory Visit: Payer: Medicare HMO

## 2023-08-27 ENCOUNTER — Ambulatory Visit (INDEPENDENT_AMBULATORY_CARE_PROVIDER_SITE_OTHER): Payer: Medicare HMO | Admitting: Cardiovascular Disease

## 2023-08-27 ENCOUNTER — Ambulatory Visit
Admission: RE | Admit: 2023-08-27 | Discharge: 2023-08-27 | Disposition: A | Discharge status: Home / Self Care | Payer: Medicare HMO | Source: Ambulatory Visit | Attending: Acute Care | Admitting: Acute Care

## 2023-08-27 ENCOUNTER — Encounter: Payer: Self-pay | Admitting: Cardiovascular Disease

## 2023-08-27 VITALS — BP 118/73 | HR 75 | Ht 62.0 in | Wt 119.4 lb

## 2023-08-27 DIAGNOSIS — Z122 Encounter for screening for malignant neoplasm of respiratory organs: Secondary | ICD-10-CM | POA: Diagnosis not present

## 2023-08-27 DIAGNOSIS — F17210 Nicotine dependence, cigarettes, uncomplicated: Secondary | ICD-10-CM | POA: Diagnosis not present

## 2023-08-27 DIAGNOSIS — Z955 Presence of coronary angioplasty implant and graft: Secondary | ICD-10-CM

## 2023-08-27 DIAGNOSIS — I471 Supraventricular tachycardia, unspecified: Secondary | ICD-10-CM

## 2023-08-27 DIAGNOSIS — I7143 Infrarenal abdominal aortic aneurysm, without rupture: Secondary | ICD-10-CM

## 2023-08-27 DIAGNOSIS — I2510 Atherosclerotic heart disease of native coronary artery without angina pectoris: Secondary | ICD-10-CM | POA: Diagnosis not present

## 2023-08-27 DIAGNOSIS — Z87891 Personal history of nicotine dependence: Secondary | ICD-10-CM | POA: Insufficient documentation

## 2023-08-27 DIAGNOSIS — I7409 Other arterial embolism and thrombosis of abdominal aorta: Secondary | ICD-10-CM | POA: Diagnosis not present

## 2023-08-27 DIAGNOSIS — I739 Peripheral vascular disease, unspecified: Secondary | ICD-10-CM

## 2023-08-27 NOTE — Progress Notes (Signed)
Cardiology Office Note   Date:  08/27/2023   ID:  Melissa Montes, DOB October 12, 1956, MRN [id number]  PCP:  Margaretann Loveless, MD  Cardiologist:  Adrian Blackwater, MD      History of Present Illness: Melissa Montes is a 67 y.o. female who presents for  Chief Complaint  Patient presents with  . Follow-up    Shortness of Breath This is a recurrent problem. The current episode started more than 1 year ago. The problem has been waxing and waning.      Past Medical History:  Diagnosis Date  . ADD (attention deficit disorder)   . Anxiety   . Aortic aneurysm (HCC)   . Atherosclerosis of abdominal aorta (HCC)   . Bipolar disorder (HCC)   . Bulging of lumbar intervertebral disc   . CAD (coronary artery disease)   . Cancer (HCC)   . COPD (chronic obstructive pulmonary disease) (HCC)   . Depression   . Diverticulitis   . Dyspnea   . History of kidney stones   . Hyperlipidemia   . Hypertension   . Insomnia   . Lumbar spondylosis   . MI (myocardial infarction) (HCC)    2007, 2019  . Migraine   . Osteoporosis   . PONV (postoperative nausea and vomiting)   . PVD (peripheral vascular disease) (HCC)   . 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 SVT (supraventricular tachycardia) (HCC)      Past Surgical History:  Procedure Laterality Date  . ABDOMINAL HYSTERECTOMY    . CAROTID ANGIOGRAPHY N/A 01/13/2018   Procedure: CAROTID ANGIOGRAPHY;  Surgeon: Annice Needy, MD;  Location: [facility] INVASIVE CV LAB;  Service: Cardiovascular;  Laterality: N/A;  . COLONOSCOPY    . CORONARY ANGIOPLASTY WITH STENT PLACEMENT    . ENDOVASCULAR REPAIR/STENT GRAFT Bilateral 12/14/2020   Procedure: ENDOVASCULAR REPAIR/STENT GRAFT;  Surgeon: Annice Needy, MD;  Location: [facility] INVASIVE CV LAB;  Service: Cardiovascular;  Laterality: Bilateral;  . iv INJECTIONS TO BACK    . LEFT HEART CATH Right 07/07/2018   Procedure: Left Heart Cath  and Coronary Angiography;  Surgeon: Laurier Nancy, MD;  Location: [facility] INVASIVE CV LAB;  Service: Cardiovascular;  Laterality: Right;     Current Outpatient Medications  Medication Sig Dispense Refill  . aspirin EC 81 MG tablet Take 162 mg by mouth every evening.     Marland Kitchen atorvastatin (LIPITOR) 80 MG tablet Take 1 tablet (80 mg total) by mouth at bedtime. 90 tablet 0  . Cholecalciferol (VITAMIN D3) 1.25 MG (50000 UT) capsule TAKE 1 CAPSULE BY MOUTH 1 TIME WEEKLY 12 capsule 3  . clopidogrel (PLAVIX) 75 MG tablet TAKE 1 TABLET BY MOUTH EVERY DAY 90 tablet 1  . DULoxetine (CYMBALTA) 30 MG capsule TAKE 1 CAPSULE(30 MG) BY MOUTH DAILY 90 capsule 1  . ezetimibe (ZETIA) 10 MG tablet TAKE 1 TABLET BY MOUTH EVERY DAY 90 tablet 1  . fluticasone (FLONASE) 50 MCG/ACT nasal spray Place 1 spray into both nostrils daily. 9.9 mL 2  . gabapentin (NEURONTIN) 300 MG capsule Take 1 capsule (300 mg total) by mouth daily. 90 capsule 1  . hydrOXYzine (VISTARIL) 25 MG capsule TAKE 1 CAPSULE BY MOUTH DAILY AS NEEDED FOR ANXIETY AND 2 CAPSULES AT BEDTIME AS NEEDED FOR SLEEP AS DIRECTED 90 capsule 1  . metoprolol succinate (TOPROL-XL) 50 MG 24 hr tablet Take 1 tablet (50 mg total) by mouth  daily. 90 tablet 1  . Multiple Vitamin (MULTIVITAMIN) tablet Take 1 tablet by mouth daily. Centrum Silver for Women    . nitroGLYCERIN (NITROSTAT) 0.4 MG SL tablet Place 0.4 mg under the tongue every 5 (five) minutes x 3 doses as needed for chest pain.     . Probiotic Product (PROBIOTIC DAILY PO) Take 1 capsule by mouth daily.    . promethazine (PHENERGAN) 25 MG tablet Take 25 mg by mouth every 8 (eight) hours as needed for nausea or vomiting.    . traMADol HCl 100 MG TABS Take 1 tablet by mouth every 12 (twelve) hours as needed. 60 tablet 5  . traZODone (DESYREL) 100 MG tablet Take 1 tablet (100 mg total) by mouth at bedtime. 90 tablet 1   No current facility-administered medications for this visit.    Allergies:   Effexor xr  [venlafaxine hcl], Penicillins, Amoxicillin-pot clavulanate, and Keflex [cephalexin]    Social History:   reports that she quit smoking about 4 years ago. Her smoking use included cigarettes. She started smoking about 45 years ago. She has a 41 pack-year smoking history. She has never used smokeless tobacco. She reports that she does not drink alcohol and does not use drugs.   Family History:  family history includes Breast cancer in her maternal aunt; Breast cancer (age of onset: 57) in her mother; Depression in her sister.    ROS:     Review of Systems  Constitutional: Negative.   HENT: Negative.    Eyes: Negative.   Respiratory:  Positive for shortness of breath.   Gastrointestinal: Negative.   Genitourinary: Negative.   Musculoskeletal: Negative.   Skin: Negative.   Neurological: Negative.   Endo/Heme/Allergies: Negative.   Psychiatric/Behavioral: Negative.    All other systems reviewed and are negative.     All other systems are reviewed and negative.    PHYSICAL EXAM: VS:  BP 118/73   Pulse 75   Ht 5\' 2"  (1.575 m)   Wt 119 lb 6.4 oz (54.2 kg)   SpO2 97%   BMI 21.84 kg/m  , BMI Body mass index is 21.84 kg/m. Last weight:  Wt Readings from Last 3 Encounters:  08/27/23 119 lb 6.4 oz (54.2 kg)  03/26/23 122 lb (55.3 kg)  02/25/23 124 lb 3.2 oz (56.3 kg)     Physical Exam Constitutional:      Appearance: Normal appearance.  Cardiovascular:     Rate and Rhythm: Normal rate and regular rhythm.     Heart sounds: Normal heart sounds.  Pulmonary:     Effort: Pulmonary effort is normal.     Breath sounds: Normal breath sounds.  Musculoskeletal:     Right lower leg: No edema.     Left lower leg: No edema.  Neurological:     Mental Status: She is alert.      EKG:   Recent Labs: 01/04/2023: ALT 17; BUN 17; Creatinine, Ser 0.76; Hemoglobin 14.3; Potassium 4.6; Sodium 140    Lipid Panel    Component Value Date/Time   CHOL 146 01/04/2023 1426   TRIG 82  01/04/2023 1426   HDL 61 01/04/2023 1426   LDLCALC 69 01/04/2023 1426      Other studies Reviewed: Additional studies/ records that were reviewed today include:  Review of the above records demonstrates:       No data to display            ASSESSMENT AND PLAN:    ICD-10-CM   1.  SVT (supraventricular tachycardia) (HCC)  I47.10 MYOCARDIAL PERFUSION IMAGING    PCV ECHOCARDIOGRAM COMPLETE   no episodes    2. Thrombosis of abdominal aorta (HCC)  I74.09 MYOCARDIAL PERFUSION IMAGING    PCV ECHOCARDIOGRAM COMPLETE    3. Peripheral vascular disease (HCC)  I73.9 MYOCARDIAL PERFUSION IMAGING    PCV ECHOCARDIOGRAM COMPLETE    4. Coronary artery disease involving native coronary artery of native heart without angina pectoris  I25.10 MYOCARDIAL PERFUSION IMAGING    PCV ECHOCARDIOGRAM COMPLETE    5. Infrarenal abdominal aortic aneurysm (AAA) without rupture (HCC)  I71.43 MYOCARDIAL PERFUSION IMAGING    PCV ECHOCARDIOGRAM COMPLETE    6. S/P right coronary artery (RCA) stent placement  Z95.5 MYOCARDIAL PERFUSION IMAGING    PCV ECHOCARDIOGRAM COMPLETE   no chest pain       Problem List Items Addressed This Visit       Cardiovascular and Mediastinum   SVT (supraventricular tachycardia) (HCC) - Primary   Relevant Orders   MYOCARDIAL PERFUSION IMAGING   PCV ECHOCARDIOGRAM COMPLETE   Peripheral vascular disease (HCC)   Relevant Orders   MYOCARDIAL PERFUSION IMAGING   PCV ECHOCARDIOGRAM COMPLETE   CAD (coronary artery disease)   Relevant Orders   MYOCARDIAL PERFUSION IMAGING   PCV ECHOCARDIOGRAM COMPLETE   Thrombosis of abdominal aorta (HCC)   Relevant Orders   MYOCARDIAL PERFUSION IMAGING   PCV ECHOCARDIOGRAM COMPLETE   AAA (abdominal aortic aneurysm) without rupture (HCC)   Relevant Orders   MYOCARDIAL PERFUSION IMAGING   PCV ECHOCARDIOGRAM COMPLETE   Other Visit Diagnoses     S/P right coronary artery (RCA) stent placement       no chest pain   Relevant Orders    MYOCARDIAL PERFUSION IMAGING   PCV ECHOCARDIOGRAM COMPLETE          Disposition:   Return in about 5 weeks (around 10/01/2023) for echo, stress test and f/u.    Total time spent: 30 minutes  Signed,  Adrian Blackwater, MD  08/27/2023 3:32 PM    [facility]

## 2023-09-04 ENCOUNTER — Other Ambulatory Visit: Payer: Self-pay | Admitting: Cardiovascular Disease

## 2023-09-04 ENCOUNTER — Ambulatory Visit: Payer: Medicare HMO

## 2023-09-04 DIAGNOSIS — I361 Nonrheumatic tricuspid (valve) insufficiency: Secondary | ICD-10-CM

## 2023-09-04 DIAGNOSIS — I739 Peripheral vascular disease, unspecified: Secondary | ICD-10-CM

## 2023-09-04 DIAGNOSIS — Z955 Presence of coronary angioplasty implant and graft: Secondary | ICD-10-CM

## 2023-09-04 DIAGNOSIS — I351 Nonrheumatic aortic (valve) insufficiency: Secondary | ICD-10-CM

## 2023-09-04 DIAGNOSIS — I2510 Atherosclerotic heart disease of native coronary artery without angina pectoris: Secondary | ICD-10-CM

## 2023-09-04 DIAGNOSIS — I7409 Other arterial embolism and thrombosis of abdominal aorta: Secondary | ICD-10-CM

## 2023-09-04 DIAGNOSIS — I471 Supraventricular tachycardia, unspecified: Secondary | ICD-10-CM

## 2023-09-04 DIAGNOSIS — I7143 Infrarenal abdominal aortic aneurysm, without rupture: Secondary | ICD-10-CM

## 2023-09-12 ENCOUNTER — Other Ambulatory Visit: Payer: Self-pay | Admitting: Acute Care

## 2023-09-12 ENCOUNTER — Telehealth: Payer: Self-pay | Admitting: Acute Care

## 2023-09-12 DIAGNOSIS — R911 Solitary pulmonary nodule: Secondary | ICD-10-CM

## 2023-09-12 NOTE — Telephone Encounter (Signed)
I have called the patient with the results of her lung cancer screening scan. Her scan was read as a 4X. I Explained that there is a new lung nodule that was not present on her scan last year. It is 1.13 cm and needs additional imaging. She verbalized understanding of the above and  is in agreement with a PET scan. She understands she will get a call to get this scheduled in the next day or two. She will then have follow up in the [place] office with one of the bronch/ procedural pulmonologist's to review results and determine best plan of care moving forward.She understands she will get a call to make that appointment after the PET scan has a scheduled date. Please fax results to PCP and let them know plan of care.  Thanks so much

## 2023-09-12 NOTE — Telephone Encounter (Signed)
Tiffany calling with call report. For CT scan.Tiffany phone number is [phone]. 

## 2023-09-12 NOTE — Telephone Encounter (Signed)
Noted.  Will call patient to schedule OV once PET has been scheduled.

## 2023-09-12 NOTE — Telephone Encounter (Signed)
Call report received and routed to provider:  IMPRESSION: 1. Lung-RADS 4X, highly suspicious. Additional imaging evaluation or consultation with Pulmonology or Thoracic Surgery recommended. Development of a right lower lobe pleural-based dominant nodule, multiple areas of right-sided pleural thickening, and right infrahilar soft tissue fullness (likely adenopathy). 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 2. Incidental findings, including: Cholelithiasis. Aortic atherosclerosis (ICD10-I70.0) and emphysema (ICD10-J43.9). Left nephrolithiasis.

## 2023-09-12 NOTE — Telephone Encounter (Signed)
I have attempted to call the patient with the results of their  Low Dose CT Chest Lung cancer screening scan. There was no answer. I have left a HIPPA compliant VM requesting the patient call the office for the scan results. I included the office contact information in the message. We will await her return call. If no return call we will continue to call until patient is contacted.  Melissa Montes and [place],   She needs a PET scan and then follow up with [facility] MD's to review the results. One I speak with her I will place the order.  Let me know if she calls back. Once I have spoken with her we can let her PCP know results too.   Thanks so much

## 2023-09-13 NOTE — Telephone Encounter (Signed)
Spoke to patient and scheduled appt 09/24/2023 at 2:30. Directions provided.  Nothing further needed.

## 2023-09-15 DIAGNOSIS — Z0100 Encounter for examination of eyes and vision without abnormal findings: Secondary | ICD-10-CM | POA: Diagnosis not present

## 2023-09-18 ENCOUNTER — Ambulatory Visit
Admission: RE | Admit: 2023-09-18 | Discharge: 2023-09-18 | Disposition: A | Discharge status: Home / Self Care | Payer: Medicare HMO | Source: Ambulatory Visit | Attending: Acute Care | Admitting: Acute Care

## 2023-09-18 DIAGNOSIS — R911 Solitary pulmonary nodule: Secondary | ICD-10-CM | POA: Insufficient documentation

## 2023-09-18 DIAGNOSIS — C782 Secondary malignant neoplasm of pleura: Secondary | ICD-10-CM | POA: Diagnosis not present

## 2023-09-18 LAB — GLUCOSE, CAPILLARY: Glucose-Capillary: 92 mg/dL (ref 70–99)

## 2023-09-18 MED ORDER — FLUDEOXYGLUCOSE F - 18 (FDG) INJECTION
6.2000 | Freq: Once | INTRAVENOUS | Status: AC | PRN
  Administered 2023-09-18: 6.78 via INTRAVENOUS

## 2023-09-19 ENCOUNTER — Ambulatory Visit: Payer: Medicare HMO

## 2023-09-20 ENCOUNTER — Other Ambulatory Visit: Payer: Self-pay | Admitting: Psychiatry

## 2023-09-20 DIAGNOSIS — F411 Generalized anxiety disorder: Secondary | ICD-10-CM

## 2023-09-24 ENCOUNTER — Telehealth: Payer: Self-pay

## 2023-09-24 ENCOUNTER — Ambulatory Visit (INDEPENDENT_AMBULATORY_CARE_PROVIDER_SITE_OTHER): Payer: Medicare HMO | Admitting: Pulmonary Disease

## 2023-09-24 ENCOUNTER — Encounter: Payer: Self-pay | Admitting: Pulmonary Disease

## 2023-09-24 VITALS — BP 90/66 | HR 89 | Temp 97.6°F | Ht 62.0 in | Wt 118.6 lb

## 2023-09-24 DIAGNOSIS — R911 Solitary pulmonary nodule: Secondary | ICD-10-CM

## 2023-09-24 MED ORDER — BEVESPI AEROSPHERE 9-4.8 MCG/ACT IN AERO: 2.0000 | INHALATION_SPRAY | Freq: Two times a day (BID) | RESPIRATORY_TRACT | 3 refills | Status: DC

## 2023-09-24 NOTE — Progress Notes (Signed)
Synopsis: Referred in by Margaretann Loveless, MD   Subjective:   PATIENT ID: Melissa Montes GENDER: female DOB: [birth year], MRN: [id number]  Chief Complaint  Patient presents with   Consult    Shortness of breath on exertion and occasional at rest. No cough or wheezing.     HPI Melissa Montes is a 67 year old female patient with past medical history of heavy tobacco use, clinical COPD, abdominal aortic aneurysm status post endovascular repair/stent graft by Dr. Festus Barren in 2022, CAD status post CABG in 2019 presenting today to the pulmonary clinic for evaluation of lung nodules and mediastinal lymphadenopathy.  She is enrolled in the low-dose CT scan program and underwent a CT chest without contrast on 10/8 which showed right lower lobe pleural-based pulmonary nodule with multiple areas of right-sided pleural thickening and right infrahilar soft tissue fullness.  She subsequently underwent a PET scan which showed high FDG avidity involving the right pleural and the mediastinal lymphnodes concerning for primary lung malignancy with metastasis to the pleura.   Family history - No family history of pulmonary disease   Social history - Ex smoker quit in 2019 (41 PPY).   ROS All systems were reviewed and are negative except for the above.  Objective:   Vitals:   09/24/23 1420  BP: 90/66  Pulse: 89  Temp: 97.6 F (36.4 C)  TempSrc: Temporal  SpO2: 96%  Weight: 118 lb 9.6 oz (53.8 kg)  Height: 5\' 2"  (1.575 m)   96% on RA BMI Readings from Last 3 Encounters:  09/24/23 21.69 kg/m  08/27/23 21.84 kg/m  03/26/23 23.05 kg/m   Wt Readings from Last 3 Encounters:  09/24/23 118 lb 9.6 oz (53.8 kg)  08/27/23 119 lb 6.4 oz (54.2 kg)  03/26/23 122 lb (55.3 kg)    Physical Exam GEN: NAD, Healthy Appearing HEENT: Supple Neck, Reactive Pupils, EOMI  CVS: Normal S1, Normal S2, RRR, No murmurs or ES appreciated  Lungs: Clear bilateral air entry.  Abdomen: Soft, non tender, non  distended, + BS  Extremities: Warm and well perfused, No edema  Skin: No suspicious lesions appreciated  Psych: Normal Affect  Labs and imaging were reviewed and are negative except for the above.  Ancillary Information   CBC    Component Value Date/Time   WBC 10.6 (H) 09/27/2023 1418   RBC 4.66 09/27/2023 1418   HGB 14.1 09/27/2023 1418   HGB 14.3 01/04/2023 1426   HCT 41.1 09/27/2023 1418   HCT 40.9 01/04/2023 1426   PLT 278 09/27/2023 1418   MCV 88.2 09/27/2023 1418   MCV 88 01/04/2023 1426   MCH 30.3 09/27/2023 1418   MCHC 34.3 09/27/2023 1418   RDW 12.6 09/27/2023 1418   RDW 12.2 01/04/2023 1426   LYMPHSABS 2.3 01/04/2023 1426   MONOABS 0.5 04/28/2021 1339   EOSABS 0.4 01/04/2023 1426   BASOSABS 0.1 01/04/2023 1426        No data to display           Assessment & Plan:  Melissa Montes is a 67 year old female patient with past medical history of heavy tobacco use, clinical COPD, abdominal aortic aneurysm status post endovascular repair/stent graft by Dr. Festus Barren in 2022, CAD status post CABG in 2019 presenting today to the pulmonary clinic for evaluation of lung nodules and mediastinal lymphadenopathy.  #Hyperactive nodular opacities involving the right pleura with mediastinal lymphnode (7, 10R involvement highly suspicous for primary lung malignancy, mesothelioma is also a possibility as well  as lymphoma  []  Scheduled for EBUS [facility] 09/30/2023. Will send samples for cytology and flow cytometry.   Return in about 3 months (around 12/25/2023).  I spent 60 minutes caring for this patient today, including preparing to see the patient, obtaining a medical history , reviewing a separately obtained history, performing a medically appropriate examination and/or evaluation, counseling and educating the patient/family/caregiver, ordering medications, tests, or procedures, documenting clinical information in the electronic health record, and independently interpreting results  (not separately reported/billed) and communicating results to the patient/family/caregiver  Janann Colonel, MD [place]  Pulmonary Critical Care 09/27/2023 5:06 PM

## 2023-09-24 NOTE — Telephone Encounter (Signed)
Patient has been scheduled for a EBUS on 09/30/2023 at 9:00am. Patient will need to arrive 1 hour before the procedure.  Pre-Admit appt is 09/26/2023 1-5p.  Lm x1 for the patient.

## 2023-09-24 NOTE — H&P (View-Only) (Signed)
Synopsis: Referred in by Margaretann Loveless, MD   Subjective:   PATIENT ID: Melissa Montes GENDER: female DOB: [birth year], MRN: [id number]  Chief Complaint  Patient presents with   Consult    Shortness of breath on exertion and occasional at rest. No cough or wheezing.     HPI [name] is a 67 year old female patient with past medical history of heavy tobacco use, clinical COPD, abdominal aortic aneurysm status post endovascular repair/stent graft by Dr. Festus Barren in 2022, CAD status post CABG in 2019 presenting today to the pulmonary clinic for evaluation of lung nodules and mediastinal lymphadenopathy.  She is enrolled in the low-dose CT scan program and underwent a CT chest without contrast on 10/8 which showed right lower lobe pleural-based pulmonary nodule with multiple areas of right-sided pleural thickening and right infrahilar soft tissue fullness.  She subsequently underwent a PET scan which showed high FDG avidity involving the right pleural and the mediastinal lymphnodes concerning for primary lung malignancy with metastasis to the pleura.   Family history - No family history of pulmonary disease   Social history - Ex smoker quit in 2019 (41 PPY).   ROS All systems were reviewed and are negative except for the above.  Objective:   Vitals:   09/24/23 1420  BP: 90/66  Pulse: 89  Temp: 97.6 F (36.4 C)  TempSrc: Temporal  SpO2: 96%  Weight: 118 lb 9.6 oz (53.8 kg)  Height: 5\' 2"  (1.575 m)   96% on RA BMI Readings from Last 3 Encounters:  09/24/23 21.69 kg/m  08/27/23 21.84 kg/m  03/26/23 23.05 kg/m   Wt Readings from Last 3 Encounters:  09/24/23 118 lb 9.6 oz (53.8 kg)  08/27/23 119 lb 6.4 oz (54.2 kg)  03/26/23 122 lb (55.3 kg)    Physical Exam GEN: NAD, Healthy Appearing HEENT: Supple Neck, Reactive Pupils, EOMI  CVS: Normal S1, Normal S2, RRR, No murmurs or ES appreciated  Lungs: Clear bilateral air entry.  Abdomen: Soft, non tender, non  distended, + BS  Extremities: Warm and well perfused, No edema  Skin: No suspicious lesions appreciated  Psych: Normal Affect  Labs and imaging were reviewed and are negative except for the above.  Ancillary Information   CBC    Component Value Date/Time   WBC 10.6 (H) 09/27/2023 1418   RBC 4.66 09/27/2023 1418   HGB 14.1 09/27/2023 1418   HGB 14.3 01/04/2023 1426   HCT 41.1 09/27/2023 1418   HCT 40.9 01/04/2023 1426   PLT 278 09/27/2023 1418   MCV 88.2 09/27/2023 1418   MCV 88 01/04/2023 1426   MCH 30.3 09/27/2023 1418   MCHC 34.3 09/27/2023 1418   RDW 12.6 09/27/2023 1418   RDW 12.2 01/04/2023 1426   LYMPHSABS 2.3 01/04/2023 1426   MONOABS 0.5 04/28/2021 1339   EOSABS 0.4 01/04/2023 1426   BASOSABS 0.1 01/04/2023 1426        No data to display           Assessment & Plan:  [name] is a 67 year old female patient with past medical history of heavy tobacco use, clinical COPD, abdominal aortic aneurysm status post endovascular repair/stent graft by Dr. Festus Barren in 2022, CAD status post CABG in 2019 presenting today to the pulmonary clinic for evaluation of lung nodules and mediastinal lymphadenopathy.  #Hyperactive nodular opacities involving the right pleura with mediastinal lymphnode (7, 10R involvement highly suspicous for primary lung malignancy, mesothelioma is also a possibility as well  as lymphoma  []  Scheduled for EBUS [facility] 09/30/2023. Will send samples for cytology and flow cytometry.   Return in about 3 months (around 12/25/2023).  I spent 60 minutes caring for this patient today, including preparing to see the patient, obtaining a medical history , reviewing a separately obtained history, performing a medically appropriate examination and/or evaluation, counseling and educating the patient/family/caregiver, ordering medications, tests, or procedures, documenting clinical information in the electronic health record, and independently interpreting results  (not separately reported/billed) and communicating results to the patient/family/caregiver  Janann Colonel, MD [place]  Pulmonary Critical Care 09/27/2023 5:06 PM

## 2023-09-25 ENCOUNTER — Telehealth (INDEPENDENT_AMBULATORY_CARE_PROVIDER_SITE_OTHER): Payer: Medicare HMO | Admitting: Psychiatry

## 2023-09-25 ENCOUNTER — Telehealth: Payer: Self-pay

## 2023-09-25 ENCOUNTER — Encounter: Payer: Self-pay | Admitting: Psychiatry

## 2023-09-25 DIAGNOSIS — G4701 Insomnia due to medical condition: Secondary | ICD-10-CM | POA: Diagnosis not present

## 2023-09-25 DIAGNOSIS — R52 Pain, unspecified: Secondary | ICD-10-CM | POA: Diagnosis not present

## 2023-09-25 DIAGNOSIS — F3176 Bipolar disorder, in full remission, most recent episode depressed: Secondary | ICD-10-CM | POA: Diagnosis not present

## 2023-09-25 DIAGNOSIS — F4321 Adjustment disorder with depressed mood: Secondary | ICD-10-CM | POA: Diagnosis not present

## 2023-09-25 DIAGNOSIS — F411 Generalized anxiety disorder: Secondary | ICD-10-CM

## 2023-09-25 MED ORDER — DULOXETINE HCL 30 MG PO CPEP: 30.0000 mg | ORAL_CAPSULE | Freq: Two times a day (BID) | ORAL | 1 refills | Status: DC

## 2023-09-25 NOTE — Telephone Encounter (Signed)
pt states that she has lung cancer and she has a lung biopsy on monday. she states she is having alot of stress and anxiety and that the hydroxyzine is not working

## 2023-09-25 NOTE — Telephone Encounter (Signed)
I have notified the patient. Nothing further needed. 

## 2023-09-25 NOTE — Progress Notes (Signed)
Virtual Visit via Video Note  I connected with Melissa Montes on 09/25/23 at 11:00 AM EST by a video enabled telemedicine application and verified that I am speaking with the correct person using two identifiers.  Location Provider Location : [facility] Patient Location : Home  Participants: Patient , Provider   I discussed the limitations of evaluation and management by telemedicine and the availability of in person appointments. The patient expressed understanding and agreed to proceed.   I discussed the assessment and treatment plan with the patient. The patient was provided an opportunity to ask questions and all were answered. The patient agreed with the plan and demonstrated an understanding of the instructions.   The patient was advised to call back or seek an in-person evaluation if the symptoms worsen or if the condition fails to improve as anticipated.   BH MD OP Progress Note  09/25/2023 3:03 PM Melissa Montes  MRN:  [id number]  Chief Complaint:  Chief Complaint  Patient presents with   Follow-up   Anxiety   Depression   Medication Refill   HPI: Melissa Montes is a 67 year old Caucasian female on disability, divorced, lives in [place], has a history of bipolar disorder, GAD, diverticulitis, abdominal aortic aneurysm, chronic pain was evaluated by telemedicine today.  Patient today reports she is currently undergoing testing to rule out lung cancer.  She had CT scan of her chest as well as PET scan recently.  She is currently awaiting a biopsy.  Patient reports it has been extremely anxiety provoking for her.  She reports she does have family support however she does not know if they will be able to support her much with their job and other activities.  She does have a friend who may be able to help her.  Patient currently struggles with sadness, anhedonia, low motivation, anxiety, worrying about things, trouble relaxing and so on.  Patient also reports physical  symptoms of feeling tired/fatigued and having shortness of breath on a regular basis.  Patient denies any suicidality, homicidality or perceptual disturbances.  Patient denies any side effects to medications, currently compliant on it.  Patient agreeable to dosage increase of Cymbalta.  She does have hydroxyzine available however she has not been taking it much.  Agreeable to take it as needed for severe anxiety symptoms.  Patient currently does not have a therapist however reports she will let writer know if she is interested in establishing care with a therapist.  Currently she does not have anyone to drive her to the appointments and hence would like to hold back on therapy for now.  Patient denies any other concerns today.  Visit Diagnosis:    ICD-10-CM   1. GAD (generalized anxiety disorder)  F41.1 DULoxetine (CYMBALTA) 30 MG capsule    2. Bipolar disorder, in full remission, most recent episode depressed (HCC)  F31.76     3. Insomnia due to medical condition  G47.01    Pain    4. Adjustment disorder with depressed mood  F43.21       Past Psychiatric History: I have reviewed past psychiatric history from progress note on 04/26/2020.  Past Medical History:  Past Medical History:  Diagnosis Date   ADD (attention deficit disorder)    Anxiety    Aortic aneurysm (HCC)    Atherosclerosis of abdominal aorta (HCC)    Bipolar disorder (HCC)    Bulging of lumbar intervertebral disc    CAD (coronary artery disease)    Cancer (HCC)  COPD (chronic obstructive pulmonary disease) (HCC)    Depression    Diverticulitis    Dyspnea    History of kidney stones    Hyperlipidemia    Hypertension    Insomnia    Lumbar spondylosis    MI (myocardial infarction) (HCC)    2007, 2019   Migraine    Osteoporosis    PONV (postoperative nausea and vomiting)    PVD (peripheral vascular disease) (HCC)    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HCC)    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   SVT (supraventricular tachycardia) (HCC)     Past Surgical History:  Procedure Laterality Date   ABDOMINAL HYSTERECTOMY     CAROTID ANGIOGRAPHY N/A 01/13/2018   Procedure: CAROTID ANGIOGRAPHY;  Surgeon: Annice Needy, MD;  Location: [facility];  Service: Cardiovascular;  Laterality: N/A;   COLONOSCOPY     CORONARY ANGIOPLASTY WITH STENT PLACEMENT     ENDOVASCULAR REPAIR/STENT GRAFT Bilateral 12/14/2020   Procedure: ENDOVASCULAR REPAIR/STENT GRAFT;  Surgeon: Annice Needy, MD;  Location: [facility];  Service: Cardiovascular;  Laterality: Bilateral;   iv INJECTIONS TO BACK     LEFT HEART CATH Right 07/07/2018   Procedure: Left Heart Cath and Coronary Angiography;  Surgeon: Laurier Nancy, MD;  Location: [facility] CV [facility];  Service: Cardiovascular;  Laterality: Right;    Family Psychiatric History: I have reviewed family psychiatric history from progress note on 04/26/2020.  Family History:  Family History  Problem Relation Age of Onset   Breast cancer Mother 26   Breast cancer Maternal Aunt        mat aunt and great aunt   Depression Sister     Social History: I have reviewed social history from progress note on 04/26/2020. Social History   Socioeconomic History   Marital status: Divorced    Spouse name: Not on file   Number of children: Not on file   Years of education: Not on file   Highest education level: Not on file  Occupational History   Occupation: disabled  Tobacco Use   Smoking status: Former    Current packs/day: 0.00    Average packs/day: 1 pack/day for 43.8 years (43.8 ttl pk-yrs)    Types: Cigarettes    Start date: 38    Quit date: 09/19/2018    Years since quitting: 5.0   Smokeless tobacco: Never   Tobacco comments:    uses nicotine gums  Substance and Sexual Activity   Alcohol use: No   Drug use: No   Sexual activity: Not on file  Other Topics Concern   Not on file  Social History Narrative   Not on file    Social Determinants of Health   Financial Resource Strain: Not on file  Food Insecurity: Not on file  Transportation Needs: Not on file  Physical Activity: Not on file  Stress: Not on file  Social Connections: Not on file    Allergies:  Allergies  Allergen Reactions   Effexor Xr [Venlafaxine Hcl]     suicidality   Penicillins Swelling   Amoxicillin-Pot Clavulanate    Keflex [Cephalexin] Hives    Metabolic Disorder Labs: No results found for: "HGBA1C", "MPG" No results found for: "PROLACTIN" Lab Results  Component Value Date   CHOL 146 01/04/2023   TRIG 82 01/04/2023   HDL 61 01/04/2023   LDLCALC 69 01/04/2023   No results found for: "TSH"  Therapeutic Level Labs: No results  found for: "LITHIUM" No results found for: "VALPROATE" No results found for: "CBMZ"  Current Medications: Current Outpatient Medications  Medication Sig Dispense Refill   aspirin EC 81 MG tablet Take 162 mg by mouth every evening.      atorvastatin (LIPITOR) 80 MG tablet TAKE 1 TABLET(80 MG) BY MOUTH AT BEDTIME 90 tablet 0   Cholecalciferol (VITAMIN D3) 1.25 MG (50000 UT) capsule TAKE 1 CAPSULE BY MOUTH 1 TIME WEEKLY 12 capsule 3   clopidogrel (PLAVIX) 75 MG tablet TAKE 1 TABLET BY MOUTH EVERY DAY 90 tablet 1   DULoxetine (CYMBALTA) 30 MG capsule Take 1 capsule (30 mg total) by mouth 2 (two) times daily. 180 capsule 1   ezetimibe (ZETIA) 10 MG tablet TAKE 1 TABLET BY MOUTH EVERY DAY 90 tablet 1   Glycopyrrolate-Formoterol (BEVESPI AEROSPHERE) 9-4.8 MCG/ACT AERO Inhale 2 puffs into the lungs 2 (two) times daily. 3 each 3   hydrOXYzine (VISTARIL) 25 MG capsule TAKE 1 CAPSULE BY MOUTH EVERY DAY AS NEEDED FOR ANXIETY AND 2 CAPSULES AT BEDTIME AS NEEDED FOR SLEEP, AS DIRECTED 90 capsule 5   metoprolol succinate (TOPROL-XL) 50 MG 24 hr tablet Take 1 tablet (50 mg total) by mouth daily. 90 tablet 1   Multiple Vitamin (MULTIVITAMIN) tablet Take 1 tablet by mouth daily. Centrum Silver for Women      nitroGLYCERIN (NITROSTAT) 0.4 MG SL tablet Place 0.4 mg under the tongue every 5 (five) minutes x 3 doses as needed for chest pain.      Probiotic Product (PROBIOTIC DAILY PO) Take 1 capsule by mouth daily.     promethazine (PHENERGAN) 25 MG tablet Take 25 mg by mouth every 8 (eight) hours as needed for nausea or vomiting.     traMADol HCl 100 MG TABS Take 1 tablet by mouth every 12 (twelve) hours as needed. 60 tablet 5   traZODone (DESYREL) 100 MG tablet Take 1 tablet (100 mg total) by mouth at bedtime. 90 tablet 1   No current facility-administered medications for this visit.     Musculoskeletal: Strength & Muscle Tone:  UTA Gait & Station:  Seated Patient leans: N/A  Psychiatric Specialty Exam: Review of Systems  Psychiatric/Behavioral:  Positive for decreased concentration and dysphoric mood. The patient is nervous/anxious.     There were no vitals taken for this visit.There is no height or weight on file to calculate BMI.  General Appearance: Casual  Eye Contact:  Fair  Speech:  Normal Rate  Volume:  Normal  Mood:  Anxious and Depressed  Affect:  Congruent  Thought Process:  Goal Directed and Descriptions of Associations: Intact  Orientation:  Full (Time, Place, and Person)  Thought Content: Logical   Suicidal Thoughts:  No  Homicidal Thoughts:  No  Memory:  Immediate;   Fair Recent;   Fair Remote;   Fair  Judgement:  Fair  Insight:  Fair  Psychomotor Activity:  Normal  Concentration:  Concentration: Fair and Attention Span: Fair  Recall:  [employer] of Knowledge: Fair  Language: Fair  Akathisia:  No  Handed:  Right  AIMS (if indicated): not done  Assets:  Communication Skills Desire for Improvement Housing Social Support  ADL's:  Intact  Cognition: WNL  Sleep:  Fair   Screenings: AIMS    Flowsheet Row Video Visit from 02/14/2022 in [facility] Office Visit from 01/16/2022 in [facility]  AIMS Total Score 0 0  GAD-7    Flowsheet Row Video Visit from 09/25/2023 in [facility] Office Visit from 09/05/2022 in [facility] Video Visit from 04/17/2022 in [facility] Video Visit from 02/14/2022 in [facility] Video Visit from 04/26/2021 in [facility]  Total GAD-7 Score 21 3 1 1 1       PHQ2-9    Flowsheet Row Video Visit from 09/25/2023 in [facility] Video Visit from 08/01/2023 in [facility] Video Visit from 02/19/2023 in [facility] Office Visit from 09/05/2022 in [facility] Office Visit from 08/02/2022 in [facility] Total Score 6 0 0 2 0  PHQ-9 Total Score 15 -- -- 5 --      Flowsheet Row Video Visit from 09/25/2023 in [facility] Video Visit from 08/01/2023 in [facility] Video Visit from 02/19/2023 in [facility]  C-SSRS RISK CATEGORY No Risk No Risk No Risk        Assessment and Plan: Melissa Montes is a 67 year old Caucasian female, divorced, disabled, lives in [place], has a history of bipolar disorder, GAD, aortic aneurysm status post repair, diverticulitis was evaluated by telemedicine today.  Patient with recent lung mass currently undergoing investigation for diagnostic clarification.  Patient currently reports worsening mood symptoms, plan as noted below.  Plan GAD-unstable Increase Cymbalta to 30 mg p.o. twice daily Hydroxyzine 25 mg p.o. daily 1 capsule during the day and 2 capsules at night as  needed.  Patient to make use of this for severe anxiety symptoms. Gabapentin 300 mg p.o. nightly  Bipolar disorder in remission Gabapentin 300 mg p.o. nightly  Insomnia-stable Hydroxyzine 100 mg p.o. nightly as needed Hydroxyzine 25 mg p.o. nightly as needed  Adjustment disorder with depressed mood-unstable Discussed referral for CBT-patient declines Patient to make use of hydroxyzine as needed for severe anxiety symptoms as noted above Cymbalta dosage readjusted as noted above.  I have reviewed labs including CMP, CBC with differential-01/04/2023-normal except for alkaline phosphatase slightly elevated.  Will continue to reassess in future sessions.  Patient provided brief supportive counseling.  Follow-up in clinic in 3 to 4 weeks or sooner in person. Collaboration of Care: Collaboration of Care: Patient refused AEB patient declines a referral for CBT  Patient/Guardian was advised Release of Information must be obtained prior to any record release in order to collaborate their care with an outside provider. Patient/Guardian was advised if they have not already done so to contact the registration department to sign all necessary forms in order for [place] to release information regarding their care.   Consent: Patient/Guardian gives verbal consent for treatment and assignment of benefits for services provided during this visit. Patient/Guardian expressed understanding and agreed to proceed.   This note was generated in part or whole with voice recognition software. Voice recognition is usually quite accurate but there are transcription errors that can and very often do occur. I apologize for any typographical errors that were not detected and corrected.    Jomarie Longs, MD 09/25/2023, 3:04 PM

## 2023-09-25 NOTE — Telephone Encounter (Signed)
Attempted to contact patient to discuss her concerns-had to leave a voicemail. ?

## 2023-09-25 NOTE — Telephone Encounter (Signed)
Lm x2 for patient. Will call once more due to nature of call.   

## 2023-09-26 ENCOUNTER — Ambulatory Visit: Payer: Medicare HMO | Admitting: Cardiovascular Disease

## 2023-09-26 ENCOUNTER — Encounter
Admission: RE | Admit: 2023-09-26 | Discharge: 2023-09-26 | Disposition: A | Discharge status: Home / Self Care | Payer: Medicare HMO | Source: Ambulatory Visit | Attending: Pulmonary Disease | Admitting: Pulmonary Disease

## 2023-09-26 DIAGNOSIS — Z01812 Encounter for preprocedural laboratory examination: Secondary | ICD-10-CM

## 2023-09-26 DIAGNOSIS — I10 Essential (primary) hypertension: Secondary | ICD-10-CM

## 2023-09-26 DIAGNOSIS — J449 Chronic obstructive pulmonary disease, unspecified: Secondary | ICD-10-CM

## 2023-09-26 DIAGNOSIS — Z01810 Encounter for preprocedural cardiovascular examination: Secondary | ICD-10-CM

## 2023-09-26 DIAGNOSIS — I25119 Atherosclerotic heart disease of native coronary artery with unspecified angina pectoris: Secondary | ICD-10-CM

## 2023-09-26 HISTORY — DX: Monoclonal gammopathy: D47.2

## 2023-09-26 HISTORY — DX: Unstable angina: I20.0

## 2023-09-26 HISTORY — DX: Chronic pain syndrome: G89.4

## 2023-09-26 HISTORY — DX: Presence of coronary angioplasty implant and graft: Z95.5

## 2023-09-26 HISTORY — DX: Presence of dental prosthetic device (complete) (partial): Z97.2

## 2023-09-26 HISTORY — DX: Chronic hepatitis, unspecified: K73.9

## 2023-09-26 HISTORY — DX: Other intervertebral disc degeneration, lumbar region without mention of lumbar back pain or lower extremity pain: M51.369

## 2023-09-26 HISTORY — DX: Other nonspecific abnormal finding of lung field: R91.8

## 2023-09-26 HISTORY — DX: Localized enlarged lymph nodes: R59.0

## 2023-09-26 HISTORY — DX: Other complications of anesthesia, initial encounter: T88.59XA

## 2023-09-26 HISTORY — DX: Personal history of nicotine dependence: Z87.891

## 2023-09-26 HISTORY — DX: Occlusion and stenosis of bilateral carotid arteries: I65.23

## 2023-09-26 HISTORY — DX: Syncope and collapse: R55

## 2023-09-26 NOTE — Patient Instructions (Signed)
Your procedure is scheduled on:09-30-23 Monday Report to the Registration Desk on the 1st floor of the [facility].Then proceed to the 2nd floor Surgery Desk To find out your arrival time, please call [phone] between 1PM - 3PM on:09-27-23 Friday If your arrival time is 6:00 am, do not arrive before that time as the [facility] entrance doors do not open until 6:00 am.  REMEMBER: Instructions that are not followed completely may result in serious medical risk, up to and including death; or upon the discretion of your surgeon and anesthesiologist your surgery may need to be rescheduled.  Do not eat food after midnight the night before surgery.  No gum chewing or hard candies.  You may however, drink CLEAR liquids up to 2 hours before you are scheduled to arrive for your surgery. Do not drink anything within 2 hours of your scheduled arrival time.  Clear liquids include: - water  - apple juice without pulp - gatorade (not RED colors) - black coffee or tea (Do NOT add milk or creamers to the coffee or tea) Do NOT drink anything that is not on this list.  One week prior to surgery:Stop NOW (09-26-23) Stop Anti-inflammatories (NSAIDS) such as Advil, Aleve, Ibuprofen, Motrin, Naproxen, Naprosyn and Aspirin based products such as Excedrin, Goody's Powder, BC Powder. Stop ANY OVER THE COUNTER supplements until after surgery (Probiotic, Multivitamin)  You may however, continue to take Tylenol/Tramadol if needed for pain up until the day of surgery.  Stop Aspirin 81 mg and clopidogrel (PLAVIX) 5 days prior to surgery as instructed by Dr Sheron Nightingale dose was on 09-24-23 (Tuesday)  Continue taking all of your other prescription medications up until the day of surgery.  ON THE DAY OF SURGERY ONLY TAKE THESE MEDICATIONS WITH SIPS OF WATER: -DULoxetine (CYMBALTA)  -ezetimibe (ZETIA)  -metoprolol succinate (TOPROL-XL)  -You may take hydrOXYzine (VISTARIL) if needed for anxiety  Use your  BEVESPI Inhaler the morning of surgery  No Alcohol for 24 hours before or after surgery.  No Smoking including e-cigarettes for 24 hours before surgery.  No chewable tobacco products for at least 6 hours before surgery.  No nicotine patches on the day of surgery.  Do not use any "recreational" drugs for at least a week (preferably 2 weeks) before your surgery.  Please be advised that the combination of cocaine and anesthesia may have negative outcomes, up to and including death. If you test positive for cocaine, your surgery will be cancelled.  On the morning of surgery brush your teeth with toothpaste and water, you may rinse your mouth with mouthwash if you wish. Do not swallow any toothpaste or mouthwash.  Do not wear jewelry, make-up, hairpins, clips or nail polish.  For welded (permanent) jewelry: bracelets, anklets, waist bands, etc.  Please have this removed prior to surgery.  If it is not removed, there is a chance that hospital personnel will need to cut it off on the day of surgery.  Do not wear lotions, powders, or perfumes.   Do not shave body hair from the neck down 48 hours before surgery.  Contact lenses, hearing aids and dentures may not be worn into surgery.  Do not bring valuables to the hospital. [facility] is not responsible for any missing/lost belongings or valuables.   Notify your doctor if there is any change in your medical condition (cold, fever, infection).  Wear comfortable clothing (specific to your surgery type) to the hospital.  After surgery, you can help prevent lung  complications by doing breathing exercises.  Take deep breaths and cough every 1-2 hours. Your doctor may order a device called an Incentive Spirometer to help you take deep breaths. When coughing or sneezing, hold a pillow firmly against your incision with both hands. This is called "splinting." Doing this helps protect your incision. It also decreases belly discomfort.  If you are  being admitted to the hospital overnight, leave your suitcase in the car. After surgery it may be brought to your room.  In case of increased patient census, it may be necessary for you, the patient, to continue your postoperative care in the Same Day Surgery department.  If you are being discharged the day of surgery, you will not be allowed to drive home. You will need a responsible individual to drive you home and stay with you for 24 hours after surgery.   If you are taking public transportation, you will need to have a responsible individual with you.  Please call the Pre-admissions Testing Dept. at [phone] if you have any questions about these instructions.  Surgery Visitation Policy:  Patients having surgery or a procedure may have two visitors.  Children under the age of 62 must have an adult with them who is not the patient.

## 2023-09-27 ENCOUNTER — Encounter
Admission: RE | Admit: 2023-09-27 | Discharge: 2023-09-27 | Disposition: A | Discharge status: Home / Self Care | Payer: Medicare HMO | Source: Ambulatory Visit | Attending: Pulmonary Disease

## 2023-09-27 DIAGNOSIS — I25119 Atherosclerotic heart disease of native coronary artery with unspecified angina pectoris: Secondary | ICD-10-CM | POA: Insufficient documentation

## 2023-09-27 DIAGNOSIS — Z87891 Personal history of nicotine dependence: Secondary | ICD-10-CM | POA: Insufficient documentation

## 2023-09-27 DIAGNOSIS — Z01812 Encounter for preprocedural laboratory examination: Secondary | ICD-10-CM

## 2023-09-27 DIAGNOSIS — G894 Chronic pain syndrome: Secondary | ICD-10-CM | POA: Insufficient documentation

## 2023-09-27 DIAGNOSIS — R911 Solitary pulmonary nodule: Secondary | ICD-10-CM | POA: Diagnosis not present

## 2023-09-27 DIAGNOSIS — Z01810 Encounter for preprocedural cardiovascular examination: Secondary | ICD-10-CM | POA: Diagnosis not present

## 2023-09-27 DIAGNOSIS — M51369 Other intervertebral disc degeneration, lumbar region without mention of lumbar back pain or lower extremity pain: Secondary | ICD-10-CM | POA: Diagnosis not present

## 2023-09-27 DIAGNOSIS — I714 Abdominal aortic aneurysm, without rupture, unspecified: Secondary | ICD-10-CM | POA: Insufficient documentation

## 2023-09-27 DIAGNOSIS — R0602 Shortness of breath: Secondary | ICD-10-CM | POA: Diagnosis not present

## 2023-09-27 DIAGNOSIS — Z86718 Personal history of other venous thrombosis and embolism: Secondary | ICD-10-CM | POA: Diagnosis not present

## 2023-09-27 DIAGNOSIS — F319 Bipolar disorder, unspecified: Secondary | ICD-10-CM | POA: Insufficient documentation

## 2023-09-27 DIAGNOSIS — I700 Atherosclerosis of aorta: Secondary | ICD-10-CM | POA: Insufficient documentation

## 2023-09-27 DIAGNOSIS — M1990 Unspecified osteoarthritis, unspecified site: Secondary | ICD-10-CM | POA: Insufficient documentation

## 2023-09-27 DIAGNOSIS — I6523 Occlusion and stenosis of bilateral carotid arteries: Secondary | ICD-10-CM | POA: Insufficient documentation

## 2023-09-27 DIAGNOSIS — R590 Localized enlarged lymph nodes: Secondary | ICD-10-CM | POA: Insufficient documentation

## 2023-09-27 DIAGNOSIS — F419 Anxiety disorder, unspecified: Secondary | ICD-10-CM | POA: Insufficient documentation

## 2023-09-27 DIAGNOSIS — G4700 Insomnia, unspecified: Secondary | ICD-10-CM | POA: Insufficient documentation

## 2023-09-27 DIAGNOSIS — J449 Chronic obstructive pulmonary disease, unspecified: Secondary | ICD-10-CM | POA: Insufficient documentation

## 2023-09-27 DIAGNOSIS — I10 Essential (primary) hypertension: Secondary | ICD-10-CM | POA: Insufficient documentation

## 2023-09-27 DIAGNOSIS — I252 Old myocardial infarction: Secondary | ICD-10-CM | POA: Diagnosis not present

## 2023-09-27 DIAGNOSIS — Z951 Presence of aortocoronary bypass graft: Secondary | ICD-10-CM | POA: Insufficient documentation

## 2023-09-27 DIAGNOSIS — Z85118 Personal history of other malignant neoplasm of bronchus and lung: Secondary | ICD-10-CM | POA: Insufficient documentation

## 2023-09-27 DIAGNOSIS — Z01818 Encounter for other preprocedural examination: Secondary | ICD-10-CM | POA: Diagnosis present

## 2023-09-27 DIAGNOSIS — E785 Hyperlipidemia, unspecified: Secondary | ICD-10-CM | POA: Insufficient documentation

## 2023-09-27 HISTORY — DX: Long term (current) use of anticoagulants: Z79.01

## 2023-09-27 HISTORY — DX: Long term (current) use of aspirin: Z79.82

## 2023-09-27 HISTORY — DX: Malignant neoplasm of unspecified part of unspecified bronchus or lung: C34.90

## 2023-09-27 HISTORY — DX: Other ill-defined heart diseases: I51.89

## 2023-09-27 HISTORY — DX: Abdominal aortic aneurysm, without rupture, unspecified: I71.40

## 2023-09-27 LAB — BASIC METABOLIC PANEL
Anion gap: 8 (ref 5–15)
BUN: 17 mg/dL (ref 8–23)
CO2: 26 mmol/L (ref 22–32)
Calcium: 9.3 mg/dL (ref 8.9–10.3)
Chloride: 102 mmol/L (ref 98–111)
Creatinine, Ser: 0.77 mg/dL (ref 0.44–1.00)
GFR, Estimated: >60 mL/min (ref >60)
Glucose, Bld: 107 mg/dL — ABNORMAL HIGH (ref 70–99)
Potassium: 3.9 mmol/L (ref 3.5–5.1)
Sodium: 136 mmol/L (ref 135–145)

## 2023-09-27 LAB — CBC
HCT: 41.1 % (ref 36.0–46.0)
Hemoglobin: 14.1 g/dL (ref 12.0–15.0)
MCH: 30.3 pg (ref 26.0–34.0)
MCHC: 34.3 g/dL (ref 30.0–36.0)
MCV: 88.2 fL (ref 80.0–100.0)
Platelets: 278 10*3/uL (ref 150–400)
RBC: 4.66 MIL/uL (ref 3.87–5.11)
RDW: 12.6 % (ref 11.5–15.5)
WBC: 10.6 10*3/uL — ABNORMAL HIGH (ref 4.0–10.5)
nRBC: 0 % (ref 0.0–0.2)

## 2023-09-27 NOTE — Progress Notes (Signed)
Perioperative / Anesthesia Services  Pre-Admission Testing Clinical Review / Pre-Operative Anesthesia Consult  Date: 09/27/23  Patient Demographics:  Name: Melissa Montes DOB:   Jan 24, 1956 MRN:   [id number]  Planned Surgical Procedure(s):    Case: [id number] Date/Time: 09/30/23 0845   Procedure: ENDOBRONCHIAL ULTRASOUND (Bilateral)   Anesthesia type: General   Diagnosis: Lung nodule [R91.1]   Pre-op diagnosis: Mediastinal Lymphadenopathy   Location: [facility] PROCEDURE RM 02 / [facility] ORS FOR ANESTHESIA GROUP   Surgeons: Janann Colonel, MD     NOTE: Available PAT nursing documentation and vital signs have been reviewed. Clinical nursing staff has updated patient's PMH/PSHx, current medication list, and drug allergies/intolerances to ensure comprehensive history available to assist in medical decision making as it pertains to the aforementioned surgical procedure and anticipated anesthetic course. Extensive review of available clinical information personally performed. [place] PMH and PSHx updated with any diagnoses/procedures that  may have been inadvertently omitted during her intake with the pre-admission testing department's nursing staff.  Clinical Discussion:  Melissa Montes is a 67 y.o. female who is submitted for pre-surgical anesthesia review and clearance prior to her undergoing the above procedure.  Patient is a Former Smoker (41 pack years; quit 09/2018). Pertinent PMH includes: CAD (s/p 2 vessel CABG), MI x 2, SVT, AAA (s/p EVAR), saddle thrombus of abdominal aorta, BILATERAL carotid artery stenosis, [place], aortic atherosclerosis, HTN, HLD, dyspnea, COPD, lung cancer (? metastatic), nephrolithiasis, OA, lumbar DDD, chronic pain syndrome, ADD, depression, anxiety, bipolar disorder, insomnia.    Patient is followed by cardiology Welton Flakes, MD). She was last seen in the cardiology clinic on 08/27/2023; notes reviewed. At the time of her clinic visit,  patient complaining of  increasing episodes of intermittent shortness of breath. Patient denied any chest pain, PND, orthopnea, palpitations, significant peripheral edema, weakness, fatigue, vertiginous symptoms, or presyncope/syncope. Patient with a past medical history significant for cardiovascular diagnoses. Documented physical exam was grossly benign, providing no evidence of acute exacerbation and/or decompensation of the patient's known cardiovascular conditions.  Of note, complete records regarding patient's cardiovascular history unavailable for review at time of consult.  Information gathered from patient report and from notes provided by her local cardiologist.  Patient suffered an inferior STEMI on 09/21/2006.  Diagnostic LEFT heart catheterization was performed revealing multivessel CAD; 100% proximal RCA, 10% LM, 10% proximal LCx, 20% mid LAD, and 90% D1.  Patient underwent PCI placing a 3.0 x 23 mm Vision stent with the distal edge of that stent being overlapped with a 2.5 x 8 mm Mini-Vision stent due to a small edge dissection.  Procedure yielded excellent angiographic result and TIMI-3 flow.  Repeat diagnostic LEFT heart catheterization was performed on 06/17/2008 revealing multivessel CAD; 20% proximal RCA, 20% proximal to mid RCA, 30% LM, 30% LCx, 30% proximal LAD, 30% mid LAD, and 70% D1.  Interventional cardiology made the decision to defer further intervention opting for medical management.  Myocardial perfusion imaging study performed on 12/24/2009 revealed normal left ventricular systolic function with a normal EF.  There was no evidence of stress-induced myocardial ischemia or arrhythmia; no scintigraphic evidence of scar.  Study determined to be normal and low risk.  Patient reported to have suffered a second MI (type unknown) in 06/2018. Diagnostic LEFT heart catheterization performed on 07/07/2018 revealed multivessel CAD; 30% proximal and mid RCA and 80% mid LM.  Given the degree and complexity of  patient's coronary artery disease, the decision was made to refer patient to CVTS at [facility] for consideration  of revascularization procedure.  Patient underwent two-vessel revascularization on 07/17/2018.  LIMA-LAD, and SVG-OM1 bypass grafts were placed.  Patient with a known enlarging AAA.  She ultimately underwent EVAR repair with stent graft placement on 12/14/2020.  Following multiple cardiac events and older generation stent placement, patient remains on daily DAPT therapy using low-dose ASA and clopidogrel.  Patient is reportedly compliant with therapy with no reports of GI/GU related bleeding.  Blood pressure well-controlled at 118/73 mmHg on currently prescribed beta-blocker (metoprolol succinate) monotherapy.  Patient is on atorvastatin for her HLD diagnosis and further ASCVD prevention.  Patient has a supply of short acting nitrates (NTG) to use on a as needed basis for recurrent angina/anginal equivalent symptoms; denied recent use.  Patient is not diabetic.  She does not have an OSAH diagnosis.  Functional capacity limited by patient's increasing episodes of dyspnea and other multiple medical comorbidities.  With that said, patient is able to complete all of her ADLs/IADLs independently without cardiovascular limitation.  Per the DASI, patient is able to achieve at least 4 METS of physical activity without experiencing any significant degree of angina/anginal equivalent symptoms.  No changes were made to her medication regimen.  Given increase in respiratory symptoms, in the setting of significant cardiovascular history, repeat echocardiogram and Myoview was ordered.  Patient to follow-up with outpatient cardiology in 5 weeks or sooner if needed.  Since patient was last seen by cardiology, she has undergone the ordered functional study.  Myocardial perfusion imaging study is still pending completion.  Most recent TTE was performed on 09/04/2023 revealing a normal left ventricular systolic  function with a hyperdynamic LVEF of 70%.  There were no regional wall motion abnormalities. Left ventricular diastolic Doppler parameters consistent with abnormal relaxation (G1DD).  Right ventricular size and function was normal.  There was trivial tricuspid valve regurgitation. All transvalvular gradients were noted to be normal providing no evidence suggestive of valvular stenosis. Aorta normal in size with no evidence of aneurysmal dilatation.  Melissa Montes underwent low-dose CT scan for lung cancer screening on 08/27/2023.  Study revealed development of a RIGHT lower lobe pleural-based nodule with multiple areas of RIGHT-sided pleural thickening and infrahilar soft tissue fullness.  There was associated mediastinal lymphadenopathy noted.  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  Subsequent PET imaging was performed on 09/18/2023, however at the time of consult, interpretation of the study by radiology was still pending.  Patient was referred to pulmonary medicine (Assaker, MD) for discussions regarding further evaluation via bronchoscopy, which would allow for tissue sampling and definitive diagnosis. She has subsequently been scheduled for an ENDOBRONCHIAL ULTRASOUND (Bilateral) on 09/30/2023 with Dr. Janann Colonel, MD.  Given patient's past medical history significant for cardiovascular diagnoses, presurgical cardiac clearance was sought by the PAT team.  Per cardiology, "EKG is different from 12/31/22. If she is not having chest pain, bronchoscopy is low risk procedure, I would advise proceeding. I can see her after that next week Thursday at 9 am (10/03/2023)". Patient was contacted by PAT provider to discuss ECG changes and need for follow up with Dr. Welton Flakes next week.    Again, this patient is on daily DAPT therapy. She has been instructed on recommendations for holding her ASA and clopidogrel for 5 days prior to her procedure with  plans to restart as soon as postoperative bleeding risk felt to be minimized by her attending surgeon. The patient has been instructed that her last dose of her DAPT medications  should be on 09/24/2023.  Patient reports previous perioperative complications with anesthesia in the past. Patient has a PMH (+) for PONV. Symptoms and history of PONV will be discussed with patient by anesthesia team on the day of her procedure. Interventions will be ordered as deemed necessary based on patient's individual care needs as determined by anesthesiologist. In review of the available records, it is noted that patient underwent a general anesthetic course here at [facility] (ASA III) in 11/2020 without documented complications.      09/24/2023    2:20 PM 08/27/2023    3:19 PM 03/26/2023    1:13 PM  Vitals with BMI  Height 5\' 2"  5\' 2"  5\' 1"   Weight 118 lbs 10 oz 119 lbs 6 oz 122 lbs  BMI 21.69 21.83 23.06  Systolic 90 118 121  Diastolic 66 73 62  Pulse 89 75 95    Providers/Specialists:   NOTE: Primary physician provider listed below. Patient may have been seen by APP or partner within same practice.   PROVIDER ROLE / SPECIALTY LAST Aura Fey, MD Pulmonary Medicine (Surgeon) 09/24/2023  Margaretann Loveless, MD Primary Care Provider 02/01/2023  Adrian Blackwater, MD Cardiology 08/27/2023  Jomarie Longs, MD Psychiatry 09/25/2023  Edward Jolly, MD Pain Management 08/13/2023  Festus Barren, MD Vascular Surgery 02/12/2023   Allergies:  Effexor xr [venlafaxine hcl], Penicillins, Amoxicillin-pot clavulanate, and Keflex [cephalexin]  Current Home Medications:    aspirin EC 81 MG tablet   atorvastatin (LIPITOR) 80 MG tablet   Cholecalciferol (VITAMIN D3) 1.25 MG (50000 UT) capsule   clopidogrel (PLAVIX) 75 MG tablet   DULoxetine (CYMBALTA) 30 MG capsule   ezetimibe (ZETIA) 10 MG tablet   Glycopyrrolate-Formoterol (BEVESPI AEROSPHERE) 9-4.8 MCG/ACT AERO    hydrOXYzine (VISTARIL) 25 MG capsule   metoprolol succinate (TOPROL-XL) 50 MG 24 hr tablet   Multiple Vitamin (MULTIVITAMIN) tablet   nicotine polacrilex (NICORETTE) 4 MG gum   nitroGLYCERIN (NITROSTAT) 0.4 MG SL tablet   Probiotic Product (PROBIOTIC DAILY PO)   traMADol HCl 100 MG TABS   traZODone (DESYREL) 100 MG tablet   No current facility-administered medications for this encounter.   History:   Past Medical History:  Diagnosis Date   AAA (abdominal aortic aneurysm) (HCC)    a.) s/p EVAR 12/14/2020   ADD (attention deficit disorder)    a.) followed by psychiatry   Anxiety    Atherosclerosis of abdominal aorta (HCC)    Bilateral carotid artery stenosis    Bipolar disorder (HCC)    a.) followed by psychiatry   CAD (coronary artery disease)    a.) LHC/PCI 09/21/2006: 100pRCA (3.0 x 23 Vision with overlapping 2.5 x 8 mm Mini-Vision d/t sm edge dissection), 10% LM, 10% pLCx, 20% mLAD, 90%D1; b.) LHC 06/17/2008: 20% pRCA, 20% p-mRCA, 30% LM, 305 LCx, 30% pLAD, 30% mLAD, 70% D1 - med mgmt; c.) MV 12/24/2009: no ischemia; d.) LHC 07/07/2018: 30% p-mRCA, 80% mLM --> CVTS; e.) s/p 2v CABG (LIMA-LAD, SVG-OM1) 07/06/2018   Chronic hepatitis (HCC)    Chronic pain syndrome    a.) followed by pain management   Complication of anesthesia    a.) PONV   COPD (chronic obstructive pulmonary disease) (HCC)    DDD (degenerative disc disease), lumbar    Depression    Diastolic dysfunction    a.) TTE 10/16/20214: EF 70%, no RWMAs, G1DD, normal RVSF, triv TR   Diverticulitis    Dyspnea    History of kidney  stones    History of tobacco abuse    Hyperlipidemia    Hypertension    Insomnia    a.) takes trazodone PRN   Long term current use of aspirin    Lumbar spondylosis    Lung cancer (HCC)    Lung nodules    Mediastinal lymphadenopathy    MGUS (monoclonal gammopathy of unknown significance)    Migraine    Myocardial infarct (HCC) 2019   On long term clopidogrel therapy    Osteoporosis     PONV (postoperative nausea and vomiting)    PVD (peripheral vascular disease) (HCC)    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   a.) LIMA-LAD, SVG-OM1   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HCC)    ST elevation myocardial infarction (STEMI) of inferior wall (HCC) 09/21/2006   a.) PCI 09/21/2006: 100% pRCA --> 3.0 x 23 vision with distal edge overlapped by a 2.5 x 8 mm Mini-Vision stent  due to a small edge dissection   SVT (supraventricular tachycardia) (HCC)    Syncope and collapse    Unstable angina (HCC)    Wears partial dentures    Past Surgical History:  Procedure Laterality Date   ABDOMINAL HYSTERECTOMY     CAROTID ANGIOGRAPHY N/A 01/13/2018   Procedure: CAROTID ANGIOGRAPHY;  Surgeon: Annice Needy, MD;  Location: [facility] INVASIVE CV LAB;  Service: Cardiovascular;  Laterality: N/A;   COLONOSCOPY     CORONARY ANGIOPLASTY WITH STENT PLACEMENT Left 09/21/2006   Procedure: CORONARY ANGIOPLASTY WITH STENT PLACEMENT; Location: [facility]; Surgeon: Jerrell Mylar, MD   CORONARY ARTERY BYPASS GRAFT N/A 07/07/2018   Procedure: CORONARY ARTERY BYPASS GRAFT; Location: [facility]; Surgeon: Cherlyn Labella, MD   ENDOVASCULAR REPAIR/STENT GRAFT Bilateral 12/14/2020   Procedure: ENDOVASCULAR REPAIR/STENT GRAFT;  Surgeon: Annice Needy, MD;  Location: [facility] INVASIVE CV LAB;  Service: Cardiovascular;  Laterality: Bilateral;   iv INJECTIONS TO BACK     LEFT HEART CATH Right 07/07/2018   Procedure: Left Heart Cath and Coronary Angiography;  Surgeon: Laurier Nancy, MD;  Location: [facility] INVASIVE CV LAB;  Service: Cardiovascular;  Laterality: Right;   LEFT HEART CATH AND CORONARY ANGIOGRAPHY Left 06/17/2008   Procedure: LEFT HEART CATH AND CORONARY ANGIOGRAPHY; Location: [facility]; Surgeon: Cherlyn Labella, MD   LEFT HEART CATH AND CORONARY ANGIOGRAPHY Left 07/07/2018   Procedure: LEFT HEART CATH AND CORONARY ANGIOGRAPHY; Location: [facility]   Family History  Problem Relation Age of Onset   Breast cancer Mother 41   Breast cancer Maternal Aunt         mat aunt and great aunt   Depression Sister    Social History   Tobacco Use   Smoking status: Former    Current packs/day: 0.00    Average packs/day: 1 pack/day for 43.8 years (43.8 ttl pk-yrs)    Types: Cigarettes    Start date: 34    Quit date: 09/19/2018    Years since quitting: 5.0   Smokeless tobacco: Never   Tobacco comments:    uses nicotine gums  Vaping Use   Vaping status: Never Used  Substance Use Topics   Alcohol use: Yes    Comment: rare   Drug use: No    Pertinent Clinical Results:  LABS:   Hospital Outpatient Visit on 09/27/2023  Component Date Value Ref Range Status   WBC 09/27/2023 10.6 (H)  4.0 - 10.5 K/uL Final   RBC 09/27/2023 4.66  3.87 - 5.11 MIL/uL Final   Hemoglobin 09/27/2023 14.1  12.0 - 15.0 g/dL  Final   HCT 09/27/2023 41.1  36.0 - 46.0 % Final   MCV 09/27/2023 88.2  80.0 - 100.0 fL Final   MCH 09/27/2023 30.3  26.0 - 34.0 pg Final   MCHC 09/27/2023 34.3  30.0 - 36.0 g/dL Final   RDW 16/08/9603 12.6  11.5 - 15.5 % Final   Platelets 09/27/2023 278  150 - 400 K/uL Final   nRBC 09/27/2023 0.0  0.0 - 0.2 % Final   Performed at [facility], [address] [place], [place] [address]   Sodium 09/27/2023 136  135 - 145 mmol/L Final   Potassium 09/27/2023 3.9  3.5 - 5.1 mmol/L Final   Chloride 09/27/2023 102  98 - 111 mmol/L Final   CO2 09/27/2023 26  22 - 32 mmol/L Final   Glucose, Bld 09/27/2023 107 (H)  70 - 99 mg/dL Final   Glucose reference range applies only to samples taken after fasting for at least 8 hours.   BUN 09/27/2023 17  8 - 23 mg/dL Final   Creatinine, Ser 09/27/2023 0.77  0.44 - 1.00 mg/dL Final   Calcium 11/91/4782 9.3  8.9 - 10.3 mg/dL Final   GFR, Estimated 09/27/2023 >60  >60 mL/min Final   Comment: (NOTE) Calculated using the CKD-EPI Creatinine Equation (2021)    Anion gap 09/27/2023 8  5 - 15 Final   Performed at [facility], [address] [place], [place] [address]    ECG: Date:  09/27/2023 Time ECG obtained: 1426 PM Rate: 70 bpm Rhythm: normal sinus Axis (leads I and aVF): Normal Intervals: PR 132 ms. QRS 78 ms. QTc 412 ms. ST segment and T wave changes: Diffuse ST and T wave abnormalities noted in the inferior and anterolateral leads.  Comparison: Significant changes have occurred when compared to tracing obtained on 12/31/2020. Reviewed EKG with primary cardiologist Welton Flakes, MD). Plans are for in office follow up next week.    IMAGING / PROCEDURES: NM PET IMAGE INITIAL (PI) SKULL BASE TO THIGH performed on 09/18/2023 -Pending radiology interpretation as of 1530 on 09/27/2023  CT CHEST LUNG CA SCREEN LOW DOSE W/O CM performed on 08/27/2023 Lung-RADS 4X, highly suspicious. Additional imaging evaluation or consultation with Pulmonology or Thoracic Surgery recommended. Development of a right lower lobe pleural-based dominant nodule, multiple areas of right-sided pleural thickening, and right infrahilar soft tissue fullness (likely adenopathy). 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 Incidental findings, including:  Cholelithiasis.  Aortic atherosclerosis  Emphysema Left nephrolithiasis  TRANSTHORACIC ECHOCARDIOGRAM performed on 09/04/2023 Technically adequate study.  Normal chamber sizes.  Normal left ventricular systolic function.  Mild left ventricular hypertrophy with GRADE 1 (relaxation abnormality) diastolic dysfunction.  Normal right ventricular systolic function.  Normal right ventricular diastolic function.  Normal left ventricular wall motion.  Normal right ventricular wall motion.  Trace tricuspid regurgitation.  Normal pulmonary artery pressure.  Trace mitral regurgitation.  No pericardial effusion.   VAS US CAROTID performed on 02/12/2023 Velocities in the right ICA are consistent with a 1-39% stenosis.  Velocities in the left ICA are consistent with a 1-39% stenosis.  Bilateral  vertebral arteries demonstrate antegrade flow.  Normal flow hemodynamics were seen in bilateral subclavian arteries.   VAS [place] EVAR DUPLEX performed on 02/12/2023 Patent endovascular aneurysm repair with no evidence of endoleak.  The largest aortic diameter remains essentially unchanged compared to prior exam.  Previous diameter measurement was 3.6 cm obtained on 02/16/2022.   CORONARY ARTERY BYPASS GRAFTING performed on  07/16/2018 2 vessel revascularization LIMA-LAD SVG-OM1  LEFT HEART CATHETERIZATION AND CORONARY ANGIOGRAPHY performed on 07/07/2018 High grade lesion in distal LM with stents in mid RCA. 30% p-mRCA 80% mLM No significant restenosis in RCA Normal LVEF.  Advise [facility]G at [facility]    Impression and Plan:  Chalena Poppy Loughry has been referred for pre-anesthesia review and clearance prior to her undergoing the planned anesthetic and procedural courses. Available labs, pertinent testing, and imaging results were personally reviewed by me in preparation for upcoming operative/procedural course. [facility] medical record has been updated following extensive record review and patient interview with PAT staff.   This patient has been appropriately cleared by cardiology with an overall ACCEPTABLE risk of experiencing significant perioperative cardiovascular complications. Based on clinical review performed today (09/27/23), barring any significant acute changes in the patient's overall condition, it is anticipated that she will be able to proceed with the planned surgical intervention. Any acute changes in clinical condition may necessitate her procedure being postponed and/or cancelled. Patient will meet with anesthesia team (MD and/or CRNA) on the day of her procedure for preoperative evaluation/assessment. Questions regarding anesthetic course will be fielded at that time.   Pre-surgical instructions were reviewed with the patient during her PAT appointment, and questions were fielded  to satisfaction by PAT clinical staff. She has been instructed on which medications that she will need to hold prior to surgery, as well as the ones that have been deemed safe/appropriate to take on the day of her procedure. As part of the general education provided by PAT, patient made aware both verbally and in writing, that she would need to abstain from the use of any illegal substances during her perioperative course.  She was advised that failure to follow the provided instructions could necessitate case cancellation or result in serious perioperative complications up to and including death. Patient encouraged to contact PAT and/or her surgeon's office to discuss any questions or concerns that may arise prior to surgery; verbalized understanding.   Quentin Mulling, MSN, APRN, FNP-C, CEN [facility]  Perioperative Services Nurse Practitioner Phone: [phone] Fax: [phone] 09/27/23 3:32 PM  NOTE: This note has been prepared using Dragon dictation software. Despite my best ability to proofread, there is always the potential that unintentional transcriptional errors may still occur from this process.

## 2023-09-29 MED ORDER — CHLORHEXIDINE GLUCONATE 0.12 % MT SOLN
15.0000 mL | Freq: Once | OROMUCOSAL | Status: AC
  Administered 2023-09-30: 15 mL via OROMUCOSAL

## 2023-09-29 MED ORDER — LACTATED RINGERS IV SOLN
INTRAVENOUS | Status: DC
Start: 2023-09-29 — End: 2023-09-30

## 2023-09-29 MED ORDER — ORAL CARE MOUTH RINSE: 15.0000 mL | Freq: Once | OROMUCOSAL | Status: AC

## 2023-09-30 ENCOUNTER — Encounter
Admission: RE | Disposition: A | Discharge status: Home / Self Care | Payer: Self-pay | Source: Home / Self Care | Attending: Pulmonary Disease

## 2023-09-30 ENCOUNTER — Ambulatory Visit: Payer: Medicare HMO | Admitting: Urgent Care

## 2023-09-30 ENCOUNTER — Ambulatory Visit
Admission: RE | Admit: 2023-09-30 | Discharge: 2023-09-30 | Disposition: A | Discharge status: Home / Self Care | Payer: Medicare HMO | Attending: Pulmonary Disease | Admitting: Pulmonary Disease

## 2023-09-30 ENCOUNTER — Encounter: Payer: Self-pay | Admitting: Pulmonary Disease

## 2023-09-30 ENCOUNTER — Other Ambulatory Visit: Payer: Self-pay

## 2023-09-30 ENCOUNTER — Ambulatory Visit: Payer: Medicare HMO

## 2023-09-30 DIAGNOSIS — I700 Atherosclerosis of aorta: Secondary | ICD-10-CM | POA: Diagnosis not present

## 2023-09-30 DIAGNOSIS — I471 Supraventricular tachycardia, unspecified: Secondary | ICD-10-CM | POA: Diagnosis not present

## 2023-09-30 DIAGNOSIS — I509 Heart failure, unspecified: Secondary | ICD-10-CM | POA: Insufficient documentation

## 2023-09-30 DIAGNOSIS — J449 Chronic obstructive pulmonary disease, unspecified: Secondary | ICD-10-CM | POA: Insufficient documentation

## 2023-09-30 DIAGNOSIS — Z87891 Personal history of nicotine dependence: Secondary | ICD-10-CM | POA: Diagnosis not present

## 2023-09-30 DIAGNOSIS — Z85118 Personal history of other malignant neoplasm of bronchus and lung: Secondary | ICD-10-CM | POA: Insufficient documentation

## 2023-09-30 DIAGNOSIS — R918 Other nonspecific abnormal finding of lung field: Secondary | ICD-10-CM | POA: Diagnosis not present

## 2023-09-30 DIAGNOSIS — I2510 Atherosclerotic heart disease of native coronary artery without angina pectoris: Secondary | ICD-10-CM | POA: Insufficient documentation

## 2023-09-30 DIAGNOSIS — I714 Abdominal aortic aneurysm, without rupture, unspecified: Secondary | ICD-10-CM | POA: Insufficient documentation

## 2023-09-30 DIAGNOSIS — R911 Solitary pulmonary nodule: Secondary | ICD-10-CM | POA: Diagnosis not present

## 2023-09-30 DIAGNOSIS — C771 Secondary and unspecified malignant neoplasm of intrathoracic lymph nodes: Secondary | ICD-10-CM | POA: Diagnosis not present

## 2023-09-30 DIAGNOSIS — I110 Hypertensive heart disease with heart failure: Secondary | ICD-10-CM | POA: Insufficient documentation

## 2023-09-30 DIAGNOSIS — Z48813 Encounter for surgical aftercare following surgery on the respiratory system: Secondary | ICD-10-CM | POA: Diagnosis not present

## 2023-09-30 DIAGNOSIS — I6523 Occlusion and stenosis of bilateral carotid arteries: Secondary | ICD-10-CM | POA: Diagnosis not present

## 2023-09-30 DIAGNOSIS — Z955 Presence of coronary angioplasty implant and graft: Secondary | ICD-10-CM | POA: Diagnosis not present

## 2023-09-30 DIAGNOSIS — G4700 Insomnia, unspecified: Secondary | ICD-10-CM | POA: Insufficient documentation

## 2023-09-30 DIAGNOSIS — Z7982 Long term (current) use of aspirin: Secondary | ICD-10-CM | POA: Insufficient documentation

## 2023-09-30 DIAGNOSIS — R590 Localized enlarged lymph nodes: Secondary | ICD-10-CM | POA: Diagnosis not present

## 2023-09-30 DIAGNOSIS — I252 Old myocardial infarction: Secondary | ICD-10-CM | POA: Insufficient documentation

## 2023-09-30 DIAGNOSIS — Z7902 Long term (current) use of antithrombotics/antiplatelets: Secondary | ICD-10-CM | POA: Diagnosis not present

## 2023-09-30 HISTORY — PX: ENDOBRONCHIAL ULTRASOUND: SHX5096

## 2023-09-30 LAB — BODY FLUID CELL COUNT WITH DIFFERENTIAL
Eos, Fluid: 5 %
Lymphs, Fluid: 30 %
Monocyte-Macrophage-Serous Fluid: 51 % (ref 50–90)
Neutrophil Count, Fluid: 13 % (ref 0–25)
Other Cells, Fluid: 1 %
Total Nucleated Cell Count, Fluid: 36 uL (ref 0–1000)

## 2023-09-30 SURGERY — ENDOBRONCHIAL ULTRASOUND
Anesthesia: General | Laterality: Bilateral

## 2023-09-30 MED ORDER — DEXAMETHASONE SODIUM PHOSPHATE 10 MG/ML IJ SOLN
INTRAMUSCULAR | Status: DC | PRN
  Administered 2023-09-30: 10 mg via INTRAVENOUS

## 2023-09-30 MED ORDER — DEXAMETHASONE SODIUM PHOSPHATE 10 MG/ML IJ SOLN
INTRAMUSCULAR | Status: AC
  Filled 2023-09-30: qty 1

## 2023-09-30 MED ORDER — ONDANSETRON HCL 4 MG/2ML IJ SOLN
INTRAMUSCULAR | Status: AC
  Filled 2023-09-30: qty 2

## 2023-09-30 MED ORDER — OXYCODONE HCL 5 MG/5ML PO SOLN: 5.0000 mg | Freq: Once | ORAL | Status: DC | PRN

## 2023-09-30 MED ORDER — PHENYLEPHRINE 80 MCG/ML (10ML) SYRINGE FOR IV PUSH (FOR BLOOD PRESSURE SUPPORT)
PREFILLED_SYRINGE | INTRAVENOUS | Status: DC | PRN
  Administered 2023-09-30 (×2): 80 ug via INTRAVENOUS

## 2023-09-30 MED ORDER — FENTANYL CITRATE (PF) 100 MCG/2ML IJ SOLN
INTRAMUSCULAR | Status: DC | PRN
  Administered 2023-09-30 (×2): 50 ug via INTRAVENOUS

## 2023-09-30 MED ORDER — PROPOFOL 10 MG/ML IV BOLUS
INTRAVENOUS | Status: DC | PRN
  Administered 2023-09-30: 100 mg via INTRAVENOUS

## 2023-09-30 MED ORDER — FENTANYL CITRATE (PF) 100 MCG/2ML IJ SOLN: 25.0000 ug | INTRAMUSCULAR | Status: DC | PRN

## 2023-09-30 MED ORDER — ROCURONIUM BROMIDE 100 MG/10ML IV SOLN
INTRAVENOUS | Status: DC | PRN
  Administered 2023-09-30: 50 mg via INTRAVENOUS

## 2023-09-30 MED ORDER — DEXMEDETOMIDINE HCL IN NACL 80 MCG/20ML IV SOLN
INTRAVENOUS | Status: AC
  Filled 2023-09-30: qty 20

## 2023-09-30 MED ORDER — MIDAZOLAM HCL 2 MG/2ML IJ SOLN
INTRAMUSCULAR | Status: AC
  Filled 2023-09-30: qty 2

## 2023-09-30 MED ORDER — LIDOCAINE HCL (PF) 2 % IJ SOLN
INTRAMUSCULAR | Status: AC
  Filled 2023-09-30: qty 5

## 2023-09-30 MED ORDER — CHLORHEXIDINE GLUCONATE 0.12 % MT SOLN
OROMUCOSAL | Status: AC
  Filled 2023-09-30: qty 15

## 2023-09-30 MED ORDER — SUGAMMADEX SODIUM 200 MG/2ML IV SOLN
INTRAVENOUS | Status: DC | PRN
  Administered 2023-09-30: 200 mg via INTRAVENOUS

## 2023-09-30 MED ORDER — LIDOCAINE HCL (CARDIAC) PF 100 MG/5ML IV SOSY
PREFILLED_SYRINGE | INTRAVENOUS | Status: DC | PRN
  Administered 2023-09-30: 60 mg via INTRAVENOUS

## 2023-09-30 MED ORDER — PROPOFOL 10 MG/ML IV BOLUS
INTRAVENOUS | Status: AC
Start: 2023-09-30 — End: ?
  Filled 2023-09-30: qty 20

## 2023-09-30 MED ORDER — PHENYLEPHRINE 80 MCG/ML (10ML) SYRINGE FOR IV PUSH (FOR BLOOD PRESSURE SUPPORT)
PREFILLED_SYRINGE | INTRAVENOUS | Status: AC
  Filled 2023-09-30: qty 10

## 2023-09-30 MED ORDER — ONDANSETRON HCL 4 MG/2ML IJ SOLN
INTRAMUSCULAR | Status: DC | PRN
  Administered 2023-09-30: 4 mg via INTRAVENOUS

## 2023-09-30 MED ORDER — MIDAZOLAM HCL 2 MG/2ML IJ SOLN
INTRAMUSCULAR | Status: DC | PRN
  Administered 2023-09-30: 2 mg via INTRAVENOUS

## 2023-09-30 MED ORDER — ROCURONIUM BROMIDE 10 MG/ML (PF) SYRINGE
PREFILLED_SYRINGE | INTRAVENOUS | Status: AC
Start: 2023-09-30 — End: ?
  Filled 2023-09-30: qty 10

## 2023-09-30 MED ORDER — OXYCODONE HCL 5 MG PO TABS: 5.0000 mg | ORAL_TABLET | Freq: Once | ORAL | Status: DC | PRN

## 2023-09-30 MED ORDER — FENTANYL CITRATE (PF) 100 MCG/2ML IJ SOLN
INTRAMUSCULAR | Status: AC
  Filled 2023-09-30: qty 2

## 2023-09-30 NOTE — Addendum Note (Signed)
Addendum  created 09/30/23 1113 by Omer Jack, CRNA   Intraprocedure Meds edited

## 2023-09-30 NOTE — Op Note (Signed)
Flexible and EBUS Bronchoscopy Procedure Note  Floye Schnaidt  [id number]  10/17/1956  Date:09/30/23  Time:10:20 AM   Provider Performing:Jean-Pierre Kinda Pottle   Procedure: Flexible bronchoscopy and EBUS Bronchoscopy  Indication(s) Mediastinal Lymphadenopathy  Consent Risks of the procedure as well as the alternatives and risks of each were explained to the patient and/or caregiver.  Consent for the procedure was obtained.  Anesthesia General Anesthesia  Time Out Verified patient identification, verified procedure, site/side was marked, verified correct patient position, special equipment/implants available, medications/allergies/relevant history reviewed, required imaging and test results available.  Sterile Technique Usual hand hygiene, masks, gowns, and gloves were used  Procedure Description Diagnostic bronchoscope advanced through endotracheal tube and into airway.  Airways were examined down to subsegmental level with findings noted below.  Following diagnostic evaluation, the diagnostic bronchoscope was then removed and the EBUS bronchoscope was advanced into airway with stations 7 and 11Ri biopsied and sent for slide, cell block and flow cytometry. The EBUS bronchoscope was removed after assuring no active bleeding from biopsy site.  Findings:   Diagnostic bronchoscopy: Carina appeared sharp without any suspicious lesions. RM, RUL,RML and RLL appeared normal and no endobronchial lesions noted.   EBUS: Station 7 appeared enlarged and lacked central hyaline structure. This was biopsied with vizishot 21G. Station 11Ri noted to be enlarged and was biopsied with vizishot 21G. The upper lobe FDG avid hilar node was seen at station 11Rs right under the PA and this was not biopsied.   BAL: RML 90cc of NS instilled and 40cc alliquot returned and sent for cytology and micro.   Complications/Tolerance None; patient tolerated the procedure well. Chest X-ray is needed post  procedure.  EBL Minimal  Janann Colonel, MD [facility] 09/30/2023 10:27 AM

## 2023-09-30 NOTE — Anesthesia Preprocedure Evaluation (Signed)
Anesthesia Evaluation  Patient identified by MRN, date of birth, ID band Patient awake    Reviewed: Allergy & Precautions, H&P , NPO status , Patient's Chart, lab work & pertinent test results, reviewed documented beta blocker date and time   History of Anesthesia Complications (+) PONV and history of anesthetic complications  Airway Mallampati: II  TM Distance: >3 FB Neck ROM: full  Mouth opening: Limited Mouth Opening  Dental  (+) Dental Advidsory Given, Loose, Missing, Poor Dentition   Pulmonary with exertion, neg sleep apnea, COPD,  COPD inhaler, neg recent URI, Patient abstained from smoking., former smoker   Pulmonary exam normal breath sounds clear to auscultation       Cardiovascular Exercise Tolerance: Good hypertension, (-) angina + CAD, + Past MI, + Cardiac Stents, + Peripheral Vascular Disease and +CHF  Normal cardiovascular exam(-) dysrhythmias (-) Valvular Problems/Murmurs Rhythm:regular Rate:Normal     Neuro/Psych  PSYCHIATRIC DISORDERS Anxiety Depression Bipolar Disorder   negative neurological ROS     GI/Hepatic negative GI ROS,,,(+) Hepatitis -  Endo/Other  negative endocrine ROS    Renal/GU Renal disease     Musculoskeletal   Abdominal   Peds  Hematology negative hematology ROS (+)   Anesthesia Other Findings Past Medical History: No date: AAA (abdominal aortic aneurysm) (HCC)     Comment:  a.) s/p EVAR 12/14/2020 No date: ADD (attention deficit disorder)     Comment:  a.) followed by psychiatry No date: Anxiety No date: Atherosclerosis of abdominal aorta (HCC) No date: Bilateral carotid artery stenosis No date: Bipolar disorder (HCC)     Comment:  a.) followed by psychiatry No date: CAD (coronary artery disease)     Comment:  a.) LHC/PCI 09/21/2006: 100pRCA (3.0 x 23 Vision with               overlapping 2.5 x 8 mm Mini-Vision d/t sm edge               dissection), 10% LM, 10% pLCx, 20%  mLAD, 90%D1; b.) LHC               06/17/2008: 20% pRCA, 20% p-mRCA, 30% LM, 305 LCx, 30%               pLAD, 30% mLAD, 70% D1 - med mgmt; c.) MV 12/24/2009: no               ischemia; d.) LHC 07/07/2018: 30% p-mRCA, 80% mLM -->               CVTS; e.) s/p 2v CABG (LIMA-LAD, SVG-OM1) 07/06/2018 No date: Chronic hepatitis (HCC) No date: Chronic pain syndrome     Comment:  a.) followed by pain management No date: Complication of anesthesia     Comment:  a.) PONV No date: COPD (chronic obstructive pulmonary disease) (HCC) No date: DDD (degenerative disc disease), lumbar No date: Depression No date: Diastolic dysfunction     Comment:  a.) TTE 10/16/20214: EF 70%, no RWMAs, G1DD, normal               RVSF, triv TR No date: Diverticulitis No date: Dyspnea No date: History of kidney stones No date: History of tobacco abuse No date: Hyperlipidemia No date: Hypertension No date: Insomnia     Comment:  a.) takes trazodone PRN No date: Long term current use of aspirin No date: Lumbar spondylosis No date: Lung cancer (HCC) No date: Lung nodules No date: Mediastinal lymphadenopathy No date: MGUS (monoclonal gammopathy of unknown  significance) No date: Migraine 2019: Myocardial infarct (HCC) No date: On long term clopidogrel therapy No date: Osteoporosis No date: PONV (postoperative nausea and vomiting) No date: PVD (peripheral vascular disease) (HCC) 07/16/2018: S/P CABG x 2     Comment:  a.) LIMA-LAD, SVG-OM1 No date: 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/2006: ST elevation myocardial infarction (STEMI) of inferior  wall (HCC)     Comment:  a.) PCI 09/21/2006: 100% pRCA --> 3.0 x 23 vision with               distal edge overlapped by a 2.5 x 8 mm Mini-Vision stent               due to a small edge dissection No date: SVT (supraventricular tachycardia) (HCC) No date: Syncope and collapse No date: Unstable angina (HCC) No date: Wears partial dentures  Past Surgical  History: No date: ABDOMINAL HYSTERECTOMY 01/13/2018: CAROTID ANGIOGRAPHY; N/A     Comment:  Procedure: CAROTID ANGIOGRAPHY;  Surgeon: Annice Needy,               MD;  Location: [facility];  Service:               Cardiovascular;  Laterality: N/A; No date: COLONOSCOPY 09/21/2006: CORONARY ANGIOPLASTY WITH STENT PLACEMENT; Left     Comment:  Procedure: CORONARY ANGIOPLASTY WITH STENT PLACEMENT;               Location: [facility]; Surgeon: Jerrell Mylar, MD 07/07/2018: CORONARY ARTERY BYPASS GRAFT; N/A     Comment:  Procedure: CORONARY ARTERY BYPASS GRAFT; Location: [facility];              Surgeon: Cherlyn Labella, MD 12/14/2020: ENDOVASCULAR REPAIR/STENT GRAFT; Bilateral     Comment:  Procedure: ENDOVASCULAR REPAIR/STENT GRAFT;  Surgeon:               Annice Needy, MD;  Location: [facility];                Service: Cardiovascular;  Laterality: Bilateral; No date: iv INJECTIONS TO BACK 07/07/2018: LEFT HEART CATH; Right     Comment:  Procedure: Left Heart Cath and Coronary Angiography;                Surgeon: Laurier Nancy, MD;  Location: [facility]              [facility];  Service: Cardiovascular;  Laterality: Right; 06/17/2008: LEFT HEART CATH AND CORONARY ANGIOGRAPHY; Left     Comment:  Procedure: LEFT HEART CATH AND CORONARY ANGIOGRAPHY;               Location: [facility]; Surgeon: Cherlyn Labella, MD 07/07/2018: LEFT HEART CATH AND CORONARY ANGIOGRAPHY; Left     Comment:  Procedure: LEFT HEART CATH AND CORONARY ANGIOGRAPHY;               Location: [facility]     Reproductive/Obstetrics negative OB ROS                             Anesthesia Physical Anesthesia Plan  ASA: 3  Anesthesia Plan: General ETT and General   Post-op Pain Management:    Induction: Intravenous  PONV Risk Score and Plan: Midazolam, Ondansetron and Dexamethasone  Airway Management Planned: Oral ETT  Additional Equipment:   Intra-op Plan:   Post-operative Plan: Extubation in  OR  Informed Consent: I have reviewed the  patients History and Physical, chart, labs and discussed the procedure including the risks, benefits and alternatives for the proposed anesthesia with the patient or authorized representative who has indicated his/her understanding and acceptance.     Dental Advisory Given  Plan Discussed with: Anesthesiologist, CRNA and Surgeon  Anesthesia Plan Comments: (Patient consented for risks of anesthesia including but not limited to:  - adverse reactions to medications - damage to eyes, teeth, lips or other oral mucosa - nerve damage due to positioning  - sore throat or hoarseness - Damage to heart, brain, nerves, lungs, other parts of body or loss of life  Patient voiced understanding and assent.)        Anesthesia Quick Evaluation

## 2023-09-30 NOTE — Transfer of Care (Signed)
Immediate Anesthesia Transfer of Care Note  Patient: Melissa Montes  Procedure(s) Performed: ENDOBRONCHIAL ULTRASOUND (Bilateral)  Patient Location: PACU  Anesthesia Type:General  Level of Consciousness: drowsy and patient cooperative  Airway & Oxygen Therapy: Patient Spontanous Breathing and Patient connected to nasal cannula oxygen  Post-op Assessment: Report given to RN and Post -op Vital signs reviewed and stable  Post vital signs: Reviewed and stable  Last Vitals:  Vitals Value Taken Time  BP 114/68 09/30/23 1015  Temp 36.1 C 09/30/23 1010  Pulse 70 09/30/23 1016  Resp 15 09/30/23 1016  SpO2 98 % 09/30/23 1016  Vitals shown include unfiled device data.  Last Pain:  Vitals:   09/30/23 1015  TempSrc:   PainSc: Asleep         Complications: No notable events documented.

## 2023-09-30 NOTE — Anesthesia Postprocedure Evaluation (Signed)
Anesthesia Post Note  Patient: Melissa Montes  Procedure(s) Performed: ENDOBRONCHIAL ULTRASOUND (Bilateral)  Patient location during evaluation: PACU Anesthesia Type: General Level of consciousness: awake and alert Pain management: pain level controlled Vital Signs Assessment: post-procedure vital signs reviewed and stable Respiratory status: spontaneous breathing, nonlabored ventilation, respiratory function stable and patient connected to nasal cannula oxygen Cardiovascular status: blood pressure returned to baseline and stable Postop Assessment: no apparent nausea or vomiting Anesthetic complications: no  No notable events documented.   Last Vitals:  Vitals:   09/30/23 1010 09/30/23 1015  BP: 129/72 114/68  Pulse: 76 72  Resp: 18 14  Temp: (!) 36.1 C   SpO2: 97% 98%    Last Pain:  Vitals:   09/30/23 1015  TempSrc:   PainSc: Asleep                 Stephanie Coup

## 2023-09-30 NOTE — Interval H&P Note (Signed)
Patient here for an EBUS with [facility]NA and Diagnostic Bronchoscopy. Risks and benefits discussed and appropriate for the procedure.

## 2023-09-30 NOTE — Anesthesia Procedure Notes (Signed)
Procedure Name: Intubation Date/Time: 09/30/2023 9:07 AM  Performed by: Omer Jack, CRNAPre-anesthesia Checklist: Patient identified, Patient being monitored, Timeout performed, Emergency Drugs available and Suction available Patient Re-evaluated:Patient Re-evaluated prior to induction Oxygen Delivery Method: Circle system utilized Preoxygenation: Pre-oxygenation with 100% oxygen Induction Type: IV induction Ventilation: Mask ventilation without difficulty Laryngoscope Size: 3 and McGraph Grade View: Grade I Tube type: Oral Tube size: 8.0 mm Number of attempts: 2 Airway Equipment and Method: Stylet Placement Confirmation: ETT inserted through vocal cords under direct vision, positive ETCO2 and breath sounds checked- equal and bilateral Secured at: 21 cm Tube secured with: Tape Dental Injury: Teeth and Oropharynx as per pre-operative assessment

## 2023-10-01 ENCOUNTER — Encounter: Payer: Self-pay | Admitting: Cardiovascular Disease

## 2023-10-02 ENCOUNTER — Telehealth: Payer: Self-pay

## 2023-10-02 ENCOUNTER — Other Ambulatory Visit: Payer: Self-pay | Admitting: Pathology

## 2023-10-02 DIAGNOSIS — R911 Solitary pulmonary nodule: Secondary | ICD-10-CM

## 2023-10-02 LAB — CYTOLOGY - NON PAP

## 2023-10-02 NOTE — Telephone Encounter (Signed)
Per secure chat from Dr. 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  Oncology referral has been placed. Nothing further needed.

## 2023-10-03 ENCOUNTER — Encounter: Payer: Self-pay | Admitting: Pulmonary Disease

## 2023-10-03 LAB — CULTURE, RESPIRATORY W GRAM STAIN: Culture: NORMAL

## 2023-10-03 LAB — ACID FAST SMEAR (AFB, MYCOBACTERIA): Acid Fast Smear: NEGATIVE

## 2023-10-04 ENCOUNTER — Encounter: Payer: Self-pay | Admitting: Cardiovascular Disease

## 2023-10-04 ENCOUNTER — Ambulatory Visit (INDEPENDENT_AMBULATORY_CARE_PROVIDER_SITE_OTHER): Payer: Medicare HMO | Admitting: Cardiovascular Disease

## 2023-10-04 VITALS — BP 118/84 | HR 101 | Ht 62.0 in | Wt 118.2 lb

## 2023-10-04 DIAGNOSIS — Z955 Presence of coronary angioplasty implant and graft: Secondary | ICD-10-CM

## 2023-10-04 DIAGNOSIS — I10 Essential (primary) hypertension: Secondary | ICD-10-CM | POA: Diagnosis not present

## 2023-10-04 DIAGNOSIS — E782 Mixed hyperlipidemia: Secondary | ICD-10-CM | POA: Diagnosis not present

## 2023-10-04 DIAGNOSIS — I2510 Atherosclerotic heart disease of native coronary artery without angina pectoris: Secondary | ICD-10-CM

## 2023-10-04 DIAGNOSIS — I739 Peripheral vascular disease, unspecified: Secondary | ICD-10-CM

## 2023-10-04 NOTE — Progress Notes (Signed)
Cardiology Office Note   Date:  10/04/2023   ID:  Melissa Montes, DOB 1956/04/18, MRN [id number]  PCP:  Margaretann Loveless, MD  Cardiologist:  Adrian Blackwater, MD      History of Present Illness: Melissa Montes is a 67 y.o. female who presents for  Chief Complaint  Patient presents with   Follow-up    Abnormal EKG    Has SOB, on minimal exertion but no chest pain. Has lung cancer and EKG was done which showed new ischaemic T wave inversion inferior and anterior wall. Rhina Brackett for evaluation from [facility]      Past Medical History:  Diagnosis Date   AAA (abdominal aortic aneurysm) (HCC)    a.) s/p EVAR 12/14/2020   ADD (attention deficit disorder)    a.) followed by psychiatry   Anxiety    Atherosclerosis of abdominal aorta (HCC)    Bilateral carotid artery stenosis    Bipolar disorder (HCC)    a.) followed by psychiatry   CAD (coronary artery disease)    a.) LHC/PCI 09/21/2006: 100pRCA (3.0 x 23 Vision with overlapping 2.5 x 8 mm Mini-Vision d/t sm edge dissection), 10% LM, 10% pLCx, 20% mLAD, 90%D1; b.) LHC 06/17/2008: 20% pRCA, 20% p-mRCA, 30% LM, 305 LCx, 30% pLAD, 30% mLAD, 70% D1 - med mgmt; c.) MV 12/24/2009: no ischemia; d.) LHC 07/07/2018: 30% p-mRCA, 80% mLM --> CVTS; e.) s/p 2v CABG (LIMA-LAD, SVG-OM1) 07/06/2018   Chronic hepatitis (HCC)    Chronic pain syndrome    a.) followed by pain management   Complication of anesthesia    a.) PONV   COPD (chronic obstructive pulmonary disease) (HCC)    DDD (degenerative disc disease), lumbar    Depression    Diastolic dysfunction    a.) TTE 10/16/20214: EF 70%, no RWMAs, G1DD, normal RVSF, triv TR   Diverticulitis    Dyspnea    History of kidney stones    History of tobacco abuse    Hyperlipidemia    Hypertension    Insomnia    a.) takes trazodone PRN   Long term current use of aspirin    Lumbar spondylosis    Lung cancer (HCC)    Lung nodules    Mediastinal lymphadenopathy    MGUS (monoclonal gammopathy  of unknown significance)    Migraine    Myocardial infarct (HCC) 2019   On long term clopidogrel therapy    Osteoporosis    PONV (postoperative nausea and vomiting)    PVD (peripheral vascular disease) (HCC)    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   a.) LIMA-LAD, SVG-OM1   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HCC)    ST elevation myocardial infarction (STEMI) of inferior wall (HCC) 09/21/2006   a.) PCI 09/21/2006: 100% pRCA --> 3.0 x 23 vision with distal edge overlapped by a 2.5 x 8 mm Mini-Vision stent  due to a small edge dissection   SVT (supraventricular tachycardia) (HCC)    Syncope and collapse    Unstable angina (HCC)    Wears partial dentures      Past Surgical History:  Procedure Laterality Date   ABDOMINAL HYSTERECTOMY     CAROTID ANGIOGRAPHY N/A 01/13/2018   Procedure: CAROTID ANGIOGRAPHY;  Surgeon: Annice Needy, MD;  Location: [facility];  Service: Cardiovascular;  Laterality: N/A;   COLONOSCOPY     CORONARY ANGIOPLASTY WITH STENT PLACEMENT Left 09/21/2006   Procedure: CORONARY ANGIOPLASTY WITH STENT PLACEMENT; Location: [facility]; Surgeon: Jerrell Mylar, MD  CORONARY ARTERY BYPASS GRAFT N/A 07/07/2018   Procedure: CORONARY ARTERY BYPASS GRAFT; Location: [facility]; Surgeon: Cherlyn Labella, MD   ENDOBRONCHIAL ULTRASOUND Bilateral 09/30/2023   Procedure: ENDOBRONCHIAL ULTRASOUND;  Surgeon: Janann Colonel, MD;  Location: [facility];  Service: Pulmonary;  Laterality: Bilateral;   ENDOVASCULAR REPAIR/STENT GRAFT Bilateral 12/14/2020   Procedure: ENDOVASCULAR REPAIR/STENT GRAFT;  Surgeon: Annice Needy, MD;  Location: [facility];  Service: Cardiovascular;  Laterality: Bilateral;   iv INJECTIONS TO BACK     LEFT HEART CATH Right 07/07/2018   Procedure: Left Heart Cath and Coronary Angiography;  Surgeon: Laurier Nancy, MD;  Location: [facility];  Service: Cardiovascular;  Laterality: Right;   LEFT HEART CATH AND CORONARY ANGIOGRAPHY Left 06/17/2008   Procedure: LEFT  HEART CATH AND CORONARY ANGIOGRAPHY; Location: [facility]; Surgeon: Cherlyn Labella, MD   LEFT HEART CATH AND CORONARY ANGIOGRAPHY Left 07/07/2018   Procedure: LEFT HEART CATH AND CORONARY ANGIOGRAPHY; Location: [facility]     Current Outpatient Medications  Medication Sig Dispense Refill   aspirin EC 81 MG tablet Take 81 mg by mouth every evening.     atorvastatin (LIPITOR) 80 MG tablet TAKE 1 TABLET(80 MG) BY MOUTH AT BEDTIME (Patient taking differently: Take 80 mg by mouth at bedtime.) 90 tablet 0   Cholecalciferol (VITAMIN D3) 1.25 MG (50000 UT) capsule TAKE 1 CAPSULE BY MOUTH 1 TIME WEEKLY (Patient taking differently: Take 1 capsule by mouth once a week. Thursdays) 12 capsule 3   clopidogrel (PLAVIX) 75 MG tablet TAKE 1 TABLET BY MOUTH EVERY DAY 90 tablet 1   DULoxetine (CYMBALTA) 30 MG capsule Take 1 capsule (30 mg total) by mouth 2 (two) times daily. 180 capsule 1   ezetimibe (ZETIA) 10 MG tablet TAKE 1 TABLET BY MOUTH EVERY DAY (Patient taking differently: Take 10 mg by mouth every morning.) 90 tablet 1   Glycopyrrolate-Formoterol (BEVESPI AEROSPHERE) 9-4.8 MCG/ACT AERO Inhale 2 puffs into the lungs 2 (two) times daily. 3 each 3   hydrOXYzine (VISTARIL) 25 MG capsule TAKE 1 CAPSULE BY MOUTH EVERY DAY AS NEEDED FOR ANXIETY AND 2 CAPSULES AT BEDTIME AS NEEDED FOR SLEEP, AS DIRECTED (Patient taking differently: 25 mg as needed. TAKE 1 CAPSULE BY MOUTH EVERY DAY AS NEEDED FOR ANXIETY AND 2 CAPSULES AT BEDTIME AS NEEDED FOR SLEEP, AS DIRECTED) 90 capsule 5   metoprolol succinate (TOPROL-XL) 50 MG 24 hr tablet Take 1 tablet (50 mg total) by mouth daily. (Patient taking differently: Take 50 mg by mouth every morning.) 90 tablet 1   Multiple Vitamin (MULTIVITAMIN) tablet Take 1 tablet by mouth 2 (two) times a week. Centrum Silver for Women     nicotine polacrilex (NICORETTE) 4 MG gum Take 4 mg by mouth as needed for smoking cessation.     Probiotic Product (PROBIOTIC DAILY PO) Take 1 capsule by mouth every  other day.     traMADol HCl 100 MG TABS Take 1 tablet by mouth every 12 (twelve) hours as needed. 60 tablet 5   traZODone (DESYREL) 100 MG tablet Take 1 tablet (100 mg total) by mouth at bedtime. (Patient taking differently: Take 50-100 mg by mouth at bedtime.) 90 tablet 1   nitroGLYCERIN (NITROSTAT) 0.4 MG SL tablet Place 0.4 mg under the tongue every 5 (five) minutes x 3 doses as needed for chest pain.  (Patient not taking: Reported on 10/04/2023)     No current facility-administered medications for this visit.    Allergies:   Effexor xr [venlafaxine hcl], Penicillins,  Amoxicillin-pot clavulanate, and Keflex [cephalexin]    Social History:   reports that she quit smoking about 5 years ago. Her smoking use included cigarettes. She started smoking about 48 years ago. She has a 43.8 pack-year smoking history. She has never used smokeless tobacco. She reports current alcohol use. She reports that she does not use drugs.   Family History:  family history includes Breast cancer in her maternal aunt; Breast cancer (age of onset: 8) in her mother; Depression in her sister.    ROS:     Review of Systems  Constitutional: Negative.   HENT: Negative.    Eyes: Negative.   Respiratory: Negative.    Gastrointestinal: Negative.   Genitourinary: Negative.   Musculoskeletal: Negative.   Skin: Negative.   Neurological: Negative.   Endo/Heme/Allergies: Negative.   Psychiatric/Behavioral: Negative.    All other systems reviewed and are negative.     All other systems are reviewed and negative.    PHYSICAL EXAM: VS:  BP 118/84   Pulse (!) 101   Ht 5\' 2"  (1.575 m)   Wt 118 lb 3.2 oz (53.6 kg)   SpO2 94%   BMI 21.62 kg/m  , BMI Body mass index is 21.62 kg/m. Last weight:  Wt Readings from Last 3 Encounters:  10/04/23 118 lb 3.2 oz (53.6 kg)  09/24/23 118 lb 9.6 oz (53.8 kg)  08/27/23 119 lb 6.4 oz (54.2 kg)     Physical Exam Constitutional:      Appearance: Normal appearance.   Cardiovascular:     Rate and Rhythm: Normal rate and regular rhythm.     Heart sounds: Normal heart sounds.  Pulmonary:     Effort: Pulmonary effort is normal.     Breath sounds: Normal breath sounds.  Musculoskeletal:     Right lower leg: No edema.     Left lower leg: No edema.  Neurological:     Mental Status: She is alert.       EKG: NSR LVH T-wave inversion deeply inferior and anterolaterally  in [facility]  Recent Labs: 01/04/2023: ALT 17 09/27/2023: BUN 17; Creatinine, Ser 0.77; Hemoglobin 14.1; Platelets 278; Potassium 3.9; Sodium 136    Lipid Panel    Component Value Date/Time   CHOL 146 01/04/2023 1426   TRIG 82 01/04/2023 1426   HDL 61 01/04/2023 1426   LDLCALC 69 01/04/2023 1426      Other studies Reviewed: Additional studies/ records that were reviewed today include:  Review of the above records demonstrates:       No data to display            ASSESSMENT AND PLAN:    ICD-10-CM   1. Coronary artery disease involving native coronary artery of native heart without angina pectoris  I25.10 MYOCARDIAL PERFUSION IMAGING   Ischaemic changes EKG advise persantine myoview. LVEF on echo normal LVEF    2. S/P right coronary artery (RCA) stent placement  Z95.5 MYOCARDIAL PERFUSION IMAGING    3. Mixed hyperlipidemia  E78.2 MYOCARDIAL PERFUSION IMAGING    4. Benign essential hypertension  I10 MYOCARDIAL PERFUSION IMAGING    5. Peripheral vascular disease (HCC)  I73.9 MYOCARDIAL PERFUSION IMAGING       Problem List Items Addressed This Visit       Cardiovascular and Mediastinum   Peripheral vascular disease (HCC)   Relevant Orders   MYOCARDIAL PERFUSION IMAGING   CAD (coronary artery disease) - Primary   Relevant Orders   MYOCARDIAL PERFUSION IMAGING   Benign  essential hypertension   Relevant Orders   MYOCARDIAL PERFUSION IMAGING     Other   Mixed hyperlipidemia   Relevant Orders   MYOCARDIAL PERFUSION IMAGING   Other Visit Diagnoses     S/P  right coronary artery (RCA) stent placement       Relevant Orders   MYOCARDIAL PERFUSION IMAGING          Disposition:   Return in about 2 weeks (around 10/18/2023) for stress test and f/u.    Total time spent: 30 minutes  Signed,  Adrian Blackwater, MD  10/04/2023 3:07 PM    [facility]

## 2023-10-06 DIAGNOSIS — C3431 Malignant neoplasm of lower lobe, right bronchus or lung: Secondary | ICD-10-CM | POA: Insufficient documentation

## 2023-10-06 NOTE — Assessment & Plan Note (Signed)
#   IMPRESSION: 1. Lung-RADS 4X, highly suspicious. Additional imaging evaluation or consultation with Pulmonology or Thoracic Surgery recommended. Development of a right lower lobe pleural-based dominant nodule, multiple areas of right-sided pleural thickening, and right infrahilar soft tissue fullness (likely adenopathy). 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 2. Incidental findings, including: Cholelithiasis. Aortic atherosclerosis (ICD10-I70.0) and emphysema (ICD10-J43.9). Left nephrolithiasis.  NTERPRETATION(S):      - POSITIVE FOR MALIGNANCY.       - METASTATIC NON-SMALL CELL CARCINOMA IS PRESENT, FAVOR ADENOCARCINOMA.       - BACKGROUND LYMPHOID MATERIAL PRESENT CONSISTENT WITH LYMPH NODE SAMPLING.       - ATYPICAL.       - RARE ATYPICAL CELLS ARE PRESENT.       - BACKGROUND LYMPHOID MATERIAL PRESENT CONSISTENT WITH LYMPH NODE SAMPLING.       - ATYPICAL.       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  Not quantified; however 3 subsequent myeloma work-up [2020; 2021; more recently 2022]-negative for any M protein/even on immunofixation.  Kappa (chain ratio normal.  CBC CMP normal.  Skeletal survey-2021-negative in the bone lesions.  Recommend follow-up with PCP regarding age-appropriate cancer screening.  # Smoker/lung cancer screening- quit 2 years ago-continue lung cancer screening per protocol..  # AAA- [noted on skeletal survey in 2021]-s/p repair January 2022  # DISPOSITION: # follow up as needed-Dr.B   Cc; Dr.Neelam Khan/[place].

## 2023-10-07 ENCOUNTER — Inpatient Hospital Stay: Payer: Medicare HMO | Attending: Internal Medicine | Admitting: Internal Medicine

## 2023-10-07 ENCOUNTER — Encounter: Payer: Self-pay | Admitting: Internal Medicine

## 2023-10-07 ENCOUNTER — Inpatient Hospital Stay: Payer: Medicare HMO

## 2023-10-07 VITALS — BP 130/95 | HR 93 | Temp 98.7°F | Ht 62.0 in | Wt 118.0 lb

## 2023-10-07 DIAGNOSIS — N200 Calculus of kidney: Secondary | ICD-10-CM | POA: Diagnosis not present

## 2023-10-07 DIAGNOSIS — Z880 Allergy status to penicillin: Secondary | ICD-10-CM | POA: Diagnosis not present

## 2023-10-07 DIAGNOSIS — Z818 Family history of other mental and behavioral disorders: Secondary | ICD-10-CM | POA: Insufficient documentation

## 2023-10-07 DIAGNOSIS — E785 Hyperlipidemia, unspecified: Secondary | ICD-10-CM | POA: Diagnosis not present

## 2023-10-07 DIAGNOSIS — C782 Secondary malignant neoplasm of pleura: Secondary | ICD-10-CM | POA: Insufficient documentation

## 2023-10-07 DIAGNOSIS — G4700 Insomnia, unspecified: Secondary | ICD-10-CM | POA: Diagnosis not present

## 2023-10-07 DIAGNOSIS — Z87442 Personal history of urinary calculi: Secondary | ICD-10-CM | POA: Diagnosis not present

## 2023-10-07 DIAGNOSIS — R531 Weakness: Secondary | ICD-10-CM | POA: Insufficient documentation

## 2023-10-07 DIAGNOSIS — I252 Old myocardial infarction: Secondary | ICD-10-CM | POA: Insufficient documentation

## 2023-10-07 DIAGNOSIS — I739 Peripheral vascular disease, unspecified: Secondary | ICD-10-CM | POA: Insufficient documentation

## 2023-10-07 DIAGNOSIS — R0602 Shortness of breath: Secondary | ICD-10-CM | POA: Diagnosis not present

## 2023-10-07 DIAGNOSIS — R5383 Other fatigue: Secondary | ICD-10-CM | POA: Insufficient documentation

## 2023-10-07 DIAGNOSIS — R590 Localized enlarged lymph nodes: Secondary | ICD-10-CM | POA: Insufficient documentation

## 2023-10-07 DIAGNOSIS — Z8379 Family history of other diseases of the digestive system: Secondary | ICD-10-CM | POA: Insufficient documentation

## 2023-10-07 DIAGNOSIS — Z881 Allergy status to other antibiotic agents status: Secondary | ICD-10-CM | POA: Insufficient documentation

## 2023-10-07 DIAGNOSIS — Z87891 Personal history of nicotine dependence: Secondary | ICD-10-CM | POA: Diagnosis not present

## 2023-10-07 DIAGNOSIS — M549 Dorsalgia, unspecified: Secondary | ICD-10-CM | POA: Insufficient documentation

## 2023-10-07 DIAGNOSIS — C3431 Malignant neoplasm of lower lobe, right bronchus or lung: Secondary | ICD-10-CM | POA: Insufficient documentation

## 2023-10-07 DIAGNOSIS — Z90710 Acquired absence of both cervix and uterus: Secondary | ICD-10-CM | POA: Insufficient documentation

## 2023-10-07 DIAGNOSIS — Z800 Family history of malignant neoplasm of digestive organs: Secondary | ICD-10-CM | POA: Insufficient documentation

## 2023-10-07 DIAGNOSIS — I700 Atherosclerosis of aorta: Secondary | ICD-10-CM | POA: Insufficient documentation

## 2023-10-07 DIAGNOSIS — G894 Chronic pain syndrome: Secondary | ICD-10-CM | POA: Insufficient documentation

## 2023-10-07 DIAGNOSIS — J439 Emphysema, unspecified: Secondary | ICD-10-CM | POA: Insufficient documentation

## 2023-10-07 DIAGNOSIS — I10 Essential (primary) hypertension: Secondary | ICD-10-CM | POA: Insufficient documentation

## 2023-10-07 DIAGNOSIS — Z803 Family history of malignant neoplasm of breast: Secondary | ICD-10-CM | POA: Insufficient documentation

## 2023-10-07 DIAGNOSIS — K8020 Calculus of gallbladder without cholecystitis without obstruction: Secondary | ICD-10-CM | POA: Diagnosis not present

## 2023-10-07 DIAGNOSIS — I2510 Atherosclerotic heart disease of native coronary artery without angina pectoris: Secondary | ICD-10-CM | POA: Diagnosis not present

## 2023-10-07 DIAGNOSIS — Z7902 Long term (current) use of antithrombotics/antiplatelets: Secondary | ICD-10-CM | POA: Diagnosis not present

## 2023-10-07 DIAGNOSIS — Z79899 Other long term (current) drug therapy: Secondary | ICD-10-CM | POA: Diagnosis not present

## 2023-10-07 NOTE — Telephone Encounter (Signed)
Noted.  Will close encounter.  

## 2023-10-07 NOTE — Progress Notes (Unsigned)
[facility] CONSULT NOTE  Patient Care Team: Margaretann Loveless, MD as PCP - General (Internal Medicine) Glory Buff, RN as Oncology Nurse Navigator  CHIEF COMPLAINTS/PURPOSE OF CONSULTATION: Lung cancer  Oncology History Overview Note  IMPRESSION: 1. Lung-RADS 4X, highly suspicious. Additional imaging evaluation or consultation with Pulmonology or Thoracic Surgery recommended. Development of a right lower lobe pleural-based dominant nodule, multiple areas of right-sided pleural thickening, and right infrahilar soft tissue fullness (likely adenopathy). 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 2. Incidental findings, including: Cholelithiasis. Aortic atherosclerosis (ICD10-I70.0) and emphysema (ICD10-J43.9). Left nephrolithiasis.   Cancer of lower lobe of right lung (HCC)  10/06/2023 Initial Diagnosis   Cancer of lower lobe of right lung (HCC)   10/07/2023 Cancer Staging   Staging form: Lung, AJCC 8th Edition - Clinical: Stage IIIB (cT4, cN2, cM0) - Signed by Earna Coder, MD on 10/10/2023     HISTORY OF PRESENTING ILLNESS: Patient ambulating-independently. Accompanied by family- nephew   Melissa Montes 67 y.o.  female pleasant patient with a longstanding history of smoking currently quit with severe COPD CAD is here today with results of the pathology/biopsy.  Patient had a CT scan as part of lung cancer screening that was abnormal with multiple right pleural-based nodules and also right hilar adenopathy.  Patient subsequently underwent a PET scan as a biopsy.  Patient has severe shortness of breath especially with exertion.  Her COPD is also limiting her quality of life and ADLs.  Patient has poor social support.  Review of Systems  Constitutional:  Positive for malaise/fatigue and weight loss. Negative for chills, diaphoresis and fever.  HENT:  Negative for nosebleeds and sore throat.    Eyes:  Negative for double vision.  Respiratory:  Positive for shortness of breath. Negative for cough, hemoptysis, sputum production and wheezing.   Cardiovascular:  Negative for chest pain, palpitations, orthopnea and leg swelling.  Gastrointestinal:  Negative for abdominal pain, blood in stool, constipation, diarrhea, heartburn, melena, nausea and vomiting.  Genitourinary:  Negative for dysuria, frequency and urgency.  Musculoskeletal:  Positive for back pain. Negative for joint pain.  Skin: Negative.  Negative for itching and rash.  Neurological:  Positive for weakness. Negative for dizziness, tingling, focal weakness and headaches.  Endo/Heme/Allergies:  Does not bruise/bleed easily.  Psychiatric/Behavioral:  Negative for depression. The patient is not nervous/anxious and does not have insomnia.     MEDICAL HISTORY:  Past Medical History:  Diagnosis Date   AAA (abdominal aortic aneurysm) (HCC)    a.) s/p EVAR 12/14/2020   ADD (attention deficit disorder)    a.) followed by psychiatry   Anxiety    Atherosclerosis of abdominal aorta (HCC)    Bilateral carotid artery stenosis    Bipolar disorder (HCC)    a.) followed by psychiatry   CAD (coronary artery disease)    a.) LHC/PCI 09/21/2006: 100pRCA (3.0 x 23 Vision with overlapping 2.5 x 8 mm Mini-Vision d/t sm edge dissection), 10% LM, 10% pLCx, 20% mLAD, 90%D1; b.) LHC 06/17/2008: 20% pRCA, 20% p-mRCA, 30% LM, 305 LCx, 30% pLAD, 30% mLAD, 70% D1 - med mgmt; c.) MV 12/24/2009: no ischemia; d.) LHC 07/07/2018: 30% p-mRCA, 80% mLM --> CVTS; e.) s/p 2v CABG (LIMA-LAD, SVG-OM1) 07/06/2018   Chronic hepatitis (HCC)    Chronic pain syndrome    a.) followed by pain management   Complication of anesthesia    a.) PONV   COPD (chronic obstructive pulmonary disease) (HCC)  DDD (degenerative disc disease), lumbar    Depression    Diastolic dysfunction    a.) TTE 10/16/20214: EF 70%, no RWMAs, G1DD, normal RVSF, triv TR   Diverticulitis     Dyspnea    History of kidney stones    History of tobacco abuse    Hyperlipidemia    Hypertension    Insomnia    a.) takes trazodone PRN   Long term current use of aspirin    Lumbar spondylosis    Lung cancer (HCC)    Lung nodules    Mediastinal lymphadenopathy    MGUS (monoclonal gammopathy of unknown significance)    Migraine    Myocardial infarct (HCC) 2019   On long term clopidogrel therapy    Osteoporosis    PONV (postoperative nausea and vomiting)    PVD (peripheral vascular disease) (HCC)    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   a.) LIMA-LAD, SVG-OM1   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HCC)    ST elevation myocardial infarction (STEMI) of inferior wall (HCC) 09/21/2006   a.) PCI 09/21/2006: 100% pRCA --> 3.0 x 23 vision with distal edge overlapped by a 2.5 x 8 mm Mini-Vision stent  due to a small edge dissection   SVT (supraventricular tachycardia) (HCC)    Syncope and collapse    Unstable angina (HCC)    Wears partial dentures     SURGICAL HISTORY: Past Surgical History:  Procedure Laterality Date   ABDOMINAL HYSTERECTOMY     CAROTID ANGIOGRAPHY N/A 01/13/2018   Procedure: CAROTID ANGIOGRAPHY;  Surgeon: Annice Needy, MD;  Location: [facility];  Service: Cardiovascular;  Laterality: N/A;   COLONOSCOPY     CORONARY ANGIOPLASTY WITH STENT PLACEMENT Left 09/21/2006   Procedure: CORONARY ANGIOPLASTY WITH STENT PLACEMENT; Location: [facility]; Surgeon: Jerrell Mylar, MD   CORONARY ARTERY BYPASS GRAFT N/A 07/07/2018   Procedure: CORONARY ARTERY BYPASS GRAFT; Location: [facility]; Surgeon: Cherlyn Labella, MD   ENDOBRONCHIAL ULTRASOUND Bilateral 09/30/2023   Procedure: ENDOBRONCHIAL ULTRASOUND;  Surgeon: Janann Colonel, MD;  Location: [facility];  Service: Pulmonary;  Laterality: Bilateral;   ENDOVASCULAR REPAIR/STENT GRAFT Bilateral 12/14/2020   Procedure: ENDOVASCULAR REPAIR/STENT GRAFT;  Surgeon: Annice Needy, MD;  Location: [facility];  Service: Cardiovascular;   Laterality: Bilateral;   iv INJECTIONS TO BACK     LEFT HEART CATH Right 07/07/2018   Procedure: Left Heart Cath and Coronary Angiography;  Surgeon: Laurier Nancy, MD;  Location: [facility];  Service: Cardiovascular;  Laterality: Right;   LEFT HEART CATH AND CORONARY ANGIOGRAPHY Left 06/17/2008   Procedure: LEFT HEART CATH AND CORONARY ANGIOGRAPHY; Location: [facility]; Surgeon: Cherlyn Labella, MD   LEFT HEART CATH AND CORONARY ANGIOGRAPHY Left 07/07/2018   Procedure: LEFT HEART CATH AND CORONARY ANGIOGRAPHY; Location: [facility]    SOCIAL HISTORY: Social History   Socioeconomic History   Marital status: Divorced    Spouse name: Not on file   Number of children: Not on file   Years of education: Not on file   Highest education level: Not on file  Occupational History   Occupation: disabled  Tobacco Use   Smoking status: Former    Current packs/day: 0.00    Average packs/day: 1 pack/day for 43.8 years (43.8 ttl pk-yrs)    Types: Cigarettes    Start date: 58    Quit date: 09/19/2018    Years since quitting: 5.0   Smokeless tobacco: Never   Tobacco comments:    uses nicotine gums  Vaping Use   Vaping status: Former  Substance and Sexual Activity   Alcohol use: Yes    Comment: rare   Drug use: No   Sexual activity: Not on file  Other Topics Concern   Not on file  Social History Narrative   Not on file   Social Determinants of Health   Financial Resource Strain: Not on file  Food Insecurity: Not on file  Transportation Needs: Not on file  Physical Activity: Not on file  Stress: Not on file  Social Connections: Not on file  Intimate Partner Violence: Not on file    FAMILY HISTORY: Family History  Problem Relation Age of Onset   Breast cancer Mother 79   Pancreatic cancer Mother    Cirrhosis Father    Depression Sister    Breast cancer Maternal Aunt        mat aunt and great aunt    ALLERGIES:  is allergic to effexor xr [venlafaxine hcl], penicillins,  amoxicillin-pot clavulanate, and keflex [cephalexin].  MEDICATIONS:  Current Outpatient Medications  Medication Sig Dispense Refill   aspirin EC 81 MG tablet Take 81 mg by mouth every evening.     atorvastatin (LIPITOR) 80 MG tablet TAKE 1 TABLET(80 MG) BY MOUTH AT BEDTIME (Patient taking differently: Take 80 mg by mouth at bedtime.) 90 tablet 0   Cholecalciferol (VITAMIN D3) 1.25 MG (50000 UT) capsule TAKE 1 CAPSULE BY MOUTH 1 TIME WEEKLY (Patient taking differently: Take 1 capsule by mouth once a week. Thursdays) 12 capsule 3   clopidogrel (PLAVIX) 75 MG tablet TAKE 1 TABLET BY MOUTH EVERY DAY 90 tablet 1   DULoxetine (CYMBALTA) 30 MG capsule Take 1 capsule (30 mg total) by mouth 2 (two) times daily. 180 capsule 1   ezetimibe (ZETIA) 10 MG tablet TAKE 1 TABLET BY MOUTH EVERY DAY (Patient taking differently: Take 10 mg by mouth every morning.) 90 tablet 1   hydrOXYzine (VISTARIL) 25 MG capsule TAKE 1 CAPSULE BY MOUTH EVERY DAY AS NEEDED FOR ANXIETY AND 2 CAPSULES AT BEDTIME AS NEEDED FOR SLEEP, AS DIRECTED (Patient taking differently: 25 mg as needed. TAKE 1 CAPSULE BY MOUTH EVERY DAY AS NEEDED FOR ANXIETY AND 2 CAPSULES AT BEDTIME AS NEEDED FOR SLEEP, AS DIRECTED) 90 capsule 5   metoprolol succinate (TOPROL-XL) 50 MG 24 hr tablet Take 1 tablet (50 mg total) by mouth daily. (Patient taking differently: Take 50 mg by mouth every morning.) 90 tablet 1   Multiple Vitamin (MULTIVITAMIN) tablet Take 1 tablet by mouth 2 (two) times a week. Centrum Silver for Women     nicotine polacrilex (NICORETTE) 4 MG gum Take 4 mg by mouth as needed for smoking cessation.     Probiotic Product (PROBIOTIC DAILY PO) Take 1 capsule by mouth every other day.     traMADol HCl 100 MG TABS Take 1 tablet by mouth every 12 (twelve) hours as needed. 60 tablet 5   traZODone (DESYREL) 100 MG tablet Take 1 tablet (100 mg total) by mouth at bedtime. (Patient taking differently: Take 50-100 mg by mouth at bedtime.) 90 tablet 1    Glycopyrrolate-Formoterol (BEVESPI AEROSPHERE) 9-4.8 MCG/ACT AERO Inhale 2 puffs into the lungs 2 (two) times daily. (Patient not taking: Reported on 10/07/2023) 3 each 3   nitroGLYCERIN (NITROSTAT) 0.4 MG SL tablet Place 0.4 mg under the tongue every 5 (five) minutes x 3 doses as needed for chest pain.  (Patient not taking: Reported on 10/07/2023)     No current facility-administered medications  for this visit.    PHYSICAL EXAMINATION:   Vitals:   10/07/23 1444  BP: (!) 130/95  Pulse: 93  Temp: 98.7 F (37.1 C)  SpO2: 97%   Filed Weights   10/07/23 1444  Weight: 118 lb (53.5 kg)    Physical Exam Vitals and nursing note reviewed.  HENT:     Head: Normocephalic and atraumatic.     Mouth/Throat:     Pharynx: Oropharynx is clear.  Eyes:     Extraocular Movements: Extraocular movements intact.     Pupils: Pupils are equal, round, and reactive to light.  Cardiovascular:     Rate and Rhythm: Normal rate and regular rhythm.  Pulmonary:     Comments: Decreased breath sounds bilaterally.  Abdominal:     Palpations: Abdomen is soft.  Musculoskeletal:        General: Normal range of motion.     Cervical back: Normal range of motion.  Skin:    General: Skin is warm.  Neurological:     General: No focal deficit present.     Mental Status: She is alert and oriented to person, place, and time.  Psychiatric:        Behavior: Behavior normal.        Judgment: Judgment normal.     LABORATORY DATA:  I have reviewed the data as listed Lab Results  Component Value Date   WBC 10.6 (H) 09/27/2023   HGB 14.1 09/27/2023   HCT 41.1 09/27/2023   MCV 88.2 09/27/2023   PLT 278 09/27/2023   Recent Labs    01/04/23 1426 09/27/23 1418  NA 140 136  K 4.6 3.9  CL 103 102  CO2 23 26  GLUCOSE 99 107*  BUN 17 17  CREATININE 0.76 0.77  CALCIUM 9.3 9.3  GFRNONAA  --  >60  PROT 6.4  --   ALBUMIN 4.3  --   AST 20  --   ALT 17  --   ALKPHOS 135*  --   BILITOT 0.4  --      RADIOGRAPHIC STUDIES: I have personally reviewed the radiological images as listed and agreed with the findings in the report. NM PET Image Initial (PI) Skull Base To Thigh (F-18 FDG)  Result Date: 10/03/2023 CLINICAL DATA:  Initial treatment strategy for pulmonary nodule. EXAM: NUCLEAR MEDICINE PET SKULL BASE TO THIGH TECHNIQUE: 6.8 mCi F-18 FDG was injected intravenously. Full-ring PET imaging was performed from the skull base to thigh after the radiotracer. CT data was obtained and used for attenuation correction and anatomic localization. Fasting blood glucose: 92 mg/dl COMPARISON:  CT chest dated 08/27/2023 FINDINGS: Mediastinal blood pool activity: SUV max 2.7 Liver activity: SUV max NA NECK: No hypermetabolic lymph nodes in the neck. Incidental CT findings: None. CHEST: 8 mm short axis subcarinal node (series 6/image 50), max SUV 12.1. 12 mm short axis right perihilar node (series 6/image 54), max SUV 15.2. Multifocal pleural-based nodularity in the right hemithorax, including a dominant 6 x 20 mm nodule overlying the right posterolateral 7th rib (series 6/image 4), max SUV 13.0. Incidental CT findings: Atherosclerotic calcifications of the aortic arch. Moderate coronary atherosclerosis of the LAD and right coronary artery. Postsurgical changes related to prior CABG. ABDOMEN/PELVIS: No abnormal hypermetabolic activity within the liver, pancreas, adrenal glands, or spleen. No hypermetabolic lymph nodes in the abdomen or pelvis. Incidental CT findings: Layering small gallstones, without associated inflammatory changes. 2 mm nonobstructing left upper pole renal calculus. Aorta bi-iliac stent. Atherosclerotic calcifications of the  aortic arch. Status post hysterectomy. 5.3 cm right adnexal cyst, non FDG avid, benign. SKELETON: No focal hypermetabolic activity to suggest skeletal metastasis. Incidental CT findings: Median sternotomy. Mild degenerative changes of the visualized thoracolumbar spine.  IMPRESSION: Multifocal pleural metastases/malignancy in the right hemithorax, as above. Associated small subcarinal and right perihilar nodal metastases. Extra thoracic primary malignancy not identified on PET. Electronically Signed   By: Charline Bills M.D.   On: 10/03/2023 01:47   DG Chest Port 1 View  Result Date: 09/30/2023 CLINICAL DATA:  Status post bronchoscopy EXAM: PORTABLE CHEST 1 VIEW COMPARISON:  Chest x-ray 07/17/2020 FINDINGS: Patient is status post cardiac surgery. The cardiomediastinal silhouette is within normal limits. There are new strandy and patchy airspace opacities in both lower lungs, right greater than left. Costophrenic angles are clear. No pneumothorax visualized. Emphysematous changes are again seen. Faint nodular density overlies the right mid lung measuring 1 cm. This may represent pleural nodularity as seen on recent CT, but is indeterminate. No acute fractures are seen. IMPRESSION: 1. No pneumothorax visualized. 2. New strandy and patchy airspace opacities in both lower lungs, right greater than left, which may represent atelectasis or infection. 3. Faint nodular density overlies the right mid lung measuring 1 cm. This may represent pleural nodularity as seen on recent CT, but is indeterminate. Follow-up chest CT should be considered. Electronically Signed   By: Darliss Cheney M.D.   On: 09/30/2023 15:26     Cancer of lower lobe of right lung (HCC) #  Lung-RADS 4X, highly suspicious.Development of a right lower lobe pleural-based dominant nodule, multiple areas of right-sided pleural thickening, and right infrahilar soft tissue fullness (likely adenopathy). 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 OCT 2024- Multifocal pleural metastases/malignancy in the right hemithorax. Associated small subcarinal and right perihilar nodal metastases. Stage IIIB-   # EBUS [Dr.Assaker; pul]:  POSITIVE FOR MALIGNANCY; METASTATIC  NON-SMALL CELL CARCINOMA IS PRESENT, FAVOR ADENOCARCINOMA. NGS- QNS.  Discussed re: Liquid Bx . And also discussed re: Repeat Bronc for NGS. Patient currently declines. Discussed with Dr.Assaker. also reviewed at tumor conference.   # I reviewed the pathology with the stage with the patient in detail.  Discussed given advanced stage patient is very unlikely to be cured of the malignancy.  Discussed the treatment options including chemoradiation versus chemoimmunotherapy.  At this time patient declines.   # COPD- severe- limiting patients ADLS.   # cardiac: Prior history of CAD; as per patient most recent workup with PCP/cardiologist,  Dr. Welton Flakes showed "significant EKG changes".  However patient is not inclined to proceed with any interventions at this time given her lung diagnosis.   # Patient lives alone and unfortunately has limited social support/transportation issues.  She states her COPD is quite severe that is affecting her ADLs.  She is not too keen on therapy at this time.    # Given patient's inclination against any therapy I had a long discussion with the patient and family [nephew] given the incurable nature of the disease /poor tolerance of therapy.  I also discussed given general less than 6 months of life expectancy, I introduced hospice philosophy to the patient and family.    I discussed that goal of care should be directed to symptom management rather than treating the underlying disease; and in the process help improve quality of life rather than quantity.  Discussed with hospice team would include-nurse, nurse aide, social worker and chaplain for help take care of  patient with physical/emotional needs.  I also reviewed in cause of emergency/acute cardiorespiratory failure patient could potentially undergo interventions including but not limited to chest compressions, intubation, defibrillation and antiarrhythmic to the right.  However given patient's frail status and incurable  malignancy I would  recommend against any aggressive interventions.  I would recommend no code.  However no decisions patient's made at this time. Recommend await evaluation with hospice.  # Patient states that she will need some time to go over her options and let us know.  Discussed with [facility].  # DISPOSITION: # follow up TBD- Dr.B  # 60 minutes face-to-face with the patient discussing the above plan of care; more than 50% of time spent on prognosis/ natural history; counseling and coordination.   Cc; Dr.Neelam Khan/[place].   Above plan of care was discussed with patient/family in detail.  My contact information was given to the patient/family.     Earna Coder, MD 10/10/2023 1:33 PM

## 2023-10-07 NOTE — Progress Notes (Unsigned)
Nephew Loraine Leriche is with her today.  Appetite is 25% normal, carnation protein drinks occ.  C/o fatigue and sob.  Struggles to get rides to appt due to living alone.  Worried/tearful if tx will add more than 6 months of life.

## 2023-10-10 ENCOUNTER — Other Ambulatory Visit: Payer: Medicare HMO

## 2023-10-10 ENCOUNTER — Encounter: Payer: Self-pay | Admitting: *Deleted

## 2023-10-10 NOTE — Progress Notes (Signed)
Phone call made to patient to discuss obtaining blood sample for liquid biopsy. Per Dr. Donneta Romberg, tissue sample not sufficient for molecular testing. Pt made aware and informed of option of collecting blood sample for molecular testing instead. Informed that chance of positive mutations/markers being found was low due to her smoking history but still advised to check if pt agreeable. Pt stated that she needs time to think about her options. Contact info given and instructed pt to call back to let me know in order to schedule her for lab visit. Encouraged pt to call back if has any questions regarding her visit with Dr. B this week as well. Pt verbalized understanding.

## 2023-10-15 ENCOUNTER — Ambulatory Visit: Payer: Medicare HMO | Admitting: Cardiovascular Disease

## 2023-10-25 ENCOUNTER — Encounter: Payer: Self-pay | Admitting: *Deleted

## 2023-10-25 NOTE — Progress Notes (Signed)
Received call back from patient and she states that she is still undecided about treatment but is interested in pursuing [facility] liquid biopsy. Pt scheduled for lab draw on 12/12 and will inform pt of results once available. All questions answered during call. Instructed pt to call back if has any further questions or needs. Pt verbalized understanding.

## 2023-10-25 NOTE — Progress Notes (Signed)
Attempted to contact pt to see if she has made a decision regarding treatment or pursuing comfort/quality of life. Pt did not answer. Message left for pt to return call to ensure appropriate appts/referrals are made based on her wishes.

## 2023-10-31 ENCOUNTER — Encounter: Payer: Self-pay | Admitting: *Deleted

## 2023-10-31 ENCOUNTER — Encounter: Payer: Self-pay | Admitting: Psychiatry

## 2023-10-31 ENCOUNTER — Other Ambulatory Visit: Payer: Medicare HMO | Attending: Internal Medicine

## 2023-10-31 ENCOUNTER — Ambulatory Visit: Payer: Medicare HMO | Admitting: Psychiatry

## 2023-10-31 VITALS — BP 114/74 | HR 83 | Temp 97.3°F | Ht 62.0 in | Wt 118.0 lb

## 2023-10-31 DIAGNOSIS — F3132 Bipolar disorder, current episode depressed, moderate: Secondary | ICD-10-CM | POA: Diagnosis not present

## 2023-10-31 DIAGNOSIS — F411 Generalized anxiety disorder: Secondary | ICD-10-CM | POA: Diagnosis not present

## 2023-10-31 DIAGNOSIS — G4701 Insomnia due to medical condition: Secondary | ICD-10-CM | POA: Diagnosis not present

## 2023-10-31 MED ORDER — CLONAZEPAM 0.5 MG PO TABS: 0.2500 mg | ORAL_TABLET | Freq: Every day | ORAL | 0 refills | Status: DC | PRN

## 2023-10-31 NOTE — Patient Instructions (Signed)
Clonazepam Tablets What is this medication? CLONAZEPAM (kloe NA ze pam) treats seizures. It may also be used to treat panic disorder. It works by Education administrator system calm down. It belongs to a group of medications called benzodiazepines. This medicine may be used for other purposes; ask your health care provider or pharmacist if you have questions. COMMON BRAND NAME(S): Ceberclon, Klonopin What should I tell my care team before I take this medication? They need to know if you have any of these conditions: Bipolar disorder, depression, psychosis, or other mental health conditions Glaucoma Kidney disease Liver disease Lung or breathing disease Myasthenia gravis Parkinson disease Porphyria Seizures or a history of seizures Substance use disorder Suicidal thoughts, plans, or attempt by you or a family member An unusual or allergic reaction to clonazepam, other medications, foods, dyes, or preservatives Pregnant or trying to get pregnant Breastfeeding How should I use this medication? Take this medication by mouth with water. Take it as directed on the prescription label at the same time every day. You can take it with or without food. If it upsets your stomach, take it with food. Do not take it more often than directed. Do not stop taking or change the dose except on the advice of your care team. A special MedGuide will be given to you by the pharmacist with each prescription and refill. Be sure to read this information carefully each time. Talk to your care team about the use of this medication in children. Special care may be needed. Overdosage: If you think you have taken too much of this medicine contact a poison control center or emergency room at once. NOTE: This medicine is only for you. Do not share this medicine with others. What if I miss a dose? If you miss a dose, take it as soon as you can. If it is almost time for your next dose, take only that dose. Do not take double  or extra doses. What may interact with this medication? Do not take this medication with any of the following: Opioid medications for cough Sodium oxybate This medication may also interact with the following: Alcohol Antihistamines for allergy, cough, and cold Antiviral medications for HIV or AIDS Certain medications for anxiety or sleep Certain medications for depression, such as amitriptyline, fluoxetine, sertraline Certain medications for fungal infections, such as ketoconazole and itraconazole Certain medications for seizures, such as carbamazepine, phenobarbital, phenytoin, primidone General anesthetics, such as halothane, isoflurane, methoxyflurane, propofol Local anesthetics, such as lidocaine, pramoxine, tetracaine Medications that relax muscles for surgery Opioid medications for pain Phenothiazines, such as chlorpromazine, mesoridazine, prochlorperazine, thioridazine This list may not describe all possible interactions. Give your health care provider a list of all the medicines, herbs, non-prescription drugs, or dietary supplements you use. Also tell them if you smoke, drink alcohol, or use illegal drugs. Some items may interact with your medicine. What should I watch for while using this medication? Visit your care team for regular checks on your progress. Tell your care team if your symptoms do not start to get better or if they get worse. Do not suddenly stop taking this medication. You may develop a severe reaction. Your care team will tell you how much medication to take. If your care team wants you to stop the medication, the dose may be slowly lowered over time to avoid any side effects. This medication may affect your coordination, reaction time, or judgment. Do not drive or operate machinery until you know how this medication affects you.  Sit up or stand slowly to reduce the risk of dizzy or fainting spells. Drinking alcohol with this medication can increase the risk of these  side effects. If you are taking another medication that also causes drowsiness, you may have more side effects. Give your care team a list of all medications you use. Your care team will tell you how much medication to take. Do not take more medication than directed. Get emergency help right away if you have problems breathing or unusual sleepiness. If you or your family notice any changes in your behavior, such as new or worsening depression, thoughts of harming yourself, anxiety, other unusual or disturbing thoughts, or memory loss, call your care team right away. Talk to your care team if you wish to become pregnant or think you might be pregnant. This medication can cause serious birth defects if taken during pregnancy. Talk to your care team before breastfeeding. Changes to your treatment plan may be needed. What side effects may I notice from receiving this medication? Side effects that you should report to your care team as soon as possible: Allergic reactions--skin rash, itching, hives, swelling of the face, lips, tongue, or throat CNS depression--slow or shallow breathing, shortness of breath, feeling faint, dizziness, confusion, trouble staying awake Thoughts of suicide or self-harm, worsening mood, feelings of depression Side effects that usually do not require medical attention (report to your care team if they continue or are bothersome): Dizziness Drowsiness Headache This list may not describe all possible side effects. Call your doctor for medical advice about side effects. You may report side effects to FDA at 1-800-FDA-1088. Where should I keep my medication? Keep out of the reach of children and pets. This medication can be abused. Keep your medication in a safe place to protect it from theft. Do not share this medication with anyone. Selling or giving away this medication is dangerous and against the law. Store at room temperature between 15 and 30 degrees C (59 and 86 degrees F).  Protect from light. Keep container tightly closed. This medication may cause accidental overdose and death if taken by other adults, children, or pets. Mix any unused medication with a substance like cat litter or coffee grounds. Then throw the medication away in a sealed container like a sealed bag or a coffee can with a lid. Do not use the medication after the expiration date. NOTE: This sheet is a summary. It may not cover all possible information. If you have questions about this medicine, talk to your doctor, pharmacist, or health care provider.  2024 Elsevier/Gold Standard (2022-08-01 00:00:00)

## 2023-10-31 NOTE — Progress Notes (Signed)
BH MD OP Progress Note  10/31/2023 5:24 PM Melissa Montes  MRN:  [id number]  Chief Complaint:  Chief Complaint  Patient presents with   Follow-up   Depression   Anxiety   Insomnia   Medication Refill   HPI: Melissa Montes is a 67 year old Caucasian female on disability, divorced, lives in [place], has a history of bipolar disorder, GAD, diverticulitis, abdominal aortic aneurysm, chronic pain was evaluated in office today.  The patient was diagnosed with an aggressive form of lung cancer. She was informed of a prognosis of six months to a year without treatment. The patient has decided against pursuing treatment, despite the potential to double her life expectancy, due to the anticipated severity of the side effects and her current quality of life.  The patient has been experiencing emotional distress due to her diagnosis and the lack of support from her social circle. She has expressed feelings of loneliness and frustration with her current situation. The patient has a friend who provides transportation but does not offer emotional support. Her family, including a brother in [place] and a sister in [place], are aware of the diagnosis but are not providing the emotional support the patient needs as per report. The patient is aware that as her condition progresses, she may require assistance with daily activities and may need to move into a care facility.  The patient is currently taking Cymbalta 30 mg twice daily for generalized anxiety disorder and hydroxyzine 25 mg as needed. She has stopped taking gabapentin, a mood stabilizer, a couple of months ago as she did not find it helpful. The patient is also on tramadol for back pain.  The patient's appetite has decreased, finding it difficult to stand and prepare meals due to becoming winded. She lives alone and her primary activities include watching television shows at home. The patient has expressed a desire to be alone and is  resistant to the idea of receiving help from hospice services or a therapist at this time.  Patient currently denies any suicidality, homicidality or perceptual disturbances.   Visit Diagnosis:    ICD-10-CM   1. GAD (generalized anxiety disorder)  F41.1 clonazePAM (KLONOPIN) 0.5 MG tablet    2. Bipolar 1 disorder, depressed, moderate (HCC)  F31.32     3. Insomnia due to medical condition  G47.01    Pain      Past Psychiatric History: I have reviewed past psychiatric history from progress note on 04/26/2020.  Past Medical History:  Past Medical History:  Diagnosis Date   AAA (abdominal aortic aneurysm) (HCC)    a.) s/p EVAR 12/14/2020   ADD (attention deficit disorder)    a.) followed by psychiatry   Anxiety    Atherosclerosis of abdominal aorta (HCC)    Bilateral carotid artery stenosis    Bipolar disorder (HCC)    a.) followed by psychiatry   CAD (coronary artery disease)    a.) LHC/PCI 09/21/2006: 100pRCA (3.0 x 23 Vision with overlapping 2.5 x 8 mm Mini-Vision d/t sm edge dissection), 10% LM, 10% pLCx, 20% mLAD, 90%D1; b.) LHC 06/17/2008: 20% pRCA, 20% p-mRCA, 30% LM, 305 LCx, 30% pLAD, 30% mLAD, 70% D1 - med mgmt; c.) MV 12/24/2009: no ischemia; d.) LHC 07/07/2018: 30% p-mRCA, 80% mLM --> CVTS; e.) s/p 2v CABG (LIMA-LAD, SVG-OM1) 07/06/2018   Chronic hepatitis (HCC)    Chronic pain syndrome    a.) followed by pain management   Complication of anesthesia    a.) PONV  COPD (chronic obstructive pulmonary disease) (HCC)    DDD (degenerative disc disease), lumbar    Depression    Diastolic dysfunction    a.) TTE 10/16/20214: EF 70%, no RWMAs, G1DD, normal RVSF, triv TR   Diverticulitis    Dyspnea    History of kidney stones    History of tobacco abuse    Hyperlipidemia    Hypertension    Insomnia    a.) takes trazodone PRN   Long term current use of aspirin    Lumbar spondylosis    Lung cancer (HCC)    Lung nodules    Mediastinal lymphadenopathy    MGUS  (monoclonal gammopathy of unknown significance)    Migraine    Myocardial infarct (HCC) 2019   On long term clopidogrel therapy    Osteoporosis    PONV (postoperative nausea and vomiting)    PVD (peripheral vascular disease) (HCC)    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   a.) LIMA-LAD, SVG-OM1   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HCC)    ST elevation myocardial infarction (STEMI) of inferior wall (HCC) 09/21/2006   a.) PCI 09/21/2006: 100% pRCA --> 3.0 x 23 vision with distal edge overlapped by a 2.5 x 8 mm Mini-Vision stent  due to a small edge dissection   SVT (supraventricular tachycardia) (HCC)    Syncope and collapse    Unstable angina (HCC)    Wears partial dentures     Past Surgical History:  Procedure Laterality Date   ABDOMINAL HYSTERECTOMY     CAROTID ANGIOGRAPHY N/A 01/13/2018   Procedure: CAROTID ANGIOGRAPHY;  Surgeon: Annice Needy, MD;  Location: [facility];  Service: Cardiovascular;  Laterality: N/A;   COLONOSCOPY     CORONARY ANGIOPLASTY WITH STENT PLACEMENT Left 09/21/2006   Procedure: CORONARY ANGIOPLASTY WITH STENT PLACEMENT; Location: [facility]; Surgeon: Jerrell Mylar, MD   CORONARY ARTERY BYPASS GRAFT N/A 07/07/2018   Procedure: CORONARY ARTERY BYPASS GRAFT; Location: [facility]; Surgeon: Cherlyn Labella, MD   ENDOBRONCHIAL ULTRASOUND Bilateral 09/30/2023   Procedure: ENDOBRONCHIAL ULTRASOUND;  Surgeon: Janann Colonel, MD;  Location: [facility];  Service: Pulmonary;  Laterality: Bilateral;   ENDOVASCULAR REPAIR/STENT GRAFT Bilateral 12/14/2020   Procedure: ENDOVASCULAR REPAIR/STENT GRAFT;  Surgeon: Annice Needy, MD;  Location: [facility];  Service: Cardiovascular;  Laterality: Bilateral;   iv INJECTIONS TO BACK     LEFT HEART CATH Right 07/07/2018   Procedure: Left Heart Cath and Coronary Angiography;  Surgeon: Laurier Nancy, MD;  Location: [facility];  Service: Cardiovascular;  Laterality: Right;   LEFT HEART CATH AND CORONARY ANGIOGRAPHY Left 06/17/2008    Procedure: LEFT HEART CATH AND CORONARY ANGIOGRAPHY; Location: [facility]; Surgeon: Cherlyn Labella, MD   LEFT HEART CATH AND CORONARY ANGIOGRAPHY Left 07/07/2018   Procedure: LEFT HEART CATH AND CORONARY ANGIOGRAPHY; Location: [facility]    Family Psychiatric History: I have reviewed family psychiatric history from progress note on 04/26/2020.  Family History:  Family History  Problem Relation Age of Onset   Breast cancer Mother 26   Pancreatic cancer Mother    Cirrhosis Father    Depression Sister    Breast cancer Maternal Aunt        mat aunt and great aunt    Social History: I have reviewed social history from progress note on 04/26/2020. Social History   Socioeconomic History   Marital status: Divorced    Spouse name: Not on file   Number of children: Not on file   Years of  education: Not on file   Highest education level: Not on file  Occupational History   Occupation: disabled  Tobacco Use   Smoking status: Former    Current packs/day: 0.00    Average packs/day: 1 pack/day for 43.8 years (43.8 ttl pk-yrs)    Types: Cigarettes    Start date: 46    Quit date: 09/19/2018    Years since quitting: 5.1   Smokeless tobacco: Never   Tobacco comments:    uses nicotine gums  Vaping Use   Vaping status: Former  Substance and Sexual Activity   Alcohol use: Yes    Comment: rare   Drug use: No   Sexual activity: Not Currently    Comment: not asked if sexually active  Other Topics Concern   Not on file  Social History Narrative   Not on file   Social Drivers of Health   Financial Resource Strain: Not on file  Food Insecurity: Not on file  Transportation Needs: Not on file  Physical Activity: Not on file  Stress: Not on file  Social Connections: Not on file    Allergies:  Allergies  Allergen Reactions   Effexor Xr [Venlafaxine Hcl]     suicidality   Penicillins Swelling   Amoxicillin-Pot Clavulanate    Keflex [Cephalexin] Hives    Metabolic Disorder Labs: No results  found for: "HGBA1C", "MPG" No results found for: "PROLACTIN" Lab Results  Component Value Date   CHOL 146 01/04/2023   TRIG 82 01/04/2023   HDL 61 01/04/2023   LDLCALC 69 01/04/2023   No results found for: "TSH"  Therapeutic Level Labs: No results found for: "LITHIUM" No results found for: "VALPROATE" No results found for: "CBMZ"  Current Medications: Current Outpatient Medications  Medication Sig Dispense Refill   aspirin EC 81 MG tablet Take 81 mg by mouth every evening.     atorvastatin (LIPITOR) 80 MG tablet TAKE 1 TABLET(80 MG) BY MOUTH AT BEDTIME (Patient taking differently: Take 80 mg by mouth at bedtime.) 90 tablet 0   clonazePAM (KLONOPIN) 0.5 MG tablet Take 0.5-1 tablets (0.25-0.5 mg total) by mouth daily as needed for anxiety. Use to only for severe anxiety attacks 21 tablet 0   clopidogrel (PLAVIX) 75 MG tablet TAKE 1 TABLET BY MOUTH EVERY DAY 90 tablet 1   DULoxetine (CYMBALTA) 30 MG capsule Take 1 capsule (30 mg total) by mouth 2 (two) times daily. 180 capsule 1   ezetimibe (ZETIA) 10 MG tablet TAKE 1 TABLET BY MOUTH EVERY DAY (Patient taking differently: Take 10 mg by mouth every morning.) 90 tablet 1   Glycopyrrolate-Formoterol (BEVESPI AEROSPHERE) 9-4.8 MCG/ACT AERO Inhale 2 puffs into the lungs 2 (two) times daily. 3 each 3   hydrOXYzine (VISTARIL) 25 MG capsule TAKE 1 CAPSULE BY MOUTH EVERY DAY AS NEEDED FOR ANXIETY AND 2 CAPSULES AT BEDTIME AS NEEDED FOR SLEEP, AS DIRECTED (Patient taking differently: 25 mg as needed. TAKE 1 CAPSULE BY MOUTH EVERY DAY AS NEEDED FOR ANXIETY AND 2 CAPSULES AT BEDTIME AS NEEDED FOR SLEEP, AS DIRECTED) 90 capsule 5   metoprolol succinate (TOPROL-XL) 50 MG 24 hr tablet Take 1 tablet (50 mg total) by mouth daily. (Patient taking differently: Take 50 mg by mouth every morning.) 90 tablet 1   Multiple Vitamin (MULTIVITAMIN) tablet Take 1 tablet by mouth 2 (two) times a week. Centrum Silver for Women     nicotine polacrilex (NICORETTE) 4 MG  gum Take 4 mg by mouth as needed for smoking cessation.  nitroGLYCERIN (NITROSTAT) 0.4 MG SL tablet Place 0.4 mg under the tongue every 5 (five) minutes x 3 doses as needed for chest pain.     Probiotic Product (PROBIOTIC DAILY PO) Take 1 capsule by mouth every other day.     traMADol HCl 100 MG TABS Take 1 tablet by mouth every 12 (twelve) hours as needed. 60 tablet 5   traZODone (DESYREL) 100 MG tablet Take 1 tablet (100 mg total) by mouth at bedtime. (Patient taking differently: Take 50-100 mg by mouth at bedtime.) 90 tablet 1   Cholecalciferol (VITAMIN D3) 1.25 MG (50000 UT) capsule TAKE 1 CAPSULE BY MOUTH 1 TIME WEEKLY (Patient not taking: Reported on 10/31/2023) 12 capsule 3   No current facility-administered medications for this visit.     Musculoskeletal: Strength & Muscle Tone: within normal limits Gait & Station: normal Patient leans: N/A  Psychiatric Specialty Exam: Review of Systems  Psychiatric/Behavioral:  Positive for dysphoric mood. The patient is nervous/anxious.     Blood pressure 114/74, pulse 83, temperature (!) 97.3 F (36.3 C), temperature source Skin, height 5\' 2"  (1.575 m), weight 118 lb (53.5 kg).Body mass index is 21.58 kg/m.  General Appearance: Guarded  Eye Contact:  Fair  Speech:  Clear and Coherent  Volume:  Normal  Mood:  Anxious and Depressed  Affect:  Tearful  Thought Process:  Goal Directed and Descriptions of Associations: Intact  Orientation:  Full (Time, Place, and Person)  Thought Content: Rumination   Suicidal Thoughts:  No  Homicidal Thoughts:  No  Memory:  Immediate;   Fair Recent;   Fair  Judgement:  Fair  Insight:  Fair  Psychomotor Activity:  Normal  Concentration:  Concentration: Fair and Attention Span: Fair  Recall:  [employer] of Knowledge: Fair  Language: Fair  Akathisia:  No  Handed:  Right  AIMS (if indicated): not done  Assets:  Community education officer  ADL's:  Intact  Cognition: WNL  Sleep:   Fair   Screenings: AIMS    Flowsheet Row Video Visit from 02/14/2022 in [facility] Office Visit from 01/16/2022 in [facility]  AIMS Total Score 0 0      GAD-7    Flowsheet Row Video Visit from 09/25/2023 in [facility] Office Visit from 09/05/2022 in [facility] Video Visit from 04/17/2022 in [facility] Video Visit from 02/14/2022 in [facility] Video Visit from 04/26/2021 in [facility]  Total GAD-7 Score 21 3 1 1 1       PHQ2-9    Flowsheet Row Video Visit from 09/25/2023 in [facility] Video Visit from 08/01/2023 in [facility] Video Visit from 02/19/2023 in [facility] Office Visit from 09/05/2022 in [facility] Office Visit from 08/02/2022 in [facility] Total Score 6 0 0 2 0  PHQ-9 Total Score 15 -- -- 5 --      Flowsheet Row Office Visit from 10/31/2023 in [facility] Admission (Discharged) from 09/30/2023 in [facility] Video Visit from 09/25/2023 in [facility]  C-SSRS RISK CATEGORY No Risk No Risk No Risk        Assessment and Plan:  Melissa Montes is a 67 year old Caucasian female divorced, disabled, lives in [place], has a history of bipolar disorder, GAD, aortic aneurysm status post repair, diverticulitis was evaluated in office today.  Patient with recent terminal lung cancer diagnosis, currently presents with worsening depression and anxiety  symptoms, discussed assessment and plan as noted below.   Generalized Anxiety Disorder-unstable Managed with Cymbalta 30 mg twice daily and hydroxyzine 25 mg as needed. Hydroxyzine is somewhat helpful, but she is interested in trying clonazepam for more effective relief.   - Prescribe clonazepam 0.25-0.5 mg daily as needed.  Patient advised to limit use.Reviewed [place] PMP AWARxE - Instruct to avoid taking clonazepam and tramadol together due to interaction risk   - Monitor for effectiveness and side effects of clonazepam    Depression-unstable Expresses feelings of hopelessness and isolation. Declined in-person therapy due to stress of appointments but open to video visits.  Patient will let this provider know if interested. - Encourage considering video visits with a therapist   - Provide information on available mental health resources and support services     Insomnia-stable Managed with trazodone 100 mg at bedtime. Reports it is effective for sleep.   - Continue trazodone 100 mg at bedtime  - Hydroxyzine 25 mg by mouth daily as needed and 2 capsules at bedtime as needed for sleep and anxiety.. - Monitor for any changes in sleep patterns or effectiveness     Lung Cancer   Diagnosed with aggressive lung cancer. Prognosis without treatment is 6 months to a year. Treatment could potentially double life expectancy but has significant side effects. She has decided against treatment due to anticipated side effects and current quality of life. Discussed hospice care options and the different levels of hospice support available. She is not ready for hospice yet but understands the benefits and resources available.   - Discuss hospice care options with oncologist   - Consider initiating hospice services for emotional support and resource connection   - Maintain communication with primary care physician and oncologist for ongoing support and resources      Follow-up   - Schedule follow-up  appointment in two months   - Encourage regular contact with primary care physician and oncologist    Collaboration of Care: Collaboration of Care: Patient refused AEB patient declined referral for therapy  Patient/Guardian was advised Release of Information must be obtained prior to any record release in order to collaborate their care with an outside provider. Patient/Guardian was advised if they have not already done so to contact the registration department to sign all necessary forms in order for [place] to release information regarding their care.   Consent: Patient/Guardian gives verbal consent for treatment and assignment of benefits for services provided during this visit. Patient/Guardian expressed understanding and agreed to proceed.   This note was generated in part or whole with voice recognition software. Voice recognition is usually quite accurate but there are transcription errors that can and very often do occur. I apologize for any typographical errors that were not detected and corrected.    Jomarie Longs, MD 11/01/2023, 3:23 PM

## 2023-10-31 NOTE — Progress Notes (Signed)
Met with patient in the lab to collect blood sample for Tempus xF liquid biopsy. All questions answered during visit. Financial assistance application completed and faxed. Pt informed that will be called with results once reported. Pt verbalized understanding. Tempus order completed online. Blood sample will be collected by lab staff and sent out via [employer].

## 2023-11-10 DIAGNOSIS — C3410 Malignant neoplasm of upper lobe, unspecified bronchus or lung: Secondary | ICD-10-CM | POA: Diagnosis not present

## 2023-11-14 ENCOUNTER — Encounter: Payer: Self-pay | Admitting: Internal Medicine

## 2023-11-15 LAB — ACID FAST CULTURE WITH REFLEXED SENSITIVITIES (MYCOBACTERIA): Acid Fast Culture: NEGATIVE

## 2023-11-18 ENCOUNTER — Telehealth: Payer: Self-pay | Admitting: *Deleted

## 2023-11-18 NOTE — Telephone Encounter (Signed)
-----   Message from Earna Coder sent at 11/11/2023 11:19 AM EST ----- Regarding: RE: Tempus results [place]- Thank you for the update and follow-up.  Please print me a copy of the Tempus results-I cannot find it in the EMR.  I agree with the treatment options that you listed.  Thanks GB ----- Message ----- From: Glory Buff, RN Sent: 11/11/2023  10:39 AM EST To: Earna Coder, MD Subject: Tempus results                                 Good morning! Pt came in a couple weeks ago and we were able to collect her blood sample for Tempus liquid biopsy. Her results came back and unfortunately do not show any actionable mutations. I am going to call pt to let her know about her results so she can make a decision regarding treatment. Just to confirm that her treatment options are concurrent chemo/radiation or chemo+immunotherapy, correct?  [facility]

## 2023-11-18 NOTE — Telephone Encounter (Signed)
Called pt and informed of Tempus liquid biopsy results. Informed of MD recommendations for treatment options. All questions answered during call. Pt stated that she will think about her options. Informed pt that will give her a call next week to follow up on her decision. Encouraged pt to call with any questions or needs. Pt verbalized understanding.

## 2023-11-22 ENCOUNTER — Ambulatory Visit: Payer: Medicare HMO | Admitting: Internal Medicine

## 2023-11-27 NOTE — Telephone Encounter (Signed)
 Phone call made to patient to follow up on decision about pursuing treatment. Pt did not answer. Message left with pt to call back when she is available.

## 2023-12-02 ENCOUNTER — Telehealth: Payer: Self-pay | Admitting: Internal Medicine

## 2023-12-02 NOTE — Telephone Encounter (Signed)
 Pt called and wants to make an appt with Dr.B.   The last LOS states "follow up TBD"  Pt phone number on chart is correct and pt stated she needs late afternoon appts after 2pm. Please call pt with the update.

## 2023-12-06 ENCOUNTER — Encounter: Payer: Self-pay | Admitting: Internal Medicine

## 2023-12-06 ENCOUNTER — Inpatient Hospital Stay: Payer: Medicare HMO | Attending: Internal Medicine | Admitting: Internal Medicine

## 2023-12-06 ENCOUNTER — Other Ambulatory Visit: Payer: Self-pay | Admitting: Cardiovascular Disease

## 2023-12-06 VITALS — BP 108/84 | HR 93 | Temp 99.0°F | Resp 18 | Wt 115.0 lb

## 2023-12-06 DIAGNOSIS — Z800 Family history of malignant neoplasm of digestive organs: Secondary | ICD-10-CM | POA: Insufficient documentation

## 2023-12-06 DIAGNOSIS — J449 Chronic obstructive pulmonary disease, unspecified: Secondary | ICD-10-CM | POA: Insufficient documentation

## 2023-12-06 DIAGNOSIS — Z87891 Personal history of nicotine dependence: Secondary | ICD-10-CM | POA: Insufficient documentation

## 2023-12-06 DIAGNOSIS — Z803 Family history of malignant neoplasm of breast: Secondary | ICD-10-CM | POA: Diagnosis not present

## 2023-12-06 DIAGNOSIS — I2510 Atherosclerotic heart disease of native coronary artery without angina pectoris: Secondary | ICD-10-CM | POA: Insufficient documentation

## 2023-12-06 DIAGNOSIS — C3431 Malignant neoplasm of lower lobe, right bronchus or lung: Secondary | ICD-10-CM | POA: Diagnosis not present

## 2023-12-06 DIAGNOSIS — R519 Headache, unspecified: Secondary | ICD-10-CM | POA: Diagnosis not present

## 2023-12-06 MED ORDER — HYDROCODONE-ACETAMINOPHEN 5-325 MG PO TABS: 1.0000 | ORAL_TABLET | Freq: Three times a day (TID) | ORAL | 0 refills | Status: DC | PRN

## 2023-12-06 NOTE — Progress Notes (Signed)
Patient has been having worse shortness of breath ongoing for about a month or two. She is feeling more fatigued recently.   She would like to go ahead and schedule her MRI on her brain.  She sees a Librarian, academic for her back pain, tramadol isn't working anymore.  Last night sharp stabbing pain occurred on her right side of her right hip, pain lasted over an hour, that is the first time she has ever had that pain before, rates her pain at about a 10.

## 2023-12-06 NOTE — Assessment & Plan Note (Addendum)
#    Lung-RADS 4X, highly suspicious.Development of a right lower lobe pleural-based dominant nodule, multiple areas of right-sided pleural thickening, and right infrahilar soft tissue fullness (likely adenopathy). 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 OCT 2024- Multifocal pleural metastases/malignancy in the right hemithorax. Associated small subcarinal and right perihilar nodal metastases. Stage IIIB-   # EBUS [Dr.Assaker; pul]:  POSITIVE FOR MALIGNANCY; METASTATIC NON-SMALL CELL CARCINOMA IS PRESENT, FAVOR ADENOCARCINOMA. NGS- QNS-declined repeat Bx.  Tempus-Liquid Bx: negative for any actionable mutations.   # Patient again declined any further therapy for her lung cancer.  Recommend hospice see discussion below  # Worsening left hip- on tramadol not currently controlled.  Patient declines imaging.  Recommend hydrocodone every 8 hours as needed.  Discussed the potential side effects.  See discussion below regarding hospice  # Headaches: intermittent but progressively worse- recommend MRI Brain ASAP  # COPD- severe- limiting patients ADLS.   # cardiac: Prior history of CAD; as per patient most recent workup with PCP/cardiologist,  Dr. Welton Flakes showed "significant EKG changes".  However patient is not inclined to proceed with any interventions at this time given her lung diagnosis- stable.   #  I also discussed given general less than 6 months of life expectancy, I introduced hospice philosophy to the patient and family.  This time patient seems to be more willing to have a discussion.  I discussed that goal of care should be directed to symptom management rather than treating the underlying disease; and in the process help improve quality of life rather than quantity.  Discussed with hospice team would include-nurse, nurse aide, social worker and chaplain for help take care of patient with physical/emotional needs.  I also reviewed in cause  of emergency/acute cardiorespiratory failure patient could potentially undergo interventions including but not limited to chest compressions, intubation, defibrillation and antiarrhythmic to the right.  However given patient's frail status and incurable malignancy I would  recommend against any aggressive interventions.  I would recommend no code.  However no decisions patient's made at this time. Recommend await evaluation with hospice.   # DISPOSITION: # Hospice referral re: Lung cancer # MRI Brain ASAP # follow up TBD- Dr.B  # 40 minutes face-to-face with the patient discussing the above plan of care; more than 50% of time spent on prognosis/ natural history; counseling and coordination.  Cc; Dr.Neelam Khan/[facility].

## 2023-12-06 NOTE — Progress Notes (Signed)
[facility] CONSULT NOTE  Patient Care Team: Margaretann Loveless, MD as PCP - General (Internal Medicine) Glory Buff, RN as Oncology Nurse Navigator Earna Coder, MD as Consulting Physician (Oncology)  CHIEF COMPLAINTS/PURPOSE OF CONSULTATION: Lung cancer  Oncology History Overview Note  IMPRESSION: 1. Lung-RADS 4X, highly suspicious. Additional imaging evaluation or consultation with Pulmonology or Thoracic Surgery recommended. Development of a right lower lobe pleural-based dominant nodule, multiple areas of right-sided pleural thickening, and right infrahilar soft tissue fullness (likely adenopathy). 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 2. Incidental findings, including: Cholelithiasis. Aortic atherosclerosis (ICD10-I70.0) and emphysema (ICD10-J43.9). Left nephrolithiasis.   Cancer of lower lobe of right lung (HCC)  10/06/2023 Initial Diagnosis   Cancer of lower lobe of right lung (HCC)   10/07/2023 Cancer Staging   Staging form: Lung, AJCC 8th Edition - Clinical: Stage IIIB (cT4, cN2, cM0) - Signed by Earna Coder, MD on 10/10/2023     HISTORY OF PRESENTING ILLNESS: Patient ambulating-independently. Accompanied by friend, sarah.   Melissa Montes 68 y.o.  female pleasant patient with a longstanding history of smoking currently quit with severe COPD CAD with stage III lung cancer on surveillance [declined therapy] is here for a follow up..   Patient has been having worse shortness of breath ongoing for about a month or two. She is feeling more fatigued recently.  Her COPD is also limiting her quality of life and ADLs.  No worsening cough or hemoptysis.  Patient has poor social support.   Patient noted to have worsening headaches.  Also having memory issues.   Last night sharp stabbing pain occurred on her right side of her right hip, pain lasted over an hour, that is the first  time she has ever had that pain before, rates her pain at about a 10.  Pain well-controlled on tramadol.  Review of Systems  Constitutional:  Positive for malaise/fatigue and weight loss. Negative for chills, diaphoresis and fever.  HENT:  Negative for nosebleeds and sore throat.   Eyes:  Negative for double vision.  Respiratory:  Positive for shortness of breath. Negative for cough, hemoptysis, sputum production and wheezing.   Cardiovascular:  Negative for chest pain, palpitations, orthopnea and leg swelling.  Gastrointestinal:  Negative for abdominal pain, blood in stool, constipation, diarrhea, heartburn, melena, nausea and vomiting.  Genitourinary:  Negative for dysuria, frequency and urgency.  Musculoskeletal:  Positive for back pain. Negative for joint pain.  Skin: Negative.  Negative for itching and rash.  Neurological:  Positive for weakness. Negative for dizziness, tingling, focal weakness and headaches.  Endo/Heme/Allergies:  Does not bruise/bleed easily.  Psychiatric/Behavioral:  Negative for depression. The patient is not nervous/anxious and does not have insomnia.     MEDICAL HISTORY:  Past Medical History:  Diagnosis Date   AAA (abdominal aortic aneurysm) (HCC)    a.) s/p EVAR 12/14/2020   ADD (attention deficit disorder)    a.) followed by psychiatry   Anxiety    Atherosclerosis of abdominal aorta (HCC)    Bilateral carotid artery stenosis    Bipolar disorder (HCC)    a.) followed by psychiatry   CAD (coronary artery disease)    a.) LHC/PCI 09/21/2006: 100pRCA (3.0 x 23 Vision with overlapping 2.5 x 8 mm Mini-Vision d/t sm edge dissection), 10% LM, 10% pLCx, 20% mLAD, 90%D1; b.) LHC 06/17/2008: 20% pRCA, 20% p-mRCA, 30% LM, 305 LCx, 30% pLAD, 30% mLAD, 70% D1 - med mgmt; c.)  MV 12/24/2009: no ischemia; d.) LHC 07/07/2018: 30% p-mRCA, 80% mLM --> CVTS; e.) s/p 2v CABG (LIMA-LAD, SVG-OM1) 07/06/2018   Chronic hepatitis (HCC)    Chronic pain syndrome    a.) followed by  pain management   Complication of anesthesia    a.) PONV   COPD (chronic obstructive pulmonary disease) (HCC)    DDD (degenerative disc disease), lumbar    Depression    Diastolic dysfunction    a.) TTE 10/16/20214: EF 70%, no RWMAs, G1DD, normal RVSF, triv TR   Diverticulitis    Dyspnea    History of kidney stones    History of tobacco abuse    Hyperlipidemia    Hypertension    Insomnia    a.) takes trazodone PRN   Long term current use of aspirin    Lumbar spondylosis    Lung cancer (HCC)    Lung nodules    Mediastinal lymphadenopathy    MGUS (monoclonal gammopathy of unknown significance)    Migraine    Myocardial infarct (HCC) 2019   On long term clopidogrel therapy    Osteoporosis    PONV (postoperative nausea and vomiting)    PVD (peripheral vascular disease) (HCC)    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   a.) LIMA-LAD, SVG-OM1   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HCC)    ST elevation myocardial infarction (STEMI) of inferior wall (HCC) 09/21/2006   a.) PCI 09/21/2006: 100% pRCA --> 3.0 x 23 vision with distal edge overlapped by a 2.5 x 8 mm Mini-Vision stent  due to a small edge dissection   SVT (supraventricular tachycardia) (HCC)    Syncope and collapse    Unstable angina (HCC)    Wears partial dentures     SURGICAL HISTORY: Past Surgical History:  Procedure Laterality Date   ABDOMINAL HYSTERECTOMY     CAROTID ANGIOGRAPHY N/A 01/13/2018   Procedure: CAROTID ANGIOGRAPHY;  Surgeon: Annice Needy, MD;  Location: [facility];  Service: Cardiovascular;  Laterality: N/A;   COLONOSCOPY     CORONARY ANGIOPLASTY WITH STENT PLACEMENT Left 09/21/2006   Procedure: CORONARY ANGIOPLASTY WITH STENT PLACEMENT; Location: [facility]; Surgeon: Jerrell Mylar, MD   CORONARY ARTERY BYPASS GRAFT N/A 07/07/2018   Procedure: CORONARY ARTERY BYPASS GRAFT; Location: [facility]; Surgeon: Cherlyn Labella, MD   ENDOBRONCHIAL ULTRASOUND Bilateral 09/30/2023   Procedure: ENDOBRONCHIAL ULTRASOUND;  Surgeon:  Janann Colonel, MD;  Location: [facility];  Service: Pulmonary;  Laterality: Bilateral;   ENDOVASCULAR REPAIR/STENT GRAFT Bilateral 12/14/2020   Procedure: ENDOVASCULAR REPAIR/STENT GRAFT;  Surgeon: Annice Needy, MD;  Location: [facility];  Service: Cardiovascular;  Laterality: Bilateral;   iv INJECTIONS TO BACK     LEFT HEART CATH Right 07/07/2018   Procedure: Left Heart Cath and Coronary Angiography;  Surgeon: Laurier Nancy, MD;  Location: [facility];  Service: Cardiovascular;  Laterality: Right;   LEFT HEART CATH AND CORONARY ANGIOGRAPHY Left 06/17/2008   Procedure: LEFT HEART CATH AND CORONARY ANGIOGRAPHY; Location: [facility]; Surgeon: Cherlyn Labella, MD   LEFT HEART CATH AND CORONARY ANGIOGRAPHY Left 07/07/2018   Procedure: LEFT HEART CATH AND CORONARY ANGIOGRAPHY; Location: [facility]    SOCIAL HISTORY: Social History   Socioeconomic History   Marital status: Divorced    Spouse name: Not on file   Number of children: Not on file   Years of education: Not on file   Highest education level: Not on file  Occupational History   Occupation: disabled  Tobacco Use   Smoking status:  Former    Current packs/day: 0.00    Average packs/day: 1 pack/day for 43.8 years (43.8 ttl pk-yrs)    Types: Cigarettes    Start date: 70    Quit date: 09/19/2018    Years since quitting: 5.2   Smokeless tobacco: Never   Tobacco comments:    uses nicotine gums  Vaping Use   Vaping status: Former  Substance and Sexual Activity   Alcohol use: Yes    Comment: rare   Drug use: No   Sexual activity: Not Currently    Comment: not asked if sexually active  Other Topics Concern   Not on file  Social History Narrative   Not on file   Social Drivers of Health   Financial Resource Strain: Not on file  Food Insecurity: Not on file  Transportation Needs: Not on file  Physical Activity: Not on file  Stress: Not on file  Social Connections: Not on file  Intimate Partner Violence: Not  on file    FAMILY HISTORY: Family History  Problem Relation Age of Onset   Breast cancer Mother 41   Pancreatic cancer Mother    Cirrhosis Father    Depression Sister    Breast cancer Maternal Aunt        mat aunt and great aunt    ALLERGIES:  is allergic to effexor xr [venlafaxine hcl], penicillins, amoxicillin-pot clavulanate, and keflex [cephalexin].  MEDICATIONS:  Current Outpatient Medications  Medication Sig Dispense Refill   HYDROcodone-acetaminophen (NORCO/VICODIN) 5-325 MG tablet Take 1 tablet by mouth every 8 (eight) hours as needed for moderate pain (pain score 4-6). 60 tablet 0   aspirin EC 81 MG tablet Take 81 mg by mouth every evening.     atorvastatin (LIPITOR) 80 MG tablet TAKE 1 TABLET(80 MG) BY MOUTH AT BEDTIME 90 tablet 0   Cholecalciferol (VITAMIN D3) 1.25 MG (50000 UT) capsule TAKE 1 CAPSULE BY MOUTH 1 TIME WEEKLY (Patient not taking: Reported on 10/31/2023) 12 capsule 3   clonazePAM (KLONOPIN) 0.5 MG tablet Take 0.5-1 tablets (0.25-0.5 mg total) by mouth daily as needed for anxiety. Use to only for severe anxiety attacks 21 tablet 0   clopidogrel (PLAVIX) 75 MG tablet TAKE 1 TABLET BY MOUTH EVERY DAY 90 tablet 1   DULoxetine (CYMBALTA) 30 MG capsule Take 1 capsule (30 mg total) by mouth 2 (two) times daily. 180 capsule 1   ezetimibe (ZETIA) 10 MG tablet TAKE 1 TABLET BY MOUTH EVERY DAY (Patient taking differently: Take 10 mg by mouth every morning.) 90 tablet 1   Glycopyrrolate-Formoterol (BEVESPI AEROSPHERE) 9-4.8 MCG/ACT AERO Inhale 2 puffs into the lungs 2 (two) times daily. 3 each 3   hydrOXYzine (VISTARIL) 25 MG capsule TAKE 1 CAPSULE BY MOUTH EVERY DAY AS NEEDED FOR ANXIETY AND 2 CAPSULES AT BEDTIME AS NEEDED FOR SLEEP, AS DIRECTED (Patient taking differently: 25 mg as needed. TAKE 1 CAPSULE BY MOUTH EVERY DAY AS NEEDED FOR ANXIETY AND 2 CAPSULES AT BEDTIME AS NEEDED FOR SLEEP, AS DIRECTED) 90 capsule 5   metoprolol succinate (TOPROL-XL) 50 MG 24 hr tablet  Take 1 tablet (50 mg total) by mouth daily. 30 tablet 3   Multiple Vitamin (MULTIVITAMIN) tablet Take 1 tablet by mouth 2 (two) times a week. Centrum Silver for Women     nicotine polacrilex (NICORETTE) 4 MG gum Take 4 mg by mouth as needed for smoking cessation.     nitroGLYCERIN (NITROSTAT) 0.4 MG SL tablet Place 0.4 mg under the tongue every 5 (  five) minutes x 3 doses as needed for chest pain.     Probiotic Product (PROBIOTIC DAILY PO) Take 1 capsule by mouth every other day.     traMADol HCl 100 MG TABS Take 1 tablet by mouth every 12 (twelve) hours as needed. 60 tablet 5   traZODone (DESYREL) 100 MG tablet Take 1 tablet (100 mg total) by mouth at bedtime. (Patient taking differently: Take 50-100 mg by mouth at bedtime.) 90 tablet 1   No current facility-administered medications for this visit.    PHYSICAL EXAMINATION:   Vitals:   12/06/23 1431  BP: 108/84  Pulse: 93  Resp: 18  Temp: 99 F (37.2 C)  SpO2: 95%   Filed Weights   12/06/23 1431  Weight: 115 lb (52.2 kg)    Physical Exam Vitals and nursing note reviewed.  HENT:     Head: Normocephalic and atraumatic.     Mouth/Throat:     Pharynx: Oropharynx is clear.  Eyes:     Extraocular Movements: Extraocular movements intact.     Pupils: Pupils are equal, round, and reactive to light.  Cardiovascular:     Rate and Rhythm: Normal rate and regular rhythm.  Pulmonary:     Comments: Decreased breath sounds bilaterally.  Abdominal:     Palpations: Abdomen is soft.  Musculoskeletal:        General: Normal range of motion.     Cervical back: Normal range of motion.  Skin:    General: Skin is warm.  Neurological:     General: No focal deficit present.     Mental Status: She is alert and oriented to person, place, and time.  Psychiatric:        Behavior: Behavior normal.        Judgment: Judgment normal.     LABORATORY DATA:  I have reviewed the data as listed Lab Results  Component Value Date   WBC 10.6 (H)  09/27/2023   HGB 14.1 09/27/2023   HCT 41.1 09/27/2023   MCV 88.2 09/27/2023   PLT 278 09/27/2023   Recent Labs    01/04/23 1426 09/27/23 1418  NA 140 136  K 4.6 3.9  CL 103 102  CO2 23 26  GLUCOSE 99 107*  BUN 17 17  CREATININE 0.76 0.77  CALCIUM 9.3 9.3  GFRNONAA  --  >60  PROT 6.4  --   ALBUMIN 4.3  --   AST 20  --   ALT 17  --   ALKPHOS 135*  --   BILITOT 0.4  --     RADIOGRAPHIC STUDIES: I have personally reviewed the radiological images as listed and agreed with the findings in the report. No results found.   Cancer of lower lobe of right lung (HCC) #  Lung-RADS 4X, highly suspicious.Development of a right lower lobe pleural-based dominant nodule, multiple areas of right-sided pleural thickening, and right infrahilar soft tissue fullness (likely adenopathy). 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 OCT 2024- Multifocal pleural metastases/malignancy in the right hemithorax. Associated small subcarinal and right perihilar nodal metastases. Stage IIIB-   # EBUS [Dr.Assaker; pul]:  POSITIVE FOR MALIGNANCY; METASTATIC NON-SMALL CELL CARCINOMA IS PRESENT, FAVOR ADENOCARCINOMA. NGS- QNS-declined repeat Bx.  Tempus-Liquid Bx: negative for any actionable mutations.   # Patient again declined any further therapy for her lung cancer.  Recommend hospice see discussion below  # Worsening left hip- on tramadol not currently controlled.  Patient declines imaging.  Recommend hydrocodone  every 8 hours as needed.  Discussed the potential side effects.  See discussion below regarding hospice  # Headaches: intermittent but progressively worse- recommend MRI Brain ASAP  # COPD- severe- limiting patients ADLS.   # cardiac: Prior history of CAD; as per patient most recent workup with PCP/cardiologist,  Dr. Welton Flakes showed "significant EKG changes".  However patient is not inclined to proceed with any interventions at this time given  her lung diagnosis- stable.   #  I also discussed given general less than 6 months of life expectancy, I introduced hospice philosophy to the patient and family.  This time patient seems to be more willing to have a discussion.  I discussed that goal of care should be directed to symptom management rather than treating the underlying disease; and in the process help improve quality of life rather than quantity.  Discussed with hospice team would include-nurse, nurse aide, social worker and chaplain for help take care of patient with physical/emotional needs.  I also reviewed in cause of emergency/acute cardiorespiratory failure patient could potentially undergo interventions including but not limited to chest compressions, intubation, defibrillation and antiarrhythmic to the right.  However given patient's frail status and incurable malignancy I would  recommend against any aggressive interventions.  I would recommend no code.  However no decisions patient's made at this time. Recommend await evaluation with hospice.   # DISPOSITION: # Hospice referral re: Lung cancer # MRI Brain ASAP # follow up TBD- Dr.B  # 40 minutes face-to-face with the patient discussing the above plan of care; more than 50% of time spent on prognosis/ natural history; counseling and coordination.  Cc; Dr.Neelam Khan/[facility].   Above plan of care was discussed with patient/family in detail.  My contact information was given to the patient/family.     Earna Coder, MD 12/06/2023 3:55 PM

## 2023-12-09 ENCOUNTER — Telehealth: Payer: Self-pay | Admitting: Student in an Organized Health Care Education/Training Program

## 2023-12-09 NOTE — Telephone Encounter (Signed)
noted 

## 2023-12-09 NOTE — Telephone Encounter (Signed)
FYI, PT called stated that she has been placed on hospice for lung cancer. Cancel all of her appt.

## 2023-12-10 ENCOUNTER — Telehealth: Payer: Self-pay | Admitting: *Deleted

## 2023-12-10 ENCOUNTER — Encounter: Payer: Self-pay | Admitting: *Deleted

## 2023-12-10 NOTE — Telephone Encounter (Signed)
Got a call from the dentist for an extraction of the patient and they wanted more information to see if that was done to be okay from the dental and the oncology.  I spoke to [facility] and told her that the patient is not getting any kind of treatments right now she does not want them she wants to have hospice and there was a referral put in for the hospice so Dr. Donneta Romberg is fine with the patient having the extraction of teeth.  Tammy wanted to have a letter and so I made 1 and I faxed it over to the fax number of [phone]. Transmission fax went through

## 2023-12-16 ENCOUNTER — Ambulatory Visit: Payer: Medicare HMO

## 2023-12-23 ENCOUNTER — Encounter: Payer: Self-pay | Admitting: Internal Medicine

## 2023-12-23 ENCOUNTER — Encounter: Payer: Self-pay | Admitting: Pulmonary Disease

## 2023-12-24 ENCOUNTER — Ambulatory Visit: Payer: Medicare HMO | Admitting: Student in an Organized Health Care Education/Training Program

## 2023-12-30 ENCOUNTER — Encounter: Payer: Self-pay | Admitting: Psychiatry

## 2023-12-30 ENCOUNTER — Telehealth: Payer: Medicare HMO | Admitting: Psychiatry

## 2023-12-30 DIAGNOSIS — F411 Generalized anxiety disorder: Secondary | ICD-10-CM

## 2023-12-30 DIAGNOSIS — F3176 Bipolar disorder, in full remission, most recent episode depressed: Secondary | ICD-10-CM | POA: Diagnosis not present

## 2023-12-30 DIAGNOSIS — G4701 Insomnia due to medical condition: Secondary | ICD-10-CM | POA: Diagnosis not present

## 2023-12-30 NOTE — Progress Notes (Signed)
Virtual Visit via Video Note  I connected with Melissa Montes on 12/30/23 at  4:30 PM EST by a video enabled telemedicine application and verified that I am speaking with the correct person using two identifiers.  Location Provider Location : [facility] Patient Location : Home  Participants: Patient , Provider   I discussed the limitations of evaluation and management by telemedicine and the availability of in person appointments. The patient expressed understanding and agreed to proceed.   I discussed the assessment and treatment plan with the patient. The patient was provided an opportunity to ask questions and all were answered. The patient agreed with the plan and demonstrated an understanding of the instructions.   The patient was advised to call back or seek an in-person evaluation if the symptoms worsen or if the condition fails to improve as anticipated.   BH MD OP Progress Note  12/31/2023 1:48 PM Donyea Gafford  MRN:  [id number]  Chief Complaint:  Chief Complaint  Patient presents with   Follow-up   Anxiety   Medication Refill   HPI: Melissa Montes is a 68 year old Caucasian female on disability, divorced, lives in [place], has a history of bipolar disorder, GAD, diverticulitis, abdominal aortic aneurysm, chronic pain was evaluated by telemedicine today.  The patient, with a recent diagnosis of lung cancer, presents for management of mood symptoms and sleep.  The patient has been recently diagnosed with lung cancer and has enrolled in hospice care for palliative and comfort management. She has opted to use the hospice's physician for convenience, which has limited her access to previous healthcare providers. Since enrolling in hospice, no further diagnostic tests, such as MRI, have been conducted as testing has ceased.  She experiences worsening back pain, which is partially alleviated by oxycodone 10 mg and prednisone 10 mg. There is a concern about potential bone  metastasis, but no tests are being conducted to confirm this. She has discontinued tramadol, Zetia, and atorvastatin, but continues to take metoprolol and Cymbalta (duloxetine).  Denies any side effects to Cymbalta.  Her sleep is described as intermittent, with difficulty sleeping due to racing thoughts and anxiety about her limited time. She has been prescribed clonazepam for anxiety and hydroxyzine pamoate, but she forgot about the clonazepam until recently. She has had two or three difficult nights in the past two weeks.  Socially, she has support from her sister, who provides financial assistance, and her brother-in-law and niece, who visit weekly. She finds it challenging to be around people due to the constant presence of her diagnosis on her mind. She has access to a chaplain/counselor through hospice but has not engaged in regular therapy sessions.  She currently denies any suicidality, homicidality or perceptual disturbances.  Patient appeared to be alert, oriented to person place time situation.  Visit Diagnosis:    ICD-10-CM   1. GAD (generalized anxiety disorder)  F41.1     2. Bipolar disorder, in full remission, most recent episode depressed (HCC)  F31.76     3. Insomnia due to medical condition  G47.01    Pain      Past Psychiatric History: I have reviewed past psychiatric history from progress note on 04/26/2020.  Past Medical History:  Past Medical History:  Diagnosis Date   AAA (abdominal aortic aneurysm) (HCC)    a.) s/p EVAR 12/14/2020   ADD (attention deficit disorder)    a.) followed by psychiatry   Anxiety    Atherosclerosis of abdominal aorta (HCC)  Bilateral carotid artery stenosis    Bipolar disorder (HCC)    a.) followed by psychiatry   CAD (coronary artery disease)    a.) LHC/PCI 09/21/2006: 100pRCA (3.0 x 23 Vision with overlapping 2.5 x 8 mm Mini-Vision d/t sm edge dissection), 10% LM, 10% pLCx, 20% mLAD, 90%D1; b.) LHC 06/17/2008: 20% pRCA, 20%  p-mRCA, 30% LM, 305 LCx, 30% pLAD, 30% mLAD, 70% D1 - med mgmt; c.) MV 12/24/2009: no ischemia; d.) LHC 07/07/2018: 30% p-mRCA, 80% mLM --> CVTS; e.) s/p 2v CABG (LIMA-LAD, SVG-OM1) 07/06/2018   Chronic hepatitis (HCC)    Chronic pain syndrome    a.) followed by pain management   Complication of anesthesia    a.) PONV   COPD (chronic obstructive pulmonary disease) (HCC)    DDD (degenerative disc disease), lumbar    Depression    Diastolic dysfunction    a.) TTE 10/16/20214: EF 70%, no RWMAs, G1DD, normal RVSF, triv TR   Diverticulitis    Dyspnea    History of kidney stones    History of tobacco abuse    Hyperlipidemia    Hypertension    Insomnia    a.) takes trazodone PRN   Long term current use of aspirin    Lumbar spondylosis    Lung cancer (HCC)    Lung nodules    Mediastinal lymphadenopathy    MGUS (monoclonal gammopathy of unknown significance)    Migraine    Myocardial infarct (HCC) 2019   On long term clopidogrel therapy    Osteoporosis    PONV (postoperative nausea and vomiting)    PVD (peripheral vascular disease) (HCC)    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   a.) LIMA-LAD, SVG-OM1   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HCC)    ST elevation myocardial infarction (STEMI) of inferior wall (HCC) 09/21/2006   a.) PCI 09/21/2006: 100% pRCA --> 3.0 x 23 vision with distal edge overlapped by a 2.5 x 8 mm Mini-Vision stent  due to a small edge dissection   SVT (supraventricular tachycardia) (HCC)    Syncope and collapse    Unstable angina (HCC)    Wears partial dentures     Past Surgical History:  Procedure Laterality Date   ABDOMINAL HYSTERECTOMY     CAROTID ANGIOGRAPHY N/A 01/13/2018   Procedure: CAROTID ANGIOGRAPHY;  Surgeon: Annice Needy, MD;  Location: [facility];  Service: Cardiovascular;  Laterality: N/A;   COLONOSCOPY     CORONARY ANGIOPLASTY WITH STENT PLACEMENT Left 09/21/2006   Procedure: CORONARY ANGIOPLASTY WITH STENT PLACEMENT; Location: [facility]; Surgeon:  Jerrell Mylar, MD   CORONARY ARTERY BYPASS GRAFT N/A 07/07/2018   Procedure: CORONARY ARTERY BYPASS GRAFT; Location: [facility]; Surgeon: Cherlyn Labella, MD   ENDOBRONCHIAL ULTRASOUND Bilateral 09/30/2023   Procedure: ENDOBRONCHIAL ULTRASOUND;  Surgeon: Janann Colonel, MD;  Location: [facility];  Service: Pulmonary;  Laterality: Bilateral;   ENDOVASCULAR REPAIR/STENT GRAFT Bilateral 12/14/2020   Procedure: ENDOVASCULAR REPAIR/STENT GRAFT;  Surgeon: Annice Needy, MD;  Location: [facility];  Service: Cardiovascular;  Laterality: Bilateral;   iv INJECTIONS TO BACK     LEFT HEART CATH Right 07/07/2018   Procedure: Left Heart Cath and Coronary Angiography;  Surgeon: Laurier Nancy, MD;  Location: [facility];  Service: Cardiovascular;  Laterality: Right;   LEFT HEART CATH AND CORONARY ANGIOGRAPHY Left 06/17/2008   Procedure: LEFT HEART CATH AND CORONARY ANGIOGRAPHY; Location: [facility]; Surgeon: Cherlyn Labella, MD   LEFT HEART CATH AND CORONARY ANGIOGRAPHY Left 07/07/2018  Procedure: LEFT HEART CATH AND CORONARY ANGIOGRAPHY; Location: [facility]    Family Psychiatric History: I have reviewed family psychiatric history from progress note on 04/26/2020.  Family History:  Family History  Problem Relation Age of Onset   Breast cancer Mother 68   Pancreatic cancer Mother    Cirrhosis Father    Depression Sister    Breast cancer Maternal Aunt        mat aunt and great aunt    Social History: I have reviewed social history from progress note on 04/26/2020. Social History   Socioeconomic History   Marital status: Divorced    Spouse name: Not on file   Number of children: Not on file   Years of education: Not on file   Highest education level: Not on file  Occupational History   Occupation: disabled  Tobacco Use   Smoking status: Former    Current packs/day: 0.00    Average packs/day: 1 pack/day for 43.8 years (43.8 ttl pk-yrs)    Types: Cigarettes    Start date: 78    Quit date:  09/19/2018    Years since quitting: 5.2   Smokeless tobacco: Never   Tobacco comments:    uses nicotine gums  Vaping Use   Vaping status: Former  Substance and Sexual Activity   Alcohol use: Yes    Comment: rare   Drug use: No   Sexual activity: Not Currently    Comment: not asked if sexually active  Other Topics Concern   Not on file  Social History Narrative   Not on file   Social Drivers of Health   Financial Resource Strain: Not on file  Food Insecurity: Not on file  Transportation Needs: Not on file  Physical Activity: Not on file  Stress: Not on file  Social Connections: Not on file    Allergies:  Allergies  Allergen Reactions   Effexor Xr [Venlafaxine Hcl]     suicidality   Penicillins Swelling   Amoxicillin-Pot Clavulanate    Keflex [Cephalexin] Hives    Metabolic Disorder Labs: No results found for: "HGBA1C", "MPG" No results found for: "PROLACTIN" Lab Results  Component Value Date   CHOL 146 01/04/2023   TRIG 82 01/04/2023   HDL 61 01/04/2023   LDLCALC 69 01/04/2023   No results found for: "TSH"  Therapeutic Level Labs: No results found for: "LITHIUM" No results found for: "VALPROATE" No results found for: "CBMZ"  Current Medications: Current Outpatient Medications  Medication Sig Dispense Refill   gabapentin (NEURONTIN) 300 MG capsule Take 300 mg by mouth daily.     oxyCODONE (OXY IR/ROXICODONE) 5 MG immediate release tablet Take 10 mg by mouth every 4 (four) hours as needed.     predniSONE (DELTASONE) 10 MG tablet Take 10 mg by mouth daily with breakfast.     senna (SENOKOT) 8.6 MG TABS tablet Take 1-2 tablets by mouth 2 (two) times daily as needed.     aspirin EC 81 MG tablet Take 81 mg by mouth every evening.     Cholecalciferol (VITAMIN D3) 1.25 MG (50000 UT) capsule TAKE 1 CAPSULE BY MOUTH 1 TIME WEEKLY (Patient not taking: Reported on 10/31/2023) 12 capsule 3   clonazePAM (KLONOPIN) 0.5 MG tablet Take 0.5-1 tablets (0.25-0.5 mg total) by  mouth daily as needed for anxiety. Use to only for severe anxiety attacks 21 tablet 0   clopidogrel (PLAVIX) 75 MG tablet TAKE 1 TABLET BY MOUTH EVERY DAY 90 tablet 1   DULoxetine (CYMBALTA) 30  MG capsule Take 1 capsule (30 mg total) by mouth 2 (two) times daily. 180 capsule 1   Glycopyrrolate-Formoterol (BEVESPI AEROSPHERE) 9-4.8 MCG/ACT AERO Inhale 2 puffs into the lungs 2 (two) times daily. 3 each 3   HYDROcodone-acetaminophen (NORCO/VICODIN) 5-325 MG tablet Take 1 tablet by mouth every 8 (eight) hours as needed for moderate pain (pain score 4-6). 60 tablet 0   hydrOXYzine (VISTARIL) 25 MG capsule TAKE 1 CAPSULE BY MOUTH EVERY DAY AS NEEDED FOR ANXIETY AND 2 CAPSULES AT BEDTIME AS NEEDED FOR SLEEP, AS DIRECTED (Patient taking differently: 25 mg as needed. TAKE 1 CAPSULE BY MOUTH EVERY DAY AS NEEDED FOR ANXIETY AND 2 CAPSULES AT BEDTIME AS NEEDED FOR SLEEP, AS DIRECTED) 90 capsule 5   metoprolol succinate (TOPROL-XL) 50 MG 24 hr tablet Take 1 tablet (50 mg total) by mouth daily. 30 tablet 3   Multiple Vitamin (MULTIVITAMIN) tablet Take 1 tablet by mouth 2 (two) times a week. Centrum Silver for Women     nicotine polacrilex (NICORETTE) 4 MG gum Take 4 mg by mouth as needed for smoking cessation.     nitroGLYCERIN (NITROSTAT) 0.4 MG SL tablet Place 0.4 mg under the tongue every 5 (five) minutes x 3 doses as needed for chest pain.     Probiotic Product (PROBIOTIC DAILY PO) Take 1 capsule by mouth every other day.     traZODone (DESYREL) 100 MG tablet Take 1 tablet (100 mg total) by mouth at bedtime. (Patient taking differently: Take 50-100 mg by mouth at bedtime.) 90 tablet 1   No current facility-administered medications for this visit.     Musculoskeletal: Strength & Muscle Tone:  UTA Gait & Station:  Seated Patient leans: N/A  Psychiatric Specialty Exam: Review of Systems  Psychiatric/Behavioral:  The patient is nervous/anxious.     There were no vitals taken for this visit.There is no  height or weight on file to calculate BMI.  General Appearance: Casual  Eye Contact:  Fair  Speech:  Clear and Coherent  Volume:  Normal  Mood:  Anxious coping well  Affect:  Appropriate  Thought Process:  Goal Directed and Descriptions of Associations: Intact  Orientation:  Full (Time, Place, and Person)  Thought Content: Logical   Suicidal Thoughts:  No  Homicidal Thoughts:  No  Memory:  Immediate;   Fair Recent;   Fair Remote;   Fair  Judgement:  Fair  Insight:  Fair  Psychomotor Activity:  Normal  Concentration:  Concentration: Fair and Attention Span: Fair  Recall:  [employer] of Knowledge: Fair  Language: Fair  Akathisia:  No  Handed:  Right  AIMS (if indicated): not done  Assets:  Communication Skills Desire for Improvement Housing Social Support  ADL's:  Intact  Cognition: WNL  Sleep:   Overall good   Screenings: AIMS    Flowsheet Row Video Visit from 02/14/2022 in [facility] Office Visit from 01/16/2022 in [facility]  AIMS Total Score 0 0      GAD-7    Flowsheet Row Video Visit from 09/25/2023 in [facility] Office Visit from 09/05/2022 in [facility] Video Visit from 04/17/2022 in [facility] Video Visit from 02/14/2022 in [facility] Video Visit from 04/26/2021 in [facility]  Total GAD-7 Score 21 3 1 1 1       [id number]  Flowsheet Row Video Visit from 09/25/2023 in [facility] Video Visit from 08/01/2023 in [facility] Video Visit from 02/19/2023 in [facility] Office Visit from 09/05/2022 in [facility] Office Visit from  08/02/2022 in [facility] Total Score 6 0 0 2 0  PHQ-9 Total Score 15 -- -- 5 --      Flowsheet Row Video Visit from 12/30/2023 in [facility] Office Visit from 10/31/2023 in [facility] Admission (Discharged) from 09/30/2023 in [facility]  C-SSRS RISK CATEGORY No Risk No Risk No Risk        Assessment and Plan: Micheale Schlack is a 68 year old Caucasian female, divorced, disabled, lives in [place], has a history of bipolar disorder, GAD, aortic aneurysm status post repair, diverticulitis, recent diagnosis of lung cancer, was evaluated by telemedicine today.  Discussed assessment and plan as noted below.  Generalized anxiety disorder-improving Currently reports anxiety as more manageable.  Does have access to hospice at this time. - Continue Clonazepam 0.25 0.5 mg daily as needed.  Patient advised to limit use.  Discussed drug to drug interaction with oxycodone. - Continue Cymbalta 30 mg twice daily.  Insomnia-improving Currently reports having couple of nights every 2 weeks when she has racing thoughts at night.  Otherwise managing sleep well.  Managing pain with oxycodone and prednisone. - Continue Trazodone 100 mg at bedtime. - Continue Hydroxyzine 25 mg daily as needed and 2 capsules at bedtime as needed. - Not interested in dosage readjustment of trazodone at this time.  Bipolar depression currently in remission Currently denies any significant symptoms of depression . - Continue current medications as prescribed.  Goals of Care Chosen hospice care and has a DNR order. Prefers comfort care over aggressive treatments. Values quality of life and minimal intervention, guiding decision to decline further aggressive treatments. - Encourage discussion of end-of-life preferences with family and  healthcare team  Follow-up - Schedule video visit for March 20th at 3:30 PM - Encourage contact with healthcare provider if there are any changes or concerns before the next visit  Collaboration of Care: Collaboration of Care: Other encouraged to make use of hospice counselor.  Patient/Guardian was advised Release of Information must be obtained prior to any record release in order to collaborate their care with an outside provider. Patient/Guardian was advised if they have not already done so to contact the registration department to sign all necessary forms in order for [place] to release information regarding their care.   Consent: Patient/Guardian gives verbal consent for treatment and assignment of benefits for services provided during this visit. Patient/Guardian expressed understanding and agreed to proceed.   This note was generated in part or whole with voice recognition software. Voice recognition is usually quite accurate but there are transcription errors that can and very often do occur. I apologize for any typographical errors that were not detected and corrected.    Jomarie Longs, MD 12/31/2023, 1:48 PM

## 2024-01-28 ENCOUNTER — Ambulatory Visit: Payer: Medicare HMO | Admitting: Psychiatry

## 2024-01-29 ENCOUNTER — Ambulatory Visit: Payer: Medicare HMO | Admitting: Psychiatry

## 2024-02-04 ENCOUNTER — Encounter: Payer: Medicare HMO | Admitting: Student in an Organized Health Care Education/Training Program

## 2024-02-06 ENCOUNTER — Encounter: Payer: Medicare HMO | Admitting: Student in an Organized Health Care Education/Training Program

## 2024-02-06 ENCOUNTER — Telehealth (INDEPENDENT_AMBULATORY_CARE_PROVIDER_SITE_OTHER): Payer: Medicare HMO | Admitting: Psychiatry

## 2024-02-06 ENCOUNTER — Encounter: Payer: Self-pay | Admitting: Psychiatry

## 2024-02-06 DIAGNOSIS — G4701 Insomnia due to medical condition: Secondary | ICD-10-CM | POA: Diagnosis not present

## 2024-02-06 DIAGNOSIS — F411 Generalized anxiety disorder: Secondary | ICD-10-CM

## 2024-02-06 DIAGNOSIS — F3176 Bipolar disorder, in full remission, most recent episode depressed: Secondary | ICD-10-CM

## 2024-02-06 MED ORDER — TRAZODONE HCL 100 MG PO TABS: 100.0000 mg | ORAL_TABLET | Freq: Every evening | ORAL | 1 refills | Status: DC | PRN

## 2024-02-06 NOTE — Progress Notes (Unsigned)
 Virtual Visit via Video Note  I connected with Melissa Montes on 02/06/24 at  3:40 PM EDT by a video enabled telemedicine application and verified that I am speaking with the correct person using two identifiers.  Location Provider Location : [facility] Patient Location : Home  Participants: Patient , Provider    I discussed the limitations of evaluation and management by telemedicine and the availability of in person appointments. The patient expressed understanding and agreed to proceed.    I discussed the assessment and treatment plan with the patient. The patient was provided an opportunity to ask questions and all were answered. The patient agreed with the plan and demonstrated an understanding of the instructions.   The patient was advised to call back or seek an in-person evaluation if the symptoms worsen or if the condition fails to improve as anticipated.   [facility] MD OP Progress Note  02/07/2024 7:10 AM Melissa Montes  MRN:  [id number]  Chief Complaint:  Chief Complaint  Patient presents with   Follow-up   Anxiety   Medication Refill   Insomnia   HPI: Melissa Montes is a 68 year old Caucasian female on disability, divorced, lives in [place], has a history of GAD, bipolar disorder, insomnia, lung cancer, diverticulitis, abdominal aortic aneurysm, chronic pain was evaluated by telemedicine today.  She was recently diagnosed with lung cancer and has enrolled in hospice care for palliative and comfort management.  She is experiencing persistent fatigue and decreased energy levels, she has been mostly staying home, though she occasionally goes out with her sister. The fatigue has been ongoing since her last appointment in February, significantly limiting her activities.  She continues to experience significant back pain, described as 'pretty bad.' She is taking hydrocodone 15 mg four times a day, every four hours, for pain management, with no side effects such as dizziness  or sedation reported.  Sleep is occasionally disrupted, with difficulty falling asleep about once a week. She is taking trazodone 100 mg nightly. Trazodone sometimes causes morning grogginess.  She mentions emotional fluctuations, describing her mood as 'up and down,' but finds comfort in her sister's presence.  Denies thoughts of self-harm or hallucinations. For anxiety, she prefers hydroxyzine over clonazepam.  Her appetite is poor, but she manages to consume nutritional drinks called [employer], which are high in protein and part of her dietary intake.  She denies any other concerns today.   Visit Diagnosis:    ICD-10-CM   1. GAD (generalized anxiety disorder)  F41.1     2. Bipolar disorder, in full remission, most recent episode depressed (HCC)  F31.76     3. Insomnia due to medical condition  G47.01 traZODone (DESYREL) 100 MG tablet   Pain      Past Psychiatric History: I have reviewed past psychiatric history from progress note on 04/26/2020.  Past Medical History:  Past Medical History:  Diagnosis Date   AAA (abdominal aortic aneurysm) (HCC)    a.) s/p EVAR 12/14/2020   ADD (attention deficit disorder)    a.) followed by psychiatry   Anxiety    Atherosclerosis of abdominal aorta (HCC)    Bilateral carotid artery stenosis    Bipolar disorder (HCC)    a.) followed by psychiatry   CAD (coronary artery disease)    a.) LHC/PCI 09/21/2006: 100pRCA (3.0 x 23 Vision with overlapping 2.5 x 8 mm Mini-Vision d/t sm edge dissection), 10% LM, 10% pLCx, 20% mLAD, 90%D1; b.) LHC 06/17/2008: 20% pRCA, 20% p-mRCA, 30% LM,  305 LCx, 30% pLAD, 30% mLAD, 70% D1 - med mgmt; c.) MV 12/24/2009: no ischemia; d.) LHC 07/07/2018: 30% p-mRCA, 80% mLM --> CVTS; e.) s/p 2v CABG (LIMA-LAD, SVG-OM1) 07/06/2018   Chronic hepatitis (HCC)    Chronic pain syndrome    a.) followed by pain management   Complication of anesthesia    a.) PONV   COPD (chronic obstructive pulmonary disease) (HCC)    DDD  (degenerative disc disease), lumbar    Depression    Diastolic dysfunction    a.) TTE 10/16/20214: EF 70%, no RWMAs, G1DD, normal RVSF, triv TR   Diverticulitis    Dyspnea    History of kidney stones    History of tobacco abuse    Hyperlipidemia    Hypertension    Insomnia    a.) takes trazodone PRN   Long term current use of aspirin    Lumbar spondylosis    Lung cancer (HCC)    Lung nodules    Mediastinal lymphadenopathy    MGUS (monoclonal gammopathy of unknown significance)    Migraine    Myocardial infarct (HCC) 2019   On long term clopidogrel therapy    Osteoporosis    PONV (postoperative nausea and vomiting)    PVD (peripheral vascular disease) (HCC)    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   a.) LIMA-LAD, SVG-OM1   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HCC)    ST elevation myocardial infarction (STEMI) of inferior wall (HCC) 09/21/2006   a.) PCI 09/21/2006: 100% pRCA --> 3.0 x 23 vision with distal edge overlapped by a 2.5 x 8 mm Mini-Vision stent  due to a small edge dissection   SVT (supraventricular tachycardia) (HCC)    Syncope and collapse    Unstable angina (HCC)    Wears partial dentures     Past Surgical History:  Procedure Laterality Date   ABDOMINAL HYSTERECTOMY     CAROTID ANGIOGRAPHY N/A 01/13/2018   Procedure: CAROTID ANGIOGRAPHY;  Surgeon: Annice Needy, MD;  Location: [facility];  Service: Cardiovascular;  Laterality: N/A;   COLONOSCOPY     CORONARY ANGIOPLASTY WITH STENT PLACEMENT Left 09/21/2006   Procedure: CORONARY ANGIOPLASTY WITH STENT PLACEMENT; Location: [facility]; Surgeon: Jerrell Mylar, MD   CORONARY ARTERY BYPASS GRAFT N/A 07/07/2018   Procedure: CORONARY ARTERY BYPASS GRAFT; Location: [facility]; Surgeon: Cherlyn Labella, MD   ENDOBRONCHIAL ULTRASOUND Bilateral 09/30/2023   Procedure: ENDOBRONCHIAL ULTRASOUND;  Surgeon: Janann Colonel, MD;  Location: [facility];  Service: Pulmonary;  Laterality: Bilateral;   ENDOVASCULAR REPAIR/STENT GRAFT Bilateral  12/14/2020   Procedure: ENDOVASCULAR REPAIR/STENT GRAFT;  Surgeon: Annice Needy, MD;  Location: [facility];  Service: Cardiovascular;  Laterality: Bilateral;   iv INJECTIONS TO BACK     LEFT HEART CATH Right 07/07/2018   Procedure: Left Heart Cath and Coronary Angiography;  Surgeon: Laurier Nancy, MD;  Location: [facility];  Service: Cardiovascular;  Laterality: Right;   LEFT HEART CATH AND CORONARY ANGIOGRAPHY Left 06/17/2008   Procedure: LEFT HEART CATH AND CORONARY ANGIOGRAPHY; Location: [facility]; Surgeon: Cherlyn Labella, MD   LEFT HEART CATH AND CORONARY ANGIOGRAPHY Left 07/07/2018   Procedure: LEFT HEART CATH AND CORONARY ANGIOGRAPHY; Location: [facility]    Family Psychiatric History: I have reviewed family psychiatric history from progress note on 04/26/2020.  Family History:  Family History  Problem Relation Age of Onset   Breast cancer Mother 65   Pancreatic cancer Mother    Cirrhosis Father    Depression Sister  Breast cancer Maternal Aunt        mat aunt and great aunt    Social History: I have reviewed social history from progress note on 04/26/2020. Social History   Socioeconomic History   Marital status: Divorced    Spouse name: Not on file   Number of children: Not on file   Years of education: Not on file   Highest education level: Not on file  Occupational History   Occupation: disabled  Tobacco Use   Smoking status: Former    Current packs/day: 0.00    Average packs/day: 1 pack/day for 43.8 years (43.8 ttl pk-yrs)    Types: Cigarettes    Start date: 70    Quit date: 09/19/2018    Years since quitting: 5.3   Smokeless tobacco: Never   Tobacco comments:    uses nicotine gums  Vaping Use   Vaping status: Former  Substance and Sexual Activity   Alcohol use: Yes    Comment: rare   Drug use: No   Sexual activity: Not Currently    Comment: not asked if sexually active  Other Topics Concern   Not on file  Social History Narrative   Not on  file   Social Drivers of Health   Financial Resource Strain: Not on file  Food Insecurity: Not on file  Transportation Needs: Not on file  Physical Activity: Not on file  Stress: Not on file  Social Connections: Not on file    Allergies:  Allergies  Allergen Reactions   Effexor Xr [Venlafaxine Hcl]     suicidality   Penicillins Swelling   Amoxicillin-Pot Clavulanate    Keflex [Cephalexin] Hives    Metabolic Disorder Labs: No results found for: "HGBA1C", "MPG" No results found for: "PROLACTIN" Lab Results  Component Value Date   CHOL 146 01/04/2023   TRIG 82 01/04/2023   HDL 61 01/04/2023   LDLCALC 69 01/04/2023   No results found for: "TSH"  Therapeutic Level Labs: No results found for: "LITHIUM" No results found for: "VALPROATE" No results found for: "CBMZ"  Current Medications: Current Outpatient Medications  Medication Sig Dispense Refill   aspirin EC 81 MG tablet Take 81 mg by mouth every evening.     Cholecalciferol (VITAMIN D3) 1.25 MG (50000 UT) capsule TAKE 1 CAPSULE BY MOUTH 1 TIME WEEKLY (Patient not taking: Reported on 10/31/2023) 12 capsule 3   clonazePAM (KLONOPIN) 0.5 MG tablet Take 0.5-1 tablets (0.25-0.5 mg total) by mouth daily as needed for anxiety. Use to only for severe anxiety attacks 21 tablet 0   clopidogrel (PLAVIX) 75 MG tablet TAKE 1 TABLET BY MOUTH EVERY DAY 90 tablet 1   DULoxetine (CYMBALTA) 30 MG capsule Take 1 capsule (30 mg total) by mouth 2 (two) times daily. 180 capsule 1   gabapentin (NEURONTIN) 300 MG capsule Take 300 mg by mouth daily.     Glycopyrrolate-Formoterol (BEVESPI AEROSPHERE) 9-4.8 MCG/ACT AERO Inhale 2 puffs into the lungs 2 (two) times daily. 3 each 3   HYDROcodone-acetaminophen (NORCO/VICODIN) 5-325 MG tablet Take 1 tablet by mouth every 8 (eight) hours as needed for moderate pain (pain score 4-6). 60 tablet 0   hydrOXYzine (VISTARIL) 25 MG capsule TAKE 1 CAPSULE BY MOUTH EVERY DAY AS NEEDED FOR ANXIETY AND 2 CAPSULES  AT BEDTIME AS NEEDED FOR SLEEP, AS DIRECTED (Patient taking differently: 25 mg as needed. TAKE 1 CAPSULE BY MOUTH EVERY DAY AS NEEDED FOR ANXIETY AND 2 CAPSULES AT BEDTIME AS NEEDED FOR SLEEP, AS DIRECTED)  90 capsule 5   metoprolol succinate (TOPROL-XL) 50 MG 24 hr tablet Take 1 tablet (50 mg total) by mouth daily. 30 tablet 3   Multiple Vitamin (MULTIVITAMIN) tablet Take 1 tablet by mouth 2 (two) times a week. Centrum Silver for Women     nicotine polacrilex (NICORETTE) 4 MG gum Take 4 mg by mouth as needed for smoking cessation.     nitroGLYCERIN (NITROSTAT) 0.4 MG SL tablet Place 0.4 mg under the tongue every 5 (five) minutes x 3 doses as needed for chest pain.     oxyCODONE (OXY IR/ROXICODONE) 5 MG immediate release tablet Take 10 mg by mouth every 4 (four) hours as needed.     predniSONE (DELTASONE) 10 MG tablet Take 10 mg by mouth daily with breakfast.     Probiotic Product (PROBIOTIC DAILY PO) Take 1 capsule by mouth every other day.     senna (SENOKOT) 8.6 MG TABS tablet Take 1-2 tablets by mouth 2 (two) times daily as needed.     traZODone (DESYREL) 100 MG tablet Take 1-2 tablets (100-200 mg total) by mouth at bedtime as needed for sleep. 180 tablet 1   No current facility-administered medications for this visit.     Musculoskeletal: Strength & Muscle Tone:  UTA Gait & Station:  Seated Patient leans: N/A  Psychiatric Specialty Exam: Review of Systems  Psychiatric/Behavioral:  Positive for sleep disturbance. The patient is nervous/anxious.     There were no vitals taken for this visit.There is no height or weight on file to calculate BMI.  General Appearance: Casual  Eye Contact:  Fair  Speech:  Clear and Coherent  Volume:  Normal  Mood:  Anxious  Affect:  Appropriate  Thought Process:  Goal Directed and Descriptions of Associations: Intact  Orientation:  Full (Time, Place, and Person)  Thought Content: Logical   Suicidal Thoughts:  No  Homicidal Thoughts:  No  Memory:   Immediate;   Fair Recent;   Fair Remote;   Fair  Judgement:  Fair  Insight:  Fair  Psychomotor Activity:  Normal  Concentration:  Concentration: Fair and Attention Span: Fair  Recall:  [employer] of Knowledge: Fair  Language: Fair  Akathisia:  No  Handed:  Right  AIMS (if indicated): not done  Assets:  Desire for Improvement Housing Social Support Transportation  ADL's:  Intact  Cognition: WNL  Sleep:   varies   Screenings: AIMS    Flowsheet Row Video Visit from 02/14/2022 in [facility] Office Visit from 01/16/2022 in [facility]  AIMS Total Score 0 0      GAD-7    Flowsheet Row Video Visit from 09/25/2023 in [facility] Office Visit from 09/05/2022 in [facility] Video Visit from 04/17/2022 in [facility] Video Visit from 02/14/2022 in [facility] Video Visit from 04/26/2021 in [facility]  Total GAD-7 Score 21 3 1 1 1       [id number]    Flowsheet Row Video Visit from 09/25/2023 in [facility] Video Visit from 08/01/2023 in [facility] Video Visit from 02/19/2023 in [facility] Office Visit from 09/05/2022 in [facility] Office Visit from 08/02/2022 in [facility] Total Score 6 0 0  2 0  PHQ-9 Total Score 15 -- -- 5 --      Flowsheet Row Video Visit from 02/06/2024 in [facility] Video Visit from 12/30/2023 in [facility] Office Visit from 10/31/2023 in [facility]  C-SSRS RISK CATEGORY No Risk No Risk No Risk        Assessment and Plan: Melissa Montes is a 68 year old Caucasian female, divorced, disabled, lives in [place], has a history of bipolar disorder, GAD, aortic aneurysm status post repair, diverticulitis, recent diagnosis of lung cancer currently with hospice, was evaluated by telemedicine today.  Discussed assessment and plan as noted below.  Insomnia-unstable Chronic insomnia with occasional difficulty initiating sleep despite trazodone 100 mg, which sometimes causes morning grogginess. Previous trials of Ambien, Lunesta, Sonata and Belsomra were unsatisfactory. Consideration of increasing trazodone to 200 mg for flexibility in managing sleep issues and alternative sleep aids if ineffective. - Increase Trazodone with dose flexibility between 100 mg to 200 mg as needed for sleep. - Consider alternative sleep aid if trazodone remains ineffective.  Anxiety-stable Anxiety managed with hydroxyzine, preferred over clonazepam due to perceived strength of clonazepam. - Continue Hydroxyzine 25 mg daily as needed and 50 mg at bedtime as needed for anxiety management. - Continue Cymbalta 30 mg twice daily - Continue Clonazepam 0.25-0.5 mg daily as needed.  She has been limiting use.  Bipolar depression currently in remission Currently denies any significant symptoms of mania/depression. - Continue current medication regimen.  Follow-up Next appointment scheduled to monitor conditions and adjust treatment as necessary. - Schedule follow-up video visit on June 5th at 3:40 PM. - Advise sending MyChart message with any health updates.   Collaboration of Care: Collaboration of Care: Patient refused AEB patient declined referral for psychotherapy does have hospice and can access counseling through them.  She is aware.  Patient/Guardian was advised Release of Information must be obtained prior to any record release in order to  collaborate their care with an outside provider. Patient/Guardian was advised if they have not already done so to contact the registration department to sign all necessary forms in order for [place] to release information regarding their care.   Consent: Patient/Guardian gives verbal consent for treatment and assignment of benefits for services provided during this visit. Patient/Guardian expressed understanding and agreed to proceed.  Discussed the use of a AI scribe software for clinical note transcription with the patient, who gave verbal consent to proceed.  This note was generated in part or whole with voice recognition software. Voice recognition is usually quite accurate but there are transcription errors that can and very often do occur. I apologize for any typographical errors that were not detected and corrected.     Jomarie Longs, MD 02/07/2024, 7:10 AM

## 2024-02-18 ENCOUNTER — Ambulatory Visit (INDEPENDENT_AMBULATORY_CARE_PROVIDER_SITE_OTHER): Payer: Medicare HMO | Admitting: Vascular Surgery

## 2024-02-18 ENCOUNTER — Other Ambulatory Visit (INDEPENDENT_AMBULATORY_CARE_PROVIDER_SITE_OTHER): Payer: Medicare HMO

## 2024-02-18 ENCOUNTER — Encounter (INDEPENDENT_AMBULATORY_CARE_PROVIDER_SITE_OTHER): Payer: Medicare HMO

## 2024-04-07 ENCOUNTER — Encounter (INDEPENDENT_AMBULATORY_CARE_PROVIDER_SITE_OTHER): Payer: Self-pay

## 2024-04-23 ENCOUNTER — Encounter: Payer: Self-pay | Admitting: Psychiatry

## 2024-04-23 ENCOUNTER — Telehealth: Admitting: Psychiatry

## 2024-04-23 DIAGNOSIS — F411 Generalized anxiety disorder: Secondary | ICD-10-CM | POA: Diagnosis not present

## 2024-04-23 DIAGNOSIS — G4701 Insomnia due to medical condition: Secondary | ICD-10-CM

## 2024-04-23 DIAGNOSIS — F3176 Bipolar disorder, in full remission, most recent episode depressed: Secondary | ICD-10-CM

## 2024-04-23 MED ORDER — ALPRAZOLAM 0.25 MG PO TABS: 0.2500 mg | ORAL_TABLET | Freq: Two times a day (BID) | ORAL | 0 refills | Status: AC | PRN

## 2024-04-23 MED ORDER — DULOXETINE HCL 30 MG PO CPEP: 30.0000 mg | ORAL_CAPSULE | Freq: Two times a day (BID) | ORAL | 1 refills | Status: AC

## 2024-04-23 NOTE — Progress Notes (Unsigned)
 Virtual Visit via Video Note  I connected with Melissa Montes on 04/23/24 at  3:40 PM EDT by a video enabled telemedicine application and verified that I am speaking with the correct person using two identifiers.  Location Provider Location : [facility] Patient Location : Home  Participants: Patient , Provider   I discussed the limitations of evaluation and management by telemedicine and the availability of in person appointments. The patient expressed understanding and agreed to proceed.   I discussed the assessment and treatment plan with the patient. The patient was provided an opportunity to ask questions and all were answered. The patient agreed with the plan and demonstrated an understanding of the instructions.   The patient was advised to call back or seek an in-person evaluation if the symptoms worsen or if the condition fails to improve as anticipated.   BH MD OP Progress Note  04/23/2024 3:57 PM Melissa Montes  MRN:  [id number]  Chief Complaint:  Chief Complaint  Patient presents with   Follow-up   Depression   Anxiety   Medication Refill   Discussed the use of AI scribe software for clinical note transcription with the patient, who gave verbal consent to proceed.  History of Present Illness Melissa Montes is a 68 year old Caucasian female on disability, divorced, lives in [place], has a history of GAD, bipolar disorder, insomnia, lung cancer, diverticulitis, abdominal aortic aneurysm, chronic pain was evaluated by telemedicine today.  She experiences significant anxiety on a regular basis.  She continues to be worried about her cancer diagnosis.  She is anxious to know how much her cancer has progressed.  She however reports since she is with hospice they did not do any testing on her anymore.  She has not been using clonazepam  due to sedation effect. She uses hydroxyzine  once or twice a week.  That has been helpful to some extent when she takes it.  She does not  engage with a counselor but talks to her friend, sister, and brother for support.  She reports increased fatigue since her last visit in March, rarely leaving her apartment and feeling exhausted after a recent outing with family. She lives alone with assistance from friends and family for groceries and other needs. 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 She struggles with sleep, describing herself as a 'night owl' with racing thoughts at bedtime.  She experiences severe back and neck pain, for which she takes oxycodone . She has tried morphine  but did not like it and is hesitant about trying methadone. She has not used Zofran , which was prescribed as needed.  She is considering going back to her regular doctor to possibly undergo tests like a PET scan.  She currently denies any suicidality, homicidality or perceptual disturbances.  Visit Diagnosis:    ICD-10-CM   1. GAD (generalized anxiety disorder)  F41.1 ALPRAZolam (XANAX) 0.25 MG tablet    DULoxetine  (CYMBALTA ) 30 MG capsule    2. Bipolar disorder, in full remission, most recent episode depressed (HCC)  F31.76     3. Insomnia due to medical condition  G47.01 ALPRAZolam (XANAX) 0.25 MG tablet      Past Psychiatric History: I have reviewed past psychiatric history from progress note on 04/26/2020.  Past Medical History:  Past Medical History:  Diagnosis Date   AAA (abdominal aortic aneurysm) (HCC)    a.) s/p EVAR 12/14/2020   ADD (attention  deficit disorder)    a.) followed by psychiatry   Anxiety    Atherosclerosis of abdominal aorta (HCC)    Bilateral carotid artery stenosis    Bipolar disorder (HCC)    a.) followed by psychiatry   CAD (coronary artery disease)    a.) LHC/PCI 09/21/2006: 100pRCA (3.0 x 23 Vision with overlapping 2.5 x 8 mm Mini-Vision d/t sm edge dissection), 10% LM, 10% pLCx, 20% mLAD, 90%D1; b.) LHC 06/17/2008: 20%  pRCA, 20% p-mRCA, 30% LM, 305 LCx, 30% pLAD, 30% mLAD, 70% D1 - med mgmt; c.) MV 12/24/2009: no ischemia; d.) LHC 07/07/2018: 30% p-mRCA, 80% mLM --> CVTS; e.) s/p 2v CABG (LIMA-LAD, SVG-OM1) 07/06/2018   Chronic hepatitis (HCC)    Chronic pain syndrome    a.) followed by pain management   Complication of anesthesia    a.) PONV   COPD (chronic obstructive pulmonary disease) (HCC)    DDD (degenerative disc disease), lumbar    Depression    Diastolic dysfunction    a.) TTE 10/16/20214: EF 70%, no RWMAs, G1DD, normal RVSF, triv TR   Diverticulitis    Dyspnea    History of kidney stones    History of tobacco abuse    Hyperlipidemia    Hypertension    Insomnia    a.) takes trazodone  PRN   Long term current use of aspirin     Lumbar spondylosis    Lung cancer (HCC)    Lung nodules    Mediastinal lymphadenopathy    MGUS (monoclonal gammopathy of unknown significance)    Migraine    Myocardial infarct (HCC) 2019   On long term clopidogrel  therapy    Osteoporosis    PONV (postoperative nausea and vomiting)    PVD (peripheral vascular disease) (HCC)    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   a.) LIMA-LAD, SVG-OM1   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HCC)    ST elevation myocardial infarction (STEMI) of inferior wall (HCC) 09/21/2006   a.) PCI 09/21/2006: 100% pRCA --> 3.0 x 23 vision with distal edge overlapped by a 2.5 x 8 mm Mini-Vision stent  due to a small edge dissection   SVT (supraventricular tachycardia) (HCC)    Syncope and collapse    Unstable angina (HCC)    Wears partial dentures     Past Surgical History:  Procedure Laterality Date   ABDOMINAL HYSTERECTOMY     CAROTID ANGIOGRAPHY N/A 01/13/2018   Procedure: CAROTID ANGIOGRAPHY;  Surgeon: Celso College, MD;  Location: [facility];  Service: Cardiovascular;  Laterality: N/A;   COLONOSCOPY     CORONARY ANGIOPLASTY WITH STENT PLACEMENT Left 09/21/2006   Procedure: CORONARY ANGIOPLASTY WITH STENT PLACEMENT; Location: [facility];  Surgeon: Lynnette Saucer, MD   CORONARY ARTERY BYPASS GRAFT N/A 07/07/2018   Procedure: CORONARY ARTERY BYPASS GRAFT; Location: [facility]; Surgeon: Boykin Cable, MD   ENDOBRONCHIAL ULTRASOUND Bilateral 09/30/2023   Procedure: ENDOBRONCHIAL ULTRASOUND;  Surgeon: Annitta Kindler, MD;  Location: [facility];  Service: Pulmonary;  Laterality: Bilateral;   ENDOVASCULAR REPAIR/STENT GRAFT Bilateral 12/14/2020   Procedure: ENDOVASCULAR REPAIR/STENT GRAFT;  Surgeon: Celso College, MD;  Location: [facility];  Service: Cardiovascular;  Laterality: Bilateral;   iv INJECTIONS TO BACK     LEFT HEART CATH Right 07/07/2018   Procedure: Left Heart Cath and Coronary Angiography;  Surgeon: Cherrie Cornwall, MD;  Location: [facility];  Service: Cardiovascular;  Laterality: Right;   LEFT HEART CATH AND CORONARY ANGIOGRAPHY Left 06/17/2008   Procedure: LEFT  HEART CATH AND CORONARY ANGIOGRAPHY; Location: [facility]; Surgeon: Boykin Cable, MD   LEFT HEART CATH AND CORONARY ANGIOGRAPHY Left 07/07/2018   Procedure: LEFT HEART CATH AND CORONARY ANGIOGRAPHY; Location: [facility]    Family Psychiatric History: I have reviewed family psychiatric history from progress note on 04/26/2020.  Family History:  Family History  Problem Relation Age of Onset   Breast cancer Mother 85   Pancreatic cancer Mother    Cirrhosis Father    Depression Sister    Breast cancer Maternal Aunt        mat aunt and great aunt    Social History: I have reviewed social history from progress note on 04/26/2020. Social History   Socioeconomic History   Marital status: Divorced    Spouse name: Not on file   Number of children: Not on file   Years of education: Not on file   Highest education level: Not on file  Occupational History   Occupation: disabled  Tobacco Use   Smoking status: Former    Current packs/day: 0.00    Average packs/day: 1 pack/day for 43.8 years (43.8 ttl pk-yrs)    Types: Cigarettes    Start date: 37    Quit  date: 09/19/2018    Years since quitting: 5.5   Smokeless tobacco: Never   Tobacco comments:    uses nicotine gums  Vaping Use   Vaping status: Former  Substance and Sexual Activity   Alcohol use: Yes    Comment: rare   Drug use: No   Sexual activity: Not Currently    Comment: not asked if sexually active  Other Topics Concern   Not on file  Social History Narrative   Not on file   Social Drivers of Health   Financial Resource Strain: Not on file  Food Insecurity: Not on file  Transportation Needs: Not on file  Physical Activity: Not on file  Stress: Not on file  Social Connections: Not on file    Allergies:  Allergies  Allergen Reactions   Effexor  Xr [Venlafaxine  Hcl]     suicidality   Penicillins Swelling   Amoxicillin-Pot Clavulanate    Keflex [Cephalexin] Hives    Metabolic Disorder Labs: No results found for: "HGBA1C", "MPG" No results found for: "PROLACTIN" Lab Results  Component Value Date   CHOL 146 01/04/2023   TRIG 82 01/04/2023   HDL 61 01/04/2023   LDLCALC 69 01/04/2023   No results found for: "TSH"  Therapeutic Level Labs: No results found for: "LITHIUM" No results found for: "VALPROATE" No results found for: "CBMZ"  Current Medications: Current Outpatient Medications  Medication Sig Dispense Refill   ALPRAZolam (XANAX) 0.25 MG tablet Take 1 tablet (0.25 mg total) by mouth 2 (two) times daily as needed for anxiety. 60 tablet 0   aspirin  EC 81 MG tablet Take 81 mg by mouth every evening.     Cholecalciferol (VITAMIN D3) 1.25 MG (50000 UT) capsule TAKE 1 CAPSULE BY MOUTH 1 TIME WEEKLY (Patient not taking: Reported on 10/31/2023) 12 capsule 3   clopidogrel  (PLAVIX ) 75 MG tablet TAKE 1 TABLET BY MOUTH EVERY DAY 90 tablet 1   DULoxetine  (CYMBALTA ) 30 MG capsule Take 1 capsule (30 mg total) by mouth 2 (two) times daily. 180 capsule 1   gabapentin  (NEURONTIN ) 300 MG capsule Take 300 mg by mouth daily.     Glycopyrrolate -Formoterol (BEVESPI   AEROSPHERE) 9-4.8 MCG/ACT AERO Inhale 2 puffs into the lungs 2 (two) times daily. 3 each 3   HYDROcodone -acetaminophen  (NORCO/VICODIN)  5-325 MG tablet Take 1 tablet by mouth every 8 (eight) hours as needed for moderate pain (pain score 4-6). 60 tablet 0   hydrOXYzine  (VISTARIL ) 25 MG capsule TAKE 1 CAPSULE BY MOUTH EVERY DAY AS NEEDED FOR ANXIETY AND 2 CAPSULES AT BEDTIME AS NEEDED FOR SLEEP, AS DIRECTED (Patient taking differently: 25 mg as needed. TAKE 1 CAPSULE BY MOUTH EVERY DAY AS NEEDED FOR ANXIETY AND 2 CAPSULES AT BEDTIME AS NEEDED FOR SLEEP, AS DIRECTED) 90 capsule 5   metoprolol  succinate (TOPROL -XL) 50 MG 24 hr tablet Take 1 tablet (50 mg total) by mouth daily. 30 tablet 3   Multiple Vitamin (MULTIVITAMIN) tablet Take 1 tablet by mouth 2 (two) times a week. Centrum Silver for Women     nicotine polacrilex (NICORETTE) 4 MG gum Take 4 mg by mouth as needed for smoking cessation.     nitroGLYCERIN  (NITROSTAT ) 0.4 MG SL tablet Place 0.4 mg under the tongue every 5 (five) minutes x 3 doses as needed for chest pain.     oxyCODONE  (OXY IR/ROXICODONE ) 5 MG immediate release tablet Take 10 mg by mouth every 4 (four) hours as needed.     predniSONE (DELTASONE) 10 MG tablet Take 10 mg by mouth daily with breakfast.     Probiotic Product (PROBIOTIC DAILY PO) Take 1 capsule by mouth every other day.     senna (SENOKOT) 8.6 MG TABS tablet Take 1-2 tablets by mouth 2 (two) times daily as needed.     traZODone  (DESYREL ) 100 MG tablet Take 1-2 tablets (100-200 mg total) by mouth at bedtime as needed for sleep. 180 tablet 1   No current facility-administered medications for this visit.     Musculoskeletal: Strength & Muscle Tone: UTA Gait & Station: Seated Patient leans: N/A  Psychiatric Specialty Exam: Review of Systems  Psychiatric/Behavioral:  Positive for sleep disturbance. The patient is nervous/anxious.     There were no vitals taken for this visit.There is no height or weight on file to  calculate BMI.  General Appearance: Casual  Eye Contact:  Fair  Speech:  Clear and Coherent  Volume:  Normal  Mood:  Anxious  Affect:  Congruent  Thought Process:  Goal Directed and Descriptions of Associations: Intact  Orientation:  Full (Time, Place, and Person)  Thought Content: Logical   Suicidal Thoughts:  No  Homicidal Thoughts:  No  Memory:  Immediate;   Fair Recent;   Fair Remote;   Fair  Judgement:  Fair  Insight:  Fair  Psychomotor Activity:  Normal  Concentration:  Concentration: Fair and Attention Span: Fair  Recall:  [employer] of Knowledge: Fair  Language: Fair  Akathisia:  No  Handed:  Right  AIMS (if indicated): not done  Assets:  Communication Skills Desire for Improvement Housing Social Support Transportation  ADL's:  Intact  Cognition: WNL  Sleep:  Poor   Screenings: AIMS    Flowsheet Row Video Visit from 02/14/2022 in [facility] Office Visit from 01/16/2022 in [facility]  AIMS Total Score 0 0      GAD-7    Flowsheet Row Video Visit from 09/25/2023 in [facility] Office Visit from 09/05/2022 in [facility] Video Visit from 04/17/2022 in [facility] Video Visit from 02/14/2022 in [facility] Video Visit from 04/26/2021 in [facility]  Total GAD-7 Score 21  3 1 1 1       PHQ2-9    Flowsheet Row Video Visit from 09/25/2023 in [facility] Video Visit from 08/01/2023 in [facility] Video Visit from 02/19/2023 in [facility] Office Visit from 09/05/2022 in [facility] Office Visit from 08/02/2022 in [facility] Total Score 6 0 0 2 0  PHQ-9 Total Score 15 -- -- 5 --      Flowsheet Row Video Visit from 02/06/2024 in [facility] Video Visit from 12/30/2023 in [facility] Office Visit from 10/31/2023 in [facility]  C-SSRS RISK CATEGORY No Risk No Risk No Risk        Assessment and Plan: Melissa Montes is a 68 year old Caucasian female, divorced, disabled, lives in [place], has a history of bipolar disorder, GAD, aortic aneurysm status post repair, diverticulitis, lung cancer currently in hospice was evaluated by telemedicine today.  Discussed assessment and plan as noted below.  Insomnia-unstable Continues to have insomnia mostly due to pain and racing thoughts. Continue trazodone  100-200 mg at bedtime as needed Will need sufficient pain management. Start Xanax 0.25 mg twice a day as needed, could use at bedtime for sleep if needed.  Generalized anxiety disorder-unstable Currently struggling with anxiety due to her current diagnosis of lung cancer. Start Xanax 0.25 mg twice a day as needed Discontinue clonazepam  for excess sedation during the day Continue Cymbalta  30 mg twice a day Continue hydroxyzine  25 mg daily as needed and 50 mg at bedtime as needed for anxiety  Bipolar depression in remission Currently denies any significant manic or depression symptoms Continue gabapentin  300 mg daily-prescribed for pain which is also a mood stabilizer Continue Cymbalta  as prescribed.  Discussed referral for CBT-patient declines, patient currently with hospice.  Follow-up Follow-up in clinic in 2 months or sooner if needed.     Collaboration of Care: Collaboration of Care: {BH OP Collaboration of Care:[id number]}  Patient/Guardian was advised Release of Information must be obtained prior to any record  release in order to collaborate their care with an outside provider. Patient/Guardian was advised if they have not already done so to contact the registration department to sign all necessary forms in order for us  to release information regarding their care.   Consent: Patient/Guardian gives verbal consent for treatment and assignment of benefits for services provided during this visit. Patient/Guardian expressed understanding and agreed to proceed.    Darold Miley, MD 04/23/2024, 3:57 PM

## 2024-06-29 ENCOUNTER — Telehealth: Admitting: Psychiatry

## 2024-07-17 ENCOUNTER — Ambulatory Visit: Payer: Self-pay | Admitting: Internal Medicine

## 2024-07-17 ENCOUNTER — Encounter: Payer: Self-pay | Admitting: Internal Medicine

## 2024-07-17 VITALS — BP 110/74 | HR 118 | Ht 62.0 in | Wt 109.6 lb

## 2024-07-17 DIAGNOSIS — Z955 Presence of coronary angioplasty implant and graft: Secondary | ICD-10-CM

## 2024-07-17 DIAGNOSIS — I200 Unstable angina: Secondary | ICD-10-CM

## 2024-07-17 DIAGNOSIS — F411 Generalized anxiety disorder: Secondary | ICD-10-CM

## 2024-07-17 DIAGNOSIS — C3431 Malignant neoplasm of lower lobe, right bronchus or lung: Secondary | ICD-10-CM

## 2024-07-17 MED ORDER — NITROGLYCERIN 0.4 MG SL SUBL: 0.4000 mg | SUBLINGUAL_TABLET | SUBLINGUAL | 3 refills | Status: AC | PRN

## 2024-07-17 NOTE — Progress Notes (Signed)
 Established Patient Office Visit  Subjective:  Patient ID: Melissa Montes, female    DOB: February 16, 1956  Age: 68 y.o. MRN: [id number]  Chief Complaint  Patient presents with   Follow-up    1 year follow up    Patient comes in for her visit today.  Not seen since March 2024.  Since then she has been diagnosed with stage IIIb lung cancer in November 2024.  Patient had decided not to get treatment, and since then has been under hospice care.  She complains of right sided chest pain.  Patient also decided to stop all other medications including for her coronary artery disease.  Currently she only takes her pain medication.  However she is anxious and very tearful as she wants to know the extent metastasis at this time.  Her last PET scan was in November 2024.  She would like to see her oncologist and get some more answers.  Will reach out to them. She intermittently gets angina, but does not have any nitroglycerin  or sublingual, will send in a prescription.    No other concerns at this time.   Past Medical History:  Diagnosis Date   AAA (abdominal aortic aneurysm) (HCC)    a.) s/p EVAR 12/14/2020   ADD (attention deficit disorder)    a.) followed by psychiatry   Anxiety    Atherosclerosis of abdominal aorta (HCC)    Bilateral carotid artery stenosis    Bipolar disorder (HCC)    a.) followed by psychiatry   CAD (coronary artery disease)    a.) LHC/PCI 09/21/2006: 100pRCA (3.0 x 23 Vision with overlapping 2.5 x 8 mm Mini-Vision d/t sm edge dissection), 10% LM, 10% pLCx, 20% mLAD, 90%D1; b.) LHC 06/17/2008: 20% pRCA, 20% p-mRCA, 30% LM, 305 LCx, 30% pLAD, 30% mLAD, 70% D1 - med mgmt; c.) MV 12/24/2009: no ischemia; d.) LHC 07/07/2018: 30% p-mRCA, 80% mLM --> CVTS; e.) s/p 2v CABG (LIMA-LAD, SVG-OM1) 07/06/2018   Chronic hepatitis (HCC)    Chronic pain syndrome    a.) followed by pain management   Complication of anesthesia    a.) PONV   COPD (chronic obstructive pulmonary disease)  (HCC)    DDD (degenerative disc disease), lumbar    Depression    Diastolic dysfunction    a.) TTE 10/16/20214: EF 70%, no RWMAs, G1DD, normal RVSF, triv TR   Diverticulitis    Dyspnea    History of kidney stones    History of tobacco abuse    Hyperlipidemia    Hypertension    Insomnia    a.) takes trazodone  PRN   Long term current use of aspirin     Lumbar spondylosis    Lung cancer (HCC)    Lung nodules    Mediastinal lymphadenopathy    MGUS (monoclonal gammopathy of unknown significance)    Migraine    Myocardial infarct (HCC) 2019   On long term clopidogrel  therapy    Osteoporosis    PONV (postoperative nausea and vomiting)    PVD (peripheral vascular disease) (HCC)    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   a.) LIMA-LAD, SVG-OM1   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HCC)    ST elevation myocardial infarction (STEMI) of inferior wall (HCC) 09/21/2006   a.) PCI 09/21/2006: 100% pRCA --> 3.0 x 23 vision with distal edge overlapped by a 2.5 x 8 mm Mini-Vision stent  due to a small edge dissection   SVT (supraventricular tachycardia) (HCC)    Syncope and collapse  Unstable angina (HCC)    Wears partial dentures     Past Surgical History:  Procedure Laterality Date   ABDOMINAL HYSTERECTOMY     CAROTID ANGIOGRAPHY N/A 01/13/2018   Procedure: CAROTID ANGIOGRAPHY;  Surgeon: Marea Selinda RAMAN, MD;  Location: [facility];  Service: Cardiovascular;  Laterality: N/A;   COLONOSCOPY     CORONARY ANGIOPLASTY WITH STENT PLACEMENT Left 09/21/2006   Procedure: CORONARY ANGIOPLASTY WITH STENT PLACEMENT; Location: [facility]; Surgeon: Alm Dais, MD   CORONARY ARTERY BYPASS GRAFT N/A 07/07/2018   Procedure: CORONARY ARTERY BYPASS GRAFT; Location: [facility]; Surgeon: Debby Risk, MD   ENDOBRONCHIAL ULTRASOUND Bilateral 09/30/2023   Procedure: ENDOBRONCHIAL ULTRASOUND;  Surgeon: Malka Domino, MD;  Location: [facility];  Service: Pulmonary;  Laterality: Bilateral;   ENDOVASCULAR REPAIR/STENT GRAFT  Bilateral 12/14/2020   Procedure: ENDOVASCULAR REPAIR/STENT GRAFT;  Surgeon: Marea Selinda RAMAN, MD;  Location: [facility];  Service: Cardiovascular;  Laterality: Bilateral;   iv INJECTIONS TO BACK     LEFT HEART CATH Right 07/07/2018   Procedure: Left Heart Cath and Coronary Angiography;  Surgeon: Fernand Denyse LABOR, MD;  Location: [facility];  Service: Cardiovascular;  Laterality: Right;   LEFT HEART CATH AND CORONARY ANGIOGRAPHY Left 06/17/2008   Procedure: LEFT HEART CATH AND CORONARY ANGIOGRAPHY; Location: [facility]; Surgeon: Debby Risk, MD   LEFT HEART CATH AND CORONARY ANGIOGRAPHY Left 07/07/2018   Procedure: LEFT HEART CATH AND CORONARY ANGIOGRAPHY; Location: [facility]    Social History   Socioeconomic History   Marital status: Divorced    Spouse name: Not on file   Number of children: Not on file   Years of education: Not on file   Highest education level: Not on file  Occupational History   Occupation: disabled  Tobacco Use   Smoking status: Former    Current packs/day: 0.00    Average packs/day: 1 pack/day for 43.8 years (43.8 ttl pk-yrs)    Types: Cigarettes    Start date: 43    Quit date: 09/19/2018    Years since quitting: 5.8   Smokeless tobacco: Never   Tobacco comments:    uses nicotine gums  Vaping Use   Vaping status: Former  Substance and Sexual Activity   Alcohol use: Yes    Comment: rare   Drug use: No   Sexual activity: Not Currently    Comment: not asked if sexually active  Other Topics Concern   Not on file  Social History Narrative   Not on file   Social Drivers of Health   Financial Resource Strain: Not on file  Food Insecurity: Not on file  Transportation Needs: Not on file  Physical Activity: Not on file  Stress: Not on file  Social Connections: Not on file  Intimate Partner Violence: Not on file    Family History  Problem Relation Age of Onset   Breast cancer Mother 76   Pancreatic cancer Mother    Cirrhosis Father     Depression Sister    Breast cancer Maternal Aunt        mat aunt and great aunt    Allergies  Allergen Reactions   Effexor  Xr [Venlafaxine  Hcl]     suicidality   Penicillins Swelling   Amoxicillin-Pot Clavulanate    Keflex [Cephalexin] Hives    Outpatient Medications Prior to Visit  Medication Sig   ALPRAZolam  (XANAX ) 0.25 MG tablet Take 0.25 mg by mouth 2 (two) times daily as needed for anxiety.   DULoxetine  (CYMBALTA ) 30  MG capsule Take 1 capsule (30 mg total) by mouth 2 (two) times daily.   oxycodone  (ROXICODONE ) 30 MG immediate release tablet Take 30 mg by mouth every 4 (four) hours as needed.   traZODone  (DESYREL ) 100 MG tablet Take 1-2 tablets (100-200 mg total) by mouth at bedtime as needed for sleep.   aspirin  EC 81 MG tablet Take 81 mg by mouth every evening. (Patient not taking: Reported on 07/17/2024)   Cholecalciferol (VITAMIN D3) 1.25 MG (50000 UT) capsule TAKE 1 CAPSULE BY MOUTH 1 TIME WEEKLY (Patient not taking: Reported on 07/17/2024)   clopidogrel  (PLAVIX ) 75 MG tablet TAKE 1 TABLET BY MOUTH EVERY DAY (Patient not taking: Reported on 07/17/2024)   gabapentin  (NEURONTIN ) 300 MG capsule Take 300 mg by mouth daily. (Patient not taking: Reported on 07/17/2024)   Glycopyrrolate -Formoterol (BEVESPI  AEROSPHERE) 9-4.8 MCG/ACT AERO Inhale 2 puffs into the lungs 2 (two) times daily. (Patient not taking: Reported on 07/17/2024)   HYDROcodone -acetaminophen  (NORCO/VICODIN) 5-325 MG tablet Take 1 tablet by mouth every 8 (eight) hours as needed for moderate pain (pain score 4-6). (Patient not taking: Reported on 07/17/2024)   hydrOXYzine  (VISTARIL ) 25 MG capsule TAKE 1 CAPSULE BY MOUTH EVERY DAY AS NEEDED FOR ANXIETY AND 2 CAPSULES AT BEDTIME AS NEEDED FOR SLEEP, AS DIRECTED (Patient not taking: Reported on 07/17/2024)   metoprolol  succinate (TOPROL -XL) 50 MG 24 hr tablet Take 1 tablet (50 mg total) by mouth daily. (Patient not taking: Reported on 07/17/2024)   Multiple Vitamin (MULTIVITAMIN)  tablet Take 1 tablet by mouth 2 (two) times a week. Centrum Silver for Women (Patient not taking: Reported on 07/17/2024)   nicotine polacrilex (NICORETTE) 4 MG gum Take 4 mg by mouth as needed for smoking cessation. (Patient not taking: Reported on 07/17/2024)   oxyCODONE  (OXY IR/ROXICODONE ) 5 MG immediate release tablet Take 10 mg by mouth every 4 (four) hours as needed. (Patient not taking: Reported on 07/17/2024)   predniSONE (DELTASONE) 10 MG tablet Take 10 mg by mouth daily with breakfast. (Patient not taking: Reported on 07/17/2024)   Probiotic Product (PROBIOTIC DAILY PO) Take 1 capsule by mouth every other day. (Patient not taking: Reported on 07/17/2024)   senna (SENOKOT) 8.6 MG TABS tablet Take 1-2 tablets by mouth 2 (two) times daily as needed. (Patient not taking: Reported on 07/17/2024)   [DISCONTINUED] nitroGLYCERIN  (NITROSTAT ) 0.4 MG SL tablet Place 0.4 mg under the tongue every 5 (five) minutes x 3 doses as needed for chest pain. (Patient not taking: Reported on 07/17/2024)   No facility-administered medications prior to visit.    Review of Systems  Constitutional:  Positive for malaise/fatigue. Negative for diaphoresis.  HENT:  Negative for sore throat.   Respiratory:  Negative for cough and shortness of breath.   Cardiovascular:  Positive for chest pain and palpitations.  Gastrointestinal:  Positive for diarrhea and nausea. Negative for abdominal pain, heartburn and vomiting.  Neurological:  Negative for dizziness, tingling and headaches.  Psychiatric/Behavioral:  The patient is nervous/anxious.        Objective:   BP 110/74   Pulse (!) 118   Ht 5' 2 (1.575 m)   Wt 109 lb 9.6 oz (49.7 kg)   SpO2 91%   BMI 20.05 kg/m   Vitals:   07/17/24 1357  BP: 110/74  Pulse: (!) 118  Height: 5' 2 (1.575 m)  Weight: 109 lb 9.6 oz (49.7 kg)  SpO2: 91%  BMI (Calculated): 20.04    Physical Exam Vitals and nursing note reviewed.  Constitutional:      Appearance: Normal  appearance.  HENT:     Head: Normocephalic and atraumatic.     Nose: Nose normal.     Mouth/Throat:     Mouth: Mucous membranes are moist.     Pharynx: Oropharynx is clear.  Eyes:     Conjunctiva/sclera: Conjunctivae normal.     Pupils: Pupils are equal, round, and reactive to light.  Cardiovascular:     Rate and Rhythm: Normal rate and regular rhythm.     Pulses: Normal pulses.     Heart sounds: Normal heart sounds. No murmur heard. Pulmonary:     Effort: Pulmonary effort is normal.     Breath sounds: Normal breath sounds. No wheezing.  Abdominal:     General: Bowel sounds are normal.     Palpations: Abdomen is soft.     Tenderness: There is no abdominal tenderness. There is no right CVA tenderness or left CVA tenderness.  Musculoskeletal:        General: Normal range of motion.     Cervical back: Normal range of motion.     Right lower leg: No edema.     Left lower leg: No edema.  Skin:    General: Skin is warm and dry.  Neurological:     General: No focal deficit present.     Mental Status: She is alert and oriented to person, place, and time.  Psychiatric:        Mood and Affect: Mood normal.        Behavior: Behavior normal.      No results found for any visits on 07/17/24.  No results found for this or any previous visit (from the past 2160 hours).    Assessment & Plan:  Patient to continue her current medications.  Will send in a prescription for nitroglycerin .  Will reach out to get an appointment with the oncologist. Problem List Items Addressed This Visit     S/P right coronary artery (RCA) stent placement - Primary   Relevant Medications   nitroGLYCERIN  (NITROSTAT ) 0.4 MG SL tablet   Other Relevant Orders   EKG 12-Lead   GAD (generalized anxiety disorder)   Relevant Medications   ALPRAZolam  (XANAX ) 0.25 MG tablet   nitroGLYCERIN  (NITROSTAT ) 0.4 MG SL tablet   Chest pain   Relevant Orders   EKG 12-Lead   Cancer of lower lobe of right lung (HCC)    Relevant Medications   ALPRAZolam  (XANAX ) 0.25 MG tablet    No follow-ups on file.   Total time spent: 30 minutes  FERNAND FREDY RAMAN, MD  07/17/2024   This document may have been prepared by [facility] Voice Recognition software and as such may include unintentional dictation errors.

## 2024-07-22 ENCOUNTER — Encounter: Payer: Self-pay | Admitting: Internal Medicine

## 2024-07-24 ENCOUNTER — Encounter: Payer: Self-pay | Admitting: Internal Medicine

## 2024-07-24 ENCOUNTER — Telehealth: Payer: Self-pay | Admitting: *Deleted

## 2024-07-24 DIAGNOSIS — C3431 Malignant neoplasm of lower lobe, right bronchus or lung: Secondary | ICD-10-CM

## 2024-07-24 NOTE — Telephone Encounter (Signed)
 Follow up phone call made to patient questioning if she was interested in scheduling follow up CT scan and follow up visit with Dr. KATHEE. Pt did not answer. Message left instructed pt to call back.

## 2024-07-24 NOTE — Telephone Encounter (Signed)
 Pt agreeable to appts. Orders placed. Will call pt with appts once scheduled. Pt prefers Thurs or Fri afternoon appts due to transportation.

## 2024-07-24 NOTE — Telephone Encounter (Signed)
-----   Message from Cindy JONELLE Joe sent at 07/23/2024  4:42 PM EDT ----- Regarding: RE: Melissa Montes- follow up with pt CT chest. With contrast- ----- Message ----- From: Verdene Gills, RN Sent: 07/21/2024   8:48 AM EDT To: Rosaline DELENA Call, RN; Cindy JONELLE Joe DUNCAN Subject: RE: [place]- follow up with pt                  No problem. What kind of CT do you want me to order? I will be happy to coordinate.   Melissa Montes ----- Message ----- From: Joe Cindy JONELLE, MD Sent: 07/17/2024   3:56 PM EDT To: Gills Verdene, RN; Rosaline DELENA Call, RN; And# Subject: Melissa Montes- follow up with pt                      Messg from PCP- Dr.Neelam Fernand- this patient came in for a visit today. Not seen here since her Lung cancer diagnosis. Very anxious and crying , as informed by hospice she cannot have access to any of her providers or testing now. She would really like to see you at least once and get another scan to see extent of spread . Do let her know if that is possible. Thank you.   Melissa Montes- please follow up with pt-- and order CT scan/ and follow up with me-  GB

## 2024-07-24 NOTE — Addendum Note (Signed)
 Addended by: VERDENE GILLS on: 07/24/2024 10:59 AM   Modules accepted: Orders

## 2024-08-06 ENCOUNTER — Encounter: Payer: Self-pay | Admitting: *Deleted

## 2024-08-06 DIAGNOSIS — C3431 Malignant neoplasm of lower lobe, right bronchus or lung: Secondary | ICD-10-CM

## 2024-08-06 NOTE — Progress Notes (Signed)
 Received call from pt stating that she is transitioning out of hospice and requests referral to palliative care with [facility]. Per Sidra, okay to place referral.

## 2024-08-13 ENCOUNTER — Emergency Department: Admission: EM | Admit: 2024-08-13 | Discharge: 2024-08-13 | Discharge status: Against Medical Advice

## 2024-08-13 ENCOUNTER — Telehealth: Admitting: Psychiatry

## 2024-08-13 NOTE — ED Triage Notes (Signed)
 Pt via ems, when attempted to triage pt, pt states dont even check me in, I'm calling a ride, I'm not waiting in the lobby. Encouraged pt to come back if needed

## 2024-08-14 ENCOUNTER — Ambulatory Visit
Admission: RE | Admit: 2024-08-14 | Discharge: 2024-08-14 | Disposition: A | Discharge status: Home / Self Care | Source: Ambulatory Visit | Attending: Internal Medicine | Admitting: Internal Medicine

## 2024-08-14 DIAGNOSIS — C3431 Malignant neoplasm of lower lobe, right bronchus or lung: Secondary | ICD-10-CM | POA: Insufficient documentation

## 2024-08-14 MED ORDER — IOHEXOL 300 MG/ML  SOLN
75.0000 mL | Freq: Once | INTRAMUSCULAR | Status: AC | PRN
  Administered 2024-08-14: 75 mL via INTRAVENOUS

## 2024-08-17 ENCOUNTER — Telehealth: Payer: Self-pay | Admitting: *Deleted

## 2024-08-17 NOTE — Telephone Encounter (Signed)
 Pt called in to request moving 10/8 appt to this week if possible. Pt has completed follow up CT scan and appt with Dr. KATHEE moved to Fri 10/3 at 3:15pm per pt request. Pt states that she has a phone call scheduled with [facility] palliative care on Thurs 10/2. States that she is having increased pain in her back and right side and would like to discuss pain management with Dr. B at her visit.

## 2024-08-20 ENCOUNTER — Telehealth: Payer: Self-pay | Admitting: *Deleted

## 2024-08-20 DIAGNOSIS — C3431 Malignant neoplasm of lower lobe, right bronchus or lung: Secondary | ICD-10-CM

## 2024-08-20 NOTE — Telephone Encounter (Signed)
 Pt called in to request referral to hospice with [facility]. Referral placed. Return call to patient to inform referral has been placed and question if she would like to cancel her appt with Dr. B tomorrow afternoon. Pt stated that she would like to review her CT scan results and ask if she can get any further scans to check for cancer outside of her lungs. Informed that can further discuss with Dr. B at her visit.   At this time, pt requests to hold off referral to hospice until she discusses further imaging with Dr. KATHEE. Referral cancelled at this time and will order once pt ready.   Pt requests order for rolling walker with seat and standard wheelchair. Orders placed. Message left with Mitch at [facility].

## 2024-08-21 ENCOUNTER — Inpatient Hospital Stay: Payer: Self-pay | Attending: Internal Medicine | Admitting: Internal Medicine

## 2024-08-21 ENCOUNTER — Encounter: Payer: Self-pay | Admitting: *Deleted

## 2024-08-21 ENCOUNTER — Encounter: Payer: Self-pay | Admitting: Internal Medicine

## 2024-08-21 VITALS — BP 131/90 | HR 123 | Temp 97.6°F | Resp 18 | Ht 62.0 in | Wt 104.7 lb

## 2024-08-21 DIAGNOSIS — C782 Secondary malignant neoplasm of pleura: Secondary | ICD-10-CM | POA: Insufficient documentation

## 2024-08-21 DIAGNOSIS — C3431 Malignant neoplasm of lower lobe, right bronchus or lung: Secondary | ICD-10-CM | POA: Diagnosis not present

## 2024-08-21 DIAGNOSIS — Z87891 Personal history of nicotine dependence: Secondary | ICD-10-CM | POA: Insufficient documentation

## 2024-08-21 DIAGNOSIS — Z800 Family history of malignant neoplasm of digestive organs: Secondary | ICD-10-CM | POA: Diagnosis not present

## 2024-08-21 DIAGNOSIS — R911 Solitary pulmonary nodule: Secondary | ICD-10-CM | POA: Diagnosis not present

## 2024-08-21 DIAGNOSIS — Z803 Family history of malignant neoplasm of breast: Secondary | ICD-10-CM | POA: Diagnosis not present

## 2024-08-21 DIAGNOSIS — F4321 Adjustment disorder with depressed mood: Secondary | ICD-10-CM | POA: Diagnosis not present

## 2024-08-21 MED ORDER — HYDROMORPHONE HCL 2 MG PO TABS: 2.0000 mg | ORAL_TABLET | ORAL | 0 refills | Status: AC | PRN

## 2024-08-21 NOTE — Progress Notes (Signed)
 CT chest 08/14/24.  C/o rt lung pain, x1 month, getting worse. Pain 8/10. Asking for something stronger for pain. Taking oxy 30 mg q4 hours. Having nausea and vomiting.  Taking zofran , not helping.  She is asking if she needs labs?  Wanting to discuss hospice again.

## 2024-08-21 NOTE — Assessment & Plan Note (Addendum)
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 Associated small subcarinal and right perihilar nodal metastases. Stage IIIB-  # EBUS [Dr.Assaker; pul]:  POSITIVE FOR MALIGNANCY; METASTATIC NON-SMALL CELL CARCINOMA IS PRESENT, FAVOR ADENOCARCINOMA. NGS- QNS-declined repeat Bx.  Tempus-Liquid Bx: negative for any actionable mutations.   # Most recently on hospice-however given the worsening pain CT scan done: That shows progressive disease in the right lung and also hilar/mediastinal adenopathy/small right-sided pleural effusion concerning for metastatic or stage IV disease.   # Long discussion with the patient regarding the pros and cons of repeating further imaging including brain MRI/CT scan abdomen pelvis-however from oncology standpoint-repeating scans will be futile-as it would not change our management options.  Patient concerned also about the logistics and the pain control to get the scans done.  # I would recommend focusing on pain management and reenrollment into hospice rather than any further workup.  After lengthy discussion patient agrees.  I called in Dilaudid  2 mg every 4 hours.  And reenrollment back to hospice.  Patient is agreement with the plan.  # I reviewed the blood work- with the patient in detail; also reviewed the imaging independently [as summarized above]; and with the patient in detail.  Discussed with [facility].  # DISPOSITION: # Hospice referral re: Lung cancer # follow up TBD- Dr.B   Cc; Dr.Neelam Khan/[place].

## 2024-08-21 NOTE — Progress Notes (Signed)
 [facility] CONSULT NOTE  Patient Care Team: Fernand Fredy RAMAN, MD as PCP - General (Internal Medicine) Verdene Gills, RN as Oncology Nurse Navigator Rennie Cindy SAUNDERS, MD as Consulting Physician (Oncology)  CHIEF COMPLAINTS/PURPOSE OF CONSULTATION: Lung cancer  Oncology History Overview Note  IMPRESSION: 1. Lung-RADS 4X, highly suspicious. Additional imaging evaluation or consultation with Pulmonology or Thoracic Surgery recommended. Development of a right lower lobe pleural-based dominant nodule, multiple areas of right-sided pleural thickening, and right infrahilar soft tissue fullness (likely adenopathy). 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 2. Incidental findings, including: Cholelithiasis. Aortic atherosclerosis (ICD10-I70.0) and emphysema (ICD10-J43.9). Left nephrolithiasis.   Cancer of lower lobe of right lung (HCC)  10/06/2023 Initial Diagnosis   Cancer of lower lobe of right lung (HCC)   10/07/2023 Cancer Staging   Staging form: Lung, AJCC 8th Edition - Clinical: Stage IIIB (cT4, cN2, cM0) - Signed by Rennie Cindy SAUNDERS, MD on 10/10/2023     HISTORY OF PRESENTING ILLNESS: Patient ambulating-independently. Accompanied by friend, sarah.   Melissa Montes 68 y.o.  female pleasant patient with a longstanding history of smoking currently quit with severe COPD CAD with stage III lung cancer on [declined therapy]-most recently on hospice is here for a follow up/and review the results of CT scan.  Patient notes to have worsening pain in the right chest wall over the last 1 month.  Patient has been on oxycodone  under hospice care every 4 hours.  However pain is not well-controlled.  Patient also takes Zofran  4 mg as needed not controlling the nausea.  Intermittent headaches but no vision changes no falls.  She continues to have ongoing shortness of breath.  Continues to have poor appetite  possible weight loss.  She states that she has been laying in the bed/couch all day-with extremely poor quality of life.  Review of Systems  Constitutional:  Positive for malaise/fatigue and weight loss. Negative for chills, diaphoresis and fever.  HENT:  Negative for nosebleeds and sore throat.   Eyes:  Negative for double vision.  Respiratory:  Positive for shortness of breath. Negative for cough, hemoptysis, sputum production and wheezing.   Cardiovascular:  Negative for chest pain, palpitations, orthopnea and leg swelling.  Gastrointestinal:  Negative for abdominal pain, blood in stool, constipation, diarrhea, heartburn, melena, nausea and vomiting.  Genitourinary:  Negative for dysuria, frequency and urgency.  Musculoskeletal:  Positive for back pain. Negative for joint pain.  Skin: Negative.  Negative for itching and rash.  Neurological:  Positive for weakness. Negative for dizziness, tingling, focal weakness and headaches.  Endo/Heme/Allergies:  Does not bruise/bleed easily.  Psychiatric/Behavioral:  Negative for depression. The patient is not nervous/anxious and does not have insomnia.     MEDICAL HISTORY:  Past Medical History:  Diagnosis Date   AAA (abdominal aortic aneurysm)    a.) s/p EVAR 12/14/2020   ADD (attention deficit disorder)    a.) followed by psychiatry   Anxiety    Atherosclerosis of abdominal aorta    Bilateral carotid artery stenosis    Bipolar disorder (HCC)    a.) followed by psychiatry   CAD (coronary artery disease)    a.) LHC/PCI 09/21/2006: 100pRCA (3.0 x 23 Vision with overlapping 2.5 x 8 mm Mini-Vision d/t sm edge dissection), 10% LM, 10% pLCx, 20% mLAD, 90%D1; b.) LHC 06/17/2008: 20% pRCA, 20% p-mRCA, 30% LM, 305 LCx, 30% pLAD, 30% mLAD, 70% D1 - med mgmt; c.) MV 12/24/2009: no ischemia; d.) LHC  07/07/2018: 30% p-mRCA, 80% mLM --> CVTS; e.) s/p 2v CABG (LIMA-LAD, SVG-OM1) 07/06/2018   Chronic hepatitis (HCC)    Chronic pain syndrome    a.)  followed by pain management   Complication of anesthesia    a.) PONV   COPD (chronic obstructive pulmonary disease) (HCC)    DDD (degenerative disc disease), lumbar    Depression    Diastolic dysfunction    a.) TTE 10/16/20214: EF 70%, no RWMAs, G1DD, normal RVSF, triv TR   Diverticulitis    Dyspnea    History of kidney stones    History of tobacco abuse    Hyperlipidemia    Hypertension    Insomnia    a.) takes trazodone  PRN   Long term current use of aspirin     Lumbar spondylosis    Lung cancer (HCC)    Lung nodules    Mediastinal lymphadenopathy    MGUS (monoclonal gammopathy of unknown significance)    Migraine    Myocardial infarct (HCC) 2019   On long term clopidogrel  therapy    Osteoporosis    PONV (postoperative nausea and vomiting)    PVD (peripheral vascular disease)    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   a.) LIMA-LAD, SVG-OM1   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HCC)    ST elevation myocardial infarction (STEMI) of inferior wall (HCC) 09/21/2006   a.) PCI 09/21/2006: 100% pRCA --> 3.0 x 23 vision with distal edge overlapped by a 2.5 x 8 mm Mini-Vision stent  due to a small edge dissection   SVT (supraventricular tachycardia)    Syncope and collapse    Unstable angina (HCC)    Wears partial dentures     SURGICAL HISTORY: Past Surgical History:  Procedure Laterality Date   ABDOMINAL HYSTERECTOMY     CAROTID ANGIOGRAPHY N/A 01/13/2018   Procedure: CAROTID ANGIOGRAPHY;  Surgeon: Marea Selinda RAMAN, MD;  Location: [facility];  Service: Cardiovascular;  Laterality: N/A;   COLONOSCOPY     CORONARY ANGIOPLASTY WITH STENT PLACEMENT Left 09/21/2006   Procedure: CORONARY ANGIOPLASTY WITH STENT PLACEMENT; Location: [facility]; Surgeon: Alm Dais, MD   CORONARY ARTERY BYPASS GRAFT N/A 07/07/2018   Procedure: CORONARY ARTERY BYPASS GRAFT; Location: [facility]; Surgeon: Debby Risk, MD   ENDOBRONCHIAL ULTRASOUND Bilateral 09/30/2023   Procedure: ENDOBRONCHIAL ULTRASOUND;  Surgeon:  Malka Domino, MD;  Location: [facility];  Service: Pulmonary;  Laterality: Bilateral;   ENDOVASCULAR STENT GRAFT (AAA) Bilateral 12/14/2020   Procedure: ENDOVASCULAR REPAIR/STENT GRAFT;  Surgeon: Marea Selinda RAMAN, MD;  Location: [facility];  Service: Cardiovascular;  Laterality: Bilateral;   iv INJECTIONS TO BACK     LEFT HEART CATH Right 07/07/2018   Procedure: Left Heart Cath and Coronary Angiography;  Surgeon: Fernand Denyse LABOR, MD;  Location: [facility];  Service: Cardiovascular;  Laterality: Right;   LEFT HEART CATH AND CORONARY ANGIOGRAPHY Left 06/17/2008   Procedure: LEFT HEART CATH AND CORONARY ANGIOGRAPHY; Location: [facility]; Surgeon: Debby Risk, MD   LEFT HEART CATH AND CORONARY ANGIOGRAPHY Left 07/07/2018   Procedure: LEFT HEART CATH AND CORONARY ANGIOGRAPHY; Location: [facility]    SOCIAL HISTORY: Social History   Socioeconomic History   Marital status: Divorced    Spouse name: Not on file   Number of children: Not on file   Years of education: Not on file   Highest education level: Not on file  Occupational History   Occupation: disabled  Tobacco Use   Smoking status: Former    Current packs/day: 0.00  Average packs/day: 1 pack/day for 43.8 years (43.8 ttl pk-yrs)    Types: Cigarettes    Start date: 52    Quit date: 09/19/2018    Years since quitting: 5.9   Smokeless tobacco: Never   Tobacco comments:    uses nicotine gums  Vaping Use   Vaping status: Former  Substance and Sexual Activity   Alcohol use: Yes    Comment: rare   Drug use: No   Sexual activity: Not Currently    Comment: not asked if sexually active  Other Topics Concern   Not on file  Social History Narrative   Not on file   Social Drivers of Health   Financial Resource Strain: Not on file  Food Insecurity: Not on file  Transportation Needs: Not on file  Physical Activity: Not on file  Stress: Not on file  Social Connections: Not on file  Intimate Partner Violence: Not  on file    FAMILY HISTORY: Family History  Problem Relation Age of Onset   Breast cancer Mother 11   Pancreatic cancer Mother    Cirrhosis Father    Depression Sister    Breast cancer Maternal Aunt        mat aunt and great aunt    ALLERGIES:  is allergic to effexor  xr [venlafaxine  hcl], penicillins, amoxicillin-pot clavulanate, and keflex [cephalexin].  MEDICATIONS:  Current Outpatient Medications  Medication Sig Dispense Refill   ALPRAZolam  (XANAX ) 0.25 MG tablet Take 0.25 mg by mouth 2 (two) times daily as needed for anxiety.     DULoxetine  (CYMBALTA ) 30 MG capsule Take 1 capsule (30 mg total) by mouth 2 (two) times daily. 180 capsule 1   HYDROmorphone  (DILAUDID ) 2 MG tablet Take 1 tablet (2 mg total) by mouth every 4 (four) hours as needed for severe pain (pain score 7-10). 45 tablet 0   nitroGLYCERIN  (NITROSTAT ) 0.4 MG SL tablet Place 1 tablet (0.4 mg total) under the tongue every 5 (five) minutes as needed for chest pain. 25 tablet 3   ondansetron  (ZOFRAN ) 4 MG tablet Take 4 mg by mouth every 8 (eight) hours as needed for nausea or vomiting.     traZODone  (DESYREL ) 100 MG tablet Take 1-2 tablets (100-200 mg total) by mouth at bedtime as needed for sleep. 180 tablet 1   No current facility-administered medications for this visit.    PHYSICAL EXAMINATION:   Vitals:   08/21/24 1458  BP: (!) 131/90  Pulse: (!) 123  Resp: 18  Temp: 97.6 F (36.4 C)  SpO2: 92%   Filed Weights   08/21/24 1458  Weight: 104 lb 11.2 oz (47.5 kg)    Physical Exam Vitals and nursing note reviewed.  HENT:     Head: Normocephalic and atraumatic.     Mouth/Throat:     Pharynx: Oropharynx is clear.  Eyes:     Extraocular Movements: Extraocular movements intact.     Pupils: Pupils are equal, round, and reactive to light.  Cardiovascular:     Rate and Rhythm: Normal rate and regular rhythm.  Pulmonary:     Comments: Decreased breath sounds bilaterally.  Abdominal:     Palpations:  Abdomen is soft.  Musculoskeletal:        General: Normal range of motion.     Cervical back: Normal range of motion.  Skin:    General: Skin is warm.  Neurological:     General: No focal deficit present.     Mental Status: She is alert and oriented to  person, place, and time.  Psychiatric:        Behavior: Behavior normal.        Judgment: Judgment normal.     LABORATORY DATA:  I have reviewed the data as listed Lab Results  Component Value Date   WBC 10.6 (H) 09/27/2023   HGB 14.1 09/27/2023   HCT 41.1 09/27/2023   MCV 88.2 09/27/2023   PLT 278 09/27/2023   Recent Labs    09/27/23 1418  NA 136  K 3.9  CL 102  CO2 26  GLUCOSE 107*  BUN 17  CREATININE 0.77  CALCIUM  9.3  GFRNONAA >60    RADIOGRAPHIC STUDIES: I have personally reviewed the radiological images as listed and agreed with the findings in the report. CT Chest W Contrast Result Date: 08/17/2024 CLINICAL DATA:  Non-small cell lung cancer (NSCLC), monitor History of right lower lobe lung cancer.  * Tracking Code: BO * EXAM: CT CHEST WITH CONTRAST TECHNIQUE: Multidetector CT imaging of the chest was performed during intravenous contrast administration. RADIATION DOSE REDUCTION: This exam was performed according to the departmental dose-optimization program which includes automated exposure control, adjustment of the mA and/or kV according to patient size and/or use of iterative reconstruction technique. CONTRAST:  75mL OMNIPAQUE  IOHEXOL  300 MG/ML  SOLN COMPARISON:  PET-CT 09/18/2023.  Chest CT 08/27/2023 and 07/06/2022. FINDINGS: Cardiovascular: No acute vascular findings are demonstrated. There is diffuse atherosclerosis of the aorta, great vessels and coronary arteries status post median sternotomy and CABG. The heart size is normal. There is no pericardial effusion. Mediastinum/Nodes: Progressive mediastinal and right hilar adenopathy, highly concerning for progressive metastatic disease. There is a right hilar node  measuring 2.0 cm on image 103/3 and a subcarinal node measuring 0.9 cm on image 91/3. No axillary adenopathy. The thyroid  gland, trachea and esophagus demonstrate no significant findings. Lungs/Pleura: New small right pleural effusion with new extensive nodular pleural thickening consistent with widespread pleural metastases. Pleural base masses extend along the right heart border and right hemidiaphragm. Representative pleural based masses include a 3.0 x 1.1 cm mass posteromedially on image 149/3. Pleural nodularity extends into the right pleural fissures. Progressive enlargement of previously demonstrated lobulated subpleural nodule in the right lower lobe, now measuring 2.3 x 1.8 cm on image 108/5 (previously 1.1 cm), hypermetabolic on PET-CT and consistent with primary bronchogenic carcinoma. Underlying moderate to severe centrilobular and paraseptal emphysema with mild left lung scarring. No suspicious left pulmonary nodularity or left-sided pleural effusion. Upper abdomen: No acute findings are seen in the visualized upper abdomen. There is no definite evidence of subdiaphragmatic disease. A small cyst peripherally in the right hepatic lobe appears unchanged. Patient is status post aortic stent grafting. Musculoskeletal/Chest wall: There is no chest wall mass or suspicious osseous finding. Mild degenerative changes in the spine. Intact median sternotomy. No evidence of rib destruction or other definite osseous metastatic disease. IMPRESSION: 1. Progressive enlargement of previously demonstrated right lower lobe pulmonary nodule consistent with primary bronchogenic carcinoma. 2. Progressive extensive right-sided pleural metastases with associated small malignant pleural effusion. 3. Progressive mediastinal and right hilar adenopathy consistent with metastatic disease. 4. No evidence of subdiaphragmatic metastatic disease. 5. Aortic Atherosclerosis (ICD10-I70.0) and Emphysema (ICD10-J43.9). Electronically  Signed   By: Elsie Perone M.D.   On: 08/17/2024 14:08     Cancer of lower lobe of right lung (HCC) # OCT 2024- Multifocal pleural metastases/malignancy in the right hemithorax. Associated small subcarinal and right perihilar nodal metastases. Stage IIIB-  # EBUS [Dr.Assaker; pul]:  POSITIVE FOR MALIGNANCY; METASTATIC NON-SMALL CELL CARCINOMA IS PRESENT, FAVOR ADENOCARCINOMA. NGS- QNS-declined repeat Bx.  Tempus-Liquid Bx: negative for any actionable mutations.   # Most recently on hospice-however given the worsening pain CT scan done: That shows progressive disease in the right lung and also hilar/mediastinal adenopathy/small right-sided pleural effusion concerning for metastatic or stage IV disease.   # Long discussion with the patient regarding the pros and cons of repeating further imaging including brain MRI/CT scan abdomen pelvis-however from oncology standpoint-repeating scans will be futile-as it would not change our management options.  Patient concerned also about the logistics and the pain control to get the scans done.  # I would recommend focusing on pain management and reenrollment into hospice rather than any further workup.  After lengthy discussion patient agrees.  I called in Dilaudid  2 mg every 4 hours.  And reenrollment back to hospice.  Patient is agreement with the plan.  # I reviewed the blood work- with the patient in detail; also reviewed the imaging independently [as summarized above]; and with the patient in detail.  Discussed with [facility]  # DISPOSITION: # Hospice referral re: Lung cancer # follow up TBD- Dr.B   Cc; Dr.Neelam Khan/[facility].    Above plan of care was discussed with patient/family in detail.  My contact information was given to the patient/family.     Cindy JONELLE Joe, MD 08/21/2024 3:53 PM

## 2024-08-26 ENCOUNTER — Ambulatory Visit: Admitting: Internal Medicine

## 2024-09-01 ENCOUNTER — Telehealth: Payer: Self-pay | Admitting: Psychiatry

## 2024-09-01 DIAGNOSIS — F411 Generalized anxiety disorder: Secondary | ICD-10-CM

## 2024-09-01 MED ORDER — ALPRAZOLAM 0.25 MG PO TABS
0.2500 mg | ORAL_TABLET | Freq: Two times a day (BID) | ORAL | 1 refills | Status: AC | PRN
Start: 2024-09-01 — End: 2024-10-31

## 2024-09-01 NOTE — Telephone Encounter (Signed)
 I have sent Xanax  to pharmacy at [facility].  She is not due for duloxetine  at this time.  9-month supply, 90 days with 1 refill was sent out in June.

## 2024-09-02 NOTE — Telephone Encounter (Signed)
 Noted

## 2024-09-02 NOTE — Telephone Encounter (Signed)
 Called patients pharmacy spoke to [place] she stated that the last fill date was 06-30-24 and the patient does have a refill on file will call patient to inform

## 2024-09-08 ENCOUNTER — Other Ambulatory Visit: Payer: Self-pay | Admitting: Psychiatry

## 2024-09-08 DIAGNOSIS — G4701 Insomnia due to medical condition: Secondary | ICD-10-CM

## 2024-11-23 ENCOUNTER — Telehealth: Payer: Self-pay | Admitting: Psychiatry

## 2024-11-23 NOTE — Telephone Encounter (Signed)
 I do not see an appointment scheduled for this patient.  Please contact patient and schedule an appointment.  Please check with patient if hospice is currently prescribing medications.  If she is in need of medication refills from us  she will need to schedule an appointment.

## 2024-11-23 NOTE — Telephone Encounter (Signed)
In person if possible
